# Patient Record
Sex: Male | Born: 1955
Health system: Southern US, Community
[De-identification: ages and names within clinical notes are randomized; demographics above are authoritative.]

## PROBLEM LIST (undated history)

## (undated) ENCOUNTER — Ambulatory Visit (HOSPITAL_COMMUNITY): Admission: EM

## (undated) DIAGNOSIS — I10 Essential (primary) hypertension: Secondary | ICD-10-CM

## (undated) DIAGNOSIS — L219 Seborrheic dermatitis, unspecified: Secondary | ICD-10-CM

## (undated) DIAGNOSIS — M545 Low back pain, unspecified: Secondary | ICD-10-CM

## (undated) DIAGNOSIS — M1990 Unspecified osteoarthritis, unspecified site: Secondary | ICD-10-CM

## (undated) DIAGNOSIS — B029 Zoster without complications: Secondary | ICD-10-CM

## (undated) DIAGNOSIS — Z87442 Personal history of urinary calculi: Secondary | ICD-10-CM

## (undated) DIAGNOSIS — M5416 Radiculopathy, lumbar region: Secondary | ICD-10-CM

## (undated) DIAGNOSIS — G894 Chronic pain syndrome: Secondary | ICD-10-CM

## (undated) DIAGNOSIS — M79604 Pain in right leg: Secondary | ICD-10-CM

## (undated) DIAGNOSIS — F1491 Cocaine use, unspecified, in remission: Secondary | ICD-10-CM

## (undated) DIAGNOSIS — E7800 Pure hypercholesterolemia, unspecified: Secondary | ICD-10-CM

## (undated) DIAGNOSIS — Z87898 Personal history of other specified conditions: Secondary | ICD-10-CM

## (undated) DIAGNOSIS — I2510 Atherosclerotic heart disease of native coronary artery without angina pectoris: Secondary | ICD-10-CM

## (undated) HISTORY — PX: KNEE ARTHROSCOPY: SUR90

## (undated) HISTORY — DX: Unspecified osteoarthritis, unspecified site: M19.90

---
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
## 2001-02-03 ENCOUNTER — Emergency Department (HOSPITAL_COMMUNITY): Admission: EM | Admit: 2001-02-03 | Discharge: 2001-02-03 | Payer: Self-pay | Admitting: Emergency Medicine

## 2001-02-21 ENCOUNTER — Emergency Department (HOSPITAL_COMMUNITY): Admission: EM | Admit: 2001-02-21 | Discharge: 2001-02-21 | Payer: Self-pay | Admitting: Emergency Medicine

## 2001-12-11 ENCOUNTER — Emergency Department (HOSPITAL_COMMUNITY): Admission: EM | Admit: 2001-12-11 | Discharge: 2001-12-11 | Payer: Self-pay | Admitting: Emergency Medicine

## 2002-09-28 ENCOUNTER — Emergency Department (HOSPITAL_COMMUNITY): Admission: EM | Admit: 2002-09-28 | Discharge: 2002-09-28 | Payer: Self-pay | Admitting: Emergency Medicine

## 2002-09-30 ENCOUNTER — Emergency Department (HOSPITAL_COMMUNITY): Admission: EM | Admit: 2002-09-30 | Discharge: 2002-09-30 | Payer: Self-pay | Admitting: *Deleted

## 2003-08-22 ENCOUNTER — Emergency Department (HOSPITAL_COMMUNITY): Admission: EM | Admit: 2003-08-22 | Discharge: 2003-08-22 | Payer: Self-pay | Admitting: Emergency Medicine

## 2004-12-31 ENCOUNTER — Emergency Department (HOSPITAL_COMMUNITY): Admission: EM | Admit: 2004-12-31 | Discharge: 2004-12-31 | Payer: Self-pay | Admitting: Emergency Medicine

## 2005-05-15 ENCOUNTER — Emergency Department (HOSPITAL_COMMUNITY): Admission: EM | Admit: 2005-05-15 | Discharge: 2005-05-15 | Payer: Self-pay | Admitting: Emergency Medicine

## 2006-05-23 ENCOUNTER — Emergency Department (HOSPITAL_COMMUNITY): Admission: EM | Admit: 2006-05-23 | Discharge: 2006-05-23 | Payer: Self-pay | Admitting: Emergency Medicine

## 2006-12-24 ENCOUNTER — Emergency Department (HOSPITAL_COMMUNITY): Admission: EM | Admit: 2006-12-24 | Discharge: 2006-12-24 | Payer: Self-pay | Admitting: Emergency Medicine

## 2007-01-11 ENCOUNTER — Emergency Department (HOSPITAL_COMMUNITY): Admission: EM | Admit: 2007-01-11 | Discharge: 2007-01-11 | Payer: Self-pay | Admitting: Emergency Medicine

## 2007-08-24 ENCOUNTER — Emergency Department (HOSPITAL_COMMUNITY): Admission: EM | Admit: 2007-08-24 | Discharge: 2007-08-24 | Payer: Self-pay | Admitting: Emergency Medicine

## 2008-08-06 ENCOUNTER — Ambulatory Visit: Payer: Self-pay | Admitting: Internal Medicine

## 2008-08-08 ENCOUNTER — Ambulatory Visit: Payer: Self-pay | Admitting: Internal Medicine

## 2008-08-25 ENCOUNTER — Ambulatory Visit: Payer: Self-pay | Admitting: Internal Medicine

## 2008-08-25 ENCOUNTER — Encounter: Payer: Self-pay | Admitting: Family Medicine

## 2008-08-25 LAB — CONVERTED CEMR LAB
ALT: 35 units/L (ref 0–53)
AST: 20 units/L (ref 0–37)
Alkaline Phosphatase: 72 units/L (ref 39–117)
Basophils Absolute: 0 10*3/uL (ref 0.0–0.1)
Eosinophils Absolute: 0.2 10*3/uL (ref 0.0–0.7)
Eosinophils Relative: 2 % (ref 0–5)
HCT: 53.4 % — ABNORMAL HIGH (ref 39.0–52.0)
MCV: 89 fL (ref 78.0–100.0)
Neutrophils Relative %: 69 % (ref 43–77)
Platelets: 228 10*3/uL (ref 150–400)
Potassium: 4.2 meq/L (ref 3.5–5.3)
RDW: 14.5 % (ref 11.5–15.5)
Sodium: 138 meq/L (ref 135–145)
Total Bilirubin: 0.3 mg/dL (ref 0.3–1.2)
Total Protein: 7.4 g/dL (ref 6.0–8.3)

## 2008-09-05 ENCOUNTER — Ambulatory Visit: Payer: Self-pay | Admitting: Internal Medicine

## 2008-10-02 ENCOUNTER — Ambulatory Visit: Payer: Self-pay | Admitting: Family Medicine

## 2008-10-07 ENCOUNTER — Ambulatory Visit: Payer: Self-pay | Admitting: Family Medicine

## 2008-10-07 ENCOUNTER — Encounter: Payer: Self-pay | Admitting: Family Medicine

## 2008-10-07 LAB — CONVERTED CEMR LAB
Albumin: 4.5 g/dL (ref 3.5–5.2)
Alkaline Phosphatase: 66 units/L (ref 39–117)
Amphetamine Screen, Ur: NEGATIVE
Barbiturate Quant, Ur: NEGATIVE
CO2: 26 meq/L (ref 19–32)
Calcium: 9.2 mg/dL (ref 8.4–10.5)
Chloride: 106 meq/L (ref 96–112)
Cholesterol: 170 mg/dL (ref 0–200)
Cocaine Metabolites: POSITIVE — AB
Glucose, Bld: 107 mg/dL — ABNORMAL HIGH (ref 70–99)
LDL Cholesterol: 107 mg/dL — ABNORMAL HIGH (ref 0–99)
Potassium: 4.4 meq/L (ref 3.5–5.3)
Sodium: 140 meq/L (ref 135–145)
Total Protein: 6.7 g/dL (ref 6.0–8.3)
Triglycerides: 140 mg/dL (ref <150)

## 2008-10-13 ENCOUNTER — Ambulatory Visit: Payer: Self-pay | Admitting: Family Medicine

## 2008-11-11 ENCOUNTER — Ambulatory Visit: Payer: Self-pay | Admitting: Family Medicine

## 2008-12-04 ENCOUNTER — Emergency Department (HOSPITAL_COMMUNITY): Admission: EM | Admit: 2008-12-04 | Discharge: 2008-12-05 | Payer: Self-pay | Admitting: Emergency Medicine

## 2008-12-09 ENCOUNTER — Ambulatory Visit: Payer: Self-pay | Admitting: Internal Medicine

## 2008-12-16 ENCOUNTER — Ambulatory Visit (HOSPITAL_COMMUNITY): Admission: RE | Admit: 2008-12-16 | Discharge: 2008-12-16 | Payer: Self-pay | Admitting: Internal Medicine

## 2008-12-24 ENCOUNTER — Ambulatory Visit: Payer: Self-pay | Admitting: Internal Medicine

## 2009-04-22 ENCOUNTER — Ambulatory Visit: Payer: Self-pay | Admitting: Family Medicine

## 2009-04-22 ENCOUNTER — Encounter: Payer: Self-pay | Admitting: Family Medicine

## 2009-04-22 LAB — CONVERTED CEMR LAB
Albumin: 4.3 g/dL (ref 3.5–5.2)
CO2: 23 meq/L (ref 19–32)
Glucose, Bld: 106 mg/dL — ABNORMAL HIGH (ref 70–99)
Sodium: 144 meq/L (ref 135–145)
Total Bilirubin: 0.3 mg/dL (ref 0.3–1.2)
Total Protein: 6.5 g/dL (ref 6.0–8.3)

## 2009-08-19 ENCOUNTER — Ambulatory Visit: Payer: Self-pay | Admitting: Internal Medicine

## 2009-09-28 ENCOUNTER — Ambulatory Visit: Payer: Self-pay | Admitting: Internal Medicine

## 2009-09-28 LAB — CONVERTED CEMR LAB
Cholesterol: 190 mg/dL (ref 0–200)
HDL: 42 mg/dL (ref >39)
Potassium: 4.2 meq/L (ref 3.5–5.3)
Sodium: 135 meq/L (ref 135–145)
Total CHOL/HDL Ratio: 4.5
Triglycerides: 153 mg/dL — ABNORMAL HIGH (ref <150)
VLDL: 31 mg/dL (ref 0–40)

## 2009-09-30 ENCOUNTER — Ambulatory Visit: Payer: Self-pay | Admitting: Internal Medicine

## 2010-02-17 ENCOUNTER — Ambulatory Visit: Payer: Self-pay | Admitting: Internal Medicine

## 2010-02-17 LAB — CONVERTED CEMR LAB
BUN: 19 mg/dL (ref 6–23)
Calcium: 9.8 mg/dL (ref 8.4–10.5)
Cholesterol: 207 mg/dL — ABNORMAL HIGH (ref 0–200)
Glucose, Bld: 111 mg/dL — ABNORMAL HIGH (ref 70–99)

## 2010-02-23 ENCOUNTER — Ambulatory Visit: Payer: Self-pay | Admitting: Internal Medicine

## 2010-03-07 ENCOUNTER — Emergency Department (HOSPITAL_COMMUNITY): Admission: EM | Admit: 2010-03-07 | Discharge: 2010-03-07 | Payer: Self-pay | Admitting: Emergency Medicine

## 2010-05-17 ENCOUNTER — Ambulatory Visit: Payer: Self-pay | Admitting: Internal Medicine

## 2010-07-29 ENCOUNTER — Emergency Department (HOSPITAL_COMMUNITY)
Admission: EM | Admit: 2010-07-29 | Discharge: 2010-07-30 | Payer: Self-pay | Source: Home / Self Care | Admitting: Emergency Medicine

## 2010-11-18 ENCOUNTER — Emergency Department (HOSPITAL_COMMUNITY)
Admission: EM | Admit: 2010-11-18 | Discharge: 2010-11-18 | Disposition: A | Discharge status: Home / Self Care | Payer: Medicare Other | Attending: Emergency Medicine | Admitting: Emergency Medicine

## 2010-11-18 DIAGNOSIS — E119 Type 2 diabetes mellitus without complications: Secondary | ICD-10-CM | POA: Insufficient documentation

## 2010-11-18 DIAGNOSIS — Z79899 Other long term (current) drug therapy: Secondary | ICD-10-CM | POA: Insufficient documentation

## 2010-11-18 DIAGNOSIS — J3489 Other specified disorders of nose and nasal sinuses: Secondary | ICD-10-CM | POA: Insufficient documentation

## 2010-11-18 DIAGNOSIS — F17200 Nicotine dependence, unspecified, uncomplicated: Secondary | ICD-10-CM | POA: Insufficient documentation

## 2010-11-18 DIAGNOSIS — M549 Dorsalgia, unspecified: Secondary | ICD-10-CM | POA: Insufficient documentation

## 2010-11-18 DIAGNOSIS — M129 Arthropathy, unspecified: Secondary | ICD-10-CM | POA: Insufficient documentation

## 2010-11-18 DIAGNOSIS — L989 Disorder of the skin and subcutaneous tissue, unspecified: Secondary | ICD-10-CM | POA: Insufficient documentation

## 2010-11-18 DIAGNOSIS — R059 Cough, unspecified: Secondary | ICD-10-CM | POA: Insufficient documentation

## 2010-11-18 DIAGNOSIS — R05 Cough: Secondary | ICD-10-CM | POA: Insufficient documentation

## 2010-11-18 LAB — GLUCOSE, CAPILLARY: Glucose-Capillary: 167 mg/dL — ABNORMAL HIGH (ref 70–99)

## 2010-11-29 ENCOUNTER — Other Ambulatory Visit: Payer: Self-pay | Admitting: Internal Medicine

## 2010-11-29 ENCOUNTER — Ambulatory Visit (HOSPITAL_COMMUNITY)
Admission: RE | Admit: 2010-11-29 | Discharge: 2010-11-29 | Disposition: A | Discharge status: Home / Self Care | Payer: Medicare Other | Source: Ambulatory Visit | Attending: Internal Medicine | Admitting: Internal Medicine

## 2010-11-29 DIAGNOSIS — R0602 Shortness of breath: Secondary | ICD-10-CM | POA: Insufficient documentation

## 2010-11-29 DIAGNOSIS — R059 Cough, unspecified: Secondary | ICD-10-CM | POA: Insufficient documentation

## 2010-11-29 DIAGNOSIS — E119 Type 2 diabetes mellitus without complications: Secondary | ICD-10-CM | POA: Insufficient documentation

## 2010-11-29 DIAGNOSIS — R071 Chest pain on breathing: Secondary | ICD-10-CM | POA: Insufficient documentation

## 2010-11-29 DIAGNOSIS — R05 Cough: Secondary | ICD-10-CM | POA: Insufficient documentation

## 2011-01-04 ENCOUNTER — Emergency Department (HOSPITAL_COMMUNITY): Payer: Medicare Other

## 2011-01-04 ENCOUNTER — Emergency Department (HOSPITAL_COMMUNITY)
Admission: EM | Admit: 2011-01-04 | Discharge: 2011-01-04 | Disposition: A | Discharge status: Home / Self Care | Payer: Medicare Other | Attending: Emergency Medicine | Admitting: Emergency Medicine

## 2011-01-04 DIAGNOSIS — IMO0002 Reserved for concepts with insufficient information to code with codable children: Secondary | ICD-10-CM | POA: Insufficient documentation

## 2011-01-04 DIAGNOSIS — W1809XA Striking against other object with subsequent fall, initial encounter: Secondary | ICD-10-CM | POA: Insufficient documentation

## 2011-01-04 DIAGNOSIS — E119 Type 2 diabetes mellitus without complications: Secondary | ICD-10-CM | POA: Insufficient documentation

## 2011-01-04 DIAGNOSIS — R071 Chest pain on breathing: Secondary | ICD-10-CM | POA: Insufficient documentation

## 2011-01-04 DIAGNOSIS — Z79899 Other long term (current) drug therapy: Secondary | ICD-10-CM | POA: Insufficient documentation

## 2011-01-07 ENCOUNTER — Emergency Department (HOSPITAL_COMMUNITY): Payer: Medicare Other

## 2011-01-07 ENCOUNTER — Emergency Department (HOSPITAL_COMMUNITY)
Admission: EM | Admit: 2011-01-07 | Discharge: 2011-01-07 | Disposition: A | Discharge status: Home / Self Care | Payer: Medicare Other | Attending: Emergency Medicine | Admitting: Emergency Medicine

## 2011-01-07 DIAGNOSIS — W19XXXA Unspecified fall, initial encounter: Secondary | ICD-10-CM | POA: Insufficient documentation

## 2011-01-07 DIAGNOSIS — S20219A Contusion of unspecified front wall of thorax, initial encounter: Secondary | ICD-10-CM | POA: Insufficient documentation

## 2011-01-07 DIAGNOSIS — R059 Cough, unspecified: Secondary | ICD-10-CM | POA: Insufficient documentation

## 2011-01-07 DIAGNOSIS — E119 Type 2 diabetes mellitus without complications: Secondary | ICD-10-CM | POA: Insufficient documentation

## 2011-01-07 DIAGNOSIS — Z79899 Other long term (current) drug therapy: Secondary | ICD-10-CM | POA: Insufficient documentation

## 2011-01-07 DIAGNOSIS — R05 Cough: Secondary | ICD-10-CM | POA: Insufficient documentation

## 2011-01-07 DIAGNOSIS — E669 Obesity, unspecified: Secondary | ICD-10-CM | POA: Insufficient documentation

## 2011-01-07 DIAGNOSIS — J3489 Other specified disorders of nose and nasal sinuses: Secondary | ICD-10-CM | POA: Insufficient documentation

## 2011-01-07 DIAGNOSIS — J40 Bronchitis, not specified as acute or chronic: Secondary | ICD-10-CM | POA: Insufficient documentation

## 2011-01-07 DIAGNOSIS — R071 Chest pain on breathing: Secondary | ICD-10-CM | POA: Insufficient documentation

## 2011-01-07 LAB — CBC
HCT: 47.4 % (ref 39.0–52.0)
Hemoglobin: 15.8 g/dL (ref 13.0–17.0)
RDW: 13.7 % (ref 11.5–15.5)
WBC: 12.7 10*3/uL — ABNORMAL HIGH (ref 4.0–10.5)

## 2011-01-07 LAB — DIFFERENTIAL
Basophils Absolute: 0 10*3/uL (ref 0.0–0.1)
Basophils Relative: 0 % (ref 0–1)
Eosinophils Relative: 1 % (ref 0–5)
Lymphocytes Relative: 9 % — ABNORMAL LOW (ref 12–46)
Neutro Abs: 10.3 10*3/uL — ABNORMAL HIGH (ref 1.7–7.7)

## 2011-01-07 LAB — POCT I-STAT, CHEM 8
Chloride: 107 mEq/L (ref 96–112)
Glucose, Bld: 93 mg/dL (ref 70–99)
HCT: 49 % (ref 39.0–52.0)
Potassium: 4.1 mEq/L (ref 3.5–5.1)
Sodium: 140 mEq/L (ref 135–145)

## 2011-01-08 ENCOUNTER — Emergency Department (HOSPITAL_COMMUNITY)
Admission: EM | Admit: 2011-01-08 | Discharge: 2011-01-09 | Disposition: A | Discharge status: Home / Self Care | Payer: Medicare Other | Attending: Emergency Medicine | Admitting: Emergency Medicine

## 2011-01-08 ENCOUNTER — Emergency Department (HOSPITAL_COMMUNITY): Payer: Medicare Other

## 2011-01-08 DIAGNOSIS — R062 Wheezing: Secondary | ICD-10-CM | POA: Insufficient documentation

## 2011-01-08 DIAGNOSIS — E119 Type 2 diabetes mellitus without complications: Secondary | ICD-10-CM | POA: Insufficient documentation

## 2011-01-08 DIAGNOSIS — R093 Abnormal sputum: Secondary | ICD-10-CM | POA: Insufficient documentation

## 2011-01-08 DIAGNOSIS — R05 Cough: Secondary | ICD-10-CM | POA: Insufficient documentation

## 2011-01-08 DIAGNOSIS — Z79899 Other long term (current) drug therapy: Secondary | ICD-10-CM | POA: Insufficient documentation

## 2011-01-08 DIAGNOSIS — R059 Cough, unspecified: Secondary | ICD-10-CM | POA: Insufficient documentation

## 2011-01-17 ENCOUNTER — Emergency Department (HOSPITAL_COMMUNITY)
Admission: EM | Admit: 2011-01-17 | Discharge: 2011-01-17 | Disposition: A | Discharge status: Home / Self Care | Payer: Medicare Other | Attending: Emergency Medicine | Admitting: Emergency Medicine

## 2011-01-17 DIAGNOSIS — Z79899 Other long term (current) drug therapy: Secondary | ICD-10-CM | POA: Insufficient documentation

## 2011-01-17 DIAGNOSIS — W19XXXA Unspecified fall, initial encounter: Secondary | ICD-10-CM | POA: Insufficient documentation

## 2011-01-17 DIAGNOSIS — S20219A Contusion of unspecified front wall of thorax, initial encounter: Secondary | ICD-10-CM | POA: Insufficient documentation

## 2011-03-21 ENCOUNTER — Emergency Department (HOSPITAL_COMMUNITY): Payer: Medicare Other

## 2011-03-21 ENCOUNTER — Emergency Department (HOSPITAL_COMMUNITY)
Admission: EM | Admit: 2011-03-21 | Discharge: 2011-03-21 | Disposition: A | Discharge status: Home / Self Care | Payer: Medicare Other | Attending: Emergency Medicine | Admitting: Emergency Medicine

## 2011-03-21 DIAGNOSIS — Z79899 Other long term (current) drug therapy: Secondary | ICD-10-CM | POA: Insufficient documentation

## 2011-03-21 DIAGNOSIS — R079 Chest pain, unspecified: Secondary | ICD-10-CM | POA: Insufficient documentation

## 2011-03-21 DIAGNOSIS — E119 Type 2 diabetes mellitus without complications: Secondary | ICD-10-CM | POA: Insufficient documentation

## 2011-04-01 LAB — URINE MICROSCOPIC-ADD ON

## 2011-04-01 LAB — I-STAT 8, (EC8 V) (CONVERTED LAB)
BUN: 15
Bicarbonate: 27.4 — ABNORMAL HIGH
Glucose, Bld: 262 — ABNORMAL HIGH
TCO2: 29
pCO2, Ven: 47.6
pH, Ven: 7.368 — ABNORMAL HIGH

## 2011-04-01 LAB — URINALYSIS, ROUTINE W REFLEX MICROSCOPIC
Leukocytes, UA: NEGATIVE
Nitrite: NEGATIVE
Protein, ur: 30 — AB
Specific Gravity, Urine: 1.034 — ABNORMAL HIGH
Urobilinogen, UA: 0.2

## 2011-04-01 LAB — POCT I-STAT CREATININE: Creatinine, Ser: 1

## 2011-04-08 ENCOUNTER — Emergency Department (HOSPITAL_COMMUNITY): Payer: Medicare Other

## 2011-04-08 ENCOUNTER — Emergency Department (HOSPITAL_COMMUNITY)
Admission: EM | Admit: 2011-04-08 | Discharge: 2011-04-08 | Disposition: A | Discharge status: Home / Self Care | Payer: Medicare Other | Attending: Emergency Medicine | Admitting: Emergency Medicine

## 2011-04-08 DIAGNOSIS — X500XXA Overexertion from strenuous movement or load, initial encounter: Secondary | ICD-10-CM | POA: Insufficient documentation

## 2011-04-08 DIAGNOSIS — Y92009 Unspecified place in unspecified non-institutional (private) residence as the place of occurrence of the external cause: Secondary | ICD-10-CM | POA: Insufficient documentation

## 2011-04-08 DIAGNOSIS — IMO0002 Reserved for concepts with insufficient information to code with codable children: Secondary | ICD-10-CM | POA: Insufficient documentation

## 2011-04-08 DIAGNOSIS — M25569 Pain in unspecified knee: Secondary | ICD-10-CM | POA: Insufficient documentation

## 2011-04-08 DIAGNOSIS — E669 Obesity, unspecified: Secondary | ICD-10-CM | POA: Insufficient documentation

## 2011-04-08 DIAGNOSIS — E119 Type 2 diabetes mellitus without complications: Secondary | ICD-10-CM | POA: Insufficient documentation

## 2011-04-11 LAB — GLUCOSE, CAPILLARY: Glucose-Capillary: 124 mg/dL — ABNORMAL HIGH (ref 70–99)

## 2011-07-21 DIAGNOSIS — M25569 Pain in unspecified knee: Secondary | ICD-10-CM | POA: Diagnosis not present

## 2011-07-25 DIAGNOSIS — E119 Type 2 diabetes mellitus without complications: Secondary | ICD-10-CM | POA: Diagnosis not present

## 2011-07-25 DIAGNOSIS — E669 Obesity, unspecified: Secondary | ICD-10-CM | POA: Diagnosis not present

## 2011-07-25 DIAGNOSIS — J449 Chronic obstructive pulmonary disease, unspecified: Secondary | ICD-10-CM | POA: Diagnosis not present

## 2011-07-25 DIAGNOSIS — R05 Cough: Secondary | ICD-10-CM | POA: Diagnosis not present

## 2011-07-28 DIAGNOSIS — IMO0002 Reserved for concepts with insufficient information to code with codable children: Secondary | ICD-10-CM | POA: Diagnosis not present

## 2011-08-04 DIAGNOSIS — J45909 Unspecified asthma, uncomplicated: Secondary | ICD-10-CM | POA: Diagnosis not present

## 2011-08-04 DIAGNOSIS — E119 Type 2 diabetes mellitus without complications: Secondary | ICD-10-CM | POA: Diagnosis not present

## 2011-08-22 DIAGNOSIS — X58XXXA Exposure to other specified factors, initial encounter: Secondary | ICD-10-CM | POA: Diagnosis not present

## 2011-08-22 DIAGNOSIS — M23305 Other meniscus derangements, unspecified medial meniscus, unspecified knee: Secondary | ICD-10-CM | POA: Diagnosis not present

## 2011-08-22 DIAGNOSIS — Y929 Unspecified place or not applicable: Secondary | ICD-10-CM | POA: Diagnosis not present

## 2011-08-22 DIAGNOSIS — IMO0002 Reserved for concepts with insufficient information to code with codable children: Secondary | ICD-10-CM | POA: Diagnosis not present

## 2011-08-22 DIAGNOSIS — M659 Synovitis and tenosynovitis, unspecified: Secondary | ICD-10-CM | POA: Diagnosis not present

## 2011-09-04 ENCOUNTER — Encounter (HOSPITAL_COMMUNITY): Payer: Self-pay | Admitting: *Deleted

## 2011-09-04 ENCOUNTER — Emergency Department (HOSPITAL_COMMUNITY)
Admission: EM | Admit: 2011-09-04 | Discharge: 2011-09-04 | Disposition: A | Discharge status: Home / Self Care | Payer: Medicare Other | Attending: Emergency Medicine | Admitting: Emergency Medicine

## 2011-09-04 DIAGNOSIS — H579 Unspecified disorder of eye and adnexa: Secondary | ICD-10-CM | POA: Insufficient documentation

## 2011-09-04 DIAGNOSIS — E119 Type 2 diabetes mellitus without complications: Secondary | ICD-10-CM | POA: Insufficient documentation

## 2011-09-04 DIAGNOSIS — H5789 Other specified disorders of eye and adnexa: Secondary | ICD-10-CM

## 2011-09-04 MED ORDER — FLUORESCEIN SODIUM 1 MG OP STRP
ORAL_STRIP | OPHTHALMIC | Status: AC
  Filled 2011-09-04: qty 1

## 2011-09-04 MED ORDER — TETRACAINE HCL 0.5 % OP SOLN
OPHTHALMIC | Status: AC
  Filled 2011-09-04: qty 2

## 2011-09-04 NOTE — Discharge Instructions (Signed)
There are no foreign bodies in your eye.  Please call your eye doctor as soon as possible for a follow up for your irritation.

## 2011-09-04 NOTE — ED Provider Notes (Signed)
I saw and evaluated the patient, reviewed the resident's note and I agree with the findings and plan.   .Face to face Exam:  General:  Awake HEENT:  Atraumatic Resp:  Normal effort Abd:  Nondistended Neuro:No focal weakness Lymph: No adenopathy   Nelia Shi, MD 09/04/11 2248

## 2011-09-04 NOTE — ED Provider Notes (Signed)
History     CSN: [id number]  Arrival date & time 09/04/11  8469   First MD Initiated Contact with Patient 09/04/11 0900      Chief Complaint  Patient presents with  . Eye Problem    right eye irritation    HPI Pt comes in with right eye irritation for 4 days.  He says he feels like there is something in the eye, and he has tried eye drops and tried to wash the eye out, but it has been getting worse, not better. He denies any injury to the eye, and denies itching.   He says that he had this several months ago, and went to [facility], and they said he had a scratch on his eye and prescribed eye drops.  He says the eye drops stung.  He saw an eye doctor about 6 months ago who said he needed reading glasses   Past Medical History  Diagnosis Date  . Diabetes mellitus     Past Surgical History  Procedure Date  . Knee surgrery     No family history on file.  History  Substance Use Topics  . Smoking status: Current Everyday Smoker  . Smokeless tobacco: Not on file  . Alcohol Use: Yes     occ      Review of Systems  Constitutional: Negative for fever.  HENT: Negative for congestion.   Eyes: Positive for pain, discharge and redness. Negative for itching and visual disturbance.  Respiratory: Negative for shortness of breath.   Cardiovascular: Negative for chest pain.  Gastrointestinal: Negative for abdominal pain.  Genitourinary: Negative for dysuria.  Musculoskeletal: Positive for arthralgias.  Skin: Negative for rash.  Neurological: Negative for dizziness.    Allergies  Review of patient's allergies indicates no known allergies.  Home Medications   Current Outpatient Rx  Name Route Sig Dispense Refill  . GLIPIZIDE 10 MG PO TABS Oral Take 10 mg by mouth 2 (two) times daily before a meal.    . METFORMIN HCL 1000 MG PO TABS Oral Take 1,000 mg by mouth 2 (two) times daily with a meal.    . METHOCARBAMOL 500 MG PO TABS Oral Take 500 mg by mouth every 6 (six) hours.     . OXYCODONE-ACETAMINOPHEN 5-325 MG PO TABS Oral Take 1 tablet by mouth every 4 (four) hours as needed. For pain.    Marland Kitchen SALSALATE 500 MG PO TABS Oral Take 500 mg by mouth daily.      BP 142/94  Pulse 75  Temp(Src) 97 F (36.1 C) (Oral)  Resp 18  SpO2 97%  Physical Exam  Constitutional: He appears well-developed and well-nourished. No distress.  HENT:  Head: Normocephalic and atraumatic.  Nose: Nose normal.  Mouth/Throat: Oropharynx is clear and moist.  Eyes: EOM are normal. Pupils are equal, round, and reactive to light. No foreign bodies found. Right conjunctiva is injected. No scleral icterus.  Slit lamp exam:      The right eye shows no fluorescein uptake.       The left eye shows no fluorescein uptake.       Pt has some granulation tissue in medial right eye mucosa.  No stye or other infection noted.     ED Course  Procedures (including critical care time)  Labs Reviewed - No data to display No results found.   1. Eye irritation       MDM  No corneal abrasion or foreign body seen.  Will have pt follow  up with his eye doctor.         Ardyth Gal, MD 09/04/11 1016

## 2011-09-04 NOTE — ED Notes (Signed)
Pt is here with right eye irritation and was seen at [facility] and diagnosed with scratch on eye.  Pt saw [facility] a couple of months ago.  Pt sts irritation restarted and been going on for last 4 days. No vision change.  Pt feels like something is moving around in his eye.  No redness noted and pupils 4 round reactive and brisk

## 2011-10-11 DIAGNOSIS — H04129 Dry eye syndrome of unspecified lacrimal gland: Secondary | ICD-10-CM | POA: Diagnosis not present

## 2011-12-23 DIAGNOSIS — E669 Obesity, unspecified: Secondary | ICD-10-CM | POA: Diagnosis not present

## 2011-12-23 DIAGNOSIS — J45909 Unspecified asthma, uncomplicated: Secondary | ICD-10-CM | POA: Diagnosis not present

## 2011-12-23 DIAGNOSIS — E119 Type 2 diabetes mellitus without complications: Secondary | ICD-10-CM | POA: Diagnosis not present

## 2011-12-23 DIAGNOSIS — F17200 Nicotine dependence, unspecified, uncomplicated: Secondary | ICD-10-CM | POA: Diagnosis not present

## 2012-07-05 ENCOUNTER — Emergency Department (INDEPENDENT_AMBULATORY_CARE_PROVIDER_SITE_OTHER)
Admission: EM | Admit: 2012-07-05 | Discharge: 2012-07-05 | Disposition: A | Discharge status: Home / Self Care | Payer: Medicare Other | Source: Home / Self Care

## 2012-07-05 ENCOUNTER — Encounter (HOSPITAL_COMMUNITY): Payer: Self-pay

## 2012-07-05 DIAGNOSIS — J45909 Unspecified asthma, uncomplicated: Secondary | ICD-10-CM

## 2012-07-05 DIAGNOSIS — M549 Dorsalgia, unspecified: Secondary | ICD-10-CM

## 2012-07-05 DIAGNOSIS — E119 Type 2 diabetes mellitus without complications: Secondary | ICD-10-CM

## 2012-07-05 LAB — HEMOGLOBIN A1C: Hgb A1c MFr Bld: 7.3 % — ABNORMAL HIGH (ref <5.7)

## 2012-07-05 MED ORDER — ALBUTEROL SULFATE HFA 108 (90 BASE) MCG/ACT IN AERS: 2.0000 | INHALATION_SPRAY | Freq: Four times a day (QID) | RESPIRATORY_TRACT | Status: DC | PRN

## 2012-07-05 MED ORDER — FREESTYLE LANCETS MISC: Status: DC

## 2012-07-05 MED ORDER — GLIPIZIDE 10 MG PO TABS: 10.0000 mg | ORAL_TABLET | Freq: Two times a day (BID) | ORAL | Status: DC

## 2012-07-05 MED ORDER — METFORMIN HCL 1000 MG PO TABS: 1000.0000 mg | ORAL_TABLET | Freq: Two times a day (BID) | ORAL | Status: DC

## 2012-07-05 MED ORDER — TEARS RENEWED OP SOLN: 1.0000 [drp] | Freq: Three times a day (TID) | OPHTHALMIC | Status: DC | PRN

## 2012-07-05 MED ORDER — TRAMADOL HCL 50 MG PO TABS: 50.0000 mg | ORAL_TABLET | Freq: Three times a day (TID) | ORAL | Status: DC | PRN

## 2012-07-05 MED ORDER — GLUCOSE BLOOD VI STRP: ORAL_STRIP | Status: DC

## 2012-07-05 MED ORDER — ACETAMINOPHEN 500 MG PO TABS: 500.0000 mg | ORAL_TABLET | Freq: Four times a day (QID) | ORAL | Status: DC | PRN

## 2012-07-05 NOTE — ED Provider Notes (Signed)
History     CSN: [id number]  Arrival date & time 07/05/12  1225   First MD Initiated Contact with Patient 07/05/12 1358      Chief Complaint  Patient presents with  . Medication Refill    (Consider location/radiation/quality/duration/timing/severity/associated sxs/prior treatment) HPI Patient came to the clinic today for medication refill. Patient has chronic back pain and has been taking tramadol and salsalate. He says he has been taking salsalate for 2 years. Also he has been taking ketorolac ophthalmic for last 2 years for scar in the eye. Patient was seen by ophthalmologist 6 months ago at that time he was told that he does not have any eye problem.  Past Medical History  Diagnosis Date  . Diabetes mellitus     Past Surgical History  Procedure Date  . Knee surgrery     No family history on file.  History  Substance Use Topics  . Smoking status: Current Every Day Smoker  . Smokeless tobacco: Not on file  . Alcohol Use: Yes     Comment: occ      Review of Systems   Review of Systems:  HEENT: Denies  blurred vision, runny nose, sore throat,  Neck: Denies thyroid problems,lymphadenopathy Chest : Denies shortness of breath, no history of COPD Heart : Denies Chest pain,  coronary arterey disease            Allergies  Review of patient's allergies indicates no known allergies.  Home Medications   Current Outpatient Rx  Name  Route  Sig  Dispense  Refill  . METHOCARBAMOL 500 MG PO TABS   Oral   Take 500 mg by mouth every 6 (six) hours.         . ACETAMINOPHEN 500 MG PO TABS   Oral   Take 1 tablet (500 mg total) by mouth every 6 (six) hours as needed for pain.   30 tablet   2   . ALBUTEROL SULFATE HFA 108 (90 BASE) MCG/ACT IN AERS   Inhalation   Inhale 2 puffs into the lungs every 6 (six) hours as needed for wheezing.   1 Inhaler   2   . GLIPIZIDE 10 MG PO TABS   Oral   Take 1 tablet (10 mg total) by mouth 2 (two) times daily before a  meal.   60 tablet   3   . METFORMIN HCL 1000 MG PO TABS   Oral   Take 1 tablet (1,000 mg total) by mouth 2 (two) times daily with a meal.   60 tablet   2   . TRAMADOL HCL 50 MG PO TABS   Oral   Take 1 tablet (50 mg total) by mouth 3 (three) times daily as needed.   30 tablet   1     BP 101/81  Pulse 72  Temp 97.4 F (36.3 C) (Oral)  Resp 18  SpO2 99%  Physical Exam    Constitutional:   Patient is  in no acute distress and cooperative with exam. Head: Normocephalic and atraumatic Mouth: Mucus membranes moist Eyes: PERRL, EOMI, conjunctivae normal Neck: Supple, No Thyromegaly Cardiovascular: RRR, S1 normal, S2 normal Pulmonary/Chest: CTAB, no wheezes, rales, or rhonchi   ED Course  Procedures (including critical care time)  Labs Reviewed - No data to display No results found.   1. Back pain   2. Diabetes mellitus   3. Asthma     back pain We'll give the prescription for tramadol. Will DC salsalate as  it can cause long-term GI side effects. Patient will be given prescription for Tylenol when necessary  Diabetes mellitus Will check hemoglobin A1c We'll give him prescriptions for metformin and glipizide  Eye scar Patient has been taking long-term ketorolac ophthalmic. His ophthalmologist did not recommend him take that, so I will discontinue that at this time Prescribing artificial tears   MDM

## 2012-07-05 NOTE — ED Notes (Signed)
Former [facility] client- i need of medication refills also complains of having head knee surgery 6 months ago now complains of having pain

## 2012-11-22 ENCOUNTER — Encounter: Payer: Self-pay | Admitting: Family Medicine

## 2012-11-22 ENCOUNTER — Ambulatory Visit: Payer: Medicare Other | Attending: Family Medicine | Admitting: Family Medicine

## 2012-11-22 VITALS — BP 127/79 | HR 73 | Temp 98.4°F | Ht 72.0 in | Wt 237.0 lb

## 2012-11-22 DIAGNOSIS — K029 Dental caries, unspecified: Secondary | ICD-10-CM

## 2012-11-22 DIAGNOSIS — G894 Chronic pain syndrome: Secondary | ICD-10-CM | POA: Diagnosis not present

## 2012-11-22 DIAGNOSIS — I10 Essential (primary) hypertension: Secondary | ICD-10-CM | POA: Insufficient documentation

## 2012-11-22 DIAGNOSIS — F17200 Nicotine dependence, unspecified, uncomplicated: Secondary | ICD-10-CM | POA: Diagnosis not present

## 2012-11-22 DIAGNOSIS — IMO0001 Reserved for inherently not codable concepts without codable children: Secondary | ICD-10-CM

## 2012-11-22 DIAGNOSIS — E119 Type 2 diabetes mellitus without complications: Secondary | ICD-10-CM | POA: Insufficient documentation

## 2012-11-22 LAB — COMPREHENSIVE METABOLIC PANEL
Alkaline Phosphatase: 85 U/L (ref 39–117)
BUN: 22 mg/dL (ref 6–23)
Glucose, Bld: 205 mg/dL — ABNORMAL HIGH (ref 70–99)
Sodium: 140 mEq/L (ref 135–145)
Total Bilirubin: 0.3 mg/dL (ref 0.3–1.2)

## 2012-11-22 LAB — LIPID PANEL
HDL: 35 mg/dL — ABNORMAL LOW (ref >39)
Triglycerides: 481 mg/dL — ABNORMAL HIGH (ref <150)

## 2012-11-22 MED ORDER — TRAMADOL HCL 50 MG PO TABS: 50.0000 mg | ORAL_TABLET | Freq: Three times a day (TID) | ORAL | Status: DC | PRN

## 2012-11-22 MED ORDER — GLIPIZIDE 10 MG PO TABS: 10.0000 mg | ORAL_TABLET | Freq: Two times a day (BID) | ORAL | Status: DC

## 2012-11-22 MED ORDER — METFORMIN HCL 1000 MG PO TABS: 1000.0000 mg | ORAL_TABLET | Freq: Two times a day (BID) | ORAL | Status: DC

## 2012-11-22 NOTE — Patient Instructions (Signed)
Chronic Pain Chronic pain can be defined as pain that is lasting, off and on, and lasts for 3 to 6 months or longer. Many things cause chronic pain, which can make it difficult to make a discrete diagnosis. There are many treatment options available for chronic pain. However, finding a treatment that works well for you may require trying various approaches until a suitable one is found. CAUSES  In some types of chronic medical conditions, the pain is caused by a normal pain response within the body. A normal pain response helps the body identify illness or injury and prevent further damage from being done. In these cases, the cause of the pain may be identified and treated, even if it may not be cured completely. Examples of chronic conditions which can cause chronic pain include:  Inflammation of the joints (arthritis).  Back pain or neck pain (including bulging or herniated disks).  Migraine headaches.  Cancer. In some other types of chronic pain syndromes, the pain is caused by an abnormal pain response within the body. An abnormal pain response is present when there is no ongoing cause (or stimulus) for the pain, or when the cause of the pain is arising from the nerves or nervous system itself. Examples of conditions which can cause chronic pain due to an abnormal pain response include:  Fibromyalgia.  Reflex sympathetic dystrophy (RSD).  Neuropathy (when the nerves themselves are damaged, and may cause pain). DIAGNOSIS  Your caregiver will help diagnose your condition over time. In many cases, the initial focus will be on excluding conditions that could be causing the pain. Depending on your symptoms, your caregiver may order some tests to diagnose your condition. Some of these tests include:  Blood tests.  Computerized X-ray scans (CT scan).  Computerized magnetic scans (MRI).  X-rays.  Ultrasounds.  Nerve conduction studies.  Consultation with other physicians or  specialists. TREATMENT  There are many treatment options for people suffering from chronic pain. Finding a treatment that works well may take time.   You may be referred to a pain management specialist.  You may be put on medication to help with the pain. Unfortunately, some medications (such as opiate medications) may not be very effective in cases where chronic pain is due to abnormal pain responses. Finding the right medications can take some time.  Adjunctive therapies may be used to provide additional relief and improve a patient's quality of life. These therapies include:  Mindfulness meditation.  Acupuncture.  Biofeedback.  Cognitive-behavioral therapy.  In certain cases, surgical interventions may be attempted. HOME CARE INSTRUCTIONS   Make sure you understand these instructions prior to discharge.  Ask any questions and share any further concerns you have with your caregiver prior to discharge.  Take all medications as directed by your caregiver.  Keep all follow-up appointments. SEEK MEDICAL CARE IF:   Your pain gets worse.  You develop a new pain that was not present before.  You cannot tolerate any medications prescribed by your caregiver.  You develop new symptoms since your last visit with your caregiver. SEEK IMMEDIATE MEDICAL CARE IF:   You develop muscular weakness.  You have decreased sensation or numbness.  You lose control of bowel or bladder function.  Your pain suddenly gets much worse.  You have an oral temperature above 102 F (38.9 C), not controlled by medication.  You develop shaking chills, confusion, chest pain, or shortness of breath. Document Released: 03/19/2002 Document Revised: 09/19/2011 Document Reviewed: 06/25/2008 [employer] Patient Information  [address], [place]. Blood Sugar Monitoring, Adult GLUCOSE METERS FOR SELF-MONITORING OF BLOOD GLUCOSE  It is important to be able to correctly measure your blood sugar (glucose). You  can use a blood glucose monitor (a small battery-operated device) to check your glucose level at any time. This allows you and your caregiver to monitor your diabetes and to determine how well your treatment plan is working. The process of monitoring your blood glucose with a glucose meter is called self-monitoring of blood glucose (SMBG). When people with diabetes control their blood sugar, they have better health. To test for glucose with a typical glucose meter, place the disposable strip in the meter. Then place a small sample of blood on the "test strip." The test strip is coated with chemicals that combine with glucose in blood. The meter measures how much glucose is present. The meter displays the glucose level as a number. Several new models can record and store a number of test results. Some models can connect to personal computers to store test results or print them out.  Newer meters are often easier to use than older models. Some meters allow you to get blood from places other than your fingertip. Some new models have automatic timing, error codes, signals, or barcode readers to help with proper adjustment (calibration). Some meters have a large display screen or spoken instructions for people with visual impairments.  INSTRUCTIONS FOR USING GLUCOSE METERS  Wash your hands with soap and warm water, or clean the area with alcohol. Dry your hands completely.  Prick the side of your fingertip with a lancet (a sharp-pointed tool used by hand).  Hold the hand down and gently milk the finger until a small drop of blood appears. Catch the blood with the test strip.  Follow the instructions for inserting the test strip and using the SMBG meter. Most meters require the meter to be turned on and the test strip to be inserted before applying the blood sample.  Record the test result.  Read the instructions carefully for both the meter and the test strips that go with it. Meter instructions are found  in the user manual. Keep this manual to help you solve any problems that may arise. Many meters use "error codes" when there is a problem with the meter, the test strip, or the blood sample on the strip. You will need the manual to understand these error codes and fix the problem.  New devices are available such as laser lancets and meters that can test blood taken from "alternative sites" of the body, other than fingertips. However, you should use standard fingertip testing if your glucose changes rapidly. Also, use standard testing if:  You have eaten, exercised, or taken insulin in the past 2 hours.  You think your glucose is low.  You tend to not feel symptoms of low blood glucose (hypoglycemia).  You are ill or under stress.  Clean the meter as directed by the manufacturer.  Test the meter for accuracy as directed by the manufacturer.  Take your meter with you to your caregiver's office. This way, you can test your glucose in front of your caregiver to make sure you are using the meter correctly. Your caregiver can also take a sample of blood to test using a routine lab method. If values on the glucose meter are close to the lab results, you and your caregiver will see that your meter is working well and you are using good technique. Your caregiver will  advise you about what to do if the results do not match. FREQUENCY OF TESTING  Your caregiver will tell you how often you should check your blood glucose. This will depend on your type of diabetes, your current level of diabetes control, and your types of medicines. The following are general guidelines, but your care plan may be different. Record all your readings and the time of day you took them for review with your caregiver.   Diabetes type 1.  When you are using insulin with good diabetic control (either multiple daily injections or via a pump), you should check your glucose 4 times a day.  If your diabetes is not well controlled,  you may need to monitor more frequently, including before meals and 2 hours after meals, at bedtime, and occasionally between 2 a.m. and 3 a.m.  You should always check your glucose before a dose of insulin or before changing the rate on your insulin pump.  Diabetes type 2.  Guidelines for SMBG in diabetes type 2 are not as well defined.  If you are on insulin, follow the guidelines above.  If you are on medicines, but not insulin, and your glucose is not well controlled, you should test at least twice daily.  If you are not on insulin, and your diabetes is controlled with medicines or diet alone, you should test at least once daily, usually before breakfast.  A weekly profile will help your caregiver advise you on your care plan. The week before your visit, check your glucose before a meal and 2 hours after a meal at least daily. You may want to test before and after a different meal each day so you and your caregiver can tell how well controlled your blood sugars are throughout the course of a 24 hour period.  Gestational diabetes (diabetes during pregnancy).  Frequent testing is often necessary. Accurate timing is important.  If you are not on insulin, check your glucose 4 times a day. Check it before breakfast and 1 hour after the start of each meal.  If you are on insulin, check your glucose 6 times a day. Check it before each meal and 1 hour after the first bite of each meal.  General guidelines.  More frequent testing is required at the start of insulin treatment. Your caregiver will instruct you.  Test your glucose any time you suspect you have low blood sugar (hypoglycemia).  You should test more often when you change medicines, when you have unusual stress or illness, or in other unusual circumstances. OTHER THINGS TO KNOW ABOUT GLUCOSE METERS  Measurement Range. Most glucose meters are able to read glucose levels over a broad range of values from as low as 0 to as high as  600 mg/dL. If you get an extremely high or low reading from your meter, you should first confirm it with another reading. Report very high or very low readings to your caregiver.  Whole Blood Glucose versus Plasma Glucose. Some older home glucose meters measure glucose in your whole blood. In a lab or when using some newer home glucose meters, the glucose is measured in your plasma (one component of blood). The difference can be important. It is important for you and your caregiver to know whether your meter gives its results as "whole blood equivalent" or "plasma equivalent."  Display of High and Low Glucose Values. Part of learning how to operate a meter is understanding what the meter results mean. Know how high and low  glucose concentrations are displayed on your meter.  Factors that Affect Glucose Meter Performance. The accuracy of your test results depends on many factors and varies depending on the brand and type of meter. These factors include:  Low red blood cell count (anemia).  Substances in your blood (such as uric acid, vitamin C, and others).  Environmental factors (temperature, humidity, altitude).  Name-brand versus generic test strips.  Calibration. Make sure your meter is set up properly. It is a good idea to do a calibration test with a control solution recommended by the manufacturer of your meter whenever you begin using a fresh bottle of test strips. This will help verify the accuracy of your meter.  Improperly stored, expired, or defective test strips. Keep your strips in a dry place with the lid on.  Soiled meter.  Inadequate blood sample. NEW TECHNOLOGIES FOR GLUCOSE TESTING Alternative site testing Some glucose meters allow testing blood from alternative sites. These include the:  Upper arm.  Forearm.  Base of the thumb.  Thigh. Sampling blood from alternative sites may be desirable. However, it may have some limitations. Blood in the fingertips show  changes in glucose levels more quickly than blood in other parts of the body. This means that alternative site test results may be different from fingertip test results, not because of the meter's ability to test accurately, but because the actual glucose concentration can be different.  Continuous Glucose Monitoring Devices to measure your blood glucose continuously are available, and others are in development. These methods can be more expensive than self-monitoring with a glucose meter. However, it is uncertain how effective and reliable these devices are. Your caregiver will advise you if this approach makes sense for you. IF BLOOD SUGARS ARE CONTROLLED, PEOPLE WITH DIABETES REMAIN HEALTHIER.  SMBG is an important part of the treatment plan of patients with diabetes mellitus. Below are reasons for using SMBG:   It confirms that your glucose is at a specific, healthy level.  It detects hypoglycemia and severe hyperglycemia.  It allows you and your caregiver to make adjustments in response to changes in lifestyle for individuals requiring medicine.  It determines the need for starting insulin therapy in temporary diabetes that happens during pregnancy (gestational diabetes). Document Released: 06/30/2003 Document Revised: 09/19/2011 Document Reviewed: 10/21/2010 [facility] Patient Information 2013 [place], [place].

## 2012-11-22 NOTE — Progress Notes (Signed)
Patient ID: Ryan Farmer, male   DOB: 1956-06-26, 57 y.o.   MRN: [id number] CC: follow up    HPI: Pt presenting for follow up and for diabetes check up. Pt says that BS 80-300.  No complaints except that pain is not controlled with tramadol once per day.  He says he normally takes it 3 times per day to control his pain and has done this for a long time.   No Known Allergies Past Medical History  Diagnosis Date  . Diabetes mellitus   . Arthritis    Current Outpatient Prescriptions on File Prior to Visit  Medication Sig Dispense Refill  . acetaminophen (TYLENOL) 500 MG tablet Take 1 tablet (500 mg total) by mouth every 6 (six) hours as needed for pain.  30 tablet  2  . albuterol (PROVENTIL HFA;VENTOLIN HFA) 108 (90 BASE) MCG/ACT inhaler Inhale 2 puffs into the lungs every 6 (six) hours as needed for wheezing.  1 Inhaler  2  . dextran 70-hypromellose (TEARS RENEWED) ophthalmic solution Place 1 drop into both eyes 3 (three) times daily as needed.  15 mL  3  . glipiZIDE (GLUCOTROL) 10 MG tablet Take 1 tablet (10 mg total) by mouth 2 (two) times daily before a meal.  60 tablet  3  . glucose blood (CHOICE DM FORA G20 TEST STRIPS) test strip Use as instructed  100 each  12  . Lancets (FREESTYLE) lancets Use as instructed  100 each  12  . metFORMIN (GLUCOPHAGE) 1000 MG tablet Take 1 tablet (1,000 mg total) by mouth 2 (two) times daily with a meal.  60 tablet  2  . methocarbamol (ROBAXIN) 500 MG tablet Take 500 mg by mouth every 6 (six) hours.      . traMADol (ULTRAM) 50 MG tablet Take 1 tablet (50 mg total) by mouth 3 (three) times daily as needed.  30 tablet  1   No current facility-administered medications on file prior to visit.   Family History  Problem Relation Age of Onset  . Cancer Mother   . Stroke Father   . Heart disease Father   . Diabetes Father    History   Social History  . Marital Status: Single    Spouse Name: N/A    Number of Children: N/A  . Years of Education: N/A    Occupational History  . Not on file.   Social History Main Topics  . Smoking status: Current Every Day Smoker -- 1.00 packs/day    Types: Cigarettes  . Smokeless tobacco: Not on file  . Alcohol Use: Yes     Comment: occ  . Drug Use: No  . Sexually Active: Not on file   Other Topics Concern  . Not on file   Social History Narrative  . No narrative on file    Review of Systems  Constitutional: Negative for fever, chills, diaphoresis, activity change, appetite change and fatigue.  HENT: Negative for ear pain, nosebleeds, congestion, facial swelling, rhinorrhea, neck pain, neck stiffness and ear discharge.   Eyes: Negative for pain, discharge, redness, itching and visual disturbance.  Respiratory: Negative for cough, choking, chest tightness, shortness of breath, wheezing and stridor.   Cardiovascular: Negative for chest pain, palpitations and leg swelling.  Gastrointestinal: Negative for abdominal distention.  Genitourinary: Negative for dysuria, urgency, frequency, hematuria, flank pain, decreased urine volume, difficulty urinating and dyspareunia.  Musculoskeletal: Negative for back pain, joint swelling, arthralgias and gait problem.  Neurological: Negative for dizziness, tremors, seizures, syncope, facial  asymmetry, speech difficulty, weakness, light-headedness, numbness and headaches.  Hematological: Negative for adenopathy. Does not bruise/bleed easily.  Psychiatric/Behavioral: Negative for hallucinations, behavioral problems, confusion, dysphoric mood, decreased concentration and agitation.    Objective:   Filed Vitals:   11/22/12 1422  BP: 127/79  Pulse: 73  Temp: 98.4 F (36.9 C)    Physical Exam  Constitutional: Appears well-developed and well-nourished. No distress.  HENT: Normocephalic. External right and left ear normal. Oropharynx is clear and moist.  Eyes: Conjunctivae and EOM are normal. PERRLA, no scleral icterus.  Neck: Normal ROM. Neck supple. No JVD.  No tracheal deviation. No thyromegaly.  CVS: RRR, S1/S2 +, no murmurs, no gallops, no carotid bruit.  Pulmonary: Effort and breath sounds normal, no stridor, rhonchi, wheezes, rales.  Abdominal: Soft. BS +,  no distension, tenderness, rebound or guarding.  Musculoskeletal: Normal range of motion. No edema and no tenderness.  Lymphadenopathy: No lymphadenopathy noted, cervical, inguinal. Neuro: Alert. Normal reflexes, muscle tone coordination. No cranial nerve deficit. Skin: Skin is warm and dry. No rash noted. Not diaphoretic. No erythema. No pallor.  Psychiatric: Normal mood and affect. Behavior, judgment, thought content normal.   Lab Results  Component Value Date   WBC 12.7* 01/07/2011   HGB 16.7 01/07/2011   HCT 49.0 01/07/2011   MCV 88.3 01/07/2011   PLT 217 01/07/2011   Lab Results  Component Value Date   CREATININE 1.10 01/07/2011   BUN 16 01/07/2011   NA 140 01/07/2011   K 4.1 01/07/2011   CL 107 01/07/2011   CO2 26 02/17/2010    Lab Results  Component Value Date   HGBA1C 7.3* 07/05/2012        Assessment:  Type II or unspecified type diabetes mellitus without mention of complication, uncontrolled - Plan: Comprehensive metabolic panel, Lipid panel, Hemoglobin A1C, Microalbumin/Creatinine Ratio, Urine  Unspecified essential hypertension - Plan: Comprehensive metabolic panel, Lipid panel, Hemoglobin A1C, Microalbumin/Creatinine Ratio, Urine  Chronic pain syndrome - Plan: Comprehensive metabolic panel, Lipid panel, Hemoglobin A1C, Microalbumin/Creatinine Ratio, Urine  Tobacco use disorder - Plan: Comprehensive metabolic panel, Lipid panel, Hemoglobin A1C, Microalbumin/Creatinine Ratio, Urine  Dental caries - Plan: Comprehensive metabolic panel, Lipid panel, Hemoglobin A1C, Microalbumin/Creatinine Ratio, Urine         Plan:       Refilled medications today  Tramadol 50 mg po every 8 hours prn  The patient was counseled on the dangers of tobacco use, and was advised to  quit.  Reviewed strategies to maximize success, including removing cigarettes and smoking materials from environment. Dental Referral  The patient was given clear instructions to go to ER or return to medical center if symptoms don't improve, worsen or new problems develop.  The patient verbalized understanding.  The patient was told to call to get lab results if they haven't heard anything in the next week.    Results for orders placed in visit on 11/22/12  COMPREHENSIVE METABOLIC PANEL      Result Value Range   Sodium 140  135 - 145 mEq/L   Potassium 4.9  3.5 - 5.3 mEq/L   Chloride 103  96 - 112 mEq/L   CO2 25  19 - 32 mEq/L   Glucose, Bld 205 (*) 70 - 99 mg/dL   BUN 22  6 - 23 mg/dL   Creat 8.29  5.62 - 1.30 mg/dL   Total Bilirubin 0.3  0.3 - 1.2 mg/dL   Alkaline Phosphatase 85  39 - 117 U/L   AST 18  0 - 37 U/L   ALT 38  0 - 53 U/L   Total Protein 6.9  6.0 - 8.3 g/dL   Albumin 4.5  3.5 - 5.2 g/dL   Calcium 9.8  8.4 - 96.0 mg/dL  LIPID PANEL      Result Value Range   Cholesterol 184  0 - 200 mg/dL   Triglycerides 454 (*) <150 mg/dL   HDL 35 (*) >09 mg/dL   Total CHOL/HDL Ratio 5.3     VLDL NOT CALC  0 - 40 mg/dL   LDL Cholesterol      HEMOGLOBIN A1C      Result Value Range   Hemoglobin A1C 7.9 (*) <5.7 %   Mean Plasma Glucose 180 (*) <117 mg/dL     Rodney Langton, MD, CDE, FAAFP [facility] [place], [place]

## 2012-11-22 NOTE — Progress Notes (Signed)
Quick Note:  Please inform patient that his labs reveal that her blood sugars are getting more out of control as evidenced by an A1c of 7.9%. She needs to work harder to get her diabetes in better control. Also, his cholesterol levels are elevated. Recommend he start pravastatin 80 mg po daily. Please call in prescription. Take 1 po daily, #30, RFX3, Recheck labs in 3 months.   Rodney Langton, MD, CDE, FAAFP [facility] [place], [place]   ______

## 2012-11-23 ENCOUNTER — Telehealth: Payer: Self-pay

## 2012-11-23 NOTE — Telephone Encounter (Signed)
Spoke with patient-lab results give Prescription called in to [facility] [phone] Pravastatin 80 mg po daily #30 with 3 refills

## 2012-11-26 ENCOUNTER — Telehealth: Payer: Self-pay | Admitting: *Deleted

## 2012-11-29 ENCOUNTER — Telehealth: Payer: Self-pay | Admitting: *Deleted

## 2012-11-30 ENCOUNTER — Ambulatory Visit: Payer: Medicare Other | Attending: Family Medicine | Admitting: Family Medicine

## 2012-11-30 VITALS — BP 135/84 | HR 91 | Temp 98.1°F | Resp 18 | Ht 73.0 in | Wt 237.0 lb

## 2012-11-30 DIAGNOSIS — L570 Actinic keratosis: Secondary | ICD-10-CM | POA: Diagnosis not present

## 2012-11-30 DIAGNOSIS — J209 Acute bronchitis, unspecified: Secondary | ICD-10-CM

## 2012-11-30 MED ORDER — IPRATROPIUM-ALBUTEROL 0.5-2.5 (3) MG/3ML IN SOLN
3.0000 mL | Freq: Once | RESPIRATORY_TRACT | Status: AC
  Administered 2012-11-30: 3 mL via RESPIRATORY_TRACT

## 2012-11-30 MED ORDER — AZITHROMYCIN 250 MG PO TABS: ORAL_TABLET | ORAL | Status: DC

## 2012-11-30 MED ORDER — ALBUTEROL SULFATE HFA 108 (90 BASE) MCG/ACT IN AERS: 2.0000 | INHALATION_SPRAY | Freq: Four times a day (QID) | RESPIRATORY_TRACT | Status: DC | PRN

## 2012-11-30 MED ORDER — FLUTICASONE PROPIONATE 50 MCG/ACT NA SUSP: 2.0000 | Freq: Every day | NASAL | Status: DC

## 2012-11-30 NOTE — Progress Notes (Signed)
Patient ID: Ryan Farmer, male   DOB: 10/12/1955, 57 y.o.   MRN: [id number]  CC: Cough and congestion  HPI: Pt has been having 2 weeks cough and chest congestion.  The patient reports that he is coughing up greenish sputum.  He's also having some wheezing.  The wheezing has been worse at night.  He denies fever and chills at this time.  No Known Allergies Past Medical History  Diagnosis Date  . Diabetes mellitus   . Arthritis    Current Outpatient Prescriptions on File Prior to Visit  Medication Sig Dispense Refill  . acetaminophen (TYLENOL) 500 MG tablet Take 1 tablet (500 mg total) by mouth every 6 (six) hours as needed for pain.  30 tablet  2  . albuterol (PROVENTIL HFA;VENTOLIN HFA) 108 (90 BASE) MCG/ACT inhaler Inhale 2 puffs into the lungs every 6 (six) hours as needed for wheezing.  1 Inhaler  2  . dextran 70-hypromellose (TEARS RENEWED) ophthalmic solution Place 1 drop into both eyes 3 (three) times daily as needed.  15 mL  3  . glipiZIDE (GLUCOTROL) 10 MG tablet Take 1 tablet (10 mg total) by mouth 2 (two) times daily before a meal.  60 tablet  3  . glucose blood (CHOICE DM FORA G20 TEST STRIPS) test strip Use as instructed  100 each  12  . Lancets (FREESTYLE) lancets Use as instructed  100 each  12  . metFORMIN (GLUCOPHAGE) 1000 MG tablet Take 1 tablet (1,000 mg total) by mouth 2 (two) times daily with a meal.  60 tablet  2  . traMADol (ULTRAM) 50 MG tablet Take 1 tablet (50 mg total) by mouth every 8 (eight) hours as needed.  90 tablet  3   No current facility-administered medications on file prior to visit.   Family History  Problem Relation Age of Onset  . Cancer Mother   . Stroke Father   . Heart disease Father   . Diabetes Father    History   Social History  . Marital Status: Single    Spouse Name: N/A    Number of Children: N/A  . Years of Education: N/A   Occupational History  . Not on file.   Social History Main Topics  . Smoking status: Current Every Day  Smoker -- 1.00 packs/day    Types: Cigarettes  . Smokeless tobacco: Not on file  . Alcohol Use: Yes     Comment: occ  . Drug Use: No  . Sexually Active: Not on file   Other Topics Concern  . Not on file   Social History Narrative  . No narrative on file    Review of Systems  Constitutional: Negative for fever, chills, diaphoresis, activity change, appetite change and fatigue.  HENT: Negative for ear pain, nosebleeds, congestion, facial swelling, rhinorrhea, neck pain, neck stiffness and ear discharge.   Eyes: Negative for pain, discharge, redness, itching and visual disturbance.  Respiratory: Negative for cough, choking, chest tightness, shortness of breath, wheezing and stridor.   Cardiovascular: Negative for chest pain, palpitations and leg swelling.  Gastrointestinal: Negative for abdominal distention.  Genitourinary: Negative for dysuria, urgency, frequency, hematuria, flank pain, decreased urine volume, difficulty urinating and dyspareunia.  Musculoskeletal: Negative for back pain, joint swelling, arthralgias and gait problem.  Neurological: Negative for dizziness, tremors, seizures, syncope, facial asymmetry, speech difficulty, weakness, light-headedness, numbness and headaches.  Hematological: Negative for adenopathy. Does not bruise/bleed easily.  Psychiatric/Behavioral: Negative for hallucinations, behavioral problems, confusion, dysphoric mood, decreased concentration  and agitation.    Objective:   Filed Vitals:   11/30/12 1522  BP: 135/84  Pulse: 91  Temp: 98.1 F (36.7 C)  Resp: 18    Physical Exam  Constitutional: Appears well-developed and well-nourished. No distress.  HENT: Normocephalic. External right and left ear normal.  Swollen nasal turbinates with yellow mucus seen  Eyes: Conjunctivae and EOM are normal. PERRLA, no scleral icterus.  Neck: Normal ROM. Neck supple. No JVD. No tracheal deviation. No thyromegaly.  CVS: RRR, S1/S2 +, no murmurs, no  gallops, no carotid bruit.  Pulmonary: Bilateral expiratory wheezes heard at both bases  Abdominal: Soft. BS +,  no distension, tenderness, rebound or guarding.  Musculoskeletal: Normal range of motion. No edema and no tenderness.  Lymphadenopathy: No lymphadenopathy noted, cervical, inguinal. Neuro: Alert. Normal reflexes, muscle tone coordination. No cranial nerve deficit. Skin: Skin is warm and dry. No rash noted. Not diaphoretic. No erythema. No pallor.  Psychiatric: Normal mood and affect. Behavior, judgment, thought content normal.   Lab Results  Component Value Date   WBC 12.7* 01/07/2011   HGB 16.7 01/07/2011   HCT 49.0 01/07/2011   MCV 88.3 01/07/2011   PLT 217 01/07/2011   Lab Results  Component Value Date   CREATININE 1.14 11/22/2012   BUN 22 11/22/2012   NA 140 11/22/2012   K 4.9 11/22/2012   CL 103 11/22/2012   CO2 25 11/22/2012    Lab Results  Component Value Date   HGBA1C 7.9* 11/22/2012   Lipid Panel     Component Value Date/Time   CHOL 184 11/22/2012 1535   TRIG 481* 11/22/2012 1535   HDL 35* 11/22/2012 1535   CHOLHDL 5.3 11/22/2012 1535   VLDL NOT CALC 11/22/2012 1535   LDLCALC Comment:   Not calculated due to Triglyceride >400. Suggest ordering Direct LDL (Unit Code: 41324).   Total Cholesterol/HDL Ratio:CHD Risk                        Coronary Heart Disease Risk Table                                        Men       Women          1/2 Average Risk              3.4        3.3              Average Risk              5.0        4.4           2X Average Risk              9.6        7.1           3X Average Risk             23.4       11.0 Use the calculated Patient Ratio above and the CHD Risk table  to determine the patient's CHD Risk. ATP III Classification (LDL):       < 100        mg/dL         Optimal      401 - 129     mg/dL  Near or Above Optimal      130 - 159     mg/dL         Borderline High      160 - 189     mg/dL         High       > 191        mg/dL          Very High   4/78/2956 1535       Assessment and plan:   Patient Active Problem List   Diagnosis Date Noted  . Type II or unspecified type diabetes mellitus without mention of complication, uncontrolled 11/22/2012  . Unspecified essential hypertension 11/22/2012  . Chronic pain syndrome 11/22/2012  . Tobacco use disorder 11/22/2012  . Dental caries 11/22/2012  Acute bronchitis/Sinusitis  z-pack take as directed flonase NS Albuterol HFA  The patient was given clear instructions to go to ER or return to medical center if symptoms don't improve, worsen or new problems develop.  The patient verbalized understanding.  The patient was told to call to get lab results if they haven't heard anything in the next week.    Rodney Langton, MD, CDE, FAAFP [facility] [place], [place]

## 2012-11-30 NOTE — Patient Instructions (Signed)
Sinusitis Sinusitis is redness, soreness, and puffiness (inflammation) of the air pockets in the bones of your face (sinuses). The redness, soreness, and puffiness can cause air and mucus to get trapped in your sinuses. This can allow germs to grow and cause an infection.  HOME CARE   Drink enough fluids to keep your pee (urine) clear or pale yellow.  Use a humidifier in your home.  Run a hot shower to create steam in the bathroom. Sit in the bathroom with the door closed. Breathe in the steam 3 4 times a day.  Put a warm, moist washcloth on your face 3 4 times a day, or as told by your doctor.  Use salt water sprays (saline sprays) to wet the thick fluid in your nose. This can help the sinuses drain.  Only take medicine as told by your doctor. GET HELP RIGHT AWAY IF:   Your pain gets worse.  You have very bad headaches.  You are sick to your stomach (nauseous).  You throw up (vomit).  You are very sleepy (drowsy) all the time.  Your face is puffy (swollen).  Your vision changes.  You have a stiff neck.  You have trouble breathing. MAKE SURE YOU:   Understand these instructions.  Will watch your condition.  Will get help right away if you are not doing well or get worse. Document Released: 12/14/2007 Document Revised: 03/21/2012 Document Reviewed: 01/31/2012 [facility] Patient Information 2014 [place], [place]. Acute Bronchitis Bronchitis is when the organs and tissues involved in breathing get puffy (swollen) and can leak fluid. This makes it harder for air to get in and out of the lungs. You may cough a lot and produce thick spit (mucus). Acute means the illness started suddenly. HOME CARE  Rest.  Drink enough fluids to keep the pee (urine) clear or pale yellow.  Medicines may be given that will open up your airways to help you breathe better. Only take medicine as told by your doctor.  Use a cool mist vaporizer. This will help to thin any thick spit.  Do not  smoke. Avoid secondhand smoke. GET HELP RIGHT AWAY IF:   You have a temperature by mouth above 102 F (38.9 C), not controlled by medicine.  You have chills.  You develop severe shortness of breath or chest pain.  You have bloody spit mixed with mucus (sputum).  You throw up (vomit) often.  You lose too much body fluid (dehydrated).  You have a severe headache.  You feel faint.  You do not improve after 1 week of treatment. MAKE SURE YOU:   Understand these instructions.  Will watch your condition.  Will get help right away if you are not doing well or get worse. Document Released: 12/14/2007 Document Revised: 09/19/2011 Document Reviewed: 07/15/2009 [facility] Patient Information 2014 [place], [place].

## 2012-12-21 ENCOUNTER — Ambulatory Visit: Payer: Medicare Other | Admitting: *Deleted

## 2012-12-23 ENCOUNTER — Emergency Department (HOSPITAL_COMMUNITY)
Admission: EM | Admit: 2012-12-23 | Discharge: 2012-12-23 | Disposition: A | Discharge status: Home / Self Care | Payer: Medicare Other | Attending: Emergency Medicine | Admitting: Emergency Medicine

## 2012-12-23 ENCOUNTER — Encounter (HOSPITAL_COMMUNITY): Payer: Self-pay | Admitting: Emergency Medicine

## 2012-12-23 ENCOUNTER — Emergency Department (HOSPITAL_COMMUNITY): Payer: Medicare Other

## 2012-12-23 DIAGNOSIS — J3489 Other specified disorders of nose and nasal sinuses: Secondary | ICD-10-CM | POA: Insufficient documentation

## 2012-12-23 DIAGNOSIS — Z79899 Other long term (current) drug therapy: Secondary | ICD-10-CM | POA: Insufficient documentation

## 2012-12-23 DIAGNOSIS — J40 Bronchitis, not specified as acute or chronic: Secondary | ICD-10-CM | POA: Diagnosis not present

## 2012-12-23 DIAGNOSIS — E119 Type 2 diabetes mellitus without complications: Secondary | ICD-10-CM | POA: Insufficient documentation

## 2012-12-23 DIAGNOSIS — J209 Acute bronchitis, unspecified: Secondary | ICD-10-CM | POA: Diagnosis not present

## 2012-12-23 DIAGNOSIS — R21 Rash and other nonspecific skin eruption: Secondary | ICD-10-CM | POA: Diagnosis not present

## 2012-12-23 DIAGNOSIS — IMO0002 Reserved for concepts with insufficient information to code with codable children: Secondary | ICD-10-CM | POA: Insufficient documentation

## 2012-12-23 DIAGNOSIS — Z8739 Personal history of other diseases of the musculoskeletal system and connective tissue: Secondary | ICD-10-CM | POA: Insufficient documentation

## 2012-12-23 DIAGNOSIS — F17200 Nicotine dependence, unspecified, uncomplicated: Secondary | ICD-10-CM | POA: Diagnosis not present

## 2012-12-23 LAB — GLUCOSE, CAPILLARY: Glucose-Capillary: 201 mg/dL — ABNORMAL HIGH (ref 70–99)

## 2012-12-23 MED ORDER — HYDROCORTISONE 1 % EX CREA: TOPICAL_CREAM | CUTANEOUS | Status: DC

## 2012-12-23 MED ORDER — CETIRIZINE HCL 10 MG PO TABS: 10.0000 mg | ORAL_TABLET | Freq: Every day | ORAL | Status: DC

## 2012-12-23 MED ORDER — PREDNISONE 20 MG PO TABS: 40.0000 mg | ORAL_TABLET | Freq: Every day | ORAL | Status: DC

## 2012-12-23 NOTE — ED Provider Notes (Signed)
History     CSN: [id number]  Arrival date & time 12/23/12  0746   First MD Initiated Contact with Patient 12/23/12 [phone]      No chief complaint on file.   (Consider location/radiation/quality/duration/timing/severity/associated sxs/prior treatment) HPI Ryan Farmer is a 57 y.o. male who presents with several complaints. States has had cough, congestion for about a month. Was seen by his PCP few weeks ago, finished course if zpack, states symptoms improved but have not resolved. States continues to cough up green mucus. States also having nasal congestion, watery itchy eyes, and sneezing. Not taking any allergy medications. States also developed a rash all over his body. States rash started after he mowed a yard 3 wks ago. States rash is itchy, mainly at night time. Has been using calimine lotion with symptom relief but rash is not improving. Denies fever, chills, malaise. No chest pain or shortness of breath.   Past Medical History  Diagnosis Date  . Diabetes mellitus   . Arthritis     Past Surgical History  Procedure Laterality Date  . Knee surgrery      Family History  Problem Relation Age of Onset  . Cancer Mother   . Stroke Father   . Heart disease Father   . Diabetes Father     History  Substance Use Topics  . Smoking status: Current Every Day Smoker -- 1.00 packs/day    Types: Cigarettes  . Smokeless tobacco: Not on file  . Alcohol Use: Yes     Comment: occ      Review of Systems  Constitutional: Negative for fever and chills.  HENT: Negative for neck pain and neck stiffness.   Respiratory: Positive for cough. Negative for chest tightness and shortness of breath.   Cardiovascular: Negative.   Gastrointestinal: Negative for nausea, vomiting and abdominal pain.  Genitourinary: Negative for dysuria and flank pain.  Musculoskeletal: Negative.   Skin: Positive for rash.    Allergies  Review of patient's allergies indicates no known allergies.  Home  Medications   Current Outpatient Rx  Name  Route  Sig  Dispense  Refill  . albuterol (PROVENTIL HFA;VENTOLIN HFA) 108 (90 BASE) MCG/ACT inhaler   Inhalation   Inhale 2 puffs into the lungs every 6 (six) hours as needed for wheezing.   1 Inhaler   2   . calamine lotion   Topical   Apply 1 application topically as needed (itching).         . diphenhydrAMINE (BENADRYL) 25 mg capsule   Oral   Take 50 mg by mouth every 6 (six) hours as needed for itching.         . fluticasone (FLONASE) 50 MCG/ACT nasal spray   Nasal   Place 2 sprays into the nose daily.   16 g   6   . glipiZIDE (GLUCOTROL) 10 MG tablet   Oral   Take 1 tablet (10 mg total) by mouth 2 (two) times daily before a meal.   60 tablet   3   . glucose blood (CHOICE DM FORA G20 TEST STRIPS) test strip      Use as instructed   100 each   12   . Lancets (FREESTYLE) lancets      Use as instructed   100 each   12   . metFORMIN (GLUCOPHAGE) 1000 MG tablet   Oral   Take 1 tablet (1,000 mg total) by mouth 2 (two) times daily with a meal.   60  tablet   2   . pravastatin (PRAVACHOL) 80 MG tablet   Oral   Take 80 mg by mouth daily.         . traMADol (ULTRAM) 50 MG tablet   Oral   Take 1 tablet (50 mg total) by mouth every 8 (eight) hours as needed.   90 tablet   3     BP 129/72  Pulse 62  Temp(Src) 97.6 F (36.4 C) (Oral)  Resp 16  SpO2 95%  Physical Exam  Nursing note and vitals reviewed. Constitutional: He appears well-developed and well-nourished. No distress.  HENT:  Head: Normocephalic.  Nose: Nose normal.  Mouth/Throat: Oropharynx is clear and moist.  Eyes: Conjunctivae are normal.  Neck: Neck supple.  Cardiovascular: Normal rate, regular rhythm and normal heart sounds.   Pulmonary/Chest: Effort normal and breath sounds normal. No respiratory distress. He has no wheezes. He has no rales.  Skin:  Mild erythematous fine rash over lower back, lower abdomen, bilateral forearms and  anterior thighs    ED Course  Procedures (including critical care time)  Labs Reviewed - No data to display No results found.  Results for orders placed during the hospital encounter of 12/23/12  GLUCOSE, CAPILLARY      Result Value Range   Glucose-Capillary 201 (*) 70 - 99 mg/dL   Comment 1 Notify RN     Dg Chest 2 View  12/23/2012   *RADIOLOGY REPORT*  Clinical Data: Cough  CHEST - 2 VIEW  Comparison: 03/21/2011  Findings: Cardiomediastinal silhouette is stable.  No acute infiltrate or pleural effusion.  No pulmonary edema.  Degenerative changes thoracic spine.  Central mild bronchitic changes.  IMPRESSION: No acute infiltrate or pulmonary edema.  Central mild bronchitic changes.   Original Report Authenticated By: Natasha Mead, M.D.     1. Bronchitis   2. Rash       MDM  Pt with cough and seasonal allergy type symptoms for over a month. CXR obtained to r/o vascular congestion vs consolidation. CXR showed mild bronchitic changes, otherwise normal. Suspect bronchitis. Pt is a heavy smoker. Will start on albuterol 2 puffs eery 4 hrs. Prednisone for inflammation. Rash most likely contact dermatitis. Does not look like scabies, no meningismus, no oral mucosal involvement. Will treat with zyrtec and hydrocortisone cream. Follow up with PCP.   Filed Vitals:   12/23/12 0818  BP: 129/72  Pulse: 62  Temp: 97.6 F (36.4 C)  TempSrc: Oral  Resp: 16  SpO2: 95%          Jannelle Notaro A Cayce Paschal, PA-C 12/23/12 1006

## 2012-12-23 NOTE — ED Notes (Signed)
Patient states "I took antibiotics and had a bad cough last month, the cough is better but I am still coughing up green stuff, I am also itching all over from this rash that ive had for 3 wks

## 2012-12-23 NOTE — ED Provider Notes (Signed)
Medical screening examination/treatment/procedure(s) were performed by non-physician practitioner and as supervising physician I was immediately available for consultation/collaboration.   Gwyneth Sprout, MD 12/23/12 1010

## 2012-12-23 NOTE — ED Notes (Signed)
Pt's CBG was 201 when I checked it.9:47 am JG.

## 2012-12-24 ENCOUNTER — Encounter: Payer: Self-pay | Admitting: Dietician

## 2012-12-24 ENCOUNTER — Encounter: Payer: Medicare Other | Attending: Family Medicine | Admitting: Dietician

## 2012-12-24 VITALS — Ht 73.0 in | Wt 237.0 lb

## 2012-12-24 DIAGNOSIS — E7800 Pure hypercholesterolemia, unspecified: Secondary | ICD-10-CM | POA: Diagnosis not present

## 2012-12-24 DIAGNOSIS — E781 Pure hyperglyceridemia: Secondary | ICD-10-CM

## 2012-12-24 DIAGNOSIS — Z713 Dietary counseling and surveillance: Secondary | ICD-10-CM | POA: Insufficient documentation

## 2012-12-24 DIAGNOSIS — E119 Type 2 diabetes mellitus without complications: Secondary | ICD-10-CM | POA: Diagnosis not present

## 2012-12-24 DIAGNOSIS — IMO0001 Reserved for inherently not codable concepts without codable children: Secondary | ICD-10-CM

## 2012-12-24 NOTE — Progress Notes (Signed)
Medical Nutrition Therapy:  Appt start time: 1500 end time:  1600.  Assessment:  Primary concerns today: high cholesterol/ type 2 diabetes.    Labs:  11/22/2012: Hba1c: 7.9, TG: >400      MEDICATIONS: see list     DIETARY INTAKE:  Usual eating pattern includes 3 meals and 1 snacks per day.  24-hr recall:  B ( AM): can of chicken noodle soup, water   Snk ( AM): tomato and bologna sandwich   L ( PM): french fries (3 small potatoes or 2 big ones), salt, water, pinto beans (sometimes roast beef, bologna or had sandwich, [place] buffet once a month) Snk ( PM): none usually D ( PM): usually cooks, boneless thighs baked and barbeque, no skin (eats out maybe once a month - [employer], [employer]), may cook a hamburger, barbeque sandwich  Snk ( PM): 2% milk, likes fruit - strawberries and cantaloupe   Beverages: water and milk   Usual physical activity: physically active for 4-5 hours a day - work on the car, walking to run errands   Pt was very amenable, but knowledge level was clearly low. Teaching methods needed to be adjusted for lower education level.  Progress Towards Goal(s):  In progress.   Nutritional Diagnosis:  Inconsistent carbohydrate intake related to type 2 diabetes as evidenced by Hba1c > 6.4 and dietary recall.     Intervention:  Nutrition education on carbohydrate counting, limiting saturated fat intake, low sodium foods, physiology of diabetes.  RD assigned carbohydrate control diet: 3 carb choices at breakfast and lunch, 3-4 carb choices at dinner and one 2 carb choice snack. RD encouraged client to meet goal of incorporating fish into diet 2 times a week. RD reviewed food groups and encouraged client to focus on more vegetables and lean proteins.   Handouts given during visit include:  Diabetes Basics Handbook  Carbohydrate Control Diet card  Meal Planning book   High triglyceride nutrition therapy handout   Monitoring/Evaluation:  Dietary intake, exercise,  blood glucose and body weight in 6 week(s).

## 2013-01-01 ENCOUNTER — Ambulatory Visit: Payer: Medicare Other | Attending: Family Medicine | Admitting: Internal Medicine

## 2013-01-01 VITALS — BP 125/86 | HR 77 | Temp 97.8°F | Ht 73.0 in | Wt 232.2 lb

## 2013-01-01 DIAGNOSIS — L237 Allergic contact dermatitis due to plants, except food: Secondary | ICD-10-CM

## 2013-01-01 DIAGNOSIS — L255 Unspecified contact dermatitis due to plants, except food: Secondary | ICD-10-CM

## 2013-01-01 MED ORDER — KETOCONAZOLE 2 % EX SHAM: MEDICATED_SHAMPOO | CUTANEOUS | Status: DC

## 2013-01-01 MED ORDER — HYDROCORTISONE 1 % EX CREA: TOPICAL_CREAM | CUTANEOUS | Status: DC

## 2013-01-01 MED ORDER — HYDROCODONE-ACETAMINOPHEN 5-325 MG PO TABS: 1.0000 | ORAL_TABLET | Freq: Four times a day (QID) | ORAL | Status: DC | PRN

## 2013-01-01 NOTE — Patient Instructions (Signed)
Poison Ivy Poison ivy is a inflammation of the skin (contact dermatitis) caused by touching the allergens on the leaves of the ivy plant following previous exposure to the plant. The rash usually appears 48 hours after exposure. The rash is usually bumps (papules) or blisters (vesicles) in a linear pattern. Depending on your own sensitivity, the rash may simply cause redness and itching, or it may also progress to blisters which may break open. These must be well cared for to prevent secondary bacterial (germ) infection, followed by scarring. Keep any open areas dry, clean, dressed, and covered with an antibacterial ointment if needed. The eyes may also get puffy. The puffiness is worst in the morning and gets better as the day progresses. This dermatitis usually heals without scarring, within 2 to 3 weeks without treatment. HOME CARE INSTRUCTIONS  Thoroughly wash with soap and water as soon as you have been exposed to poison ivy. You have about one half hour to remove the plant resin before it will cause the rash. This washing will destroy the oil or antigen on the skin that is causing, or will cause, the rash. Be sure to wash under your fingernails as any plant resin there will continue to spread the rash. Do not rub skin vigorously when washing affected area. Poison ivy cannot spread if no oil from the plant remains on your body. A rash that has progressed to weeping sores will not spread the rash unless you have not washed thoroughly. It is also important to wash any clothes you have been wearing as these may carry active allergens. The rash will return if you wear the unwashed clothing, even several days later. Avoidance of the plant in the future is the best measure. Poison ivy plant can be recognized by the number of leaves. Generally, poison ivy has three leaves with flowering branches on a single stem. Diphenhydramine may be purchased over the counter and used as needed for itching. Do not drive with  this medication if it makes you drowsy.Ask your caregiver about medication for children. SEEK MEDICAL CARE IF:  Open sores develop.  Redness spreads beyond area of rash.  You notice purulent (pus-like) discharge.  You have increased pain.  Other signs of infection develop (such as fever). Document Released: 06/24/2000 Document Revised: 09/19/2011 Document Reviewed: 05/13/2009 [employer] Patient Information 2014 [employer].  

## 2013-01-01 NOTE — Progress Notes (Signed)
Patient ID: Ryan Farmer, male   DOB: [birth year], 56 y.o.   MRN: [id number]  CC: Itching  HPI: Patient is 57 year old male who presents to clinic with main concern of 2-3 day duration of generalized itching mostly in upper extremities and lower extremities. He describes he was exposed to poison ivy and feels like this is spreading. He denies similar episodes in the past. He denies chest pain or shortness of breath no other systemic concerns, no changes in appetite, no neurological symptoms. He denies using anything over-the-counter  No Known Allergies Past Medical History  Diagnosis Date  . Diabetes mellitus   . Arthritis    Current Outpatient Prescriptions on File Prior to Visit  Medication Sig Dispense Refill  . albuterol (PROVENTIL HFA;VENTOLIN HFA) 108 (90 BASE) MCG/ACT inhaler Inhale 2 puffs into the lungs every 6 (six) hours as needed for wheezing.  1 Inhaler  2  . calamine lotion Apply 1 application topically as needed (itching).      . cetirizine (ZYRTEC ALLERGY) 10 MG tablet Take 1 tablet (10 mg total) by mouth daily.  30 tablet  0  . diphenhydrAMINE (BENADRYL) 25 mg capsule Take 50 mg by mouth every 6 (six) hours as needed for itching.      . fluticasone (FLONASE) 50 MCG/ACT nasal spray Place 2 sprays into the nose daily.  16 g  6  . glipiZIDE (GLUCOTROL) 10 MG tablet Take 1 tablet (10 mg total) by mouth 2 (two) times daily before a meal.  60 tablet  3  . glucose blood (CHOICE DM FORA G20 TEST STRIPS) test strip Use as instructed  100 each  12  . Lancets (FREESTYLE) lancets Use as instructed  100 each  12  . metFORMIN (GLUCOPHAGE) 1000 MG tablet Take 1 tablet (1,000 mg total) by mouth 2 (two) times daily with a meal.  60 tablet  2  . pravastatin (PRAVACHOL) 80 MG tablet Take 80 mg by mouth daily.      . predniSONE (DELTASONE) 20 MG tablet Take 2 tablets (40 mg total) by mouth daily.  10 tablet  0  . traMADol (ULTRAM) 50 MG tablet Take 1 tablet (50 mg total) by mouth every 8 (eight)  hours as needed.  90 tablet  3   No current facility-administered medications on file prior to visit.   Family History  Problem Relation Age of Onset  . Cancer Mother   . Stroke Father   . Heart disease Father   . Diabetes Father    History   Social History  . Marital Status: Single    Spouse Name: N/A    Number of Children: N/A  . Years of Education: N/A   Occupational History  . Not on file.   Social History Main Topics  . Smoking status: Current Every Day Smoker -- 1.00 packs/day    Types: Cigarettes  . Smokeless tobacco: Not on file  . Alcohol Use: Yes     Comment: occ  . Drug Use: No  . Sexually Active: Not on file   Other Topics Concern  . Not on file   Social History Narrative  . No narrative on file    Review of Systems  Constitutional: Negative for fever, chills, diaphoresis, activity change, appetite change and fatigue.  HENT: Negative for ear pain, nosebleeds, congestion, facial swelling, rhinorrhea, neck pain, neck stiffness and ear discharge.   Eyes: Negative for pain, discharge, redness, itching and visual disturbance.  Respiratory: Negative for cough, choking, chest tightness,  shortness of breath, wheezing and stridor.   Cardiovascular: Negative for chest pain, palpitations and leg swelling.  Gastrointestinal: Negative for abdominal distention.  Genitourinary: Negative for dysuria, urgency, frequency, hematuria, flank pain, decreased urine volume, difficulty urinating and dyspareunia.  Musculoskeletal: Negative for back pain, joint swelling, arthralgias and gait problem.  Neurological: Negative for dizziness, tremors, seizures, syncope, facial asymmetry, speech difficulty, weakness, light-headedness, numbness and headaches.  Hematological: Negative for adenopathy. Does not bruise/bleed easily.  Psychiatric/Behavioral: Negative for hallucinations, behavioral problems, confusion, dysphoric mood, decreased concentration and agitation.    Objective:    Filed Vitals:   01/01/13 1735  BP: 125/86  Pulse: 77  Temp: 97.8 F (36.6 C)    Physical Exam  Constitutional: Appears well-developed and well-nourished. No distress.  HENT: Normocephalic. External right and left ear normal. Oropharynx is clear and moist.  Eyes: Conjunctivae and EOM are normal. PERRLA, no scleral icterus.  Neck: Normal ROM. Neck supple. No JVD. No tracheal deviation. No thyromegaly.  CVS: RRR, S1/S2 +, no murmurs, no gallops, no carotid bruit.  Pulmonary: Effort and breath sounds normal, no stridor, rhonchi, wheezes, rales.  Abdominal: Soft. BS +,  no distension, tenderness, rebound or guarding.  Skin: Skin is warm and dry. Poison ivy type of rash, macular rash noted on lower extremities and extending from ankles to knees  Psychiatric: Normal mood and affect. Behavior, judgment, thought content normal.   Lab Results  Component Value Date   WBC 12.7* 01/07/2011   HGB 16.7 01/07/2011   HCT 49.0 01/07/2011   MCV 88.3 01/07/2011   PLT 217 01/07/2011   Lab Results  Component Value Date   CREATININE 1.14 11/22/2012   BUN 22 11/22/2012   NA 140 11/22/2012   K 4.9 11/22/2012   CL 103 11/22/2012   CO2 25 11/22/2012    Lab Results  Component Value Date   HGBA1C 7.9* 11/22/2012   Lipid Panel     Component Value Date/Time   CHOL 184 11/22/2012 1535   TRIG 481* 11/22/2012 1535   HDL 35* 11/22/2012 1535   CHOLHDL 5.3 11/22/2012 1535   VLDL NOT CALC 11/22/2012 1535   LDLCALC Comment:   Not calculated due to Triglyceride >400. Suggest ordering Direct LDL (Unit Code: 16109).   Total Cholesterol/HDL Ratio:CHD Risk                        Coronary Heart Disease Risk Table                                        Men       Women          1/2 Average Risk              3.4        3.3              Average Risk              5.0        4.4           2X Average Risk              9.6        7.1           3X Average Risk             23.4  11.0 Use the calculated Patient Ratio above and the  CHD Risk table  to determine the patient's CHD Risk. ATP III Classification (LDL):       < 100        mg/dL         Optimal      161 - 129     mg/dL         Near or Above Optimal      130 - 159     mg/dL         Borderline High      160 - 189     mg/dL         High       > 096        mg/dL         Very High   0/45/4098 1535       Assessment and plan:   Patient Active Problem List   Diagnosis Date Noted  . Unspecified essential hypertension - reasonable control, we have discussed importance of checking blood pressure regularly and to call his back in the numbers are higher than 140/90  11/22/2012  . Tobacco use disorder - cessation discussed in detail  11/22/2012  .  poison ivy  - we have discussed avoiding itching, will prescribe anti-itch lotion, avoid touching and wash hands regularly  11/22/2012

## 2013-02-04 ENCOUNTER — Ambulatory Visit: Payer: Medicare Other | Admitting: Dietician

## 2013-02-06 ENCOUNTER — Other Ambulatory Visit: Payer: Self-pay | Admitting: Family Medicine

## 2013-02-06 MED ORDER — METFORMIN HCL 1000 MG PO TABS: 1000.0000 mg | ORAL_TABLET | Freq: Two times a day (BID) | ORAL | Status: DC

## 2013-02-13 ENCOUNTER — Encounter: Payer: Self-pay | Admitting: Internal Medicine

## 2013-02-13 ENCOUNTER — Ambulatory Visit: Payer: Medicare Other | Attending: Family Medicine | Admitting: Internal Medicine

## 2013-02-13 VITALS — BP 122/75 | HR 78 | Temp 98.0°F | Resp 16 | Ht 73.0 in | Wt 232.8 lb

## 2013-02-13 DIAGNOSIS — IMO0001 Reserved for inherently not codable concepts without codable children: Secondary | ICD-10-CM | POA: Insufficient documentation

## 2013-02-13 DIAGNOSIS — Z760 Encounter for issue of repeat prescription: Secondary | ICD-10-CM | POA: Diagnosis not present

## 2013-02-13 DIAGNOSIS — K029 Dental caries, unspecified: Secondary | ICD-10-CM | POA: Insufficient documentation

## 2013-02-13 MED ORDER — METFORMIN HCL 1000 MG PO TABS: 1000.0000 mg | ORAL_TABLET | Freq: Two times a day (BID) | ORAL | Status: DC

## 2013-02-13 MED ORDER — IBUPROFEN 800 MG PO TABS: 800.0000 mg | ORAL_TABLET | Freq: Four times a day (QID) | ORAL | Status: DC | PRN

## 2013-02-13 NOTE — Progress Notes (Signed)
Patient ID: Ryan Farmer, male   DOB: 14-Jan-1956, 57 y.o.   MRN: [id number]  CC: Medication refill  HPI: Patient was seen in the clinic today for medication refill (metformin). He complained of tooth ache after a filling on the dental office a week ago. No fever, no chest pain. Patient continue to smoke cigarette, one pack per day. He claims he has cut down from 3 packs to 1, but not ready to quit.  No Known Allergies Past Medical History  Diagnosis Date  . Diabetes mellitus   . Arthritis    Current Outpatient Prescriptions on File Prior to Visit  Medication Sig Dispense Refill  . fluticasone (FLONASE) 50 MCG/ACT nasal spray Place 2 sprays into the nose daily.  16 g  6  . glipiZIDE (GLUCOTROL) 10 MG tablet Take 1 tablet (10 mg total) by mouth 2 (two) times daily before a meal.  60 tablet  3  . glucose blood (CHOICE DM FORA G20 TEST STRIPS) test strip Use as instructed  100 each  12  . Lancets (FREESTYLE) lancets Use as instructed  100 each  12  . pravastatin (PRAVACHOL) 80 MG tablet Take 80 mg by mouth daily.      . traMADol (ULTRAM) 50 MG tablet Take 1 tablet (50 mg total) by mouth every 8 (eight) hours as needed.  90 tablet  3  . albuterol (PROVENTIL HFA;VENTOLIN HFA) 108 (90 BASE) MCG/ACT inhaler Inhale 2 puffs into the lungs every 6 (six) hours as needed for wheezing.  1 Inhaler  2  . calamine lotion Apply 1 application topically as needed (itching).      . cetirizine (ZYRTEC ALLERGY) 10 MG tablet Take 1 tablet (10 mg total) by mouth daily.  30 tablet  0  . diphenhydrAMINE (BENADRYL) 25 mg capsule Take 50 mg by mouth every 6 (six) hours as needed for itching.      Marland Kitchen HYDROcodone-acetaminophen (NORCO/VICODIN) 5-325 MG per tablet Take 1 tablet by mouth every 6 (six) hours as needed for pain.  65 tablet  0  . hydrocortisone cream 1 % Apply to affected area 2 times daily  15 g  3  . ketoconazole (NIZORAL) 2 % shampoo Apply topically 2 (two) times a week.  120 mL  3   No current  facility-administered medications on file prior to visit.   Family History  Problem Relation Age of Onset  . Cancer Mother   . Stroke Father   . Heart disease Father   . Diabetes Father    History   Social History  . Marital Status: Single    Spouse Name: N/A    Number of Children: N/A  . Years of Education: N/A   Occupational History  . Not on file.   Social History Main Topics  . Smoking status: Current Every Day Smoker -- 1.00 packs/day    Types: Cigarettes  . Smokeless tobacco: Not on file  . Alcohol Use: Yes     Comment: occ  . Drug Use: No  . Sexually Active: Not on file   Other Topics Concern  . Not on file   Social History Narrative  . No narrative on file    Review of Systems: Constitutional: Negative for fever, chills, diaphoresis, activity change, appetite change and fatigue. HENT: Negative for ear pain, nosebleeds, congestion, facial swelling, rhinorrhea, neck pain, neck stiffness and ear discharge.  Eyes: Negative for pain, discharge, redness, itching and visual disturbance. Respiratory: Negative for cough, choking, chest tightness, shortness of  breath, wheezing and stridor.  Cardiovascular: Negative for chest pain, palpitations and leg swelling. Gastrointestinal: Negative for abdominal distention. Genitourinary: Negative for dysuria, urgency, frequency, hematuria, flank pain, decreased urine volume, difficulty urinating and dyspareunia.  Musculoskeletal: Negative for back pain, joint swelling, arthralgias and gait problem. Neurological: Negative for dizziness, tremors, seizures, syncope, facial asymmetry, speech difficulty, weakness, light-headedness, numbness and headaches.  Hematological: Negative for adenopathy. Does not bruise/bleed easily. Psychiatric/Behavioral: Negative for hallucinations, behavioral problems, confusion, dysphoric mood, decreased concentration and agitation.    Objective:   Filed Vitals:   02/13/13 1642  BP: 122/75  Pulse:  78  Temp: 98 F (36.7 C)  Resp: 16    Physical Exam: Constitutional: Patient appears well-developed and well-nourished. No distress. HENT: Normocephalic, atraumatic, External right and left ear normal. Oropharynx is clear and moist.  Eyes: Conjunctivae and EOM are normal. PERRLA, no scleral icterus. Neck: Normal ROM. Neck supple. No JVD. No tracheal deviation. No thyromegaly. CVS: RRR, S1/S2 +, no murmurs, no gallops, no carotid bruit.  Pulmonary: Effort and breath sounds normal, no stridor, rhonchi, wheezes, rales.  Abdominal: Soft. BS +,  no distension, tenderness, rebound or guarding.  Musculoskeletal: Normal range of motion. No edema and no tenderness.  Lymphadenopathy: No lymphadenopathy noted, cervical, inguinal or axillary Neuro: Alert. Normal reflexes, muscle tone coordination. No cranial nerve deficit. Skin: Skin is warm and dry. No rash noted. Not diaphoretic. No erythema. No pallor. Psychiatric: Normal mood and affect. Behavior, judgment, thought content normal.  Lab Results  Component Value Date   WBC 12.7* 01/07/2011   HGB 16.7 01/07/2011   HCT 49.0 01/07/2011   MCV 88.3 01/07/2011   PLT 217 01/07/2011   Lab Results  Component Value Date   CREATININE 1.14 11/22/2012   BUN 22 11/22/2012   NA 140 11/22/2012   K 4.9 11/22/2012   CL 103 11/22/2012   CO2 25 11/22/2012    Lab Results  Component Value Date   HGBA1C 7.9* 11/22/2012   Lipid Panel     Component Value Date/Time   CHOL 184 11/22/2012 1535   TRIG 481* 11/22/2012 1535   HDL 35* 11/22/2012 1535   CHOLHDL 5.3 11/22/2012 1535   VLDL NOT CALC 11/22/2012 1535   LDLCALC Comment:   Not calculated due to Triglyceride >400. Suggest ordering Direct LDL (Unit Code: 40981).   Total Cholesterol/HDL Ratio:CHD Risk                        Coronary Heart Disease Risk Table                                        Men       Women          1/2 Average Risk              3.4        3.3              Average Risk              5.0        4.4            2X Average Risk              9.6        7.1           3X Average Risk  23.4       11.0 Use the calculated Patient Ratio above and the CHD Risk table  to determine the patient's CHD Risk. ATP III Classification (LDL):       < 100        mg/dL         Optimal      161 - 129     mg/dL         Near or Above Optimal      130 - 159     mg/dL         Borderline High      160 - 189     mg/dL         High       > 096        mg/dL         Very High   0/45/4098 1535       Assessment and plan:   Patient Active Problem List   Diagnosis Date Noted  . Medication refill 02/13/2013  . Type II or unspecified type diabetes mellitus without mention of complication, uncontrolled 11/22/2012  . Unspecified essential hypertension 11/22/2012  . Chronic pain syndrome 11/22/2012  . Tobacco use disorder 11/22/2012  . Dental caries 11/22/2012   Refilled metformin 1000 mg by mouth twice a day Ibuprofen 800 mg by mouth q 6 h when necessary for pain Patient was counseled about pain  Extensive counseling for smoking cessation done  Ryan Farmer was given clear instructions to go to ER or return to the clinic if symptoms don't improve, worsen or new problems develop.  Ryan Farmer verbalized understanding.  Ryan Farmer was told to call to get lab results if hasn't heard anything in the next week.        Jeanann Lewandowsky, MD [facility] [place], [place] [phone]   02/13/2013, 5:17 PM

## 2013-02-13 NOTE — Progress Notes (Signed)
Pt here for medication refill Metformin supply and Hydrocodone for chronic pain syndrome. vss

## 2013-03-20 ENCOUNTER — Telehealth: Payer: Self-pay | Admitting: Internal Medicine

## 2013-03-20 NOTE — Telephone Encounter (Signed)
Pt called regarding medication refill for albuterol (PROVENTIL HFA;VENTOLIN HFA), pravastatin (PRAVACHOL) 80 MG, traMADol (ULTRAM) 50 MG, please call back

## 2013-03-22 ENCOUNTER — Other Ambulatory Visit: Payer: Self-pay | Admitting: Emergency Medicine

## 2013-03-22 MED ORDER — ALBUTEROL SULFATE HFA 108 (90 BASE) MCG/ACT IN AERS: 2.0000 | INHALATION_SPRAY | Freq: Four times a day (QID) | RESPIRATORY_TRACT | Status: DC | PRN

## 2013-03-22 NOTE — Telephone Encounter (Signed)
PROVENTIL HFA REORDERED. PRAVASTATIN NEED DISPENSE AND REFILL ORDER.

## 2013-04-01 ENCOUNTER — Ambulatory Visit: Payer: Medicare Other | Attending: Internal Medicine | Admitting: Internal Medicine

## 2013-04-01 ENCOUNTER — Encounter: Payer: Self-pay | Admitting: Internal Medicine

## 2013-04-01 VITALS — BP 133/87 | HR 78 | Temp 97.7°F | Resp 16 | Ht 73.0 in | Wt 231.0 lb

## 2013-04-01 DIAGNOSIS — Z09 Encounter for follow-up examination after completed treatment for conditions other than malignant neoplasm: Secondary | ICD-10-CM | POA: Insufficient documentation

## 2013-04-01 DIAGNOSIS — M129 Arthropathy, unspecified: Secondary | ICD-10-CM | POA: Insufficient documentation

## 2013-04-01 DIAGNOSIS — E119 Type 2 diabetes mellitus without complications: Secondary | ICD-10-CM | POA: Insufficient documentation

## 2013-04-01 DIAGNOSIS — IMO0001 Reserved for inherently not codable concepts without codable children: Secondary | ICD-10-CM

## 2013-04-01 DIAGNOSIS — Z760 Encounter for issue of repeat prescription: Secondary | ICD-10-CM | POA: Diagnosis not present

## 2013-04-01 DIAGNOSIS — K029 Dental caries, unspecified: Secondary | ICD-10-CM

## 2013-04-01 DIAGNOSIS — I10 Essential (primary) hypertension: Secondary | ICD-10-CM | POA: Diagnosis not present

## 2013-04-01 DIAGNOSIS — F17200 Nicotine dependence, unspecified, uncomplicated: Secondary | ICD-10-CM

## 2013-04-01 LAB — POCT GLYCOSYLATED HEMOGLOBIN (HGB A1C): Hemoglobin A1C: 7.5

## 2013-04-01 MED ORDER — METFORMIN HCL 1000 MG PO TABS: 1000.0000 mg | ORAL_TABLET | Freq: Two times a day (BID) | ORAL | Status: DC

## 2013-04-01 MED ORDER — FLUTICASONE PROPIONATE 50 MCG/ACT NA SUSP: 2.0000 | Freq: Every day | NASAL | Status: DC

## 2013-04-01 MED ORDER — PRAVASTATIN SODIUM 80 MG PO TABS: 80.0000 mg | ORAL_TABLET | Freq: Every day | ORAL | Status: DC

## 2013-04-01 MED ORDER — KETOCONAZOLE 2 % EX SHAM: MEDICATED_SHAMPOO | CUTANEOUS | Status: DC

## 2013-04-01 MED ORDER — IBUPROFEN 800 MG PO TABS: 800.0000 mg | ORAL_TABLET | Freq: Three times a day (TID) | ORAL | Status: DC | PRN

## 2013-04-01 MED ORDER — GLIPIZIDE 10 MG PO TABS: 10.0000 mg | ORAL_TABLET | Freq: Two times a day (BID) | ORAL | Status: DC

## 2013-04-01 MED ORDER — TRAMADOL HCL 50 MG PO TABS: 50.0000 mg | ORAL_TABLET | Freq: Three times a day (TID) | ORAL | Status: DC | PRN

## 2013-04-01 MED ORDER — HYDROCORTISONE 1 % EX CREA: TOPICAL_CREAM | CUTANEOUS | Status: DC

## 2013-04-01 MED ORDER — ALBUTEROL SULFATE HFA 108 (90 BASE) MCG/ACT IN AERS: 2.0000 | INHALATION_SPRAY | Freq: Four times a day (QID) | RESPIRATORY_TRACT | Status: DC | PRN

## 2013-04-01 NOTE — Progress Notes (Signed)
Patient ID: Ryan Farmer, male   DOB: [birth year], 56 y.o.   MRN: [id number] Patient Demographics  Ryan Farmer, is a 57 y.o. male  [id number]  [id number]  DOB - [birth year]  Chief Complaint  Patient presents with  . Follow-up  . Medication Refill        Subjective:   Ryan Farmer is a 57 y.o. male here today for a follow up visit. Needs refill on all his medications. No complaints today. Continues to smoke one pack per day of cigarette. Patient has No headache, No chest pain, No abdominal pain - No Nausea, No new weakness tingling or numbness, No Cough - SOB.  ALLERGIES:  No Known Allergies  PAST MEDICAL HISTORY: Past Medical History  Diagnosis Date  . Diabetes mellitus   . Arthritis     MEDICATIONS AT HOME: Prior to Admission medications   Medication Sig Start Date End Date Taking? Authorizing Provider  albuterol (PROVENTIL HFA;VENTOLIN HFA) 108 (90 BASE) MCG/ACT inhaler Inhale 2 puffs into the lungs every 6 (six) hours as needed for wheezing. 04/01/13  Yes Jeanann Lewandowsky, MD  glipiZIDE (GLUCOTROL) 10 MG tablet Take 1 tablet (10 mg total) by mouth 2 (two) times daily before a meal. 04/01/13  Yes Jeanann Lewandowsky, MD  hydrocortisone cream 1 % Apply to affected area 2 times daily 04/01/13  Yes Jeanann Lewandowsky, MD  metFORMIN (GLUCOPHAGE) 1000 MG tablet Take 1 tablet (1,000 mg total) by mouth 2 (two) times daily with a meal. 04/01/13  Yes Jeanann Lewandowsky, MD  pravastatin (PRAVACHOL) 80 MG tablet Take 1 tablet (80 mg total) by mouth daily. 04/01/13  Yes Jeanann Lewandowsky, MD  traMADol (ULTRAM) 50 MG tablet Take 1 tablet (50 mg total) by mouth every 8 (eight) hours as needed. 04/01/13  Yes Jeanann Lewandowsky, MD  calamine lotion Apply 1 application topically as needed (itching).    Historical Provider, MD  cetirizine (ZYRTEC ALLERGY) 10 MG tablet Take 1 tablet (10 mg total) by mouth daily. 12/23/12   Tatyana A Kirichenko, PA-C  diphenhydrAMINE (BENADRYL) 25 mg capsule Take 50  mg by mouth every 6 (six) hours as needed for itching.    Historical Provider, MD  fluticasone (FLONASE) 50 MCG/ACT nasal spray Place 2 sprays into the nose daily. 04/01/13   Jeanann Lewandowsky, MD  glucose blood (CHOICE DM FORA G20 TEST STRIPS) test strip Use as instructed 07/05/12   Meredeth Ide, MD  HYDROcodone-acetaminophen (NORCO/VICODIN) 5-325 MG per tablet Take 1 tablet by mouth every 6 (six) hours as needed for pain. 01/01/13   Dorothea Ogle, MD  ibuprofen (ADVIL,MOTRIN) 800 MG tablet Take 1 tablet (800 mg total) by mouth every 8 (eight) hours as needed for pain. 04/01/13   Jeanann Lewandowsky, MD  ketoconazole (NIZORAL) 2 % shampoo Apply topically 2 (two) times a week. 04/01/13   Jeanann Lewandowsky, MD  Lancets (FREESTYLE) lancets Use as instructed 07/05/12   Meredeth Ide, MD     Objective:   Filed Vitals:   04/01/13 1307  BP: 133/87  Pulse: 78  Temp: 97.7 F (36.5 C)  TempSrc: Oral  Resp: 16  Height: 6\' 1"  (1.854 m)  Weight: 231 lb (104.781 kg)  SpO2: 100%    Exam General appearance :Awake, alert, not in any distress. Speech Clear. Not toxic Looking HEENT: Atraumatic and Normocephalic, pupils equally reactive to light and accomodation Neck: supple, no JVD. No cervical lymphadenopathy.  Chest:Good air entry bilaterally, no added sounds  CVS: S1 S2 regular, no murmurs.  Abdomen:  Bowel sounds present, Non tender and not distended with no gaurding, rigidity or rebound. Extremities: B/L Lower Ext shows no edema, both legs are warm to touch Neurology: Awake alert, and oriented X 3, CN II-XII intact, Non focal Skin:No Rash Wounds:N/A   Data Review   CBC No results found for this basename: WBC, HGB, HCT, PLT, MCV, MCH, MCHC, RDW, NEUTRABS, LYMPHSABS, MONOABS, EOSABS, BASOSABS, BANDABS, BANDSABD,  in the last 168 hours  Chemistries   No results found for this basename: NA, K, CL, CO2, GLUCOSE, BUN, CREATININE, GFRCGP, CALCIUM, MG, AST, ALT, ALKPHOS, BILITOT,  in the last 168  hours ------------------------------------------------------------------------------------------------------------------  Recent Labs  04/01/13 1321  HGBA1C 7.5   ------------------------------------------------------------------------------------------------------------------ No results found for this basename: CHOL, HDL, LDLCALC, TRIG, CHOLHDL, LDLDIRECT,  in the last 72 hours ------------------------------------------------------------------------------------------------------------------ No results found for this basename: TSH, T4TOTAL, FREET3, T3FREE, THYROIDAB,  in the last 72 hours ------------------------------------------------------------------------------------------------------------------ No results found for this basename: VITAMINB12, FOLATE, FERRITIN, TIBC, IRON, RETICCTPCT,  in the last 72 hours  Coagulation profile  No results found for this basename: INR, PROTIME,  in the last 168 hours    Assessment & Plan   Patient Active Problem List   Diagnosis Date Noted  . Diabetes 04/01/2013  . Medication refill 02/13/2013  . Type II or unspecified type diabetes mellitus without mention of complication, uncontrolled 11/22/2012  . Unspecified essential hypertension 11/22/2012  . Chronic pain syndrome 11/22/2012  . Tobacco use disorder 11/22/2012  . Dental caries 11/22/2012     Plan: Medications refilled Patient encouraged to keep her subsequent appointment Patient counseled extensively about nutrition and exercise Patient counseled again about smoking cessation   Follow up in 3 months   The patient was given clear instructions to go to ER or return to medical center if symptoms don't improve, worsen or new problems develop. The patient verbalized understanding. The patient was told to call to get lab results if they haven't heard anything in the next week.    Jeanann Lewandowsky, MD, MHA, FACP [facility] and [facility] [place],  [place] [phone]   04/01/2013, 1:28 PMa

## 2013-04-01 NOTE — Progress Notes (Signed)
Pt. Here for medication refill  C/o tooth pain unrelieved by Ibuprofen 800 mg

## 2013-04-15 ENCOUNTER — Ambulatory Visit: Payer: Medicare Other | Admitting: Internal Medicine

## 2013-08-02 ENCOUNTER — Other Ambulatory Visit: Payer: Self-pay | Admitting: Internal Medicine

## 2013-08-19 ENCOUNTER — Ambulatory Visit: Payer: Medicare Other | Attending: Internal Medicine | Admitting: Internal Medicine

## 2013-08-19 VITALS — BP 118/83 | HR 100 | Temp 98.6°F | Resp 14 | Ht 73.0 in | Wt 235.6 lb

## 2013-08-19 DIAGNOSIS — J209 Acute bronchitis, unspecified: Secondary | ICD-10-CM | POA: Diagnosis not present

## 2013-08-19 DIAGNOSIS — E119 Type 2 diabetes mellitus without complications: Secondary | ICD-10-CM

## 2013-08-19 DIAGNOSIS — Z79899 Other long term (current) drug therapy: Secondary | ICD-10-CM | POA: Insufficient documentation

## 2013-08-19 DIAGNOSIS — E1165 Type 2 diabetes mellitus with hyperglycemia: Secondary | ICD-10-CM

## 2013-08-19 DIAGNOSIS — IMO0001 Reserved for inherently not codable concepts without codable children: Secondary | ICD-10-CM | POA: Diagnosis not present

## 2013-08-19 DIAGNOSIS — F17200 Nicotine dependence, unspecified, uncomplicated: Secondary | ICD-10-CM

## 2013-08-19 DIAGNOSIS — R059 Cough, unspecified: Secondary | ICD-10-CM | POA: Insufficient documentation

## 2013-08-19 DIAGNOSIS — K029 Dental caries, unspecified: Secondary | ICD-10-CM | POA: Diagnosis not present

## 2013-08-19 DIAGNOSIS — Z760 Encounter for issue of repeat prescription: Secondary | ICD-10-CM

## 2013-08-19 DIAGNOSIS — G894 Chronic pain syndrome: Secondary | ICD-10-CM | POA: Diagnosis not present

## 2013-08-19 DIAGNOSIS — I10 Essential (primary) hypertension: Secondary | ICD-10-CM

## 2013-08-19 DIAGNOSIS — R05 Cough: Secondary | ICD-10-CM | POA: Insufficient documentation

## 2013-08-19 LAB — CBC WITH DIFFERENTIAL/PLATELET
BASOS ABS: 0 10*3/uL (ref 0.0–0.1)
BASOS PCT: 0 % (ref 0–1)
Eosinophils Absolute: 0.2 10*3/uL (ref 0.0–0.7)
Eosinophils Relative: 2 % (ref 0–5)
HEMATOCRIT: 49.5 % (ref 39.0–52.0)
Hemoglobin: 16.5 g/dL (ref 13.0–17.0)
LYMPHS PCT: 26 % (ref 12–46)
Lymphs Abs: 2.2 10*3/uL (ref 0.7–4.0)
MCH: 28.8 pg (ref 26.0–34.0)
MCHC: 33.3 g/dL (ref 30.0–36.0)
MCV: 86.5 fL (ref 78.0–100.0)
MONO ABS: 0.7 10*3/uL (ref 0.1–1.0)
Monocytes Relative: 8 % (ref 3–12)
NEUTROS ABS: 5.4 10*3/uL (ref 1.7–7.7)
NEUTROS PCT: 64 % (ref 43–77)
PLATELETS: 192 10*3/uL (ref 150–400)
RBC: 5.72 MIL/uL (ref 4.22–5.81)
RDW: 13.7 % (ref 11.5–15.5)
WBC: 8.5 10*3/uL (ref 4.0–10.5)

## 2013-08-19 LAB — POCT GLYCOSYLATED HEMOGLOBIN (HGB A1C): Hemoglobin A1C: 8.8

## 2013-08-19 MED ORDER — METFORMIN HCL 1000 MG PO TABS: 1000.0000 mg | ORAL_TABLET | Freq: Two times a day (BID) | ORAL | Status: DC

## 2013-08-19 MED ORDER — AZITHROMYCIN 500 MG PO TABS: 500.0000 mg | ORAL_TABLET | Freq: Every day | ORAL | Status: DC

## 2013-08-19 MED ORDER — CETIRIZINE HCL 10 MG PO TABS: 10.0000 mg | ORAL_TABLET | Freq: Every day | ORAL | Status: DC

## 2013-08-19 MED ORDER — FLUTICASONE PROPIONATE 50 MCG/ACT NA SUSP: 2.0000 | Freq: Every day | NASAL | Status: DC

## 2013-08-19 MED ORDER — GLIPIZIDE 10 MG PO TABS: 10.0000 mg | ORAL_TABLET | Freq: Every day | ORAL | Status: DC

## 2013-08-19 MED ORDER — KETOCONAZOLE 2 % EX SHAM: MEDICATED_SHAMPOO | CUTANEOUS | Status: DC

## 2013-08-19 MED ORDER — TRAMADOL HCL 50 MG PO TABS: 50.0000 mg | ORAL_TABLET | Freq: Two times a day (BID) | ORAL | Status: DC | PRN

## 2013-08-19 MED ORDER — PRAVASTATIN SODIUM 80 MG PO TABS: 80.0000 mg | ORAL_TABLET | Freq: Every day | ORAL | Status: DC

## 2013-08-19 MED ORDER — PREDNISONE 20 MG PO TABS: 40.0000 mg | ORAL_TABLET | Freq: Two times a day (BID) | ORAL | Status: DC

## 2013-08-19 MED ORDER — ALBUTEROL SULFATE HFA 108 (90 BASE) MCG/ACT IN AERS: 2.0000 | INHALATION_SPRAY | Freq: Four times a day (QID) | RESPIRATORY_TRACT | Status: DC | PRN

## 2013-08-19 NOTE — Progress Notes (Signed)
Pt is here for a f/u. Requests medication refills. Pt also complains of being hot all the time.

## 2013-08-19 NOTE — Progress Notes (Signed)
Patient ID: Ryan Farmer, male   DOB: Sep 26, 1955, 58 y.o.   MRN: [id number]   CC:  HPI:  58 year-old male with a history of arthritis, diabetes presents today with a chief complaint of cough. He continues to smoke one pack a day. He has had a nonproductive cough for about 3 weeks, no reported fever, occasionally has a postnasal drip. Denies any chest pain, uses his albuterol inhaler twice a day although this is because the patient was not sure how to use an albuterol. Also takes Flonase.     No Known Allergies Past Medical History  Diagnosis Date  . Diabetes mellitus   . Arthritis    Current Outpatient Prescriptions on File Prior to Visit  Medication Sig Dispense Refill  . glucose blood (CHOICE DM FORA G20 TEST STRIPS) test strip Use as instructed  100 each  12  . Lancets (FREESTYLE) lancets Use as instructed  100 each  12  . calamine lotion Apply 1 application topically as needed (itching).      . diphenhydrAMINE (BENADRYL) 25 mg capsule Take 50 mg by mouth every 6 (six) hours as needed for itching.      Marland Kitchen HYDROcodone-acetaminophen (NORCO/VICODIN) 5-325 MG per tablet Take 1 tablet by mouth every 6 (six) hours as needed for pain.  65 tablet  0  . hydrocortisone cream 1 % Apply to affected area 2 times daily  15 g  3  . ibuprofen (ADVIL,MOTRIN) 800 MG tablet Take 1 tablet (800 mg total) by mouth every 8 (eight) hours as needed for pain.  30 tablet  0   No current facility-administered medications on file prior to visit.   Family History  Problem Relation Age of Onset  . Cancer Mother   . Stroke Father   . Heart disease Father   . Diabetes Father    History   Social History  . Marital Status: Single    Spouse Name: N/A    Number of Children: N/A  . Years of Education: N/A   Occupational History  . Not on file.   Social History Main Topics  . Smoking status: Current Every Day Smoker -- 1.00 packs/day    Types: Cigarettes  . Smokeless tobacco: Not on file  . Alcohol  Use: Yes     Comment: occ  . Drug Use: No  . Sexual Activity: Not on file   Other Topics Concern  . Not on file   Social History Narrative  . No narrative on file    Review of Systems  Constitutional: As in history of present illness  HENT: Negative for ear pain, nosebleeds, congestion, facial swelling, rhinorrhea, neck pain, neck stiffness and ear discharge.   Eyes: Negative for pain, discharge, redness, itching and visual disturbance.  Respiratory: As in history of present illness  Cardiovascular: Negative for chest pain, palpitations and leg swelling.  Gastrointestinal: Negative for abdominal distention.  Genitourinary: Negative for dysuria, urgency, frequency, hematuria, flank pain, decreased urine volume, difficulty urinating and dyspareunia.  Musculoskeletal: Negative for back pain, joint swelling, arthralgias and gait problem.  Neurological: Negative for dizziness, tremors, seizures, syncope, facial asymmetry, speech difficulty, weakness, light-headedness, numbness and headaches.  Hematological: Negative for adenopathy. Does not bruise/bleed easily.  Psychiatric/Behavioral: Negative for hallucinations, behavioral problems, confusion, dysphoric mood, decreased concentration and agitation.    Objective:   Filed Vitals:   08/19/13 1651  BP: 118/83  Pulse: 100  Temp: 98.6 F (37 C)  Resp: 14    Physical Exam  Constitutional: Appears well-developed and well-nourished. No distress.  HENT: Normocephalic. External right and left ear normal. Oropharynx is clear and moist.  Eyes: Conjunctivae and EOM are normal. PERRLA, no scleral icterus.  Neck: Normal ROM. Neck supple. No JVD. No tracheal deviation. No thyromegaly.  CVS: RRR, S1/S2 +, no murmurs, no gallops, no carotid bruit.  Pulmonary: Effort and breath sounds normal, no stridor, rhonchi, wheezes, rales.  Abdominal: Soft. BS +,  no distension, tenderness, rebound or guarding.  Musculoskeletal: Normal range of motion. No  edema and no tenderness.  Lymphadenopathy: No lymphadenopathy noted, cervical, inguinal. Neuro: Alert. Normal reflexes, muscle tone coordination. No cranial nerve deficit. Skin: Skin is warm and dry. No rash noted. Not diaphoretic. No erythema. No pallor.  Psychiatric: Normal mood and affect. Behavior, judgment, thought content normal.   Lab Results  Component Value Date   WBC 12.7* 01/07/2011   HGB 16.7 01/07/2011   HCT 49.0 01/07/2011   MCV 88.3 01/07/2011   PLT 217 01/07/2011   Lab Results  Component Value Date   CREATININE 1.14 11/22/2012   BUN 22 11/22/2012   NA 140 11/22/2012   K 4.9 11/22/2012   CL 103 11/22/2012   CO2 25 11/22/2012    Lab Results  Component Value Date   HGBA1C 7.5 04/01/2013   Lipid Panel     Component Value Date/Time   CHOL 184 11/22/2012 1535   TRIG 481* 11/22/2012 1535   HDL 35* 11/22/2012 1535   CHOLHDL 5.3 11/22/2012 1535   VLDL NOT CALC 11/22/2012 1535   LDLCALC Comment:   Not calculated due to Triglyceride >400. Suggest ordering Direct LDL (Unit Code: 93570).   Total Cholesterol/HDL Ratio:CHD Risk                        Coronary Heart Disease Risk Table                                        Men       Women          1/2 Average Risk              3.4        3.3              Average Risk              5.0        4.4           2X Average Risk              9.6        7.1           3X Average Risk             23.4       11.0 Use the calculated Patient Ratio above and the CHD Risk table  to determine the patient's CHD Risk. ATP III Classification (LDL):       < 100        mg/dL         Optimal      177 - 129     mg/dL         Near or Above Optimal      130 - 159     mg/dL         Borderline High  160 - 189     mg/dL         High       > 190        mg/dL         Very High   11/22/2012 1535       Assessment and plan:   Patient Active Problem List   Diagnosis Date Noted  . Diabetes 04/01/2013  . Medication refill 02/13/2013  . Type II or unspecified type diabetes  mellitus without mention of complication, uncontrolled 11/22/2012  . Unspecified essential hypertension 11/22/2012  . Chronic pain syndrome 11/22/2012  . Tobacco use disorder 11/22/2012  . Dental caries 11/22/2012   Acute bronchitis Given chronic cough the patient will be prescribed course of prednisone and azithromycin If the cough persists the patient will be referred for a chest x-ray Last chest x-ray was 6/14 that was negative for masses Patient counseled about quitting smoking, not interested at this time Refill Flonase, albuterol   Diabetes Refill all medications A1c today  Followup in 3 months        The patient was given clear instructions to go to ER or return to medical center if symptoms don't improve, worsen or new problems develop. The patient verbalized understanding. The patient was told to call to get any lab results if not heard anything in the next week.

## 2013-08-20 LAB — LIPID PANEL
CHOLESTEROL: 159 mg/dL (ref 0–200)
HDL: 35 mg/dL — AB (ref >39)
TRIGLYCERIDES: 545 mg/dL — AB (ref <150)
Total CHOL/HDL Ratio: 4.5 Ratio

## 2013-08-20 LAB — COMPLETE METABOLIC PANEL WITH GFR
ALBUMIN: 4.4 g/dL (ref 3.5–5.2)
ALK PHOS: 99 U/L (ref 39–117)
ALT: 56 U/L — ABNORMAL HIGH (ref 0–53)
AST: 30 U/L (ref 0–37)
BUN: 16 mg/dL (ref 6–23)
CO2: 23 meq/L (ref 19–32)
Calcium: 10.2 mg/dL (ref 8.4–10.5)
Chloride: 101 mEq/L (ref 96–112)
Creat: 0.97 mg/dL (ref 0.50–1.35)
GFR, EST NON AFRICAN AMERICAN: 87 mL/min
GLUCOSE: 369 mg/dL — AB (ref 70–99)
POTASSIUM: 4.4 meq/L (ref 3.5–5.3)
Sodium: 137 mEq/L (ref 135–145)
TOTAL PROTEIN: 6.5 g/dL (ref 6.0–8.3)
Total Bilirubin: 0.3 mg/dL (ref 0.2–1.2)

## 2013-08-20 MED ORDER — FREESTYLE LANCETS MISC: Status: DC

## 2013-08-20 MED ORDER — GEMFIBROZIL 600 MG PO TABS: 600.0000 mg | ORAL_TABLET | Freq: Two times a day (BID) | ORAL | Status: DC

## 2013-08-20 MED ORDER — INSULIN ASPART PROT & ASPART (70-30 MIX) 100 UNIT/ML ~~LOC~~ SUSP: 20.0000 [IU] | Freq: Two times a day (BID) | SUBCUTANEOUS | Status: DC

## 2013-08-20 NOTE — Addendum Note (Signed)
Addended by: Allyson Sabal MD, Ascencion Dike on: 08/20/2013 03:35 PM   Modules accepted: Orders

## 2013-08-20 NOTE — Addendum Note (Signed)
Addended by: Allyson Sabal MD, Ascencion Dike on: 08/20/2013 03:44 PM   Modules accepted: Orders

## 2013-08-21 ENCOUNTER — Telehealth: Payer: Self-pay | Admitting: *Deleted

## 2013-08-21 NOTE — Telephone Encounter (Signed)
Left a voicemail for patient to give us a call back.

## 2013-08-21 NOTE — Telephone Encounter (Signed)
Message copied by Yeraldy Spike, [place] R on Wed Aug 21, 2013 12:44 PM ------      Message from: Allyson Sabal MD, [facility]      Created: Tue Aug 20, 2013  3:43 PM       Patient patient is being started on insulin we have discontinued his metformin, prescription centers preferred pharmacy. Patient should continue checking his CBGs 3 times a day and return to [place] in one month. Triglycerides also 545. A prescription for Lopid 600 mg twice a day has been called into his preferred pharmacy ------

## 2013-08-26 ENCOUNTER — Telehealth: Payer: Self-pay | Admitting: *Deleted

## 2013-08-26 NOTE — Telephone Encounter (Signed)
Notified patient that he is being started on insulin we have discontinued his metformin, prescription centers preferred pharmacy. Patient should continue checking his CBGs 3 times a day and return to [place] in one month. Triglycerides also 545. A prescription for Lopid 600 mg twice a day has been called into his preferred pharmacy.

## 2013-09-01 ENCOUNTER — Encounter (HOSPITAL_COMMUNITY): Payer: Self-pay | Admitting: Emergency Medicine

## 2013-09-01 ENCOUNTER — Emergency Department (HOSPITAL_COMMUNITY)
Admission: EM | Admit: 2013-09-01 | Discharge: 2013-09-01 | Disposition: A | Discharge status: Home / Self Care | Payer: Medicare Other | Attending: Emergency Medicine | Admitting: Emergency Medicine

## 2013-09-01 DIAGNOSIS — R631 Polydipsia: Secondary | ICD-10-CM | POA: Insufficient documentation

## 2013-09-01 DIAGNOSIS — E119 Type 2 diabetes mellitus without complications: Secondary | ICD-10-CM | POA: Insufficient documentation

## 2013-09-01 DIAGNOSIS — R358 Other polyuria: Secondary | ICD-10-CM | POA: Insufficient documentation

## 2013-09-01 DIAGNOSIS — F17200 Nicotine dependence, unspecified, uncomplicated: Secondary | ICD-10-CM | POA: Diagnosis not present

## 2013-09-01 DIAGNOSIS — M129 Arthropathy, unspecified: Secondary | ICD-10-CM | POA: Diagnosis not present

## 2013-09-01 DIAGNOSIS — R3589 Other polyuria: Secondary | ICD-10-CM | POA: Insufficient documentation

## 2013-09-01 DIAGNOSIS — Z79899 Other long term (current) drug therapy: Secondary | ICD-10-CM | POA: Insufficient documentation

## 2013-09-01 DIAGNOSIS — E1165 Type 2 diabetes mellitus with hyperglycemia: Secondary | ICD-10-CM

## 2013-09-01 LAB — URINALYSIS, ROUTINE W REFLEX MICROSCOPIC
Bilirubin Urine: NEGATIVE
Glucose, UA: >1000 mg/dL — AB
Ketones, ur: NEGATIVE mg/dL
Leukocytes, UA: NEGATIVE
Nitrite: NEGATIVE
Protein, ur: NEGATIVE mg/dL
Specific Gravity, Urine: 1.035 — ABNORMAL HIGH (ref 1.005–1.030)
Urobilinogen, UA: 0.2 mg/dL (ref 0.0–1.0)
pH: 6 (ref 5.0–8.0)

## 2013-09-01 LAB — COMPREHENSIVE METABOLIC PANEL
ALT: 42 U/L (ref 0–53)
AST: 22 U/L (ref 0–37)
Albumin: 3.9 g/dL (ref 3.5–5.2)
Alkaline Phosphatase: 156 U/L — ABNORMAL HIGH (ref 39–117)
BUN: 26 mg/dL — ABNORMAL HIGH (ref 6–23)
CO2: 24 mEq/L (ref 19–32)
Calcium: 10.4 mg/dL (ref 8.4–10.5)
Chloride: 90 mEq/L — ABNORMAL LOW (ref 96–112)
Creatinine, Ser: 0.74 mg/dL (ref 0.50–1.35)
GFR calc Af Amer: >90 mL/min (ref >90)
GFR calc non Af Amer: >90 mL/min (ref >90)
Glucose, Bld: 505 mg/dL — ABNORMAL HIGH (ref 70–99)
Potassium: 4.7 mEq/L (ref 3.7–5.3)
Sodium: 129 mEq/L — ABNORMAL LOW (ref 137–147)
Total Bilirubin: <0.2 mg/dL — ABNORMAL LOW (ref 0.3–1.2)
Total Protein: 7.3 g/dL (ref 6.0–8.3)

## 2013-09-01 LAB — CBC
HCT: 50.4 % (ref 39.0–52.0)
Hemoglobin: 17.2 g/dL — ABNORMAL HIGH (ref 13.0–17.0)
MCH: 29.4 pg (ref 26.0–34.0)
MCHC: 34.1 g/dL (ref 30.0–36.0)
MCV: 86 fL (ref 78.0–100.0)
Platelets: 200 10*3/uL (ref 150–400)
RBC: 5.86 MIL/uL — ABNORMAL HIGH (ref 4.22–5.81)
RDW: 13.4 % (ref 11.5–15.5)
WBC: 8.2 10*3/uL (ref 4.0–10.5)

## 2013-09-01 LAB — URINE MICROSCOPIC-ADD ON

## 2013-09-01 LAB — CBG MONITORING, ED
Glucose-Capillary: 548 mg/dL — ABNORMAL HIGH (ref 70–99)
Glucose-Capillary: 355 mg/dL — ABNORMAL HIGH (ref 70–99)

## 2013-09-01 MED ORDER — INSULIN ASPART 100 UNIT/ML ~~LOC~~ SOLN
10.0000 [IU] | Freq: Once | SUBCUTANEOUS | Status: AC
  Administered 2013-09-01: 10 [IU] via INTRAVENOUS
  Filled 2013-09-01: qty 1

## 2013-09-01 MED ORDER — SODIUM CHLORIDE 0.9 % IV BOLUS (SEPSIS)
2000.0000 mL | Freq: Once | INTRAVENOUS | Status: AC
  Administered 2013-09-01: 2000 mL via INTRAVENOUS

## 2013-09-01 NOTE — ED Notes (Signed)
Notified RN of CBG 548

## 2013-09-01 NOTE — ED Notes (Signed)
CBG 355 

## 2013-09-01 NOTE — Discharge Instructions (Signed)
Take your blood sugar when you wake up in the morning, 1-2 hours after meals and if you happen to be up in the middle of the night. Record values/times and take with you to your next appointment.  High Blood Sugar High blood sugar (hyperglycemia) means that the level of sugar in your blood is higher than it should be. Signs of high blood sugar include:  Feeling thirsty.  Frequent peeing (urinating).  Feeling tired or sleepy.  Dry mouth.  Vision changes.  Feeling weak.  Feeling hungry but losing weight.  Numbness and tingling in your hands or feet.  Headache. When you ignore these signs, your blood sugar may keep going up. These problems may get worse, and other problems may begin. HOME CARE  Check your blood sugars as told by your doctor. Write down the numbers with the date and time.  Take the right amount of insulin or diabetes pills at the right time. Write down the dose with date and time.  Refill your insulin or diabetes pills before running out.  Watch what you eat. Follow your meal plan.  Drink liquids without sugar, such as water. Check with your doctor if you have kidney or heart disease.  Follow your doctor's orders for exercise. Exercise at the same time of day.  Keep your doctor's appointments. GET HELP RIGHT AWAY IF:   You have trouble thinking or are confused.  You have fast breathing with fruity smelling breath.  You pass out (faint).  You have 2 to 3 days of high blood sugars and you do not know why.  You have chest pain.  You are feeling sick to your stomach (nauseous) or throwing up (vomiting).  You have sudden vision changes. MAKE SURE YOU:   Understand these instructions.  Will watch your condition.  Will get help right away if you are not doing well or get worse. Document Released: 04/24/2009 Document Revised: 09/19/2011 Document Reviewed: 04/24/2009 [facility] Patient Information 2014 [place], [place].   Blood Glucose Monitoring,  Adult Monitoring your blood glucose (also know as blood sugar) helps you to manage your diabetes. It also helps you and your health care provider monitor your diabetes and determine how well your treatment plan is working. WHY SHOULD YOU MONITOR YOUR BLOOD GLUCOSE?  It can help you understand how food, exercise, and medicine affect your blood glucose.  It allows you to know what your blood glucose is at any given moment. You can quickly tell if you are having low blood glucose (hypoglycemia) or high blood glucose (hyperglycemia).  It can help you and your health care provider know how to adjust your medicines.  It can help you understand how to manage an illness or adjust medicine for exercise. WHEN SHOULD YOU TEST? Your health care provider will help you decide how often you should check your blood glucose. This may depend on the type of diabetes you have, your diabetes control, or the types of medicines you are taking. Be sure to write down all of your blood glucose readings so that this information can be reviewed with your health care provider. See below for examples of testing times that your health care provider may suggest. Type 1 Diabetes  Test 4 times a day if you are in good control, using an insulin pump, or perform multiple daily injections.  If your diabetes is not well-controlled or if you are sick, you may need to monitor more often.  It is a good idea to also monitor:  Before  and after exercise.  Between meals and 2 hours after a meal.  Occasionally between 2:00 to 3:00 am. Type 2 Diabetes  It can vary with each person, but generally, if you are on insulin, test 4 times a day.  If you take medicines by mouth (orally), test 2 times a day.  If you are on a controlled diet, test once a day.  If your diabetes is not well controlled or if you are sick, you may need to monitor more often. HOW TO MONITOR YOUR BLOOD GLUCOSE Supplies Needed  Blood glucose meter.  Test  strips for your meter. Each meter has its own strips. You must use the strips that go with your own meter.  A pricking needle (lancet).  A device that holds the lancet (lancing device).  A journal or log book to write down your results. Procedure  Wash your hands with soap and water. Alcohol is not preferred.  Prick the side of your finger (not the tip) with the lancet.  Gently milk the finger until a small drop of blood appears.  Follow the instructions that come with your meter for inserting the test strip, applying blood to the strip, and using your blood glucose meter. Other Areas to Get Blood for Testing Some meters allow you to use other areas of your body (other than your finger) to test your blood. These areas are called alternative sites. The most common alternative sites are:  The forearm.  The thigh.  The back area of the lower leg.  The palm of the hand. The blood flow in these areas is slower. Therefore, the blood glucose values you get may be delayed, and the numbers are different from what you would get from your fingers. Do not use alternative sites if you think you are having hypoglycemia. Your reading will not be accurate. Always use a finger if you are having hypoglycemia. Also, if you cannot feel your lows (hypoglycemia unawareness), always use your fingers for your blood glucose checks. ADDITIONAL TIPS FOR GLUCOSE MONITORING  Do not reuse lancets.  Always carry your supplies with you.  All blood glucose meters have a 24-hour "hotline" number to call if you have questions or need help.  Adjust (calibrate) your blood glucose meter with a control solution after finishing a few boxes of strips. BLOOD GLUCOSE RECORD KEEPING It is a good idea to keep a daily record or log of your blood glucose readings. Most glucose meters, if not all, keep your glucose records stored in the meter. Some meters come with the ability to download your records to your home computer.  Keeping a record of your blood glucose readings is especially helpful if you are wanting to look for patterns. Make notes to go along with the blood glucose readings because you might forget what happened at that exact time. Keeping good records helps you and your health care provider to work together to achieve good diabetes management.  Document Released: 06/30/2003 Document Revised: 02/27/2013 Document Reviewed: 11/19/2012 [facility] Patient Information 2014 [place].

## 2013-09-01 NOTE — ED Notes (Signed)
Pt reports that he has been vomiting for 20 years. Pt has been checking his blood sugar this week and has been running between 300-400. Pt reports increase thirst and frequent urination. Pt denies dizziness. CBG 548 during triage.

## 2013-09-01 NOTE — ED Provider Notes (Signed)
CSN: [id number]     Arrival date & time 09/01/13  2956 History   First MD Initiated Contact with Patient 09/01/13 [phone]     Chief Complaint  Patient presents with  . Emesis     (Consider location/radiation/quality/duration/timing/severity/associated sxs/prior Treatment) HPI  58 year old male with hyperglycemia. Patient is blood sugars have been running between 304 100 for the past 5 days to about a week. Associated polyuria and polydipsia. No other complaints. Patient reports compliance with his medicines. No fevers or chills. Recent cough which has since resolved after being treated for bronchitis. Treatment to include prednisone. Denies any significant change in his diet recently. Reports her blood sugars normally run in the 150-200 range. Past Medical History  Diagnosis Date  . Diabetes mellitus   . Arthritis    Past Surgical History  Procedure Laterality Date  . Knee surgrery     Family History  Problem Relation Age of Onset  . Cancer Mother   . Stroke Father   . Heart disease Father   . Diabetes Father    History  Substance Use Topics  . Smoking status: Current Every Day Smoker -- 1.00 packs/day    Types: Cigarettes  . Smokeless tobacco: Not on file  . Alcohol Use: Yes     Comment: occ    Review of Systems  All systems reviewed and negative, other than as noted in HPI.   Allergies  Review of patient's allergies indicates no known allergies.  Home Medications   Current Outpatient Rx  Name  Route  Sig  Dispense  Refill  . albuterol (PROVENTIL HFA;VENTOLIN HFA) 108 (90 BASE) MCG/ACT inhaler   Inhalation   Inhale 2 puffs into the lungs every 6 (six) hours as needed for wheezing.   1 Inhaler   3   . cetirizine (ZYRTEC ALLERGY) 10 MG tablet   Oral   Take 1 tablet (10 mg total) by mouth daily.   30 tablet   3   . gemfibrozil (LOPID) 600 MG tablet   Oral   Take 1 tablet (600 mg total) by mouth 2 (two) times daily before a meal.   60 tablet   3   .  glipiZIDE (GLUCOTROL) 10 MG tablet   Oral   Take 10 mg by mouth 2 (two) times daily before a meal.         . metFORMIN (GLUCOPHAGE) 1000 MG tablet   Oral   Take 1 tablet (1,000 mg total) by mouth 2 (two) times daily with a meal.   60 tablet   6   . traMADol (ULTRAM) 50 MG tablet   Oral   Take 1 tablet (50 mg total) by mouth every 12 (twelve) hours as needed.   45 tablet   0    BP 147/90  Pulse 93  Temp(Src) 97.9 F (36.6 C) (Oral)  Resp 18  Ht 6\' 1"  (1.854 m)  Wt 237 lb (107.502 kg)  BMI 31.27 kg/m2  SpO2 94% Physical Exam  Nursing note and vitals reviewed. Constitutional: He appears well-developed and well-nourished. No distress.  HENT:  Head: Normocephalic and atraumatic.  Eyes: Conjunctivae are normal. Right eye exhibits no discharge. Left eye exhibits no discharge.  Neck: Neck supple.  Cardiovascular: Normal rate, regular rhythm and normal heart sounds.  Exam reveals no gallop and no friction rub.   No murmur heard. Pulmonary/Chest: Effort normal and breath sounds normal. No respiratory distress.  Abdominal: Soft. He exhibits no distension. There is no tenderness.  Musculoskeletal: He  exhibits no edema and no tenderness.  Neurological: He is alert.  Skin: Skin is warm and dry.  Psychiatric: He has a normal mood and affect. His behavior is normal. Thought content normal.    ED Course  Procedures (including critical care time) Labs Review Labs Reviewed  CBC - Abnormal; Notable for the following:    RBC 5.86 (*)    Hemoglobin 17.2 (*)    All other components within normal limits  COMPREHENSIVE METABOLIC PANEL - Abnormal; Notable for the following:    Sodium 129 (*)    Chloride 90 (*)    Glucose, Bld 505 (*)    BUN 26 (*)    Alkaline Phosphatase 156 (*)    Total Bilirubin <0.2 (*)    All other components within normal limits  CBG MONITORING, ED - Abnormal; Notable for the following:    Glucose-Capillary 548 (*)    All other components within normal  limits  URINALYSIS, ROUTINE W REFLEX MICROSCOPIC   Imaging Review No results found.  EKG Interpretation   None       MDM   Final diagnoses:  Poorly controlled diabetes mellitus    58 year old male with hyperglycemia. Likely related to recent prednisone usage. Patient was started on prednisone approximately one week ago in addition to azithromycin and zyrtec for bronchitis. Timing of this corresponds to his increased hyperglycemia. He reports that his cough has resolved. He only has one more day of the prednisone and instructed to just go ahead and stop. He was treated with IV fluids and given a dose of insulin emergency room with improvement of his blood sugar. No metabolic acidosis. No ketonuria. Clinically does not appear toxic.   On review of records, recent note that pt is supposed to be starting on insulin and stop taking metformin. He reports that he has not done this. He does not feel comfortable with giving himself injections and doesn't understand why medications changed. He reports an upcoming appointment in approximately 2 weeks. Will have him continue to take his metformin and glipizide at this time. Blood sugars should improve with being off the steroids. Discussed that this is really conversation he needs to have with his prescribing provider and that he needs to feel comfortable with indications and how to administer his medicines. Discussed importance of regularly checking glucose and keeping good records as this is very important for further medication recommendations. Emergent return precautions discussed.     Virgel Manifold, MD 09/05/13 1415

## 2013-09-13 ENCOUNTER — Ambulatory Visit: Payer: Medicare Other | Attending: Internal Medicine | Admitting: Internal Medicine

## 2013-09-13 ENCOUNTER — Encounter: Payer: Self-pay | Admitting: Internal Medicine

## 2013-09-13 VITALS — BP 131/87 | HR 87 | Temp 98.0°F | Resp 16

## 2013-09-13 DIAGNOSIS — F17200 Nicotine dependence, unspecified, uncomplicated: Secondary | ICD-10-CM | POA: Diagnosis not present

## 2013-09-13 DIAGNOSIS — J40 Bronchitis, not specified as acute or chronic: Secondary | ICD-10-CM

## 2013-09-13 DIAGNOSIS — R059 Cough, unspecified: Secondary | ICD-10-CM

## 2013-09-13 DIAGNOSIS — Z760 Encounter for issue of repeat prescription: Secondary | ICD-10-CM

## 2013-09-13 DIAGNOSIS — E119 Type 2 diabetes mellitus without complications: Secondary | ICD-10-CM

## 2013-09-13 DIAGNOSIS — IMO0001 Reserved for inherently not codable concepts without codable children: Secondary | ICD-10-CM

## 2013-09-13 DIAGNOSIS — I10 Essential (primary) hypertension: Secondary | ICD-10-CM | POA: Diagnosis not present

## 2013-09-13 DIAGNOSIS — R05 Cough: Secondary | ICD-10-CM | POA: Insufficient documentation

## 2013-09-13 LAB — GLUCOSE, POCT (MANUAL RESULT ENTRY): POC GLUCOSE: 134 mg/dL — AB (ref 70–99)

## 2013-09-13 MED ORDER — GLIPIZIDE 10 MG PO TABS: 10.0000 mg | ORAL_TABLET | Freq: Two times a day (BID) | ORAL | Status: DC

## 2013-09-13 MED ORDER — HYDROCORTISONE 1 % EX CREA: 1.0000 "application " | TOPICAL_CREAM | Freq: Two times a day (BID) | CUTANEOUS | Status: DC

## 2013-09-13 MED ORDER — NICOTINE 21 MG/24HR TD PT24: 21.0000 mg | MEDICATED_PATCH | Freq: Every day | TRANSDERMAL | Status: DC

## 2013-09-13 MED ORDER — AMOXICILLIN-POT CLAVULANATE 875-125 MG PO TABS: 1.0000 | ORAL_TABLET | Freq: Two times a day (BID) | ORAL | Status: DC

## 2013-09-13 MED ORDER — TRAMADOL HCL 50 MG PO TABS: 50.0000 mg | ORAL_TABLET | Freq: Two times a day (BID) | ORAL | Status: DC | PRN

## 2013-09-13 MED ORDER — METFORMIN HCL 1000 MG PO TABS
1000.0000 mg | ORAL_TABLET | Freq: Two times a day (BID) | ORAL | Status: DC
Start: 2013-09-13 — End: 2014-05-05

## 2013-09-13 MED ORDER — BENZONATATE 100 MG PO CAPS: 100.0000 mg | ORAL_CAPSULE | Freq: Three times a day (TID) | ORAL | Status: DC | PRN

## 2013-09-13 NOTE — Patient Instructions (Signed)

## 2013-09-13 NOTE — Progress Notes (Signed)
MRN: [id number] Name: Ryan Farmer  Sex: male Age: 58 y.o. DOB: 10-09-1955  Allergies: Review of patient's allergies indicates no known allergies.  Chief Complaint  Patient presents with  . URI    HPI: Patient is 58 y.o. male who complains of productive cough nasal congestion chills for the last few days, patient has history of bronchitis last month he was treated with Z-Pak and prednisone, patient is having similar symptoms he has used his albuterol, patient does smoke cigarettes everyday, advised to quit smoking, he is going to try nicotine patch, he also has history of diabetes and is requesting refill on the medications currently his fasting sugar is around 130 mg/dL , also patient has chronic pain and is requesting refill on tramadol.  Past Medical History  Diagnosis Date  . Diabetes mellitus   . Arthritis     Past Surgical History  Procedure Laterality Date  . Knee surgrery        Medication List       This list is accurate as of: 09/13/13  3:02 PM.  Always use your most recent med list.               albuterol 108 (90 BASE) MCG/ACT inhaler  Commonly known as:  PROVENTIL HFA;VENTOLIN HFA  Inhale 2 puffs into the lungs every 6 (six) hours as needed for wheezing.     amoxicillin-clavulanate 875-125 MG per tablet  Commonly known as:  AUGMENTIN  Take 1 tablet by mouth 2 (two) times daily.     benzonatate 100 MG capsule  Commonly known as:  TESSALON  Take 1 capsule (100 mg total) by mouth 3 (three) times daily as needed for cough.     cetirizine 10 MG tablet  Commonly known as:  ZYRTEC ALLERGY  Take 1 tablet (10 mg total) by mouth daily.     gemfibrozil 600 MG tablet  Commonly known as:  LOPID  Take 1 tablet (600 mg total) by mouth 2 (two) times daily before a meal.     glipiZIDE 10 MG tablet  Commonly known as:  GLUCOTROL  Take 1 tablet (10 mg total) by mouth 2 (two) times daily before a meal.     metFORMIN 1000 MG tablet  Commonly known as:   GLUCOPHAGE  Take 1 tablet (1,000 mg total) by mouth 2 (two) times daily with a meal.     nicotine 21 mg/24hr patch  Commonly known as:  NICODERM CQ  Place 1 patch (21 mg total) onto the skin daily.     traMADol 50 MG tablet  Commonly known as:  ULTRAM  Take 1 tablet (50 mg total) by mouth every 12 (twelve) hours as needed.        Meds ordered this encounter  Medications  . metFORMIN (GLUCOPHAGE) 1000 MG tablet    Sig: Take 1 tablet (1,000 mg total) by mouth 2 (two) times daily with a meal.    Dispense:  60 tablet    Refill:  6  . traMADol (ULTRAM) 50 MG tablet    Sig: Take 1 tablet (50 mg total) by mouth every 12 (twelve) hours as needed.    Dispense:  45 tablet    Refill:  0  . glipiZIDE (GLUCOTROL) 10 MG tablet    Sig: Take 1 tablet (10 mg total) by mouth 2 (two) times daily before a meal.    Dispense:  60 tablet    Refill:  2  . benzonatate (TESSALON) 100 MG capsule  Sig: Take 1 capsule (100 mg total) by mouth 3 (three) times daily as needed for cough.    Dispense:  30 capsule    Refill:  1  . nicotine (NICODERM CQ) 21 mg/24hr patch    Sig: Place 1 patch (21 mg total) onto the skin daily.    Dispense:  28 patch    Refill:  0  . amoxicillin-clavulanate (AUGMENTIN) 875-125 MG per tablet    Sig: Take 1 tablet by mouth 2 (two) times daily.    Dispense:  20 tablet    Refill:  0     There is no immunization history on file for this patient.  Family History  Problem Relation Age of Onset  . Cancer Mother   . Stroke Father   . Heart disease Father   . Diabetes Father     History  Substance Use Topics  . Smoking status: Current Every Day Smoker -- 1.00 packs/day    Types: Cigarettes  . Smokeless tobacco: Not on file  . Alcohol Use: Yes     Comment: occ    Review of Systems   As noted in HPI  Filed Vitals:   09/13/13 1447  BP: 131/87  Pulse: 87  Temp: 98 F (36.7 C)  Resp: 16    Physical Exam  Physical Exam  HENT:  Some nasal congestion    Eyes: EOM are normal. Pupils are equal, round, and reactive to light.  Cardiovascular: Normal rate and regular rhythm.   Pulmonary/Chest: Breath sounds normal. No respiratory distress. He has no rales.  Minimal wheezing    CBC    Component Value Date/Time   WBC 8.2 09/01/2013 0754   RBC 5.86* 09/01/2013 0754   HGB 17.2* 09/01/2013 0754   HCT 50.4 09/01/2013 0754   PLT 200 09/01/2013 0754   MCV 86.0 09/01/2013 0754   LYMPHSABS 2.2 08/19/2013 1726   MONOABS 0.7 08/19/2013 1726   EOSABS 0.2 08/19/2013 1726   BASOSABS 0.0 08/19/2013 1726    CMP     Component Value Date/Time   NA 129* 09/01/2013 0754   K 4.7 09/01/2013 0754   CL 90* 09/01/2013 0754   CO2 24 09/01/2013 0754   GLUCOSE 505* 09/01/2013 0754   BUN 26* 09/01/2013 0754   CREATININE 0.74 09/01/2013 0754   CREATININE 0.97 08/19/2013 1726   CALCIUM 10.4 09/01/2013 0754   PROT 7.3 09/01/2013 0754   ALBUMIN 3.9 09/01/2013 0754   AST 22 09/01/2013 0754   ALT 42 09/01/2013 0754   ALKPHOS 156* 09/01/2013 0754   BILITOT <0.2* 09/01/2013 0754   GFRNONAA >90 09/01/2013 0754   GFRAA >90 09/01/2013 0754    Lab Results  Component Value Date/Time   CHOL 159 08/19/2013  5:26 PM    No components found with this basename: hga1c    Lab Results  Component Value Date/Time   AST 22 09/01/2013  7:54 AM    Assessment and Plan  Medication refill - Plan: metFORMIN (GLUCOPHAGE) 1000 MG tablet  Diabetes - Plan: metFORMIN (GLUCOPHAGE) 1000 MG tablet, Glucose (CBG)  Results for orders placed in visit on 09/13/13  GLUCOSE, POCT (MANUAL RESULT ENTRY)      Result Value Ref Range   POC Glucose 134 (*) 70 - 99 mg/dl    Bronchitis - Plan: amoxicillin-clavulanate (AUGMENTIN) 875-125 MG per tablet  Cough - Plan: benzonatate (TESSALON) 100 MG capsule  Smoking - Plan: nicotine (NICODERM CQ) 21 mg/24hr patch   Return in about 3 months (around 12/14/2013).  Lorayne Marek, MD

## 2013-09-13 NOTE — Progress Notes (Signed)
Patient here for cold symptoms Congestion cough hot and cold Needs medications refilled

## 2013-10-30 ENCOUNTER — Telehealth: Payer: Self-pay | Admitting: Internal Medicine

## 2013-10-30 NOTE — Telephone Encounter (Signed)
Pt has come in requesting medication refill for traMADol (ULTRAM) 50 MG tablet; please f/u with pt as his next appt is 5/11 @4 :00pm

## 2013-11-05 ENCOUNTER — Telehealth: Payer: Self-pay

## 2013-11-05 ENCOUNTER — Other Ambulatory Visit: Payer: Self-pay | Admitting: Internal Medicine

## 2013-11-05 MED ORDER — TRAMADOL HCL 50 MG PO TABS: 50.0000 mg | ORAL_TABLET | Freq: Two times a day (BID) | ORAL | Status: DC | PRN

## 2013-11-05 NOTE — Telephone Encounter (Signed)
Patient is requesting a refill on his tramadol

## 2013-11-05 NOTE — Telephone Encounter (Signed)
Pt may come pick up Rx

## 2013-11-05 NOTE — Telephone Encounter (Signed)
Patient is aware his tramadol prescription is ready for pick up

## 2013-11-18 ENCOUNTER — Encounter: Payer: Self-pay | Admitting: Internal Medicine

## 2013-11-18 ENCOUNTER — Ambulatory Visit: Payer: Medicare Other | Attending: Internal Medicine | Admitting: Internal Medicine

## 2013-11-18 VITALS — BP 142/85 | HR 86 | Temp 97.7°F | Resp 16 | Wt 231.0 lb

## 2013-11-18 DIAGNOSIS — E785 Hyperlipidemia, unspecified: Secondary | ICD-10-CM | POA: Insufficient documentation

## 2013-11-18 DIAGNOSIS — M5380 Other specified dorsopathies, site unspecified: Secondary | ICD-10-CM | POA: Diagnosis not present

## 2013-11-18 DIAGNOSIS — IMO0001 Reserved for inherently not codable concepts without codable children: Secondary | ICD-10-CM | POA: Diagnosis not present

## 2013-11-18 DIAGNOSIS — H538 Other visual disturbances: Secondary | ICD-10-CM | POA: Insufficient documentation

## 2013-11-18 DIAGNOSIS — E781 Pure hyperglyceridemia: Secondary | ICD-10-CM | POA: Insufficient documentation

## 2013-11-18 DIAGNOSIS — M25519 Pain in unspecified shoulder: Secondary | ICD-10-CM | POA: Diagnosis not present

## 2013-11-18 DIAGNOSIS — M62830 Muscle spasm of back: Secondary | ICD-10-CM | POA: Insufficient documentation

## 2013-11-18 DIAGNOSIS — M129 Arthropathy, unspecified: Secondary | ICD-10-CM | POA: Diagnosis not present

## 2013-11-18 DIAGNOSIS — M25512 Pain in left shoulder: Secondary | ICD-10-CM

## 2013-11-18 DIAGNOSIS — E119 Type 2 diabetes mellitus without complications: Secondary | ICD-10-CM

## 2013-11-18 DIAGNOSIS — F17200 Nicotine dependence, unspecified, uncomplicated: Secondary | ICD-10-CM | POA: Diagnosis not present

## 2013-11-18 DIAGNOSIS — E1165 Type 2 diabetes mellitus with hyperglycemia: Principal | ICD-10-CM

## 2013-11-18 LAB — POCT GLYCOSYLATED HEMOGLOBIN (HGB A1C)

## 2013-11-18 LAB — GLUCOSE, POCT (MANUAL RESULT ENTRY): POC Glucose: 254 mg/dl — AB (ref 70–99)

## 2013-11-18 MED ORDER — GLIPIZIDE 10 MG PO TABS
20.0000 mg | ORAL_TABLET | Freq: Two times a day (BID) | ORAL | Status: DC
Start: 2013-11-18 — End: 2014-05-05

## 2013-11-18 MED ORDER — TRAMADOL HCL 50 MG PO TABS: 50.0000 mg | ORAL_TABLET | Freq: Two times a day (BID) | ORAL | Status: DC | PRN

## 2013-11-18 MED ORDER — BACLOFEN 10 MG PO TABS: 10.0000 mg | ORAL_TABLET | Freq: Every day | ORAL | Status: DC

## 2013-11-18 MED ORDER — GEMFIBROZIL 600 MG PO TABS: 600.0000 mg | ORAL_TABLET | Freq: Two times a day (BID) | ORAL | Status: DC

## 2013-11-18 NOTE — Progress Notes (Signed)
MRN: [id number] Name: Ryan Farmer  Sex: male Age: 59 y.o. DOB: 03/22/1956  Allergies: Review of patient's allergies indicates no known allergies.  Chief Complaint  Patient presents with  . Follow-up    HPI: Patient is 58 y.o. male who has to of diabetes hypertriglyceridemia hypertension comes today for followup, as per patient is compliant in taking his medication currently is taking metformin and Glucotrol 10 mg twice a day, his fasting sugar is usually high, denies any hypoglycemic symptoms, reported to have left shoulder pain for the last one week denies any fall or trauma , also has chronic low back pain requesting refill on his tramadol. Her patient is to smoke cigarettes, I have advised patient to quit smoking. Patient also reported to have blurry vision, he saw ophthalmologist 3 or 4 years ago.  Past Medical History  Diagnosis Date  . Diabetes mellitus   . Arthritis     Past Surgical History  Procedure Laterality Date  . Knee surgrery        Medication List       This list is accurate as of: 11/18/13  4:32 PM.  Always use your most recent med list.               albuterol 108 (90 BASE) MCG/ACT inhaler  Commonly known as:  PROVENTIL HFA;VENTOLIN HFA  Inhale 2 puffs into the lungs every 6 (six) hours as needed for wheezing.     amoxicillin-clavulanate 875-125 MG per tablet  Commonly known as:  AUGMENTIN  Take 1 tablet by mouth 2 (two) times daily.     baclofen 10 MG tablet  Commonly known as:  LIORESAL  Take 1 tablet (10 mg total) by mouth at bedtime.     benzonatate 100 MG capsule  Commonly known as:  TESSALON  Take 1 capsule (100 mg total) by mouth 3 (three) times daily as needed for cough.     cetirizine 10 MG tablet  Commonly known as:  ZYRTEC ALLERGY  Take 1 tablet (10 mg total) by mouth daily.     gemfibrozil 600 MG tablet  Commonly known as:  LOPID  Take 1 tablet (600 mg total) by mouth 2 (two) times daily before a meal.     glipiZIDE 10 MG  tablet  Commonly known as:  GLUCOTROL  Take 2 tablets (20 mg total) by mouth 2 (two) times daily before a meal.     hydrocortisone cream 1 %  Apply 1 application topically 2 (two) times daily.     metFORMIN 1000 MG tablet  Commonly known as:  GLUCOPHAGE  Take 1 tablet (1,000 mg total) by mouth 2 (two) times daily with a meal.     nicotine 21 mg/24hr patch  Commonly known as:  NICODERM CQ  Place 1 patch (21 mg total) onto the skin daily.     traMADol 50 MG tablet  Commonly known as:  ULTRAM  Take 1 tablet (50 mg total) by mouth every 12 (twelve) hours as needed.        Meds ordered this encounter  Medications  . glipiZIDE (GLUCOTROL) 10 MG tablet    Sig: Take 2 tablets (20 mg total) by mouth 2 (two) times daily before a meal.    Dispense:  120 tablet    Refill:  2  . traMADol (ULTRAM) 50 MG tablet    Sig: Take 1 tablet (50 mg total) by mouth every 12 (twelve) hours as needed.    Dispense:  45  tablet    Refill:  0  . baclofen (LIORESAL) 10 MG tablet    Sig: Take 1 tablet (10 mg total) by mouth at bedtime.    Dispense:  30 each    Refill:  2  . gemfibrozil (LOPID) 600 MG tablet    Sig: Take 1 tablet (600 mg total) by mouth 2 (two) times daily before a meal.    Dispense:  60 tablet    Refill:  3     There is no immunization history on file for this patient.  Family History  Problem Relation Age of Onset  . Cancer Mother   . Stroke Father   . Heart disease Father   . Diabetes Father     History  Substance Use Topics  . Smoking status: Current Every Day Smoker -- 1.00 packs/day    Types: Cigarettes  . Smokeless tobacco: Not on file  . Alcohol Use: Yes     Comment: occ    Review of Systems   As noted in HPI  Filed Vitals:   11/18/13 1558  BP: 142/85  Pulse: 86  Temp: 97.7 F (36.5 C)  Resp: 16    Physical Exam  Physical Exam  Constitutional: No distress.  Eyes: EOM are normal. Pupils are equal, round, and reactive to light.  Cardiovascular:  Normal rate and regular rhythm.   Pulmonary/Chest: Breath sounds normal. No respiratory distress. He has no wheezes. He has no rales.  Musculoskeletal:  Minimal tenderness anteriorly on left shoulder  Bilateral lower back paraspinal tenderness, SLR test negative, equal strength both lower extremities.    CBC    Component Value Date/Time   WBC 8.2 09/01/2013 0754   RBC 5.86* 09/01/2013 0754   HGB 17.2* 09/01/2013 0754   HCT 50.4 09/01/2013 0754   PLT 200 09/01/2013 0754   MCV 86.0 09/01/2013 0754   LYMPHSABS 2.2 08/19/2013 1726   MONOABS 0.7 08/19/2013 1726   EOSABS 0.2 08/19/2013 1726   BASOSABS 0.0 08/19/2013 1726    CMP     Component Value Date/Time   NA 129* 09/01/2013 0754   K 4.7 09/01/2013 0754   CL 90* 09/01/2013 0754   CO2 24 09/01/2013 0754   GLUCOSE 505* 09/01/2013 0754   BUN 26* 09/01/2013 0754   CREATININE 0.74 09/01/2013 0754   CREATININE 0.97 08/19/2013 1726   CALCIUM 10.4 09/01/2013 0754   PROT 7.3 09/01/2013 0754   ALBUMIN 3.9 09/01/2013 0754   AST 22 09/01/2013 0754   ALT 42 09/01/2013 0754   ALKPHOS 156* 09/01/2013 0754   BILITOT <0.2* 09/01/2013 0754   GFRNONAA >90 09/01/2013 0754   GFRNONAA 87 08/19/2013 1726   GFRAA >90 09/01/2013 0754   GFRAA >89 08/19/2013 1726    Lab Results  Component Value Date/Time   CHOL 159 08/19/2013  5:26 PM    No components found with this basename: hga1c    Lab Results  Component Value Date/Time   AST 22 09/01/2013  7:54 AM    Assessment and Plan  DM (diabetes mellitus) - Plan:  Results for orders placed in visit on 11/18/13  GLUCOSE, POCT (MANUAL RESULT ENTRY)      Result Value Ref Range   POC Glucose 254 (*) 70 - 99 mg/dl  POCT GLYCOSYLATED HEMOGLOBIN (HGB A1C)      Result Value Ref Range   Hemoglobin A1C 10.4%     Diabetes is uncontrolled, his A1c trending up, I have increased the dose of Glucotrol to 20 mg 3  times a day, continue with metformin 1 g twice a day, advise patient to keep the fingerstick log.   Glucose (CBG), HgB  A1c, glipiZIDE (GLUCOTROL) 10 MG tablet  Left shoulder pain - Plan: DG Shoulder Left, traMADol (ULTRAM) 50 MG tablet when necessary for pain  Tobacco use disorder Advised patient to quit smoking. Patient has already been prescribed nicotine patch.  Back muscle spasm - Plan: baclofen (LIORESAL) 10 MG tablet, advised patient to apply heating pad.  Blurry vision - Plan: Ambulatory referral to Ophthalmology  Hypertriglyceridemia - Plan: gemfibrozil (LOPID) 600 MG tablet, will repeat fasting lipid panel on the next visit.   Return in about 3 months (around 02/18/2014) for diabetes, hypertriglycridemia.  Lorayne Marek, MD

## 2013-11-18 NOTE — Patient Instructions (Signed)

## 2013-11-18 NOTE — Progress Notes (Signed)
Patient here for follow up on his diabetes Needs refill on his tramadol

## 2014-01-26 ENCOUNTER — Inpatient Hospital Stay (HOSPITAL_COMMUNITY): Admission: EM | Admit: 2014-01-26 | Payer: Self-pay

## 2014-01-26 ENCOUNTER — Ambulatory Visit (HOSPITAL_COMMUNITY)
Admission: RE | Admit: 2014-01-26 | Discharge: 2014-01-26 | Disposition: A | Discharge status: Home / Self Care | Payer: Medicare Other | Source: Ambulatory Visit | Attending: Internal Medicine | Admitting: Internal Medicine

## 2014-01-26 DIAGNOSIS — M259 Joint disorder, unspecified: Secondary | ICD-10-CM | POA: Diagnosis not present

## 2014-01-26 DIAGNOSIS — M25519 Pain in unspecified shoulder: Secondary | ICD-10-CM | POA: Insufficient documentation

## 2014-01-26 DIAGNOSIS — M25512 Pain in left shoulder: Secondary | ICD-10-CM

## 2014-01-27 ENCOUNTER — Telehealth: Payer: Self-pay

## 2014-01-27 NOTE — Telephone Encounter (Signed)
Patient not available Left message on voice mail that his x ray was normal

## 2014-01-27 NOTE — Telephone Encounter (Signed)
Message copied by Dorothe Pea on Mon Jan 27, 2014 12:06 PM ------      Message from: Lorayne Marek      Created: Mon Jan 27, 2014 10:00 AM       Call and let  the patient know that his left shoulder x-ray is reported to be normal. ------

## 2014-02-27 ENCOUNTER — Encounter (HOSPITAL_COMMUNITY): Payer: Self-pay | Admitting: Emergency Medicine

## 2014-02-27 ENCOUNTER — Emergency Department (INDEPENDENT_AMBULATORY_CARE_PROVIDER_SITE_OTHER)
Admission: EM | Admit: 2014-02-27 | Discharge: 2014-02-27 | Disposition: A | Discharge status: Home / Self Care | Payer: Medicare Other | Source: Home / Self Care | Attending: Family Medicine | Admitting: Family Medicine

## 2014-02-27 DIAGNOSIS — J0100 Acute maxillary sinusitis, unspecified: Secondary | ICD-10-CM

## 2014-02-27 DIAGNOSIS — J441 Chronic obstructive pulmonary disease with (acute) exacerbation: Secondary | ICD-10-CM | POA: Diagnosis not present

## 2014-02-27 MED ORDER — ALBUTEROL SULFATE (2.5 MG/3ML) 0.083% IN NEBU
INHALATION_SOLUTION | RESPIRATORY_TRACT | Status: AC
Start: 2014-02-27 — End: 2014-02-27
  Filled 2014-02-27: qty 3

## 2014-02-27 MED ORDER — IPRATROPIUM-ALBUTEROL 0.5-2.5 (3) MG/3ML IN SOLN
3.0000 mL | Freq: Once | RESPIRATORY_TRACT | Status: AC
  Administered 2014-02-27: 3 mL via RESPIRATORY_TRACT

## 2014-02-27 MED ORDER — DOXYCYCLINE HYCLATE 100 MG PO TABS: 100.0000 mg | ORAL_TABLET | Freq: Two times a day (BID) | ORAL | Status: DC

## 2014-02-27 MED ORDER — PREDNISONE 50 MG PO TABS
ORAL_TABLET | ORAL | Status: DC
Start: 2014-02-27 — End: 2014-09-18

## 2014-02-27 MED ORDER — IPRATROPIUM BROMIDE 0.02 % IN SOLN
RESPIRATORY_TRACT | Status: AC
  Filled 2014-02-27: qty 2.5

## 2014-02-27 MED ORDER — METHYLPREDNISOLONE SODIUM SUCC 125 MG IJ SOLR
INTRAMUSCULAR | Status: AC
  Filled 2014-02-27: qty 2

## 2014-02-27 MED ORDER — METHYLPREDNISOLONE SODIUM SUCC 125 MG IJ SOLR
125.0000 mg | Freq: Once | INTRAMUSCULAR | Status: AC
  Administered 2014-02-27: 125 mg via INTRAMUSCULAR

## 2014-02-27 NOTE — ED Notes (Signed)
C/o URI symptoms after exposed to brother; NAD at present

## 2014-02-27 NOTE — Discharge Instructions (Signed)
You are likely suffering from a sinus infection as well as an asthma or COPD flare Doxycycline, an antibiotic, will treat both these conditions Please also take the prednisone to help your breathing Pelase use your albuterol every 4 hours for the next 1-2 days, then as needed Please follow up here or with your PCP as needed.   Chronic Obstructive Pulmonary Disease Chronic obstructive pulmonary disease (COPD) is a common lung condition in which airflow from the lungs is limited. COPD is a general term that can be used to describe many different lung problems that limit airflow, including both chronic bronchitis and emphysema. If you have COPD, your lung function will probably never return to normal, but there are measures you can take to improve lung function and make yourself feel better.  CAUSES   Smoking (common).   Exposure to secondhand smoke.   Genetic problems.  Chronic inflammatory lung diseases or recurrent infections. SYMPTOMS   Shortness of breath, especially with physical activity.   Deep, persistent (chronic) cough with a large amount of thick mucus.   Wheezing.   Rapid breaths (tachypnea).   Gray or bluish discoloration (cyanosis) of the skin, especially in fingers, toes, or lips.   Fatigue.   Weight loss.   Frequent infections or episodes when breathing symptoms become much worse (exacerbations).   Chest tightness. DIAGNOSIS  Your health care provider will take a medical history and perform a physical examination to make the initial diagnosis. Additional tests for COPD may include:   Lung (pulmonary) function tests.  Chest X-ray.  CT scan.  Blood tests. TREATMENT  Treatment available to help you feel better when you have COPD includes:   Inhaler and nebulizer medicines. These help manage the symptoms of COPD and make your breathing more comfortable.  Supplemental oxygen. Supplemental oxygen is only helpful if you have a low oxygen level in  your blood.   Exercise and physical activity. These are beneficial for nearly all people with COPD. Some people may also benefit from a pulmonary rehabilitation program. HOME CARE INSTRUCTIONS   Take all medicines (inhaled or pills) as directed by your health care provider.  Avoid over-the-counter medicines or cough syrups that dry up your airway (such as antihistamines) and slow down the elimination of secretions unless instructed otherwise by your health care provider.   If you are a smoker, the most important thing that you can do is stop smoking. Continuing to smoke will cause further lung damage and breathing trouble. Ask your health care provider for help with quitting smoking. He or she can direct you to community resources or hospitals that provide support.  Avoid exposure to irritants such as smoke, chemicals, and fumes that aggravate your breathing.  Use oxygen therapy and pulmonary rehabilitation if directed by your health care provider. If you require home oxygen therapy, ask your health care provider whether you should purchase a pulse oximeter to measure your oxygen level at home.   Avoid contact with individuals who have a contagious illness.  Avoid extreme temperature and humidity changes.  Eat healthy foods. Eating smaller, more frequent meals and resting before meals may help you maintain your strength.  Stay active, but balance activity with periods of rest. Exercise and physical activity will help you maintain your ability to do things you want to do.  Preventing infection and hospitalization is very important when you have COPD. Make sure to receive all the vaccines your health care provider recommends, especially the pneumococcal and influenza vaccines. Ask your  health care provider whether you need a pneumonia vaccine.  Learn and use relaxation techniques to manage stress.  Learn and use controlled breathing techniques as directed by your health care provider.  Controlled breathing techniques include:   Pursed lip breathing. Start by breathing in (inhaling) through your nose for 1 second. Then, purse your lips as if you were going to whistle and breathe out (exhale) through the pursed lips for 2 seconds.   Diaphragmatic breathing. Start by putting one hand on your abdomen just above your waist. Inhale slowly through your nose. The hand on your abdomen should move out. Then purse your lips and exhale slowly. You should be able to feel the hand on your abdomen moving in as you exhale.   Learn and use controlled coughing to clear mucus from your lungs. Controlled coughing is a series of short, progressive coughs. The steps of controlled coughing are:  1. Lean your head slightly forward.  2. Breathe in deeply using diaphragmatic breathing.  3. Try to hold your breath for 3 seconds.  4. Keep your mouth slightly open while coughing twice.  5. Spit any mucus out into a tissue.  6. Rest and repeat the steps once or twice as needed. SEEK MEDICAL CARE IF:   You are coughing up more mucus than usual.   There is a change in the color or thickness of your mucus.   Your breathing is more labored than usual.   Your breathing is faster than usual.  SEEK IMMEDIATE MEDICAL CARE IF:   You have shortness of breath while you are resting.   You have shortness of breath that prevents you from:  Being able to talk.   Performing your usual physical activities.   You have chest pain lasting longer than 5 minutes.   Your skin color is more cyanotic than usual.  You measure low oxygen saturations for longer than 5 minutes with a pulse oximeter. MAKE SURE YOU:   Understand these instructions.  Will watch your condition.  Will get help right away if you are not doing well or get worse. Document Released: 04/06/2005 Document Revised: 11/11/2013 Document Reviewed: 02/21/2013 [facility] Patient Information 2015 [place], [place]. This information  is not intended to replace advice given to you by your health care provider. Make sure you discuss any questions you have with your health care provider. Sinusitis Sinusitis is redness, soreness, and inflammation of the paranasal sinuses. Paranasal sinuses are air pockets within the bones of your face (beneath the eyes, the middle of the forehead, or above the eyes). In healthy paranasal sinuses, mucus is able to drain out, and air is able to circulate through them by way of your nose. However, when your paranasal sinuses are inflamed, mucus and air can become trapped. This can allow bacteria and other germs to grow and cause infection. Sinusitis can develop quickly and last only a short time (acute) or continue over a long period (chronic). Sinusitis that lasts for more than 12 weeks is considered chronic.  CAUSES  Causes of sinusitis include:  Allergies.  Structural abnormalities, such as displacement of the cartilage that separates your nostrils (deviated septum), which can decrease the air flow through your nose and sinuses and affect sinus drainage.  Functional abnormalities, such as when the small hairs (cilia) that line your sinuses and help remove mucus do not work properly or are not present. SIGNS AND SYMPTOMS  Symptoms of acute and chronic sinusitis are the same. The primary symptoms are pain and  pressure around the affected sinuses. Other symptoms include:  Upper toothache.  Earache.  Headache.  Bad breath.  Decreased sense of smell and taste.  A cough, which worsens when you are lying flat.  Fatigue.  Fever.  Thick drainage from your nose, which often is green and may contain pus (purulent).  Swelling and warmth over the affected sinuses. DIAGNOSIS  Your health care provider will perform a physical exam. During the exam, your health care provider may:  Look in your nose for signs of abnormal growths in your nostrils (nasal polyps).  Tap over the affected sinus to  check for signs of infection.  View the inside of your sinuses (endoscopy) using an imaging device that has a light attached (endoscope). If your health care provider suspects that you have chronic sinusitis, one or more of the following tests may be recommended:  Allergy tests.  Nasal culture. A sample of mucus is taken from your nose, sent to a lab, and screened for bacteria.  Nasal cytology. A sample of mucus is taken from your nose and examined by your health care provider to determine if your sinusitis is related to an allergy. TREATMENT  Most cases of acute sinusitis are related to a viral infection and will resolve on their own within 10 days. Sometimes medicines are prescribed to help relieve symptoms (pain medicine, decongestants, nasal steroid sprays, or saline sprays).  However, for sinusitis related to a bacterial infection, your health care provider will prescribe antibiotic medicines. These are medicines that will help kill the bacteria causing the infection.  Rarely, sinusitis is caused by a fungal infection. In theses cases, your health care provider will prescribe antifungal medicine. For some cases of chronic sinusitis, surgery is needed. Generally, these are cases in which sinusitis recurs more than 3 times per year, despite other treatments. HOME CARE INSTRUCTIONS   Drink plenty of water. Water helps thin the mucus so your sinuses can drain more easily.  Use a humidifier.  Inhale steam 3 to 4 times a day (for example, sit in the bathroom with the shower running).  Apply a warm, moist washcloth to your face 3 to 4 times a day, or as directed by your health care provider.  Use saline nasal sprays to help moisten and clean your sinuses.  Take medicines only as directed by your health care provider.  If you were prescribed either an antibiotic or antifungal medicine, finish it all even if you start to feel better. SEEK IMMEDIATE MEDICAL CARE IF:  You have increasing  pain or severe headaches.  You have nausea, vomiting, or drowsiness.  You have swelling around your face.  You have vision problems.  You have a stiff neck.  You have difficulty breathing. MAKE SURE YOU:   Understand these instructions.  Will watch your condition.  Will get help right away if you are not doing well or get worse. Document Released: 06/27/2005 Document Revised: 11/11/2013 Document Reviewed: 07/12/2011 [facility] Patient Information 2015 [place], [place]. This information is not intended to replace advice given to you by your health care provider. Make sure you discuss any questions you have with your health care provider.

## 2014-02-27 NOTE — ED Provider Notes (Addendum)
CSN: [id number]     Arrival date & time 02/27/14  1020 History   First MD Initiated Contact with Patient 02/27/14 1049     Chief Complaint  Patient presents with  . URI   (Consider location/radiation/quality/duration/timing/severity/associated sxs/prior Treatment) HPI  Started 5 days ago w/ coughing and sneezing. Productive cough. Sputum is turning green. Getting worse. Denies fevers, CP, SOB. Now w/ ears popping and sinus pain and pressure. Brother came home from hospital sick w/ URI symptoms. Has not taken anything at home. Nothing makes symptoms worse. Wakes up at night. Nausea.    Past Medical History  Diagnosis Date  . Diabetes mellitus   . Arthritis    Past Surgical History  Procedure Laterality Date  . Knee surgrery     Family History  Problem Relation Age of Onset  . Cancer Mother   . Stroke Father   . Heart disease Father   . Diabetes Father    History  Substance Use Topics  . Smoking status: Current Every Day Smoker -- 1.00 packs/day    Types: Cigarettes  . Smokeless tobacco: Not on file  . Alcohol Use: Yes     Comment: occ    Review of Systems Per HPI with all other pertinent systems negative.   Allergies  Review of patient's allergies indicates no known allergies.  Home Medications   Prior to Admission medications   Medication Sig Start Date End Date Taking? Authorizing Provider  albuterol (PROVENTIL HFA;VENTOLIN HFA) 108 (90 BASE) MCG/ACT inhaler Inhale 2 puffs into the lungs every 6 (six) hours as needed for wheezing. 08/19/13   Reyne Dumas, MD  amoxicillin-clavulanate (AUGMENTIN) 875-125 MG per tablet Take 1 tablet by mouth 2 (two) times daily. 09/13/13   Lorayne Marek, MD  baclofen (LIORESAL) 10 MG tablet Take 1 tablet (10 mg total) by mouth at bedtime. 11/18/13   Lorayne Marek, MD  benzonatate (TESSALON) 100 MG capsule Take 1 capsule (100 mg total) by mouth 3 (three) times daily as needed for cough. 09/13/13   Lorayne Marek, MD  cetirizine (ZYRTEC  ALLERGY) 10 MG tablet Take 1 tablet (10 mg total) by mouth daily. 08/19/13   Reyne Dumas, MD  doxycycline (VIBRA-TABS) 100 MG tablet Take 1 tablet (100 mg total) by mouth 2 (two) times daily. 02/27/14   Waldemar Dickens, MD  gemfibrozil (LOPID) 600 MG tablet Take 1 tablet (600 mg total) by mouth 2 (two) times daily before a meal. 11/18/13   Lorayne Marek, MD  glipiZIDE (GLUCOTROL) 10 MG tablet Take 2 tablets (20 mg total) by mouth 2 (two) times daily before a meal. 11/18/13   Lorayne Marek, MD  hydrocortisone cream 1 % Apply 1 application topically 2 (two) times daily. 09/13/13   Lorayne Marek, MD  metFORMIN (GLUCOPHAGE) 1000 MG tablet Take 1 tablet (1,000 mg total) by mouth 2 (two) times daily with a meal. 09/13/13   Lorayne Marek, MD  nicotine (NICODERM CQ) 21 mg/24hr patch Place 1 patch (21 mg total) onto the skin daily. 09/13/13   Lorayne Marek, MD  predniSONE (DELTASONE) 50 MG tablet Take daily with breakfast 02/27/14   Waldemar Dickens, MD  traMADol (ULTRAM) 50 MG tablet Take 1 tablet (50 mg total) by mouth every 12 (twelve) hours as needed. 11/18/13   Lorayne Marek, MD   BP 145/86  Pulse 88  Temp(Src) 99 F (37.2 C) (Oral)  Resp 20  SpO2 95% Physical Exam  Constitutional: He is oriented to person, place, and time. He appears well-developed  and well-nourished. No distress.  HENT:  Head: Normocephalic and atraumatic.  Frontal and maxillary sinuses ttp. Nasally speech  Eyes: EOM are normal. Pupils are equal, round, and reactive to light.  Neck: Normal range of motion.  Cardiovascular: Normal rate and normal heart sounds.   No murmur heard. Pulmonary/Chest: No respiratory distress. He has wheezes. He has no rales. He exhibits no tenderness.  Abdominal: Soft. Bowel sounds are normal. He exhibits no distension.  Musculoskeletal: Normal range of motion. He exhibits no edema and no tenderness.  Neurological: He is alert and oriented to person, place, and time. No cranial nerve deficit.  Skin: Skin is  warm. No rash noted. He is not diaphoretic.  Psychiatric: His behavior is normal. Thought content normal.    ED Course  Procedures (including critical care time) Labs Review Labs Reviewed - No data to display  Imaging Review No results found.    02/27/2014 1125 ipratropium-albuterol (DUONEB) 0.5-2.5 (3) MG/3ML nebulizer solution 3 mL 3 mL Nebulization Given Roselie Awkward, RN   MDM   1. COPD exacerbation   2. Acute maxillary sinusitis, recurrence not specified      No formal Dx of COPD but long time smoker w/ productive cough adn wheezing concerning for COPD exacerbation. Doxy, solumedrol 125 in office w/ prednisone 50mg  PO daily, and albuterol Q4 at home for 24-48 hrs  SInusitis: Doxy as above. Steroids as above will likely provide some antiinflammatory benefit   Precautions given and all questions answered  F/u PCP prn  Linna Darner, MD Family Medicine 02/27/2014, 11:23 AM      Waldemar Dickens, MD 02/27/14 [place], MD 06/09/14 [phone]

## 2014-05-05 ENCOUNTER — Telehealth: Payer: Self-pay | Admitting: Emergency Medicine

## 2014-05-05 ENCOUNTER — Telehealth: Payer: Self-pay | Admitting: Internal Medicine

## 2014-05-05 DIAGNOSIS — E781 Pure hyperglyceridemia: Secondary | ICD-10-CM

## 2014-05-05 DIAGNOSIS — Z760 Encounter for issue of repeat prescription: Secondary | ICD-10-CM

## 2014-05-05 DIAGNOSIS — E119 Type 2 diabetes mellitus without complications: Secondary | ICD-10-CM

## 2014-05-05 MED ORDER — GLIPIZIDE 10 MG PO TABS: 20.0000 mg | ORAL_TABLET | Freq: Two times a day (BID) | ORAL | Status: DC

## 2014-05-05 MED ORDER — GEMFIBROZIL 600 MG PO TABS: 600.0000 mg | ORAL_TABLET | Freq: Two times a day (BID) | ORAL | Status: DC

## 2014-05-05 MED ORDER — METFORMIN HCL 1000 MG PO TABS: 1000.0000 mg | ORAL_TABLET | Freq: Two times a day (BID) | ORAL | Status: DC

## 2014-05-05 NOTE — Telephone Encounter (Signed)
Medication reordered 1 mnth supply on Glipizide,Metformin and Gemfibrozil and e-scribed to [facility] pharmacy until seen by provider 05/13/14

## 2014-05-05 NOTE — Telephone Encounter (Signed)
Pt's brother called for pt. To make f/u appt for pt. Pt has an upcoming appt on 05/13/2014 but pt does not have enough medication to last until then. Please f/u with pt.

## 2014-05-13 ENCOUNTER — Encounter: Payer: Self-pay | Admitting: Internal Medicine

## 2014-05-13 ENCOUNTER — Ambulatory Visit: Payer: Medicare Other | Attending: Internal Medicine | Admitting: Internal Medicine

## 2014-05-13 VITALS — BP 137/79 | HR 69 | Temp 97.8°F | Resp 16 | Wt 226.0 lb

## 2014-05-13 DIAGNOSIS — F17210 Nicotine dependence, cigarettes, uncomplicated: Secondary | ICD-10-CM | POA: Insufficient documentation

## 2014-05-13 DIAGNOSIS — E781 Pure hyperglyceridemia: Secondary | ICD-10-CM | POA: Diagnosis not present

## 2014-05-13 DIAGNOSIS — E1165 Type 2 diabetes mellitus with hyperglycemia: Secondary | ICD-10-CM | POA: Diagnosis not present

## 2014-05-13 DIAGNOSIS — G894 Chronic pain syndrome: Secondary | ICD-10-CM

## 2014-05-13 DIAGNOSIS — M25512 Pain in left shoulder: Secondary | ICD-10-CM | POA: Diagnosis not present

## 2014-05-13 DIAGNOSIS — H538 Other visual disturbances: Secondary | ICD-10-CM | POA: Diagnosis not present

## 2014-05-13 DIAGNOSIS — Z7952 Long term (current) use of systemic steroids: Secondary | ICD-10-CM | POA: Insufficient documentation

## 2014-05-13 DIAGNOSIS — Z760 Encounter for issue of repeat prescription: Secondary | ICD-10-CM | POA: Diagnosis not present

## 2014-05-13 DIAGNOSIS — E139 Other specified diabetes mellitus without complications: Secondary | ICD-10-CM

## 2014-05-13 LAB — GLUCOSE, POCT (MANUAL RESULT ENTRY): POC GLUCOSE: 165 mg/dL — AB (ref 70–99)

## 2014-05-13 LAB — POCT GLYCOSYLATED HEMOGLOBIN (HGB A1C): HEMOGLOBIN A1C: 9.2

## 2014-05-13 MED ORDER — TRAMADOL HCL 50 MG PO TABS: 50.0000 mg | ORAL_TABLET | Freq: Two times a day (BID) | ORAL | Status: DC | PRN

## 2014-05-13 MED ORDER — SITAGLIPTIN PHOS-METFORMIN HCL 50-1000 MG PO TABS: 1.0000 | ORAL_TABLET | Freq: Two times a day (BID) | ORAL | Status: DC

## 2014-05-13 MED ORDER — GEMFIBROZIL 600 MG PO TABS: 600.0000 mg | ORAL_TABLET | Freq: Two times a day (BID) | ORAL | Status: DC

## 2014-05-13 MED ORDER — GLIPIZIDE 10 MG PO TABS: 20.0000 mg | ORAL_TABLET | Freq: Two times a day (BID) | ORAL | Status: DC

## 2014-05-13 NOTE — Progress Notes (Signed)
MRN: [id number] Name: Ryan Farmer  Sex: male Age: 58 y.o. DOB: 05/11/1956  Allergies: Review of patient's allergies indicates no known allergies.  Chief Complaint  Patient presents with  . Follow-up    HPI: Patient is 58 y.o. male who has to of diabetes hypertension hypertriglyceridemia comes today for followup, on the last visit her goal dose was increased, noticed improvement in the A1c but is still diabetes is uncontrolled, patient denies any acute symptoms has history of chronic pain syndrome and is requesting refill on tramadol.patient does complain of blurry vision on and off and is requesting referral to see a ophthalmologist  Past Medical History  Diagnosis Date  . Diabetes mellitus   . Arthritis     Past Surgical History  Procedure Laterality Date  . Knee surgrery        Medication List       This list is accurate as of: 05/13/14  6:29 PM.  Always use your most recent med list.               albuterol 108 (90 BASE) MCG/ACT inhaler  Commonly known as:  PROVENTIL HFA;VENTOLIN HFA  Inhale 2 puffs into the lungs every 6 (six) hours as needed for wheezing.     amoxicillin-clavulanate 875-125 MG per tablet  Commonly known as:  AUGMENTIN  Take 1 tablet by mouth 2 (two) times daily.     baclofen 10 MG tablet  Commonly known as:  LIORESAL  Take 1 tablet (10 mg total) by mouth at bedtime.     benzonatate 100 MG capsule  Commonly known as:  TESSALON  Take 1 capsule (100 mg total) by mouth 3 (three) times daily as needed for cough.     cetirizine 10 MG tablet  Commonly known as:  ZYRTEC ALLERGY  Take 1 tablet (10 mg total) by mouth daily.     doxycycline 100 MG tablet  Commonly known as:  VIBRA-TABS  Take 1 tablet (100 mg total) by mouth 2 (two) times daily.     gemfibrozil 600 MG tablet  Commonly known as:  LOPID  Take 1 tablet (600 mg total) by mouth 2 (two) times daily before a meal.     glipiZIDE 10 MG tablet  Commonly known as:  GLUCOTROL  Take  2 tablets (20 mg total) by mouth 2 (two) times daily before a meal.     hydrocortisone cream 1 %  Apply 1 application topically 2 (two) times daily.     metFORMIN 1000 MG tablet  Commonly known as:  GLUCOPHAGE  Take 1 tablet (1,000 mg total) by mouth 2 (two) times daily with a meal.     nicotine 21 mg/24hr patch  Commonly known as:  NICODERM CQ  Place 1 patch (21 mg total) onto the skin daily.     predniSONE 50 MG tablet  Commonly known as:  DELTASONE  Take daily with breakfast     sitaGLIPtin-metformin 50-1000 MG per tablet  Commonly known as:  JANUMET  Take 1 tablet by mouth 2 (two) times daily with a meal.     traMADol 50 MG tablet  Commonly known as:  ULTRAM  Take 1 tablet (50 mg total) by mouth every 12 (twelve) hours as needed.        Meds ordered this encounter  Medications  . traMADol (ULTRAM) 50 MG tablet    Sig: Take 1 tablet (50 mg total) by mouth every 12 (twelve) hours as needed.    Dispense:  60 tablet    Refill:  0  . gemfibrozil (LOPID) 600 MG tablet    Sig: Take 1 tablet (600 mg total) by mouth 2 (two) times daily before a meal.    Dispense:  60 tablet    Refill:  3  . glipiZIDE (GLUCOTROL) 10 MG tablet    Sig: Take 2 tablets (20 mg total) by mouth 2 (two) times daily before a meal.    Dispense:  120 tablet    Refill:  2  . sitaGLIPtin-metformin (JANUMET) 50-1000 MG per tablet    Sig: Take 1 tablet by mouth 2 (two) times daily with a meal.    Dispense:  30 tablet    Refill:  3     There is no immunization history on file for this patient.  Family History  Problem Relation Age of Onset  . Cancer Mother   . Stroke Father   . Heart disease Father   . Diabetes Father     History  Substance Use Topics  . Smoking status: Current Every Day Smoker -- 1.00 packs/day    Types: Cigarettes  . Smokeless tobacco: Not on file  . Alcohol Use: Yes     Comment: occ    Review of Systems   As noted in HPI  Filed Vitals:   05/13/14 1717  BP:  137/79  Pulse: 69  Temp: 97.8 F (36.6 C)  Resp: 16    Physical Exam  Physical Exam  Constitutional: No distress.  Eyes: EOM are normal. Pupils are equal, round, and reactive to light.  Cardiovascular: Normal rate and regular rhythm.   Pulmonary/Chest: Breath sounds normal. No respiratory distress. He has no wheezes. He has no rales.  Musculoskeletal: He exhibits no edema.    CBC    Component Value Date/Time   WBC 8.2 09/01/2013 0754   RBC 5.86* 09/01/2013 0754   HGB 17.2* 09/01/2013 0754   HCT 50.4 09/01/2013 0754   PLT 200 09/01/2013 0754   MCV 86.0 09/01/2013 0754   LYMPHSABS 2.2 08/19/2013 1726   MONOABS 0.7 08/19/2013 1726   EOSABS 0.2 08/19/2013 1726   BASOSABS 0.0 08/19/2013 1726    CMP     Component Value Date/Time   NA 129* 09/01/2013 0754   K 4.7 09/01/2013 0754   CL 90* 09/01/2013 0754   CO2 24 09/01/2013 0754   GLUCOSE 505* 09/01/2013 0754   BUN 26* 09/01/2013 0754   CREATININE 0.74 09/01/2013 0754   CREATININE 0.97 08/19/2013 1726   CALCIUM 10.4 09/01/2013 0754   PROT 7.3 09/01/2013 0754   ALBUMIN 3.9 09/01/2013 0754   AST 22 09/01/2013 0754   ALT 42 09/01/2013 0754   ALKPHOS 156* 09/01/2013 0754   BILITOT <0.2* 09/01/2013 0754   GFRNONAA >90 09/01/2013 0754   GFRNONAA 87 08/19/2013 1726   GFRAA >90 09/01/2013 0754   GFRAA >89 08/19/2013 1726    Lab Results  Component Value Date/Time   CHOL 159 08/19/2013 05:26 PM    No components found for: HGA1C  Lab Results  Component Value Date/Time   AST 22 09/01/2013 07:54 AM    Assessment and Plan  Other specified diabetes mellitus without complications - Plan: Results for orders placed or performed in visit on 05/13/14  Glucose (CBG)  Result Value Ref Range   POC Glucose 165 (A) 70 - 99 mg/dl  HgB A1c  Result Value Ref Range   Hemoglobin A1C 9.2    Diabetes is still uncontrolled, patient is advised for diabetes  meal planning, he will continue with the current dose of Glucotrol, I have  discontinued metformin and started him on Janumet, glipiZIDE (GLUCOTROL) 10 MG tablet, sitaGLIPtin-metformin (JANUMET) 50-1000 MG per tablet, Ambulatory referral to Ophthalmology  Hypertriglyceridemia - Plan: continue with gemfibrozil (LOPID) 600 MG tablet will repeat fasting lipid panel on the next visit   Chronic pain syndrome - Plan: traMADol (ULTRAM) 50 MG tablet  Blurry vision - Plan: Ambulatory referral to Ophthalmology   Health Maintenance Patient declines flu shot   Return in about 3 months (around 08/13/2014) for hypertension, diabetes.  Lorayne Marek, MD

## 2014-05-13 NOTE — Patient Instructions (Signed)
Diabetes Mellitus and Food It is important for you to manage your blood sugar (glucose) level. Your blood glucose level can be greatly affected by what you eat. Eating healthier foods in the appropriate amounts throughout the day at about the same time each day will help you control your blood glucose level. It can also help slow or prevent worsening of your diabetes mellitus. Healthy eating may even help you improve the level of your blood pressure and reach or maintain a healthy weight.  HOW CAN FOOD AFFECT ME? Carbohydrates Carbohydrates affect your blood glucose level more than any other type of food. Your dietitian will help you determine how many carbohydrates to eat at each meal and teach you how to count carbohydrates. Counting carbohydrates is important to keep your blood glucose at a healthy level, especially if you are using insulin or taking certain medicines for diabetes mellitus. Alcohol Alcohol can cause sudden decreases in blood glucose (hypoglycemia), especially if you use insulin or take certain medicines for diabetes mellitus. Hypoglycemia can be a life-threatening condition. Symptoms of hypoglycemia (sleepiness, dizziness, and disorientation) are similar to symptoms of having too much alcohol.  If your health care provider has given you approval to drink alcohol, do so in moderation and use the following guidelines:  Women should not have more than one drink per day, and men should not have more than two drinks per day. One drink is equal to:  12 oz of beer.  5 oz of wine.  1 oz of hard liquor.  Do not drink on an empty stomach.  Keep yourself hydrated. Have water, diet soda, or unsweetened iced tea.  Regular soda, juice, and other mixers might contain a lot of carbohydrates and should be counted. WHAT FOODS ARE NOT RECOMMENDED? As you make food choices, it is important to remember that all foods are not the same. Some foods have fewer nutrients per serving than other  foods, even though they might have the same number of calories or carbohydrates. It is difficult to get your body what it needs when you eat foods with fewer nutrients. Examples of foods that you should avoid that are high in calories and carbohydrates but low in nutrients include:  Trans fats (most processed foods list trans fats on the Nutrition Facts label).  Regular soda.  Juice.  Candy.  Sweets, such as cake, pie, doughnuts, and cookies.  Fried foods. WHAT FOODS CAN I EAT? Have nutrient-rich foods, which will nourish your body and keep you healthy. The food you should eat also will depend on several factors, including:  The calories you need.  The medicines you take.  Your weight.  Your blood glucose level.  Your blood pressure level.  Your cholesterol level. You also should eat a variety of foods, including:  Protein, such as meat, poultry, fish, tofu, nuts, and seeds (lean animal proteins are best).  Fruits.  Vegetables.  Dairy products, such as milk, cheese, and yogurt (low fat is best).  Breads, grains, pasta, cereal, rice, and beans.  Fats such as olive oil, trans fat-free margarine, canola oil, avocado, and olives. DOES EVERYONE WITH DIABETES MELLITUS HAVE THE SAME MEAL PLAN? Because every person with diabetes mellitus is different, there is not one meal plan that works for everyone. It is very important that you meet with a dietitian who will help you create a meal plan that is just right for you. Document Released: 03/24/2005 Document Revised: 07/02/2013 Document Reviewed: 05/24/2013 Exit[employer] Patient Information 2015 [employer]. This   information is not intended to replace advice given to you by your health care provider. Make sure you discuss any questions you have with your health care provider.  

## 2014-05-13 NOTE — Progress Notes (Signed)
Patient here for follow up on his diabetes Complains of back pain and states has not had any tramadol Requesting refills on his medications

## 2014-05-20 DIAGNOSIS — E119 Type 2 diabetes mellitus without complications: Secondary | ICD-10-CM | POA: Diagnosis not present

## 2014-06-19 ENCOUNTER — Telehealth: Payer: Self-pay | Admitting: Internal Medicine

## 2014-06-19 NOTE — Telephone Encounter (Signed)
Pt's brother called to request a medication refill for Metformin, Glipizide, Gemfibrozil, and Janumet. Pt's brother states that patient does not have any more medication and he is being non compliant about eating right. Pt. Would like medications sent to [facility] on [address]. Please f/u with pt's brother if necessary.

## 2014-06-20 ENCOUNTER — Telehealth: Payer: Self-pay | Admitting: Emergency Medicine

## 2014-06-20 NOTE — Telephone Encounter (Signed)
Left message for pt to call regarding medication refills

## 2014-06-20 NOTE — Telephone Encounter (Signed)
Expand All Collapse All   Pt's brother called to request a medication refill for Metformin, Glipizide, Gemfibrozil, and Janumet. Pt's brother states that patient does not have any more medication and he is being non compliant about eating right. Pt. Would like medications sent to [facility] on [address]. Please f/u with pt's brother if necessary.       FYI Left message that pt has refills on file

## 2014-07-25 ENCOUNTER — Telehealth: Payer: Self-pay | Admitting: Internal Medicine

## 2014-07-25 NOTE — Telephone Encounter (Signed)
[facility] [employer] has faxed over request for order for Decompression back belt.  Please disregard request that has pt's name spelled as 'Ryan Farmer' they sent another with proper spelling of name.

## 2014-08-31 ENCOUNTER — Other Ambulatory Visit: Payer: Self-pay | Admitting: Internal Medicine

## 2014-09-18 ENCOUNTER — Ambulatory Visit: Payer: Medicare Other | Attending: Internal Medicine | Admitting: Internal Medicine

## 2014-09-18 ENCOUNTER — Encounter: Payer: Self-pay | Admitting: Internal Medicine

## 2014-09-18 VITALS — BP 139/85 | HR 100 | Temp 97.7°F | Resp 18 | Ht 72.0 in | Wt 215.0 lb

## 2014-09-18 DIAGNOSIS — E119 Type 2 diabetes mellitus without complications: Secondary | ICD-10-CM | POA: Insufficient documentation

## 2014-09-18 DIAGNOSIS — E1165 Type 2 diabetes mellitus with hyperglycemia: Secondary | ICD-10-CM

## 2014-09-18 DIAGNOSIS — G894 Chronic pain syndrome: Secondary | ICD-10-CM | POA: Diagnosis not present

## 2014-09-18 DIAGNOSIS — E781 Pure hyperglyceridemia: Secondary | ICD-10-CM | POA: Diagnosis not present

## 2014-09-18 DIAGNOSIS — IMO0002 Reserved for concepts with insufficient information to code with codable children: Secondary | ICD-10-CM

## 2014-09-18 LAB — POCT GLYCOSYLATED HEMOGLOBIN (HGB A1C): HEMOGLOBIN A1C: 7.4

## 2014-09-18 LAB — GLUCOSE, POCT (MANUAL RESULT ENTRY): POC Glucose: 255 mg/dl — AB (ref 70–99)

## 2014-09-18 MED ORDER — METFORMIN HCL 1000 MG PO TABS: 1000.0000 mg | ORAL_TABLET | Freq: Two times a day (BID) | ORAL | Status: DC

## 2014-09-18 MED ORDER — GEMFIBROZIL 600 MG PO TABS: 600.0000 mg | ORAL_TABLET | Freq: Two times a day (BID) | ORAL | Status: DC

## 2014-09-18 MED ORDER — TRAMADOL HCL 50 MG PO TABS: 50.0000 mg | ORAL_TABLET | Freq: Two times a day (BID) | ORAL | Status: DC | PRN

## 2014-09-18 MED ORDER — AZITHROMYCIN 250 MG PO TABS: ORAL_TABLET | ORAL | Status: DC

## 2014-09-18 NOTE — Patient Instructions (Signed)
DASH Eating Plan °DASH stands for "Dietary Approaches to Stop Hypertension." The DASH eating plan is a healthy eating plan that has been shown to reduce high blood pressure (hypertension). Additional health benefits may include reducing the risk of type 2 diabetes mellitus, heart disease, and stroke. The DASH eating plan may also help with weight loss. °WHAT DO I NEED TO KNOW ABOUT THE DASH EATING PLAN? °For the DASH eating plan, you will follow these general guidelines: °· Choose foods with a percent daily value for sodium of less than 5% (as listed on the food label). °· Use salt-free seasonings or herbs instead of table salt or sea salt. °· Check with your health care provider or pharmacist before using salt substitutes. °· Eat lower-sodium products, often labeled as "lower sodium" or "no salt added." °· Eat fresh foods. °· Eat more vegetables, fruits, and low-fat dairy products. °· Choose whole grains. Look for the word "whole" as the first word in the ingredient list. °· Choose fish and skinless chicken or turkey more often than red meat. Limit fish, poultry, and meat to 6 oz (170 g) each day. °· Limit sweets, desserts, sugars, and sugary drinks. °· Choose heart-healthy fats. °· Limit cheese to 1 oz (28 g) per day. °· Eat more home-cooked food and less restaurant, buffet, and fast food. °· Limit fried foods. °· Cook foods using methods other than frying. °· Limit canned vegetables. If you do use them, rinse them well to decrease the sodium. °· When eating at a restaurant, ask that your food be prepared with less salt, or no salt if possible. °WHAT FOODS CAN I EAT? °Seek help from a dietitian for individual calorie needs. °Grains °Whole grain or whole wheat bread. Brown rice. Whole grain or whole wheat pasta. Quinoa, bulgur, and whole grain cereals. Low-sodium cereals. Corn or whole wheat flour tortillas. Whole grain cornbread. Whole grain crackers. Low-sodium crackers. °Vegetables °Fresh or frozen vegetables  (raw, steamed, roasted, or grilled). Low-sodium or reduced-sodium tomato and vegetable juices. Low-sodium or reduced-sodium tomato sauce and paste. Low-sodium or reduced-sodium canned vegetables.  °Fruits °All fresh, canned (in natural juice), or frozen fruits. °Meat and Other Protein Products °Ground beef (85% or leaner), grass-fed beef, or beef trimmed of fat. Skinless chicken or turkey. Ground chicken or turkey. Pork trimmed of fat. All fish and seafood. Eggs. Dried beans, peas, or lentils. Unsalted nuts and seeds. Unsalted canned beans. °Dairy °Low-fat dairy products, such as skim or 1% milk, 2% or reduced-fat cheeses, low-fat ricotta or cottage cheese, or plain low-fat yogurt. Low-sodium or reduced-sodium cheeses. °Fats and Oils °Tub margarines without trans fats. Light or reduced-fat mayonnaise and salad dressings (reduced sodium). Avocado. Safflower, olive, or canola oils. Natural peanut or almond butter. °Other °Unsalted popcorn and pretzels. °The items listed above may not be a complete list of recommended foods or beverages. Contact your dietitian for more options. °WHAT FOODS ARE NOT RECOMMENDED? °Grains °White bread. White pasta. White rice. Refined cornbread. Bagels and croissants. Crackers that contain trans fat. °Vegetables °Creamed or fried vegetables. Vegetables in a cheese sauce. Regular canned vegetables. Regular canned tomato sauce and paste. Regular tomato and vegetable juices. °Fruits °Dried fruits. Canned fruit in light or heavy syrup. Fruit juice. °Meat and Other Protein Products °Fatty cuts of meat. Ribs, chicken wings, bacon, sausage, bologna, salami, chitterlings, fatback, hot dogs, bratwurst, and packaged luncheon meats. Salted nuts and seeds. Canned beans with salt. °Dairy °Whole or 2% milk, cream, half-and-half, and cream cheese. Whole-fat or sweetened yogurt. Full-fat   cheeses or blue cheese. Nondairy creamers and whipped toppings. Processed cheese, cheese spreads, or cheese  curds. °Condiments °Onion and garlic salt, seasoned salt, table salt, and sea salt. Canned and packaged gravies. Worcestershire sauce. Tartar sauce. Barbecue sauce. Teriyaki sauce. Soy sauce, including reduced sodium. Steak sauce. Fish sauce. Oyster sauce. Cocktail sauce. Horseradish. Ketchup and mustard. Meat flavorings and tenderizers. Bouillon cubes. Hot sauce. Tabasco sauce. Marinades. Taco seasonings. Relishes. °Fats and Oils °Butter, stick margarine, lard, shortening, ghee, and bacon fat. Coconut, palm kernel, or palm oils. Regular salad dressings. °Other °Pickles and olives. Salted popcorn and pretzels. °The items listed above may not be a complete list of foods and beverages to avoid. Contact your dietitian for more information. °WHERE CAN I FIND MORE INFORMATION? °National [employer], [employer], [employer]: www.nhlbi.nih.gov/health/health-topics/topics/dash/ °Document Released: 06/16/2011 Document Revised: 11/11/2013 Document Reviewed: 05/01/2013 °ExitCare® Patient Information ©2015 [employer]. This information is not intended to replace advice given to you by your health care provider. Make sure you discuss any questions you have with your health care provider. ° °

## 2014-09-18 NOTE — Progress Notes (Signed)
Patient ID: Ryan Farmer, male   DOB: 03-Aug-1955, 59 y.o.   MRN: [id number] SUBJECTIVE: 59 y.o. male for follow up of diabetes. Diabetic Review of Systems - medication compliance: compliant most of the time, diabetic diet compliance: compliant most of the time, home glucose monitoring: is performed sporadically, further diabetic ROS: no polyuria or polydipsia, no chest pain, dyspnea or TIA's, no numbness, tingling or pain in extremities, no unusual visual symptoms.  Other symptoms and concerns: wants refill of Tramadol for chronic pain of lower back and arthritis.  Current Outpatient Prescriptions  Medication Sig Dispense Refill  . baclofen (LIORESAL) 10 MG tablet Take 1 tablet (10 mg total) by mouth at bedtime. 30 each 2  . cetirizine (ZYRTEC ALLERGY) 10 MG tablet Take 1 tablet (10 mg total) by mouth daily. 30 tablet 3  . gemfibrozil (LOPID) 600 MG tablet Take 1 tablet (600 mg total) by mouth 2 (two) times daily before a meal. 60 tablet 3  . glipiZIDE (GLUCOTROL) 10 MG tablet Take 2 tablets (20 mg total) by mouth 2 (two) times daily before a meal. 120 tablet 2  . hydrocortisone cream 1 % Apply 1 application topically 2 (two) times daily. 30 g 1  . metFORMIN (GLUCOPHAGE) 1000 MG tablet TAKE 1 TABLET BY MOUTH TWICE DAILY WITH MEALS 60 tablet 0  . sitaGLIPtin-metformin (JANUMET) 50-1000 MG per tablet Take 1 tablet by mouth 2 (two) times daily with a meal. 30 tablet 3  . traMADol (ULTRAM) 50 MG tablet Take 1 tablet (50 mg total) by mouth every 12 (twelve) hours as needed. 60 tablet 0  . albuterol (PROVENTIL HFA;VENTOLIN HFA) 108 (90 BASE) MCG/ACT inhaler Inhale 2 puffs into the lungs every 6 (six) hours as needed for wheezing. (Patient not taking: Reported on 09/18/2014) 1 Inhaler 3  . amoxicillin-clavulanate (AUGMENTIN) 875-125 MG per tablet Take 1 tablet by mouth 2 (two) times daily. (Patient not taking: Reported on 09/18/2014) 20 tablet 0  . benzonatate (TESSALON) 100 MG capsule Take 1 capsule (100 mg  total) by mouth 3 (three) times daily as needed for cough. (Patient not taking: Reported on 09/18/2014) 30 capsule 1  . doxycycline (VIBRA-TABS) 100 MG tablet Take 1 tablet (100 mg total) by mouth 2 (two) times daily. (Patient not taking: Reported on 09/18/2014) 20 tablet 0  . nicotine (NICODERM CQ) 21 mg/24hr patch Place 1 patch (21 mg total) onto the skin daily. (Patient not taking: Reported on 09/18/2014) 28 patch 0  . predniSONE (DELTASONE) 50 MG tablet Take daily with breakfast (Patient not taking: Reported on 09/18/2014) 5 tablet 0   No current facility-administered medications for this visit.    OBJECTIVE: Appearance: alert, well appearing, and in no distress and oriented to person, place, and time. BP 139/85 mmHg  Pulse 100  Temp(Src) 97.7 F (36.5 C) (Oral)  Resp 18  Ht 6' (1.829 m)  Wt 215 lb (97.523 kg)  BMI 29.15 kg/m2  SpO2 96%  Exam: heart sounds normal rate, regular rhythm, normal S1, S2, no murmurs, rubs, clicks or gallops, normal bilateral carotid upstroke without bruits, no JVD, chest clear, no carotid bruits  ASSESSMENT: Diabetes Mellitus: well controlled  PLAN: See orders for this visit as documented in the electronic medical record. Issues reviewed with him: diabetic diet discussed in detail, written exchange diet given, home glucose monitoring emphasized, all medications, side effects and compliance discussed carefully, long term diabetic complications discussed and patient urged in the strongest terms to quit smoking.  Return in about 3 months (  around 12/19/2014) for Diabetes Mellitus. with original PCP (Advani).  Chari Manning, NP 09/18/2014 11:19 PM

## 2014-09-18 NOTE — Progress Notes (Signed)
F/U Diabetic  Refill Tramadol

## 2014-09-19 ENCOUNTER — Ambulatory Visit: Payer: Medicare Other | Admitting: Internal Medicine

## 2014-09-19 LAB — MICROALBUMIN, URINE: MICROALB UR: 14.9 mg/dL — AB (ref <2.0)

## 2014-09-22 ENCOUNTER — Telehealth: Payer: Self-pay

## 2014-09-22 ENCOUNTER — Telehealth: Payer: Self-pay | Admitting: General Practice

## 2014-09-22 DIAGNOSIS — E139 Other specified diabetes mellitus without complications: Secondary | ICD-10-CM

## 2014-09-22 MED ORDER — GLUCOCOM LANCETS 28G MISC: Status: DC

## 2014-09-22 MED ORDER — GLUCOSE BLOOD VI STRP
ORAL_STRIP | Status: DC
Start: 2014-09-22 — End: 2015-09-17

## 2014-09-22 MED ORDER — GLIPIZIDE 10 MG PO TABS: 20.0000 mg | ORAL_TABLET | Freq: Two times a day (BID) | ORAL | Status: DC

## 2014-09-22 NOTE — Telephone Encounter (Signed)
Patient presents to clinic today requesting to speak to a nurse in regards to medications. Patient was just in for OV on 09/18/14 seen by NP, Mateo Flow because his PCP did not have any appointments available. Patient states he was not given a refill on all of his medications and he is out of his diabetes meds/strips and a few other meds (names he did not provide) Patient requesting to speak to his nurse, as she is more familiar with his conditions and the medications that he take. Please follow up with patient in regards to his his concerns.

## 2014-09-22 NOTE — Telephone Encounter (Signed)
Patient called requesting refills on his medications Medications were already sent 09/18/14 Patient is aware

## 2014-09-24 ENCOUNTER — Telehealth: Payer: Self-pay

## 2014-09-24 MED ORDER — LISINOPRIL 5 MG PO TABS: 5.0000 mg | ORAL_TABLET | Freq: Every day | ORAL | Status: DC

## 2014-09-24 NOTE — Telephone Encounter (Signed)
-----   Message from Lorayne Marek, MD sent at 09/23/2014 12:23 PM EDT ----- Call and let the patient know that patient has protein in the urine which is  likely secondary to diabetes, also his blood pressure recently was borderline elevated, recommend ACE inhibitor which has kidney protection benefits, advised patient to start taking lisinopril 5 mg daily also come back in 2 weeks for nurse visit BP check.

## 2014-09-24 NOTE — Telephone Encounter (Signed)
Patient not available Left message on  Machine to return our call Prescription sent to pharmacy on file

## 2014-09-29 ENCOUNTER — Encounter (HOSPITAL_COMMUNITY): Payer: Self-pay | Admitting: Emergency Medicine

## 2014-09-29 ENCOUNTER — Emergency Department (HOSPITAL_COMMUNITY)
Admission: EM | Admit: 2014-09-29 | Discharge: 2014-09-29 | Disposition: A | Discharge status: Home / Self Care | Payer: Medicare Other | Attending: Emergency Medicine | Admitting: Emergency Medicine

## 2014-09-29 DIAGNOSIS — Z79899 Other long term (current) drug therapy: Secondary | ICD-10-CM | POA: Diagnosis not present

## 2014-09-29 DIAGNOSIS — M545 Low back pain, unspecified: Secondary | ICD-10-CM

## 2014-09-29 DIAGNOSIS — M25511 Pain in right shoulder: Secondary | ICD-10-CM | POA: Insufficient documentation

## 2014-09-29 DIAGNOSIS — E119 Type 2 diabetes mellitus without complications: Secondary | ICD-10-CM | POA: Diagnosis not present

## 2014-09-29 DIAGNOSIS — Z720 Tobacco use: Secondary | ICD-10-CM | POA: Insufficient documentation

## 2014-09-29 MED ORDER — IBUPROFEN 400 MG PO TABS
600.0000 mg | ORAL_TABLET | Freq: Once | ORAL | Status: AC
  Administered 2014-09-29: 600 mg via ORAL
  Filled 2014-09-29: qty 3

## 2014-09-29 MED ORDER — DIAZEPAM 5 MG PO TABS: 5.0000 mg | ORAL_TABLET | Freq: Two times a day (BID) | ORAL | Status: DC

## 2014-09-29 NOTE — ED Notes (Signed)
Pt sts right shoulder pain and lower back pain worse with positioning and movement

## 2014-09-29 NOTE — ED Notes (Signed)
Declined W/C at D/C and was escorted to lobby by RN. 

## 2014-09-29 NOTE — Discharge Instructions (Signed)
Return to the emergency room with worsening of symptoms, new symptoms or with symptoms that are concerning , especially fevers, loss of control of bladder or bowels, numbness or tingling around genital region or anus, weakness. RICE: Rest, Ice (three cycles of 20 mins on, 6mins off at least twice a day), compression/brace, elevation. Heating pad works well for back pain. Ibuprofen $RemoveBeforeD'400mg'[id number]$  (2 tablets $RemoveBe'200mg'jHsKccEGc$ ) every 5-6 hours for 3-5 days. Valium for muscle spasms. No drinking, driving, operating machinery or other sedating meds while taking Valium. Call to make appointment with orthopedist for your persistent back pain. Read below information and follow recommendations.  Back Injury Prevention Back injuries can be extremely painful and difficult to heal. After having one back injury, you are much more likely to experience another later on. It is important to learn how to avoid injuring or re-injuring your back. The following tips can help you to prevent a back injury. PHYSICAL FITNESS  Exercise regularly and try to develop good tone in your abdominal muscles. Your abdominal muscles provide a lot of the support needed by your back.  Do aerobic exercises (walking, jogging, biking, swimming) regularly.  Do exercises that increase balance and strength (tai chi, yoga) regularly. This can decrease your risk of falling and injuring your back.  Stretch before and after exercising.  Maintain a healthy weight. The more you weigh, the more stress is placed on your back. For every pound of weight, 10 times that amount of pressure is placed on the back. DIET  Talk to your caregiver about how much calcium and vitamin D you need per day. These nutrients help to prevent weakening of the bones (osteoporosis). Osteoporosis can cause broken (fractured) bones that lead to back pain.  Include good sources of calcium in your diet, such as dairy products, green, leafy vegetables, and products with calcium added  (fortified).  Include good sources of vitamin D in your diet, such as milk and foods that are fortified with vitamin D.  Consider taking a nutritional supplement or a multivitamin if needed.  Stop smoking if you smoke. POSTURE  Sit and stand up straight. Avoid leaning forward when you sit or hunching over when you stand.  Choose chairs with good low back (lumbar) support.  If you work at a desk, sit close to your work so you do not need to lean over. Keep your chin tucked in. Keep your neck drawn back and elbows bent at a right angle. Your arms should look like the letter "L."  Sit high and close to the steering wheel when you drive. Add a lumbar support to your car seat if needed.  Avoid sitting or standing in one position for too long. Take breaks to get up, stretch, and walk around at least once every hour. Take breaks if you are driving for long periods of time.  Sleep on your side with your knees slightly bent, or sleep on your back with a pillow under your knees. Do not sleep on your stomach. LIFTING, TWISTING, AND REACHING  Avoid heavy lifting, especially repetitive lifting. If you must do heavy lifting:  Stretch before lifting.  Work slowly.  Rest between lifts.  Use carts and dollies to move objects when possible.  Make several small trips instead of carrying 1 heavy load.  Ask for help when you need it.  Ask for help when moving big, awkward objects.  Follow these steps when lifting:  Stand with your feet shoulder-width apart.  Get as close to the object  as you can. Do not try to pick up heavy objects that are far from your body.  Use handles or lifting straps if they are available.  Bend at your knees. Squat down, but keep your heels off the floor.  Keep your shoulders pulled back, your chin tucked in, and your back straight.  Lift the object slowly, tightening the muscles in your legs, abdomen, and buttocks. Keep the object as close to the center of your  body as possible.  When you put a load down, use these same guidelines in reverse.  Do not:  Lift the object above your waist.  Twist at the waist while lifting or carrying a load. Move your feet if you need to turn, not your waist.  Bend over without bending at your knees.  Avoid reaching over your head, across a table, or for an object on a high surface. OTHER TIPS  Avoid wet floors and keep sidewalks clear of ice to prevent falls.  Do not sleep on a mattress that is too soft or too hard.  Keep items that are used frequently within easy reach.  Put heavier objects on shelves at waist level and lighter objects on lower or higher shelves.  Find ways to decrease your stress, such as exercise, massage, or relaxation techniques. Stress can build up in your muscles. Tense muscles are more vulnerable to injury.  Seek treatment for depression or anxiety if needed. These conditions can increase your risk of developing back pain. SEEK MEDICAL CARE IF:  You injure your back.  You have questions about diet, exercise, or other ways to prevent back injuries. MAKE SURE YOU:  Understand these instructions.  Will watch your condition.  Will get help right away if you are not doing well or get worse. Document Released: 08/04/2004 Document Revised: 09/19/2011 Document Reviewed: 08/08/2011 [facility] Patient Information 2015 [place], [place]. This information is not intended to replace advice given to you by your health care provider. Make sure you discuss any questions you have with your health care provider.

## 2014-09-29 NOTE — ED Provider Notes (Signed)
CSN: [id number]     Arrival date & time 09/29/14  0830 History  This chart was scribed for non-physician practitioner, Al Corpus, PA-C, working with Evelina Bucy, MD by Ladene Artist, ED Scribe. This patient was seen in room TR07C/TR07C and the patient's care was started at 9:35 AM.   Chief Complaint  Patient presents with  . Back Pain  . Shoulder Pain   The history is provided by the patient. No language interpreter was used.   HPI Comments: Ryan Farmer is a 59 y.o. male, with a h/o DM, arthritis, who presents to the Emergency Department complaining of persistent lower L-sided back pain for the past 6-7 months. Pt describes back pain as a pinching, burning sensation that is exacerbated with bending and turning. He also reports R shoulder pain for the past 6-7 months. Pt denies urinary or bowel incontinence, numbness/tingling in groin or anal, abdominal pain, urinary symptoms, injuries, h/o IV drug use or CA. Pt states that he has spoken to his PCP ADVANI, Vernon Prey, MD for symptoms who started him on a muscle relaxant that pt states does not help. Pt has been treating with ice and heating pad without relief. No medications tried.  Past Medical History  Diagnosis Date  . Diabetes mellitus   . Arthritis    Past Surgical History  Procedure Laterality Date  . Knee surgrery     Family History  Problem Relation Age of Onset  . Cancer Mother   . Stroke Father   . Heart disease Father   . Diabetes Father    History  Substance Use Topics  . Smoking status: Current Every Day Smoker -- 1.00 packs/day    Types: Cigarettes  . Smokeless tobacco: Not on file  . Alcohol Use: Yes     Comment: occ    Review of Systems  Gastrointestinal: Negative for abdominal pain.  Genitourinary: Negative.   Musculoskeletal: Positive for back pain and arthralgias.  Neurological: Negative for numbness.   Allergies  Review of patient's allergies indicates no known allergies.  Home Medications    Prior to Admission medications   Medication Sig Start Date End Date Taking? Authorizing Provider  albuterol (PROVENTIL HFA;VENTOLIN HFA) 108 (90 BASE) MCG/ACT inhaler Inhale 2 puffs into the lungs every 6 (six) hours as needed for wheezing. Patient not taking: Reported on 09/18/2014 08/19/13   Reyne Dumas, MD  azithromycin (ZITHROMAX) 250 MG tablet Take two tablets today and one tablet each day after 09/18/14   Lance Bosch, NP  baclofen (LIORESAL) 10 MG tablet Take 1 tablet (10 mg total) by mouth at bedtime. 11/18/13   Lorayne Marek, MD  cetirizine (ZYRTEC ALLERGY) 10 MG tablet Take 1 tablet (10 mg total) by mouth daily. 08/19/13   Reyne Dumas, MD  diazepam (VALIUM) 5 MG tablet Take 1 tablet (5 mg total) by mouth 2 (two) times daily. 09/29/14   Al Corpus, PA-C  gemfibrozil (LOPID) 600 MG tablet Take 1 tablet (600 mg total) by mouth 2 (two) times daily before a meal. 09/18/14   Lance Bosch, NP  glipiZIDE (GLUCOTROL) 10 MG tablet Take 2 tablets (20 mg total) by mouth 2 (two) times daily before a meal. 09/22/14   Lance Bosch, NP  GlucoCom Lancets MISC Check blood sugar TID and QHS 09/22/14   Lance Bosch, NP  glucose blood (CHOICE DM FORA G20 TEST STRIPS) test strip Use as instructed 09/22/14   Lance Bosch, NP  hydrocortisone cream 1 % Apply 1 application  topically 2 (two) times daily. 09/13/13   Lorayne Marek, MD  lisinopril (PRINIVIL,ZESTRIL) 5 MG tablet Take 1 tablet (5 mg total) by mouth daily. 09/24/14   Lorayne Marek, MD  metFORMIN (GLUCOPHAGE) 1000 MG tablet Take 1 tablet (1,000 mg total) by mouth 2 (two) times daily with a meal. 09/18/14   Lance Bosch, NP  sitaGLIPtin-metformin (JANUMET) 50-1000 MG per tablet Take 1 tablet by mouth 2 (two) times daily with a meal. 05/13/14   Lorayne Marek, MD  traMADol (ULTRAM) 50 MG tablet Take 1 tablet (50 mg total) by mouth every 12 (twelve) hours as needed. 09/18/14   Lance Bosch, NP   BP 118/76 mmHg  Pulse 75  Temp(Src) 98.6 F (37 C)  (Oral)  Resp 16  Ht 6' (1.829 m)  Wt 219 lb 6 oz (99.508 kg)  BMI 29.75 kg/m2  SpO2 97% Physical Exam  Constitutional: He appears well-developed and well-nourished. No distress.  HENT:  Head: Normocephalic and atraumatic.  Eyes: Conjunctivae are normal. Right eye exhibits no discharge. Left eye exhibits no discharge.  Cardiovascular: Normal rate, regular rhythm and normal heart sounds.   Pulses:      Radial pulses are 2+ on the right side, and 2+ on the left side.  Pulmonary/Chest: Effort normal and breath sounds normal. No respiratory distress. He has no wheezes.  Abdominal: Soft. Bowel sounds are normal. He exhibits no distension. There is no tenderness.  Musculoskeletal:  No midline back tenderness, step off or crepitus. Left sided lower back tenderness with isolated tight muscle.Marland Kitchen No CVA tenderness. No clavicular tenderness or step offs. No R shoulder deformity. Full ROM of shoulder.   Neurological: He is alert. He has normal strength. No sensory deficit. Coordination normal.  Equal muscle tone. 5/5 strength in lower and upper extremities. DTR equal and intact. Negative straight leg test. Normal gait.  Skin: Skin is warm and dry. He is not diaphoretic.  Nursing note and vitals reviewed.  ED Course  Procedures (including critical care time) DIAGNOSTIC STUDIES: Oxygen Saturation is 97% on RA, normal by my interpretation.    COORDINATION OF CARE: 9:42 AM-Discussed treatment plan which includes ibuprofen, Valium and follow-up with ortho with pt at bedside and pt agreed to plan.   Labs Review Labs Reviewed - No data to display  Imaging Review No results found.   EKG Interpretation None      MDM   Final diagnoses:  Left-sided low back pain without sciatica   Patient with back pain. No loss of bowel or bladder control. No saddle anesthesia. No fever, night sweats, weight loss, h/o cancer, IVDU. VSS. No neurological deficits and normal neuro exam. Patient ambulatory. No  concern for cauda equina. Ptwith isolated tight muscle without relief with baclofen. RICE protocol and will try Valium. Driving and sedation precautions provided. Right shoulder intact and neurovascularly intact. No skin changes. Discussed Rice protocol as well. She'll given referral to orthopedics. Patient is afebrile, nontoxic, and in no acute distress. Patient is appropriate for outpatient management and is stable for discharge.  Discussed return precautions with patient. Discussed all results and patient verbalizes understanding and agrees with plan.  I personally performed the services described in this documentation, which was scribed in my presence. The recorded information has been reviewed and is accurate.   Al Corpus, PA-C 09/29/14 [place], MD 09/29/14 [phone]

## 2014-09-30 ENCOUNTER — Telehealth: Payer: Self-pay | Admitting: Emergency Medicine

## 2014-09-30 NOTE — Telephone Encounter (Signed)
Form needs signature, and completion for a Gluco-kit, same is in your box.

## 2014-09-30 NOTE — Telephone Encounter (Signed)
Langley Gauss can you check what form needs signature

## 2014-10-06 ENCOUNTER — Telehealth: Payer: Self-pay

## 2014-10-06 NOTE — Telephone Encounter (Signed)
Patient called requesting a refill on his metformin Prescription sent to the pharmacy on file

## 2014-12-03 ENCOUNTER — Other Ambulatory Visit: Payer: Self-pay | Admitting: Internal Medicine

## 2014-12-03 DIAGNOSIS — M545 Low back pain: Secondary | ICD-10-CM | POA: Diagnosis not present

## 2015-01-20 DIAGNOSIS — M545 Low back pain: Secondary | ICD-10-CM | POA: Diagnosis not present

## 2015-01-21 ENCOUNTER — Telehealth: Payer: Self-pay

## 2015-01-21 NOTE — Telephone Encounter (Signed)
Patient returned nurses call, patient verified date of birth. Patient reports not needing glipizide at this time. He explains he has all medications he needs except his pain medication, Tramadol. Patient explains he went to "the back doctor" yesterday. Nurse advised patient to make follow up appointment with provider for DM.  Nurse advised patient to discuss pain medication refill when he comes to follow up appointment. Patient voices understanding and has no further questions at this time. Nurse transferred patient to front office staff to make appointment.

## 2015-01-21 NOTE — Telephone Encounter (Signed)
Nurse called patient, reached voicemail. Left message for patient to call Ryan Farmer with [facility], at [phone]. Patient requested glipizide to be refilled in May. Nurse calling to see if patient has glipizide and to schedule appointment for patient to be seen for follow up DM.

## 2015-01-22 ENCOUNTER — Ambulatory Visit: Payer: Medicare Other | Admitting: Internal Medicine

## 2015-01-29 ENCOUNTER — Encounter: Payer: Self-pay | Admitting: Internal Medicine

## 2015-01-29 ENCOUNTER — Ambulatory Visit: Payer: Medicare Other | Attending: Internal Medicine | Admitting: Internal Medicine

## 2015-01-29 VITALS — BP 121/81 | HR 75 | Temp 97.5°F | Resp 18 | Ht 72.0 in | Wt 220.6 lb

## 2015-01-29 DIAGNOSIS — E781 Pure hyperglyceridemia: Secondary | ICD-10-CM | POA: Diagnosis not present

## 2015-01-29 DIAGNOSIS — Z1211 Encounter for screening for malignant neoplasm of colon: Secondary | ICD-10-CM | POA: Diagnosis not present

## 2015-01-29 DIAGNOSIS — M5441 Lumbago with sciatica, right side: Secondary | ICD-10-CM

## 2015-01-29 DIAGNOSIS — E1165 Type 2 diabetes mellitus with hyperglycemia: Secondary | ICD-10-CM | POA: Diagnosis not present

## 2015-01-29 DIAGNOSIS — G894 Chronic pain syndrome: Secondary | ICD-10-CM | POA: Diagnosis not present

## 2015-01-29 DIAGNOSIS — Z79899 Other long term (current) drug therapy: Secondary | ICD-10-CM | POA: Insufficient documentation

## 2015-01-29 DIAGNOSIS — IMO0002 Reserved for concepts with insufficient information to code with codable children: Secondary | ICD-10-CM

## 2015-01-29 DIAGNOSIS — M5440 Lumbago with sciatica, unspecified side: Secondary | ICD-10-CM | POA: Insufficient documentation

## 2015-01-29 DIAGNOSIS — M5442 Lumbago with sciatica, left side: Secondary | ICD-10-CM

## 2015-01-29 LAB — POCT GLYCOSYLATED HEMOGLOBIN (HGB A1C): HEMOGLOBIN A1C: 7.8

## 2015-01-29 LAB — GLUCOSE, POCT (MANUAL RESULT ENTRY): POC Glucose: 162 mg/dl — AB (ref 70–99)

## 2015-01-29 MED ORDER — GLIPIZIDE 10 MG PO TABS: 20.0000 mg | ORAL_TABLET | Freq: Two times a day (BID) | ORAL | Status: DC

## 2015-01-29 MED ORDER — TRAMADOL HCL 50 MG PO TABS
50.0000 mg | ORAL_TABLET | Freq: Two times a day (BID) | ORAL | Status: DC | PRN
Start: 2015-01-29 — End: 2015-08-31

## 2015-01-29 MED ORDER — GEMFIBROZIL 600 MG PO TABS: 600.0000 mg | ORAL_TABLET | Freq: Two times a day (BID) | ORAL | Status: DC

## 2015-01-29 MED ORDER — METFORMIN HCL 1000 MG PO TABS: 1000.0000 mg | ORAL_TABLET | Freq: Two times a day (BID) | ORAL | Status: DC

## 2015-01-29 NOTE — Progress Notes (Signed)
Patient here for follow up for DM2. Patient CBG is 162 and A1C is 7.8. Patient reports pain in his lower back, rated at a 8, described as sore. Pain radiates to legs sometimes. Pain has been present for 6 months now. Pain comes and goes. Patient saw Dr. Rodell Perna in reference to his back pain and was recommended to received therapy. Patient request therapy form be signed by PCP. Patient has taken his morning medications. Patient needs a refill on tramadol. Patient smokes .5 packs of cigarettes/day.

## 2015-01-29 NOTE — Progress Notes (Signed)
Patient ID: QUADIR MUNS, male   DOB: 20-Sep-1955, 59 y.o.   MRN: [id number] SUBJECTIVE: 59 y.o. male for follow up of diabetes. Diabetic Review of Systems - medication compliance: compliant all of the time, diabetic diet compliance: noncompliant some of the time, last eye exam approximately 1 ago.  Some blurred vision. No polyuria/dipsia, or neuropathy. ther symptoms and concerns: Pain in lower back that radiates to his legs at times. He has been seen by Dr. Lorin Mercy for this pain but was told that he needs physical therapy.   Current Outpatient Prescriptions  Medication Sig Dispense Refill  . albuterol (PROVENTIL HFA;VENTOLIN HFA) 108 (90 BASE) MCG/ACT inhaler Inhale 2 puffs into the lungs every 6 (six) hours as needed for wheezing. (Patient not taking: Reported on 09/18/2014) 1 Inhaler 3  . azithromycin (ZITHROMAX) 250 MG tablet Take two tablets today and one tablet each day after 6 tablet 0  . baclofen (LIORESAL) 10 MG tablet Take 1 tablet (10 mg total) by mouth at bedtime. 30 each 2  . cetirizine (ZYRTEC ALLERGY) 10 MG tablet Take 1 tablet (10 mg total) by mouth daily. 30 tablet 3  . diazepam (VALIUM) 5 MG tablet Take 1 tablet (5 mg total) by mouth 2 (two) times daily. 15 tablet 0  . gemfibrozil (LOPID) 600 MG tablet Take 1 tablet (600 mg total) by mouth 2 (two) times daily before a meal. 60 tablet 5  . glipiZIDE (GLUCOTROL) 10 MG tablet Take 2 tablets (20 mg total) by mouth 2 (two) times daily before a meal. 60 tablet 2  . GlucoCom Lancets MISC Check blood sugar TID and QHS 100 each 5  . glucose blood (CHOICE DM FORA G20 TEST STRIPS) test strip Use as instructed 100 each 12  . hydrocortisone cream 1 % Apply 1 application topically 2 (two) times daily. 30 g 1  . lisinopril (PRINIVIL,ZESTRIL) 5 MG tablet Take 1 tablet (5 mg total) by mouth daily. 30 tablet 3  . metFORMIN (GLUCOPHAGE) 1000 MG tablet Take 1 tablet (1,000 mg total) by mouth 2 (two) times daily with a meal. 60 tablet 5  .  sitaGLIPtin-metformin (JANUMET) 50-1000 MG per tablet Take 1 tablet by mouth 2 (two) times daily with a meal. 30 tablet 3  . traMADol (ULTRAM) 50 MG tablet Take 1 tablet (50 mg total) by mouth every 12 (twelve) hours as needed. 60 tablet 0   No current facility-administered medications for this visit.    OBJECTIVE: Appearance: alert, well appearing, and in no distress and oriented to person, place, and time. BP 121/81 mmHg  Pulse 75  Temp(Src) 97.5 F (36.4 C) (Oral)  Resp 18  Ht 6' (1.829 m)  Wt 220 lb 9.6 oz (100.064 kg)  BMI 29.91 kg/m2  SpO2 96%  Exam: heart sounds normal rate, regular rhythm, normal S1, S2, no murmurs, rubs, clicks or gallops, no carotid bruits  Staci was seen today for follow-up.  Diagnoses and all orders for this visit:  Type II diabetes mellitus, uncontrolled Orders: -     Glucose (CBG) -     HgB A1c -    Refill glipiZIDE (GLUCOTROL) 10 MG tablet; Take 2 tablets (20 mg total) by mouth 2 (two) times daily before a meal. -     Refill metFORMIN (GLUCOPHAGE) 1000 MG tablet; Take 1 tablet (1,000 mg total) by mouth 2 (two) times daily with a meal. -     Lipid panel; Future Patients diabetes is well control as evidence by consistently low a1c.  Patient will continue with current therapy and continue to make necessary lifestyle changes.  Reviewed foot care, diet, exercise, annual health maintenance with patient.   Hypertriglyceridemia Orders: -     Refill gemfibrozil (LOPID) 600 MG tablet; Take 1 tablet (600 mg total) by mouth 2 (two) times daily before a meal. Continue lopid, will recheck lipid panel on next visit  Bilateral low back pain with sciatica, sciatica laterality unspecified Orders: -     Ambulatory referral to Physical Therapy Continue with Dr. Lorin Mercy   Chronic pain syndrome Orders: -     Refill traMADol (ULTRAM) 50 MG tablet; Take 1 tablet (50 mg total) by mouth every 12 (twelve) hours as needed.  Colon cancer screening Orders: -      Ambulatory referral to Gastroenterology  Return in about 3 months (around 05/01/2015) for Diabetes Mellitus and 4 weeks Lab-lipid panel.  Lance Bosch, NP 02/01/2015 10:22 PM

## 2015-02-24 ENCOUNTER — Ambulatory Visit: Payer: Medicare Other | Attending: Internal Medicine | Admitting: Physical Therapy

## 2015-02-24 DIAGNOSIS — M5442 Lumbago with sciatica, left side: Secondary | ICD-10-CM | POA: Diagnosis not present

## 2015-02-24 DIAGNOSIS — M2560 Stiffness of unspecified joint, not elsewhere classified: Secondary | ICD-10-CM | POA: Diagnosis not present

## 2015-02-24 NOTE — Patient Instructions (Signed)
Pictures provided for prone press up, standing extension, flexion avoidance and sitting posture correction

## 2015-02-24 NOTE — Therapy (Addendum)
[place], [place], [address] Phone: [phone]   Fax:  [phone]  Physical Therapy Evaluation/Discharge Summary  Patient Details  Name: Ryan Farmer MRN: [id number] Date of Birth: 23-Feb-1956 Referring Provider:  Lorayne Marek, MD  Encounter Date: 02/24/2015      PT End of Session - 02/24/15 1240    Visit Number 1   Number of Visits 16   Date for PT Re-Evaluation 04/21/15   Authorization Type Medicare G code; Visit 10 prog note; KX at visit 15   PT Start Time 1151   PT Stop Time 1235   PT Time Calculation (min) 44 min   Activity Tolerance Patient tolerated treatment well      Past Medical History  Diagnosis Date  . Diabetes mellitus   . Arthritis     Past Surgical History  Procedure Laterality Date  . Knee surgrery      There were no vitals filed for this visit.  Visit Diagnosis:  Bilateral low back pain with left-sided sciatica - Plan: PT plan of care cert/re-cert  Joint stiffness of spine - Plan: PT plan of care cert/re-cert      Subjective Assessment - 02/24/15 1156    Subjective Six months ago he lifted something heavy and had severe back pain.  Worse with bending over and sometimes sitting.    Left thigh pain posterior to knee.  Better with medication.     Pertinent History Diabetic, smoker;  b[employer] 25 years   Limitations Walking   How long can you sit comfortably? 1-2 hours   How long can you stand comfortably? 1 hour   How long can you walk comfortably? 1/4 mile   Diagnostic tests xray "fuzzy at the bottom of my back" ; dr may do MRI   Patient Stated Goals I don't know;  get rid of the pain   Currently in Pain? Yes   Pain Score 8    Pain Location Back   Pain Orientation Left   Pain Type Chronic pain   Pain Onset More than a month ago   Pain Frequency Constant   Aggravating Factors  bending over   Pain Relieving Factors lying supine            [facility] PT  Assessment - 02/24/15 1202    Assessment   Medical Diagnosis LBP with sciatica   Onset Date/Surgical Date --  6 months   Hand Dominance Right   Next MD Visit after PT   Prior Therapy chiro only   Precautions   Precautions None   Restrictions   Weight Bearing Restrictions No   Home Environment   Living Environment Private residence   Type of [place] Access Level entry   [place] One level   Prior Function   Level of [place] On disability  can't remember things, can't read or write   Leisure fish; go to drag strip   Observation/Other Assessments   Focus on Therapeutic Outcomes (FOTO)  patient late/unable to read/write   Posture/Postural Control   Posture/Postural Control --  decreased lumbar lordosis   ROM / Strength   AROM / PROM / Strength AROM;Strength   AROM   AROM Assessment Site Lumbar   Lumbar Flexion 60   Lumbar Extension 10   Lumbar - Right Side Bend 30   Lumbar - Left Side Bend 30   Strength   Strength Assessment Site Lumbar  Lumbar Flexion 4/5   Lumbar Extension 4/5   Special Tests    Special Tests Lumbar   Lumbar Tests Prone Knee Bend Test;Slump Test;Straight Leg Raise   Slump test   Findings Negative   Prone Knee Bend Test   Findings Negative   Straight Leg Raise   Findings Negative                           PT Education - 02/24/15 1236    Education provided Yes   Education Details prone press up, standing extensions, flexion avoidance and sitting posture correction   Methods Explanation;Demonstration;Handout   Comprehension Verbalized understanding;Returned demonstration          PT Short Term Goals - 02/24/15 1254    PT SHORT TERM GOAL #1   Title The patient will report a good understanding of basic self care for management of back pain: lumbar roll, body mechanics and postural correction   Time 4   Period Weeks   Status New   PT SHORT TERM GOAL #2   Title The patient will  report a 25% improvement in pain and function with ADLs   Time 4   Period Weeks   Status New   PT SHORT TERM GOAL #3   Title Lumbar extension AROM improved to 25 degrees for greater ease with getting in/out of the car and other ADLs   Time 4   Period Weeks   Status New           PT Long Term Goals - 02/24/15 1323    PT LONG TERM GOAL #1   Title Patient will be independent in a safe, self progression of HEP for pain management, ROM and strength recovery.    Time 8   Period Weeks   Status New   PT LONG TERM GOAL #2   Title The patient will report an overall improvement in pain and function at 50%   Time 8   Period Weeks   Status New   PT LONG TERM GOAL #3   Title The patient will have centralized symptoms 75% of the time with daily activities   Time 8   Period Weeks   Status New   PT LONG TERM GOAL #4   Title Improved hip flexor muscle length to 10 degrees and lumbar extension AROM to 30 degrees needed for greater ease with walking community distances   Time 8   Period Weeks   Status New               Plan - 02/24/15 1241    Clinical Impression Statement The patient is a 59 year old male with a > 6 month history of LBP with left LE pain to his knee.  Pain is worsened with bending and sometimes sitting.  He is better when lying down.  Decreased lumbar AROM:  flex 60, ext 20, right and left sidebending 30 degrees.  Decreased HS and quad muscle lengths.  Repeated movement testing indicates a preference for extension.   Negative slump, SLR and prone knee bend.  Normal LE strength and sensation.  Recommended 2x/week however the patient states he wants to try the extension exercises for a while and will call to set up future appts as needed.     Pt will benefit from skilled therapeutic intervention in order to improve on the following deficits Pain;Decreased range of motion;Decreased strength;Difficulty walking;Improper body mechanics   Rehab Potential Good  Clinical  Impairments Affecting Rehab Potential unable to read/write   PT Frequency 2x / week   PT Duration 8 weeks   PT Treatment/Interventions ADLs/Self Care Home Management;Cryotherapy;Electrical Stimulation;Moist Heat;Therapeutic exercise;Therapeutic activities;Traction;Ultrasound;Manual techniques;Dry needling   PT Next Visit Plan assess response to prone and standing extension exercises, sitting with lumbar roll and flexion avoidance and progress as needed; modalities, body mechanics education          G-Codes - 2015/03/10 1328    Functional Assessment Tool Used clinical judgement    Functional Limitation Mobility: Walking and moving around   Mobility: Walking and Moving Around Current Status ([id number]) At least 40 percent but less than 60 percent impaired, limited or restricted   Mobility: Walking and Moving Around Goal Status [phone]) At least 20 percent but less than 40 percent impaired, limited or restricted     PHYSICAL THERAPY DISCHARGE SUMMARY  Visits from Start of Care: 1  Current functional level related to goals / functional outcomes: See clinical impressions above.  The patient did not return after initial evaluation.   Remaining deficits: As above   Education / Equipment: HEP Plan: Patient agrees to discharge.  Patient goals were not met. Patient is being discharged due to not returning since the last visit.  ?????        Problem List Patient Active Problem List   Diagnosis Date Noted  . Back muscle spasm 11/18/2013  . Hypertriglyceridemia 11/18/2013  . DM (diabetes mellitus) 11/18/2013  . Diabetes 04/01/2013  . Medication refill 02/13/2013  . Type II diabetes mellitus, uncontrolled 11/22/2012  . Unspecified essential hypertension 11/22/2012  . Chronic pain syndrome 11/22/2012  . Tobacco use disorder 11/22/2012  . Dental caries 11/22/2012    Alvera Singh 03/10/15, 1:32 PM  [facility] [address] [place], [place], [address] Phone: [phone]   Fax:  [phone]  Ruben Im, PT 10-Mar-2015 1:33 PM Phone: [phone] Fax: [phone]

## 2015-02-26 ENCOUNTER — Ambulatory Visit: Payer: Medicare Other | Attending: Internal Medicine

## 2015-02-26 DIAGNOSIS — E1165 Type 2 diabetes mellitus with hyperglycemia: Secondary | ICD-10-CM

## 2015-02-26 DIAGNOSIS — IMO0002 Reserved for concepts with insufficient information to code with codable children: Secondary | ICD-10-CM

## 2015-02-26 DIAGNOSIS — E119 Type 2 diabetes mellitus without complications: Secondary | ICD-10-CM | POA: Diagnosis not present

## 2015-02-26 LAB — LIPID PANEL
CHOL/HDL RATIO: 6.6 ratio — AB (ref <=5.0)
CHOLESTEROL: 165 mg/dL (ref 125–200)
HDL: 25 mg/dL — AB (ref >=40)
LDL Cholesterol: 88 mg/dL (ref <130)
Triglycerides: 258 mg/dL — ABNORMAL HIGH (ref <150)
VLDL: 52 mg/dL — ABNORMAL HIGH (ref <30)

## 2015-03-06 ENCOUNTER — Other Ambulatory Visit: Payer: Self-pay | Admitting: Internal Medicine

## 2015-03-12 ENCOUNTER — Telehealth: Payer: Self-pay

## 2015-03-12 NOTE — Telephone Encounter (Signed)
-----   Message from Lance Bosch, NP sent at 03/01/2015  7:40 PM EDT ----- His triglycerides are still elevated. They have come down a substantial amount. Please remind him to not drink alcohol as this may make his levels worse. He may increase almonds and salmon to bring up his good cholesterol levels.

## 2015-03-12 NOTE — Telephone Encounter (Signed)
Nurse called patient, patient verified date of birth. Patient aware of triglycerides are still elevated but have came down a lot.  Patient agrees to not drink alcohol and reports that may be why his levels are high. His friends had a 3 day birthday party for him and he drink alcohol then.  Patient aware of eating almonds and salmon to help bring up his good cholesterol levels.  Patient voices understanding and has no further questions at this time.

## 2015-04-06 ENCOUNTER — Telehealth: Payer: Self-pay | Admitting: Internal Medicine

## 2015-04-06 NOTE — Telephone Encounter (Signed)
Patient returned nurses phone call.

## 2015-04-06 NOTE — Telephone Encounter (Signed)
Returned patient phone call Patient not available Left message on voice mail to return our call Patients RX for gemfibrozil was filled in July with 5 refills Patient needs to call his pharmacy Patient also requesting refill on his Tramadol

## 2015-04-06 NOTE — Telephone Encounter (Signed)
Patient came in requesting a medication refill for Tramadol and Gemfibrozil. Please follow up.

## 2015-04-07 NOTE — Telephone Encounter (Signed)
Patient called to speak to nurse in regards to his prescriptions. Please f/u

## 2015-04-08 NOTE — Telephone Encounter (Signed)
Patient last seen by [facility]. Request for tramadol routed.

## 2015-04-09 NOTE — Telephone Encounter (Signed)
May fill 30 tablets of Tramadol

## 2015-05-09 ENCOUNTER — Other Ambulatory Visit: Payer: Self-pay | Admitting: Internal Medicine

## 2015-05-11 ENCOUNTER — Other Ambulatory Visit: Payer: Self-pay | Admitting: Internal Medicine

## 2015-06-02 ENCOUNTER — Other Ambulatory Visit: Payer: Self-pay | Admitting: Internal Medicine

## 2015-08-31 ENCOUNTER — Encounter: Payer: Self-pay | Admitting: Internal Medicine

## 2015-08-31 ENCOUNTER — Ambulatory Visit: Payer: Medicare Other | Attending: Internal Medicine | Admitting: Internal Medicine

## 2015-08-31 ENCOUNTER — Other Ambulatory Visit: Payer: Self-pay | Admitting: Internal Medicine

## 2015-08-31 ENCOUNTER — Telehealth: Payer: Self-pay

## 2015-08-31 VITALS — BP 128/86 | HR 72 | Temp 97.8°F | Resp 16 | Ht 72.0 in | Wt 218.0 lb

## 2015-08-31 DIAGNOSIS — IMO0002 Reserved for concepts with insufficient information to code with codable children: Secondary | ICD-10-CM

## 2015-08-31 DIAGNOSIS — J069 Acute upper respiratory infection, unspecified: Secondary | ICD-10-CM | POA: Diagnosis not present

## 2015-08-31 DIAGNOSIS — M1990 Unspecified osteoarthritis, unspecified site: Secondary | ICD-10-CM | POA: Diagnosis not present

## 2015-08-31 DIAGNOSIS — E781 Pure hyperglyceridemia: Secondary | ICD-10-CM | POA: Diagnosis not present

## 2015-08-31 DIAGNOSIS — Z79899 Other long term (current) drug therapy: Secondary | ICD-10-CM | POA: Diagnosis not present

## 2015-08-31 DIAGNOSIS — Z23 Encounter for immunization: Secondary | ICD-10-CM

## 2015-08-31 DIAGNOSIS — Z7984 Long term (current) use of oral hypoglycemic drugs: Secondary | ICD-10-CM | POA: Diagnosis not present

## 2015-08-31 DIAGNOSIS — G894 Chronic pain syndrome: Secondary | ICD-10-CM | POA: Diagnosis not present

## 2015-08-31 DIAGNOSIS — E1141 Type 2 diabetes mellitus with diabetic mononeuropathy: Secondary | ICD-10-CM | POA: Diagnosis not present

## 2015-08-31 DIAGNOSIS — Z09 Encounter for follow-up examination after completed treatment for conditions other than malignant neoplasm: Secondary | ICD-10-CM | POA: Insufficient documentation

## 2015-08-31 DIAGNOSIS — F17210 Nicotine dependence, cigarettes, uncomplicated: Secondary | ICD-10-CM | POA: Diagnosis not present

## 2015-08-31 DIAGNOSIS — E1165 Type 2 diabetes mellitus with hyperglycemia: Secondary | ICD-10-CM

## 2015-08-31 LAB — POCT URINALYSIS DIPSTICK
BILIRUBIN UA: NEGATIVE
Glucose, UA: 500
Ketones, UA: NEGATIVE
LEUKOCYTES UA: NEGATIVE
NITRITE UA: NEGATIVE
PH UA: 5.5
PROTEIN UA: NEGATIVE
Spec Grav, UA: 1.01
Urobilinogen, UA: 0.2

## 2015-08-31 LAB — POCT GLYCOSYLATED HEMOGLOBIN (HGB A1C): HEMOGLOBIN A1C: 8.4

## 2015-08-31 LAB — GLUCOSE, POCT (MANUAL RESULT ENTRY): POC Glucose: 392 mg/dl — AB (ref 70–99)

## 2015-08-31 MED ORDER — AMOXICILLIN-POT CLAVULANATE 875-125 MG PO TABS: 1.0000 | ORAL_TABLET | Freq: Two times a day (BID) | ORAL | Status: DC

## 2015-08-31 MED ORDER — METFORMIN HCL 1000 MG PO TABS
1000.0000 mg | ORAL_TABLET | Freq: Two times a day (BID) | ORAL | Status: DC
Start: 2015-08-31 — End: 2016-03-04

## 2015-08-31 MED ORDER — FLUTICASONE PROPIONATE 50 MCG/ACT NA SUSP: 2.0000 | Freq: Every day | NASAL | Status: DC

## 2015-08-31 MED ORDER — LISINOPRIL 5 MG PO TABS: 5.0000 mg | ORAL_TABLET | Freq: Every day | ORAL | Status: DC

## 2015-08-31 MED ORDER — TRAMADOL HCL 50 MG PO TABS: 50.0000 mg | ORAL_TABLET | Freq: Two times a day (BID) | ORAL | Status: DC | PRN

## 2015-08-31 MED ORDER — GLIPIZIDE 10 MG PO TABS: ORAL_TABLET | ORAL | Status: DC

## 2015-08-31 MED ORDER — HYDROCORTISONE 1 % EX CREA: 1.0000 "application " | TOPICAL_CREAM | Freq: Two times a day (BID) | CUTANEOUS | Status: DC

## 2015-08-31 MED ORDER — GUAIFENESIN ER 600 MG PO TB12: 600.0000 mg | ORAL_TABLET | Freq: Two times a day (BID) | ORAL | Status: DC | PRN

## 2015-08-31 MED ORDER — INSULIN ASPART 100 UNIT/ML ~~LOC~~ SOLN
20.0000 [IU] | Freq: Once | SUBCUTANEOUS | Status: AC
  Administered 2015-08-31: 20 [IU] via SUBCUTANEOUS

## 2015-08-31 NOTE — Telephone Encounter (Signed)
Tried to contact patient  Patient not available Left message on voice mail to return our call 

## 2015-08-31 NOTE — Progress Notes (Signed)
Patient here for follow up on his diabetes and cholesterol Patient has been out of some of his diabetes medications for over a week Patient presents in office with elevated blood sugar 20 units novolog given per office protocol

## 2015-08-31 NOTE — Progress Notes (Signed)
Patient ID: Ryan Farmer, male   DOB: 22-Aug-1955, 60 y.o.   MRN: [id number]  CC: DM f/u  HPI: Ryan Farmer is a 60 y.o. male here today for a follow up visit.  Patient has past medical history of diabetes, hypertriglyceridemia, tobacco use, and arthritis. Patient states that he has been out of his Glipizide and Metformin for 2 weeks and has noticed that his sugars have been really elevated. He reports that in addition to not having medication he has not had a strict diabetic diet and has been drinking several cups of soda per day. He does not exercise. He has cut back to a 1/2 ppd. He would like a refill of his pain medication for chronic back pain.  Patient complains of a cough, nasal congestion, green mucous production, and chills that have been present for the past 10 days. He has tried OTC cough medication but has not had relief of symptoms.  No Known Allergies  Past Medical History  Diagnosis Date  . Diabetes mellitus   . Arthritis    Current Outpatient Prescriptions on File Prior to Visit  Medication Sig Dispense Refill  . gemfibrozil (LOPID) 600 MG tablet Take 1 tablet (600 mg total) by mouth 2 (two) times daily before a meal. 60 tablet 5  . glipiZIDE (GLUCOTROL) 10 MG tablet TAKE 2 TABLETS(20 MG) BY MOUTH TWICE DAILY BEFORE A MEAL 60 tablet 0  . hydrocortisone cream 1 % Apply 1 application topically 2 (two) times daily. 30 g 1  . metFORMIN (GLUCOPHAGE) 1000 MG tablet Take 1 tablet (1,000 mg total) by mouth 2 (two) times daily with a meal. 60 tablet 5  . traMADol (ULTRAM) 50 MG tablet Take 1 tablet (50 mg total) by mouth every 12 (twelve) hours as needed. 90 tablet 0  . albuterol (PROVENTIL HFA;VENTOLIN HFA) 108 (90 BASE) MCG/ACT inhaler Inhale 2 puffs into the lungs every 6 (six) hours as needed for wheezing. 1 Inhaler 3  . diazepam (VALIUM) 5 MG tablet Take 1 tablet (5 mg total) by mouth 2 (two) times daily. 15 tablet 0  . gemfibrozil (LOPID) 600 MG tablet TAKE 1 TABLET(600 MG) BY  MOUTH TWICE DAILY BEFORE A MEAL 180 tablet 5  . gemfibrozil (LOPID) 600 MG tablet TAKE 1 TABLET BY MOUTH TWICE DAILY MEAL BEFORE A MEAL 60 tablet 0  . glipiZIDE (GLUCOTROL) 10 MG tablet TAKE 2 TABLETS(20 MG) BY MOUTH TWICE DAILY BEFORE A MEAL 360 tablet 2  . GlucoCom Lancets MISC Check blood sugar TID and QHS 100 each 5  . glucose blood (CHOICE DM FORA G20 TEST STRIPS) test strip Use as instructed 100 each 12  . lisinopril (PRINIVIL,ZESTRIL) 5 MG tablet Take 1 tablet (5 mg total) by mouth daily. (Patient not taking: Reported on 08/31/2015) 30 tablet 3   No current facility-administered medications on file prior to visit.   Family History  Problem Relation Age of Onset  . Cancer Mother   . Stroke Father   . Heart disease Father   . Diabetes Father    Social History   Social History  . Marital Status: Single    Spouse Name: N/A  . Number of Children: N/A  . Years of Education: N/A   Occupational History  . Not on file.   Social History Main Topics  . Smoking status: Current Every Day Smoker -- 0.50 packs/day    Types: Cigarettes  . Smokeless tobacco: Not on file  . Alcohol Use: No     Comment:  occ  . Drug Use: No  . Sexual Activity: Not on file   Other Topics Concern  . Not on file   Social History Narrative    Review of Systems  Constitutional: Positive for chills.  HENT: Positive for congestion. Negative for sore throat.   Eyes: Negative for blurred vision.  Respiratory: Positive for cough. Negative for shortness of breath and wheezing.   Cardiovascular: Negative for chest pain, claudication and leg swelling.  Neurological: Positive for tingling (BLE).  All other systems reviewed and are negative.   Objective:   Filed Vitals:   08/31/15 1026  BP: 128/86  Pulse: 72  Temp: 97.8 F (36.6 C)  Resp: 16    Physical Exam  Constitutional: He is oriented to person, place, and time.  HENT:  Right Ear: External ear normal.  Left Ear: External ear normal.   Erythematous oropharynx  Cardiovascular: Normal rate, regular rhythm and normal heart sounds.   Pulmonary/Chest: Effort normal and breath sounds normal. He has no wheezes.  Lymphadenopathy:    He has no cervical adenopathy.  Neurological: He is alert and oriented to person, place, and time.  Psychiatric: He has a normal mood and affect.     Lab Results  Component Value Date   WBC 8.2 09/01/2013   HGB 17.2* 09/01/2013   HCT 50.4 09/01/2013   MCV 86.0 09/01/2013   PLT 200 09/01/2013   Lab Results  Component Value Date   CREATININE 0.74 09/01/2013   BUN 26* 09/01/2013   NA 129* 09/01/2013   K 4.7 09/01/2013   CL 90* 09/01/2013   CO2 24 09/01/2013    Lab Results  Component Value Date   HGBA1C 8.40 08/31/2015   Lipid Panel     Component Value Date/Time   CHOL 165 02/26/2015 0924   TRIG 258* 02/26/2015 0924   HDL 25* 02/26/2015 0924   CHOLHDL 6.6* 02/26/2015 0924   VLDL 52* 02/26/2015 0924   LDLCALC 88 02/26/2015 0924       Assessment and plan:   Alfreda was seen today for follow-up.  Diagnoses and all orders for this visit:  Uncontrolled type 2 diabetes mellitus with diabetic mononeuropathy, without long-term current use of insulin (HCC) -     Glucose (CBG) -     HgB A1c -     insulin aspart (novoLOG) injection 20 Units; Inject 0.2 mLs (20 Units total) into the skin once. -     Urinalysis Dipstick -     lisinopril (PRINIVIL,ZESTRIL) 5 MG tablet; Take 1 tablet (5 mg total) by mouth daily. Kidney protection -     metFORMIN (GLUCOPHAGE) 1000 MG tablet; Take 1 tablet (1,000 mg total) by mouth 2 (two) times daily with a meal. -     glipiZIDE (GLUCOTROL) 10 MG tablet; TAKE 2 TABLETS(20 MG) BY MOUTH TWICE DAILY BEFORE A MEAL -     Cancel: Basic Metabolic Panel -     Lipid Panel; Future -     Basic Metabolic Panel; Future A1C has trended up from last visit to 8.4%. I have stressed that he takes his medication daily and call ahead for refills. Patient will go back to  following ADA diet recommendations and cut out sugary drinks and cut back dramatically on carbs. We have discussed exercise and long term complications of uncontrolled diabetes. I would eventually like to take him off Glipizide to prevent beta cell burnout and switch him over to a Invokana or Januvia. Patient is resistant to insulin use.  Hypertriglyceridemia Patient still on Lopid, I will repeat Lipid panel when he returns next week for fasting labs. His last LDL was at goal.   URI (upper respiratory infection) -     fluticasone (FLONASE) 50 MCG/ACT nasal spray; Place 2 sprays into both nostrils daily. -     guaiFENesin (MUCINEX) 600 MG 12 hr tablet; Take 1 tablet (600 mg total) by mouth 2 (two) times daily as needed for cough or to loosen phlegm. -     amoxicillin-clavulanate (AUGMENTIN) 875-125 MG tablet; Take 1 tablet by mouth 2 (two) times daily.  Chronic pain syndrome -    Refilled traMADol (ULTRAM) 50 MG tablet; Take 1 tablet (50 mg total) by mouth every 12 (twelve) hours as needed. Stable, no new changes.   Need for diphtheria, tetanus, acellular pertussis and haemophilus influenzae vaccine -     Flu Vaccine QUAD 36+ mos PF IM (Fluarix & Fluzone Quad PF)   Return in about 3 months (around 11/28/2015) for Diabetes Mellitus.       Lance Bosch, [facility] [phone] 08/31/2015, 10:45 AM

## 2015-09-03 ENCOUNTER — Ambulatory Visit: Payer: Medicare Other | Attending: Internal Medicine

## 2015-09-03 DIAGNOSIS — IMO0002 Reserved for concepts with insufficient information to code with codable children: Secondary | ICD-10-CM

## 2015-09-03 DIAGNOSIS — E1141 Type 2 diabetes mellitus with diabetic mononeuropathy: Secondary | ICD-10-CM | POA: Diagnosis not present

## 2015-09-03 DIAGNOSIS — E1165 Type 2 diabetes mellitus with hyperglycemia: Secondary | ICD-10-CM | POA: Diagnosis not present

## 2015-09-03 DIAGNOSIS — G589 Mononeuropathy, unspecified: Secondary | ICD-10-CM | POA: Diagnosis not present

## 2015-09-03 DIAGNOSIS — E781 Pure hyperglyceridemia: Secondary | ICD-10-CM

## 2015-09-03 MED ORDER — GEMFIBROZIL 600 MG PO TABS: 600.0000 mg | ORAL_TABLET | Freq: Two times a day (BID) | ORAL | Status: DC

## 2015-09-04 ENCOUNTER — Telehealth: Payer: Self-pay

## 2015-09-04 LAB — BASIC METABOLIC PANEL
BUN: 28 mg/dL — ABNORMAL HIGH (ref 7–25)
CHLORIDE: 103 mmol/L (ref 98–110)
CO2: 25 mmol/L (ref 20–31)
Calcium: 9.7 mg/dL (ref 8.6–10.3)
Creat: 1.42 mg/dL — ABNORMAL HIGH (ref 0.70–1.33)
GLUCOSE: 112 mg/dL — AB (ref 65–99)
Potassium: 4.6 mmol/L (ref 3.5–5.3)
SODIUM: 137 mmol/L (ref 135–146)

## 2015-09-04 LAB — LIPID PANEL
Cholesterol: 222 mg/dL — ABNORMAL HIGH (ref 125–200)
HDL: 37 mg/dL — ABNORMAL LOW (ref >=40)
LDL CALC: 130 mg/dL — AB (ref <130)
TRIGLYCERIDES: 276 mg/dL — AB (ref <150)
Total CHOL/HDL Ratio: 6 Ratio — ABNORMAL HIGH (ref <=5.0)
VLDL: 55 mg/dL — ABNORMAL HIGH (ref <30)

## 2015-09-04 NOTE — Telephone Encounter (Signed)
Tried to contact patient about his medication that he Is requesting a refill on  Patient not available Unable to leave message-phone just kept ringing

## 2015-09-04 NOTE — Telephone Encounter (Signed)
-----   Message from Lance Bosch, NP sent at 09/03/2015  6:01 PM EST ----- What does he need it for  ----- Message -----    From: Dorothe Pea, LPN    Sent: [id number]   5:11 PM      To: Lance Bosch, NP  Mr lowers has requested a refill on diclofenac 75mg  This was prescribed by another  Physician Are you willing to refill this

## 2015-09-06 ENCOUNTER — Other Ambulatory Visit: Payer: Self-pay | Admitting: Internal Medicine

## 2015-09-07 ENCOUNTER — Telehealth: Payer: Self-pay

## 2015-09-07 MED ORDER — PRAVASTATIN SODIUM 20 MG PO TABS: 20.0000 mg | ORAL_TABLET | Freq: Every day | ORAL | Status: DC

## 2015-09-07 NOTE — Telephone Encounter (Signed)
Patient returned phone call and is aware to STOP his gemfibrozil and to start pravastatin

## 2015-09-07 NOTE — Telephone Encounter (Signed)
-----   Message from Lance Bosch, NP sent at 09/06/2015  4:59 PM EST ----- I want patient to stop Gemfibrozil----please make sure he understands and d/c it off his list. Explain that his total and bad cholesterol are now high and I want him to switch to Pravastatin 20 mg every evening. Please send new order. Go over specific diet changes. Kidneys levels are elevated which is likely a result of his long standing diabetes. If he wants to prevent future damage then he needs to make sure his sugars are controlled. I will repeat both lipid and kidney levels at next follow up

## 2015-09-07 NOTE — Telephone Encounter (Signed)
Tried to contact patient this am Patient not available Message left on voice mail to return our call Medication (pravastatin ) sent to pharmacy on file

## 2015-09-17 ENCOUNTER — Telehealth: Payer: Self-pay

## 2015-09-17 MED ORDER — GLUCOSE BLOOD VI STRP: ORAL_STRIP | Status: DC

## 2015-09-17 NOTE — Telephone Encounter (Signed)
Returned patient phone call Patient not available Message left on voice mail to return our call 

## 2015-09-29 ENCOUNTER — Telehealth: Payer: Self-pay

## 2015-09-29 NOTE — Telephone Encounter (Signed)
Returned patient phone call Patient not available Message left on voice mail to return our call 

## 2015-10-19 ENCOUNTER — Encounter: Payer: Self-pay | Admitting: Podiatry

## 2015-10-19 ENCOUNTER — Ambulatory Visit (INDEPENDENT_AMBULATORY_CARE_PROVIDER_SITE_OTHER): Payer: Medicare Other | Admitting: Podiatry

## 2015-10-19 ENCOUNTER — Ambulatory Visit (INDEPENDENT_AMBULATORY_CARE_PROVIDER_SITE_OTHER): Payer: Medicare Other

## 2015-10-19 VITALS — BP 137/81 | HR 66 | Resp 18

## 2015-10-19 DIAGNOSIS — R52 Pain, unspecified: Secondary | ICD-10-CM

## 2015-10-19 DIAGNOSIS — M7742 Metatarsalgia, left foot: Secondary | ICD-10-CM

## 2015-10-19 DIAGNOSIS — M7740 Metatarsalgia, unspecified foot: Secondary | ICD-10-CM | POA: Insufficient documentation

## 2015-10-19 NOTE — Progress Notes (Signed)
   Subjective:    Patient ID: Ryan Farmer, male    DOB: 19-Aug-1955, 60 y.o.   MRN: [id number]  HPI  60 year old male presents the office today for concerns of pain in the left foot submetatarsal to which is been ongoing for possibly 6 months. Denies any recent injury or trauma. Denies any swelling or redness. No tingling or numbness to his feet. He states the only really gets pain to this area maneuvers pressure down. He has no pain at rest. He is an old injury to the right foot several years ago. No pain of the right foot at this time. No other complaints at this time.  He is diabetic and states that mother recently his blood sugars been the low 100s however does run between 400-450.  Review of Systems  All other systems reviewed and are negative.      Objective:   Physical Exam General: AAO x3, NAD  Dermatological: Skin is warm, dry and supple bilateral. Nails x 10 are well manicured; remaining integument appears unremarkable at this time. There are no open sores, no preulcerative lesions, no rash or signs of infection present.  Vascular: Dorsalis Pedis artery and Posterior Tibial artery pedal pulses are 2/4 bilateral with immedate capillary fill time. Pedal hair growth present. No varicosities and no lower extremity edema present bilateral. There is no pain with calf compression, swelling, warmth, erythema.   Neruologic: Grossly intact via light touch bilateral. Vibratory intact via tuning fork bilateral. Protective threshold with Semmes Wienstein monofilament intact to all pedal sites bilateral. Patellar and Achilles deep tendon reflexes 2+ bilateral. No Babinski or clonus noted bilateral.   Musculoskeletal: There is no area pinpoint bony tenderness there is no pain vibratory sensation. Long submetatarsal 2 upon palpation subjectively he states this where he has pain on the does not have pain in the area at this time. There is no pain with vibratory sensation bilateral feet. There is no  smooth thickened deformity present. There is no amount edema, erythema, increase in warmth. MMT 5/5, range of motion intact.  Gait: Unassisted, Nonantalgic.      Assessment & Plan:  60 year old male submetatarsal to pain, capsulitis. -Treatment options discussed including all alternatives, risks, and complications -Etiology of symptoms were discussed -X-rays were obtained and reviewed with the patient. No definitive evidence of acute fracture at this time. -Discussed shoe gear changes. Metatarsal pads were dispensed. Will hold off on any kind of injection at this point as he is not having pain. Anti-inflammatories as needed. -Follow-up with symptoms not resolved within the next 4 weeks or sooner if any issues are to arise. In the meantime call any questions or concerns or any changes symptoms.  Celesta Gentile, DPM

## 2016-03-04 ENCOUNTER — Other Ambulatory Visit: Payer: Self-pay | Admitting: Internal Medicine

## 2016-03-04 DIAGNOSIS — E1141 Type 2 diabetes mellitus with diabetic mononeuropathy: Secondary | ICD-10-CM

## 2016-03-04 DIAGNOSIS — IMO0002 Reserved for concepts with insufficient information to code with codable children: Secondary | ICD-10-CM

## 2016-03-04 DIAGNOSIS — E1165 Type 2 diabetes mellitus with hyperglycemia: Principal | ICD-10-CM

## 2016-03-23 ENCOUNTER — Other Ambulatory Visit: Payer: Self-pay | Admitting: Pharmacist

## 2016-03-23 MED ORDER — GLUCOSE BLOOD VI STRP: ORAL_STRIP | 12 refills | Status: DC

## 2016-04-19 ENCOUNTER — Encounter: Payer: Self-pay | Admitting: Family Medicine

## 2016-04-19 ENCOUNTER — Ambulatory Visit: Payer: Medicare Other | Attending: Family Medicine | Admitting: Family Medicine

## 2016-04-19 VITALS — BP 130/75 | HR 80 | Temp 97.9°F | Ht 72.0 in | Wt 203.6 lb

## 2016-04-19 DIAGNOSIS — F17200 Nicotine dependence, unspecified, uncomplicated: Secondary | ICD-10-CM | POA: Diagnosis not present

## 2016-04-19 DIAGNOSIS — L309 Dermatitis, unspecified: Secondary | ICD-10-CM | POA: Diagnosis not present

## 2016-04-19 DIAGNOSIS — I10 Essential (primary) hypertension: Secondary | ICD-10-CM | POA: Insufficient documentation

## 2016-04-19 DIAGNOSIS — M25561 Pain in right knee: Secondary | ICD-10-CM | POA: Insufficient documentation

## 2016-04-19 DIAGNOSIS — Z7951 Long term (current) use of inhaled steroids: Secondary | ICD-10-CM | POA: Insufficient documentation

## 2016-04-19 DIAGNOSIS — M549 Dorsalgia, unspecified: Secondary | ICD-10-CM | POA: Insufficient documentation

## 2016-04-19 DIAGNOSIS — Z23 Encounter for immunization: Secondary | ICD-10-CM

## 2016-04-19 DIAGNOSIS — Z7984 Long term (current) use of oral hypoglycemic drugs: Secondary | ICD-10-CM | POA: Diagnosis not present

## 2016-04-19 DIAGNOSIS — E119 Type 2 diabetes mellitus without complications: Secondary | ICD-10-CM | POA: Insufficient documentation

## 2016-04-19 DIAGNOSIS — M25562 Pain in left knee: Secondary | ICD-10-CM | POA: Diagnosis not present

## 2016-04-19 DIAGNOSIS — E118 Type 2 diabetes mellitus with unspecified complications: Secondary | ICD-10-CM | POA: Diagnosis not present

## 2016-04-19 DIAGNOSIS — E781 Pure hyperglyceridemia: Secondary | ICD-10-CM

## 2016-04-19 DIAGNOSIS — Z79899 Other long term (current) drug therapy: Secondary | ICD-10-CM | POA: Insufficient documentation

## 2016-04-19 DIAGNOSIS — E1141 Type 2 diabetes mellitus with diabetic mononeuropathy: Secondary | ICD-10-CM | POA: Diagnosis not present

## 2016-04-19 DIAGNOSIS — E1165 Type 2 diabetes mellitus with hyperglycemia: Secondary | ICD-10-CM

## 2016-04-19 DIAGNOSIS — G8929 Other chronic pain: Secondary | ICD-10-CM | POA: Insufficient documentation

## 2016-04-19 DIAGNOSIS — IMO0002 Reserved for concepts with insufficient information to code with codable children: Secondary | ICD-10-CM

## 2016-04-19 DIAGNOSIS — G894 Chronic pain syndrome: Secondary | ICD-10-CM | POA: Diagnosis not present

## 2016-04-19 LAB — GLUCOSE, POCT (MANUAL RESULT ENTRY): POC Glucose: 207 mg/dl — AB (ref 70–99)

## 2016-04-19 LAB — POCT GLYCOSYLATED HEMOGLOBIN (HGB A1C): HEMOGLOBIN A1C: 6.4

## 2016-04-19 MED ORDER — TRAMADOL HCL 50 MG PO TABS: 50.0000 mg | ORAL_TABLET | Freq: Two times a day (BID) | ORAL | 0 refills | Status: DC | PRN

## 2016-04-19 MED ORDER — HYDROCORTISONE 1 % EX CREA: 1.0000 "application " | TOPICAL_CREAM | Freq: Two times a day (BID) | CUTANEOUS | 1 refills | Status: DC

## 2016-04-19 MED ORDER — ACCU-CHEK SOFTCLIX LANCETS MISC: 1.0000 | Freq: Two times a day (BID) | 12 refills | Status: DC

## 2016-04-19 MED ORDER — GLIPIZIDE 10 MG PO TABS: ORAL_TABLET | ORAL | 3 refills | Status: DC

## 2016-04-19 MED ORDER — METFORMIN HCL 1000 MG PO TABS: 1000.0000 mg | ORAL_TABLET | Freq: Two times a day (BID) | ORAL | 5 refills | Status: DC

## 2016-04-19 MED ORDER — ACCU-CHEK AVIVA PLUS W/DEVICE KIT: 1.0000 | PACK | Freq: Three times a day (TID) | 0 refills | Status: DC

## 2016-04-19 MED ORDER — LISINOPRIL 5 MG PO TABS
5.0000 mg | ORAL_TABLET | Freq: Every day | ORAL | 5 refills | Status: DC
Start: 2016-04-19 — End: 2016-12-13

## 2016-04-19 MED ORDER — GLUCOSE BLOOD VI STRP: 1.0000 | ORAL_STRIP | Freq: Two times a day (BID) | 12 refills | Status: DC

## 2016-04-19 MED ORDER — PRAVASTATIN SODIUM 20 MG PO TABS
20.0000 mg | ORAL_TABLET | Freq: Every day | ORAL | 3 refills | Status: DC
Start: 2016-04-19 — End: 2016-12-13

## 2016-04-19 MED ORDER — GEMFIBROZIL 600 MG PO TABS: 600.0000 mg | ORAL_TABLET | Freq: Two times a day (BID) | ORAL | 3 refills | Status: DC

## 2016-04-19 NOTE — Patient Instructions (Addendum)
Ryan Farmer was seen today for medication refill.  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 -     pravastatin (PRAVACHOL) 20 MG tablet; Take 1 tablet (20 mg total) by mouth daily. -     Blood Glucose Monitoring Suppl (ACCU-CHEK AVIVA PLUS) w/Device KIT; 1 Device by Does not apply route 3 (three) times daily after meals. -     glucose blood (ACCU-CHEK AVIVA PLUS) test strip; 1 each by Other route 2 (two) times daily. -     ACCU-CHEK SOFTCLIX LANCETS lancets; 1 each by Other route 2 (two) times daily.  Chronic pain syndrome -     Discontinue: traMADol (ULTRAM) 50 MG tablet; Take 1 tablet (50 mg total) by mouth every 12 (twelve) hours as needed. -     traMADol (ULTRAM) 50 MG tablet; Take 1 tablet (50 mg total) by mouth every 12 (twelve) hours as needed.  Essential hypertension -     lisinopril (PRINIVIL,ZESTRIL) 5 MG tablet; Take 1 tablet (5 mg total) by mouth daily. Kidney protection  Dermatitis -     Discontinue: hydrocortisone cream 1 %; Apply 1 application topically 2 (two) times daily. -     hydrocortisone cream 1 %; Apply 1 application topically 2 (two) times daily.  Tobacco use disorder  Other orders -     Flu Vaccine QUAD 36+ mos IM   Diabetes is very well controlled  F/u in 6 months for HTN   Dr. Adrian Blackwater

## 2016-04-19 NOTE — Progress Notes (Signed)
Subjective:  Patient ID: Ryan Farmer, male    DOB: [birth year]  Age: 60 y.o. MRN: [id number]  CC: Medication Refill   HPI Ryan Farmer presents for   1. CHRONIC DIABETES: he has worked to lose weight.   Disease Monitoring  Blood Sugar Ranges: not checking   Polyuria: no   Visual problems: no   Medication Compliance: yes, but out of metformin for one week   Medication Side Effects  Hypoglycemia: no   Preventitive Health Care  Eye Exam: due   Foot Exam: done today   2. Chronic pain: in back and both knees. Has had knee surgery. Feels joint laxity. No swelling in joints. Request tramadol refill or something stronger.   3. Smoking: he declines cessation at this time.   4. HTN: taking lisinopril. No cough, CP, SOB or edema.   Past Surgical History:  Procedure Laterality Date  . knee surgrery      Outpatient Medications Prior to Visit  Medication Sig Dispense Refill  . albuterol (PROVENTIL HFA;VENTOLIN HFA) 108 (90 BASE) MCG/ACT inhaler Inhale 2 puffs into the lungs every 6 (six) hours as needed for wheezing. 1 Inhaler 3  . amoxicillin-clavulanate (AUGMENTIN) 875-125 MG tablet Take 1 tablet by mouth 2 (two) times daily. 14 tablet 0  . diazepam (VALIUM) 5 MG tablet Take 1 tablet (5 mg total) by mouth 2 (two) times daily. 15 tablet 0  . fluticasone (FLONASE) 50 MCG/ACT nasal spray Place 2 sprays into both nostrils daily. 16 g 6  . gemfibrozil (LOPID) 600 MG tablet Take 1 tablet (600 mg total) by mouth 2 (two) times daily before a meal. 60 tablet 5  . glipiZIDE (GLUCOTROL) 10 MG tablet TAKE 2 TABLETS(20 MG) BY MOUTH TWICE DAILY BEFORE A MEAL 180 tablet 3  . GlucoCom Lancets MISC Check blood sugar TID and QHS 100 each 5  . glucose blood (ACCU-CHEK AVIVA PLUS) test strip Use as instructed for 3 times daily testing of blood glucose. 100 each 12  . guaiFENesin (MUCINEX) 600 MG 12 hr tablet Take 1 tablet (600 mg total) by mouth 2 (two) times daily as needed for cough or to loosen  phlegm. 60 tablet 0  . hydrocortisone cream 1 % Apply 1 application topically 2 (two) times daily. 30 g 1  . lisinopril (PRINIVIL,ZESTRIL) 5 MG tablet Take 1 tablet (5 mg total) by mouth daily. Kidney protection 30 tablet 3  . metFORMIN (GLUCOPHAGE) 1000 MG tablet TAKE 1 TABLET(1000 MG) BY MOUTH TWICE DAILY WITH A MEAL 60 tablet 0  . pravastatin (PRAVACHOL) 20 MG tablet Take 1 tablet (20 mg total) by mouth daily. 90 tablet 3  . traMADol (ULTRAM) 50 MG tablet Take 1 tablet (50 mg total) by mouth every 12 (twelve) hours as needed. 90 tablet 0   No facility-administered medications prior to visit.     ROS Review of Systems  Constitutional: Negative for chills, fatigue, fever and unexpected weight change.  Eyes: Negative for visual disturbance.  Respiratory: Negative for cough and shortness of breath.   Cardiovascular: Negative for chest pain, palpitations and leg swelling.  Gastrointestinal: Negative for abdominal pain, blood in stool, constipation, diarrhea, nausea and vomiting.  Endocrine: Negative for polydipsia, polyphagia and polyuria.  Musculoskeletal: Positive for back pain. Negative for arthralgias, gait problem, myalgias and neck pain.  Skin: Negative for rash.  Allergic/Immunologic: Negative for immunocompromised state.  Hematological: Negative for adenopathy. Does not bruise/bleed easily.  Psychiatric/Behavioral: Negative for dysphoric mood, sleep disturbance and suicidal ideas.  The patient is not nervous/anxious.     Objective:  BP 130/75   Pulse 80   Temp 97.9 F (36.6 C) (Oral)   Ht 6' (1.829 m)   Wt 203 lb 9.6 oz (92.4 kg)   BMI 27.61 kg/m   BP/Weight 04/19/2016 10/19/2015 [id number]  Systolic BP [id number]  Diastolic BP 75 81 86  Wt. (Lbs) 203.6 - 218  BMI 27.61 - 29.56   Physical Exam  Constitutional: He appears well-developed and well-nourished. No distress.  HENT:  Head: Normocephalic and atraumatic.  Neck: Normal range of motion. Neck supple.    Cardiovascular: Normal rate, regular rhythm, normal heart sounds and intact distal pulses.   Pulmonary/Chest: Effort normal and breath sounds normal.  Musculoskeletal: He exhibits no edema.  Neurological: He is alert.  Skin: Skin is warm and dry. No rash noted. No erythema.  Psychiatric: He has a normal mood and affect.   Lab Results  Component Value Date   HGBA1C 8.40 08/31/2015   Lab Results  Component Value Date   HGBA1C 6.4 04/19/2016    CBG 207    Assessment & Plan:   There are no diagnoses linked to this encounter.  No orders of the defined types were placed in this encounter.   Follow-up: No Follow-up on file.   Boykin Nearing MD

## 2016-04-19 NOTE — Assessment & Plan Note (Signed)
Controlled.  Continue lisinopril 

## 2016-04-19 NOTE — Assessment & Plan Note (Signed)
Current smoker Desires not to quit

## 2016-04-19 NOTE — Assessment & Plan Note (Signed)
Chronic back and knee pain Continue tramadol

## 2016-04-19 NOTE — Assessment & Plan Note (Signed)
Controlled. Continue current regimen. 

## 2016-05-17 ENCOUNTER — Other Ambulatory Visit: Payer: Self-pay | Admitting: Family Medicine

## 2016-05-17 DIAGNOSIS — G894 Chronic pain syndrome: Secondary | ICD-10-CM

## 2016-10-04 ENCOUNTER — Other Ambulatory Visit (INDEPENDENT_AMBULATORY_CARE_PROVIDER_SITE_OTHER): Payer: Self-pay | Admitting: Orthopaedic Surgery

## 2016-10-04 NOTE — Telephone Encounter (Signed)
Ok for refill? 

## 2016-10-26 ENCOUNTER — Emergency Department (HOSPITAL_COMMUNITY)
Admission: EM | Admit: 2016-10-26 | Discharge: 2016-10-26 | Disposition: A | Discharge status: Home / Self Care | Payer: Medicare Other | Attending: Emergency Medicine | Admitting: Emergency Medicine

## 2016-10-26 ENCOUNTER — Encounter (HOSPITAL_COMMUNITY): Payer: Self-pay | Admitting: Emergency Medicine

## 2016-10-26 DIAGNOSIS — Z7984 Long term (current) use of oral hypoglycemic drugs: Secondary | ICD-10-CM | POA: Insufficient documentation

## 2016-10-26 DIAGNOSIS — E119 Type 2 diabetes mellitus without complications: Secondary | ICD-10-CM | POA: Insufficient documentation

## 2016-10-26 DIAGNOSIS — Y9389 Activity, other specified: Secondary | ICD-10-CM | POA: Diagnosis not present

## 2016-10-26 DIAGNOSIS — X509XXA Other and unspecified overexertion or strenuous movements or postures, initial encounter: Secondary | ICD-10-CM | POA: Insufficient documentation

## 2016-10-26 DIAGNOSIS — Y999 Unspecified external cause status: Secondary | ICD-10-CM | POA: Insufficient documentation

## 2016-10-26 DIAGNOSIS — F17210 Nicotine dependence, cigarettes, uncomplicated: Secondary | ICD-10-CM | POA: Insufficient documentation

## 2016-10-26 DIAGNOSIS — Y92137 Garden or yard on military base as the place of occurrence of the external cause: Secondary | ICD-10-CM | POA: Insufficient documentation

## 2016-10-26 DIAGNOSIS — M25511 Pain in right shoulder: Secondary | ICD-10-CM | POA: Diagnosis not present

## 2016-10-26 DIAGNOSIS — I10 Essential (primary) hypertension: Secondary | ICD-10-CM | POA: Diagnosis not present

## 2016-10-26 MED ORDER — METHOCARBAMOL 500 MG PO TABS: 500.0000 mg | ORAL_TABLET | Freq: Two times a day (BID) | ORAL | 0 refills | Status: DC

## 2016-10-26 MED ORDER — METHOCARBAMOL 500 MG PO TABS
500.0000 mg | ORAL_TABLET | Freq: Once | ORAL | Status: AC
  Administered 2016-10-26: 500 mg via ORAL
  Filled 2016-10-26: qty 1

## 2016-10-26 NOTE — ED Triage Notes (Signed)
Pt reports having right shoulder/scapula pain. Pt denies any known injury. Pt reports he was mowing his lawn recently, pt reports he may have twisted wrong from shutting car door. Pt is unsure of any exact injury. Pt reports pain is a constant sore, worse when moving. 10/10 pain.

## 2016-10-26 NOTE — Discharge Instructions (Signed)
Take the prescribed medication as directed.  Continue your diclofenac as well.  Can use heat therapy if desired-- hot bath/shower, warm compress, heating pad, etc. Follow-up with your primary care doctor. Return to the ED for new or worsening symptoms.

## 2016-10-26 NOTE — ED Provider Notes (Signed)
[facility] DEPT Provider Note   CSN: [id number] Arrival date & time: 10/26/16  0425     History   Chief Complaint Chief Complaint  Patient presents with  . Shoulder Pain  . Back Pain    HPI Ryan Farmer is a 61 y.o. male.  The history is provided by the patient and medical records.  Shoulder Pain    Back Pain      61 year old male here with right shoulder pain. Patient reports he was doing some yard work recently but feels that he may have treated shoulder while shutting his car door. States the door does not align properly and you have to "finagle it" to get it closed.  States pain is localized to the right shoulder blade. Describes it as a nagging, sharp pain. States he feels it is sharp and get comfortable. He denies any numbness or weakness of the right arm. No chest pain or shortness of breath. States his doctor to prescribe him some diclofenac, he took a dose of his earlier which seemed to help somewhat. He has not had any prior right shoulder injuries or surgeries. He is right-hand dominant.  Past Medical History:  Diagnosis Date  . Arthritis   . Diabetes mellitus     Patient Active Problem List   Diagnosis Date Noted  . Diabetes type 2, controlled ([place]) 04/19/2016  . Metatarsalgia 10/19/2015  . Back muscle spasm 11/18/2013  . Hypertriglyceridemia 11/18/2013  . HTN (hypertension) 11/22/2012  . Chronic pain syndrome 11/22/2012  . Tobacco use disorder 11/22/2012  . Dental caries 11/22/2012    Past Surgical History:  Procedure Laterality Date  . knee surgrery         Home Medications    Prior to Admission medications   Medication Sig Start Date End Date Taking? Authorizing Provider  ACCU-CHEK SOFTCLIX LANCETS lancets 1 each by Other route 2 (two) times daily. 04/19/16   Josalyn Funches, MD  albuterol (PROVENTIL HFA;VENTOLIN HFA) 108 (90 BASE) MCG/ACT inhaler Inhale 2 puffs into the lungs every 6 (six) hours as needed for wheezing. 08/19/13   Reyne Dumas, MD  Blood Glucose Monitoring Suppl (ACCU-CHEK AVIVA PLUS) w/Device KIT 1 Device by Does not apply route 3 (three) times daily after meals. 04/19/16   Josalyn Funches, MD  diclofenac (VOLTAREN) 75 MG EC tablet TAKE 1 TABLET BY MOUTH TWICE DAILY WITH FOOD 10/04/16   Marybelle Killings, MD  fluticasone [facility]) 50 MCG/ACT nasal spray Place 2 sprays into both nostrils daily. 08/31/15   Lance Bosch, NP  gemfibrozil (LOPID) 600 MG tablet Take 1 tablet (600 mg total) by mouth 2 (two) times daily before a meal. 04/19/16   Josalyn Funches, MD  glipiZIDE (GLUCOTROL) 10 MG tablet TAKE 2 TABLETS(20 MG) BY MOUTH TWICE DAILY BEFORE A MEAL 04/19/16   Josalyn Funches, MD  glucose blood (ACCU-CHEK AVIVA PLUS) test strip 1 each by Other route 2 (two) times daily. 04/19/16   Josalyn Funches, MD  guaiFENesin (MUCINEX) 600 MG 12 hr tablet Take 1 tablet (600 mg total) by mouth 2 (two) times daily as needed for cough or to loosen phlegm. 08/31/15   Lance Bosch, NP  hydrocortisone cream 1 % Apply 1 application topically 2 (two) times daily. 04/19/16   Josalyn Funches, MD  lisinopril (PRINIVIL,ZESTRIL) 5 MG tablet Take 1 tablet (5 mg total) by mouth daily. Kidney protection 04/19/16   Boykin Nearing, MD  metFORMIN (GLUCOPHAGE) 1000 MG tablet Take 1 tablet (1,000 mg total) by mouth  2 (two) times daily with a meal. 04/19/16   Josalyn Funches, MD  pravastatin (PRAVACHOL) 20 MG tablet Take 1 tablet (20 mg total) by mouth daily. 04/19/16   Boykin Nearing, MD  traMADol (ULTRAM) 50 MG tablet Take 1 tablet (50 mg total) by mouth every 12 (twelve) hours as needed. 04/19/16   Boykin Nearing, MD    Family History Family History  Problem Relation Age of Onset  . Cancer Mother   . Stroke Father   . Heart disease Father   . Diabetes Father     Social History Social History  Substance Use Topics  . Smoking status: Current Every Day Smoker    Packs/day: 0.50    Types: Cigarettes  . Smokeless tobacco: Never Used  .  Alcohol use No     Comment: occ     Allergies   Patient has no known allergies.   Review of Systems Review of Systems  Musculoskeletal: Positive for back pain.  All other systems reviewed and are negative.    Physical Exam Updated Vital Signs BP (!) 149/97 (BP Location: Right Arm)   Pulse 66   Temp 97.5 F (36.4 C) (Oral)   Resp 17   Ht 6' 1"  (1.854 m)   Wt 89.8 kg   SpO2 100%   BMI 26.12 kg/m   Physical Exam  Constitutional: He is oriented to person, place, and time. He appears well-developed and well-nourished.  HENT:  Head: Normocephalic and atraumatic.  Mouth/Throat: Oropharynx is clear and moist.  Eyes: Conjunctivae and EOM are normal. Pupils are equal, round, and reactive to light.  Neck: Normal range of motion.  Cardiovascular: Normal rate, regular rhythm and normal heart sounds.   Pulmonary/Chest: Effort normal and breath sounds normal.  Abdominal: Soft. Bowel sounds are normal.  Musculoskeletal: Normal range of motion.       Arms: Tenderness of the right mid scapular region, there is no bony deformity or signs of dislocation, full range of motion of the right shoulder, able to push and pull effectively without apparent pain, normal grip strength, no wrist drop, normal sensation throughout right arm  Neurological: He is alert and oriented to person, place, and time.  Skin: Skin is warm and dry.  Psychiatric: He has a normal mood and affect.  Nursing note and vitals reviewed.    ED Treatments / Results  Labs (all labs ordered are listed, but only abnormal results are displayed) Labs Reviewed - No data to display  EKG  EKG Interpretation None       Radiology No results found.  Procedures Procedures (including critical care time)  Medications Ordered in ED Medications  methocarbamol (ROBAXIN) tablet 500 mg (500 mg Oral Given 10/26/16 0520)     Initial Impression / Assessment and Plan / ED Course  I have reviewed the triage vital signs and  the nursing notes.  Pertinent labs & imaging results that were available during my care of the patient were reviewed by me and considered in my medical decision making (see chart for details).  61 y.o. M here with shoulder pain.  Started after yard work and [employer].  No falls or direct trauma.  Pain localized to shoulder blade and is reproducible on exam.  No neurologic deficits.  Denies chest pain or SOB.  Doubt ACS, PE, dissection.  Feel this is likely MSK.  Had some relief with his home diclofenac, will add muscle relaxer.  Discussed plan with patient, he acknowledged understanding and agreed with  plan of care.  Return precautions given for new or worsening symptoms.  Final Clinical Impressions(s) / ED Diagnoses   Final diagnoses:  Acute pain of right shoulder    New Prescriptions New Prescriptions   METHOCARBAMOL (ROBAXIN) 500 MG TABLET    Take 1 tablet (500 mg total) by mouth 2 (two) times daily.     Larene Pickett, PA-C 10/26/16 [place], MD 10/26/16 [phone]

## 2016-10-30 ENCOUNTER — Emergency Department (HOSPITAL_COMMUNITY)
Admission: EM | Admit: 2016-10-30 | Discharge: 2016-10-31 | Disposition: A | Discharge status: Home / Self Care | Payer: Medicare Other | Attending: Emergency Medicine | Admitting: Emergency Medicine

## 2016-10-30 ENCOUNTER — Encounter (HOSPITAL_COMMUNITY): Payer: Self-pay | Admitting: Emergency Medicine

## 2016-10-30 DIAGNOSIS — Z7984 Long term (current) use of oral hypoglycemic drugs: Secondary | ICD-10-CM | POA: Diagnosis not present

## 2016-10-30 DIAGNOSIS — B029 Zoster without complications: Secondary | ICD-10-CM | POA: Insufficient documentation

## 2016-10-30 DIAGNOSIS — Z79899 Other long term (current) drug therapy: Secondary | ICD-10-CM | POA: Insufficient documentation

## 2016-10-30 DIAGNOSIS — I10 Essential (primary) hypertension: Secondary | ICD-10-CM | POA: Insufficient documentation

## 2016-10-30 DIAGNOSIS — F17210 Nicotine dependence, cigarettes, uncomplicated: Secondary | ICD-10-CM | POA: Diagnosis not present

## 2016-10-30 DIAGNOSIS — E119 Type 2 diabetes mellitus without complications: Secondary | ICD-10-CM | POA: Diagnosis not present

## 2016-10-30 HISTORY — DX: Zoster without complications: B02.9

## 2016-10-30 NOTE — ED Triage Notes (Signed)
Pt reports he was seen for pain to R scapula several days ago, pt states he did have small rash in the area at that point, rash now extends around ribs, red raised, painful.  Pt reports he did have shingles 20+ years ago.

## 2016-10-31 LAB — I-STAT CREATININE, ED: CREATININE: 1.4 mg/dL — AB (ref 0.61–1.24)

## 2016-10-31 MED ORDER — HYDROCODONE-ACETAMINOPHEN 5-325 MG PO TABS: 2.0000 | ORAL_TABLET | ORAL | 0 refills | Status: DC | PRN

## 2016-10-31 MED ORDER — HYDROCODONE-ACETAMINOPHEN 5-325 MG PO TABS
1.0000 | ORAL_TABLET | Freq: Once | ORAL | Status: AC
  Administered 2016-10-31: 1 via ORAL
  Filled 2016-10-31: qty 1

## 2016-10-31 MED ORDER — VALACYCLOVIR HCL 500 MG PO TABS
1000.0000 mg | ORAL_TABLET | Freq: Once | ORAL | Status: AC
  Administered 2016-10-31: 1000 mg via ORAL
  Filled 2016-10-31: qty 2

## 2016-10-31 MED ORDER — VALACYCLOVIR HCL 1 G PO TABS: 1000.0000 mg | ORAL_TABLET | Freq: Three times a day (TID) | ORAL | 0 refills | Status: DC

## 2016-10-31 NOTE — ED Provider Notes (Signed)
[place] DEPT Provider Note   CSN: [id number] Arrival date & time: 10/30/16  2238     History   Chief Complaint Chief Complaint  Patient presents with  . Herpes Zoster    HPI Ryan Farmer is a 61 y.o. male.  HPI  61 year old Caucasian male past medical history significant for diabetes, hypertension presents to the ED today with complaints of right shoulder pain and rash that burns. States that he was seen on 4/18 for right shoulder and right side pain after an injury while he was working outside and likely pulled a muscle. Patient states that he had a small rash but it was barely visible. States that since then the pain has worsened and he has developed a worsening rash that is linear in nature. States that the pain is described as burning sensation. States he had shingles approximately 25 years ago and this feels similar. States that he has been working outside and thought maybe that it was poison however it is not itching. Denies any drainage. Denies any history of HIV or AIDS. Denies any history of cancer. Denies any fever, chills, nausea, vomiting.  Past Medical History:  Diagnosis Date  . Arthritis   . Diabetes mellitus   . Shingles     Patient Active Problem List   Diagnosis Date Noted  . Diabetes type 2, controlled ([place]) 04/19/2016  . Metatarsalgia 10/19/2015  . Back muscle spasm 11/18/2013  . Hypertriglyceridemia 11/18/2013  . HTN (hypertension) 11/22/2012  . Chronic pain syndrome 11/22/2012  . Tobacco use disorder 11/22/2012  . Dental caries 11/22/2012    Past Surgical History:  Procedure Laterality Date  . knee surgrery         Home Medications    Prior to Admission medications   Medication Sig Start Date End Date Taking? Authorizing Provider  ACCU-CHEK SOFTCLIX LANCETS lancets 1 each by Other route 2 (two) times daily. 04/19/16   Josalyn Funches, MD  albuterol (PROVENTIL HFA;VENTOLIN HFA) 108 (90 BASE) MCG/ACT inhaler Inhale 2 puffs into the  lungs every 6 (six) hours as needed for wheezing. 08/19/13   Reyne Dumas, MD  Blood Glucose Monitoring Suppl (ACCU-CHEK AVIVA PLUS) w/Device KIT 1 Device by Does not apply route 3 (three) times daily after meals. 04/19/16   Josalyn Funches, MD  diclofenac (VOLTAREN) 75 MG EC tablet TAKE 1 TABLET BY MOUTH TWICE DAILY WITH FOOD 10/04/16   Marybelle Killings, MD  fluticasone [facility]) 50 MCG/ACT nasal spray Place 2 sprays into both nostrils daily. 08/31/15   Lance Bosch, NP  gemfibrozil (LOPID) 600 MG tablet Take 1 tablet (600 mg total) by mouth 2 (two) times daily before a meal. 04/19/16   Josalyn Funches, MD  glipiZIDE (GLUCOTROL) 10 MG tablet TAKE 2 TABLETS(20 MG) BY MOUTH TWICE DAILY BEFORE A MEAL 04/19/16   Josalyn Funches, MD  glucose blood (ACCU-CHEK AVIVA PLUS) test strip 1 each by Other route 2 (two) times daily. 04/19/16   Josalyn Funches, MD  guaiFENesin (MUCINEX) 600 MG 12 hr tablet Take 1 tablet (600 mg total) by mouth 2 (two) times daily as needed for cough or to loosen phlegm. 08/31/15   Lance Bosch, NP  HYDROcodone-acetaminophen (NORCO/VICODIN) 5-325 MG tablet Take 2 tablets by mouth every 4 (four) hours as needed. 10/31/16   Doristine Devoid, PA-C  hydrocortisone cream 1 % Apply 1 application topically 2 (two) times daily. 04/19/16   Josalyn Funches, MD  lisinopril (PRINIVIL,ZESTRIL) 5 MG tablet Take 1 tablet (5 mg total)  by mouth daily. Kidney protection 04/19/16   Boykin Nearing, MD  metFORMIN (GLUCOPHAGE) 1000 MG tablet Take 1 tablet (1,000 mg total) by mouth 2 (two) times daily with a meal. 04/19/16   Josalyn Funches, MD  methocarbamol (ROBAXIN) 500 MG tablet Take 1 tablet (500 mg total) by mouth 2 (two) times daily. 10/26/16   Larene Pickett, PA-C  pravastatin (PRAVACHOL) 20 MG tablet Take 1 tablet (20 mg total) by mouth daily. 04/19/16   Boykin Nearing, MD  traMADol (ULTRAM) 50 MG tablet Take 1 tablet (50 mg total) by mouth every 12 (twelve) hours as needed. 04/19/16   Josalyn  Funches, MD  valACYclovir (VALTREX) 1000 MG tablet Take 1 tablet (1,000 mg total) by mouth 3 (three) times daily. 10/31/16   Doristine Devoid, PA-C    Family History Family History  Problem Relation Age of Onset  . Cancer Mother   . Stroke Father   . Heart disease Father   . Diabetes Father     Social History Social History  Substance Use Topics  . Smoking status: Current Every Day Smoker    Packs/day: 0.50    Types: Cigarettes  . Smokeless tobacco: Never Used  . Alcohol use No     Comment: occ     Allergies   Patient has no known allergies.   Review of Systems Review of Systems  Constitutional: Negative for chills and fever.  HENT: Negative for congestion.   Eyes: Negative for visual disturbance.  Respiratory: Negative for cough and shortness of breath.   Cardiovascular: Negative for chest pain.  Gastrointestinal: Negative for diarrhea, nausea and vomiting.  Musculoskeletal: Positive for back pain.  Skin: Positive for rash.     Physical Exam Updated Vital Signs BP (!) 153/94 (BP Location: Left Arm)   Pulse 73   Temp 97.6 F (36.4 C) (Oral)   Resp 18   Ht _0  (1.854 m)   Wt 89.8 kg   SpO2 98%   BMI 26.12 kg/m   Physical Exam  Constitutional: He is oriented to person, place, and time. He appears well-developed and well-nourished. No distress.  HENT:  Head: Normocephalic and atraumatic.  Eyes: Right eye exhibits no discharge. Left eye exhibits no discharge. No scleral icterus.  Neck: Normal range of motion. Neck supple.  Pulmonary/Chest: No respiratory distress.  Musculoskeletal: Normal range of motion.  Neurological: He is alert and oriented to person, place, and time.  Skin: Skin is warm and dry. Capillary refill takes less than 2 seconds. Rash noted. No pallor.  Erythematous maculopapular rash in linear dermatomal noted to the right lateral chest wall. No vesicular lesions noted. No purlulent drainage. Mild tenderness to palpation. No signs of  overlying cellulitis.   Nursing note and vitals reviewed.        ED Treatments / Results  Labs (all labs ordered are listed, but only abnormal results are displayed) Labs Reviewed  I-STAT CREATININE, ED - Abnormal; Notable for the following:       Result Value   Creatinine, Ser 1.40 (*)    All other components within normal limits    EKG  EKG Interpretation None       Radiology No results found.  Procedures Procedures (including critical care time)  Medications Ordered in ED Medications  valACYclovir (VALTREX) tablet 1,000 mg (not administered)     Initial Impression / Assessment and Plan / ED Course  I have reviewed the triage vital signs and the nursing notes.  Pertinent labs &  imaging results that were available during my care of the patient were reviewed by me and considered in my medical decision making (see chart for details).     61 year old Caucasian male past medical history significant for diabetes and hypertension presents to the ED today with complaints of right lateral chest wall pain and developing rash. Patient seen last week for pain and diagnosed with pulled muscle however the rash has since developed and worsened. Rash seems consistent with shingles. Follows dermatomal pattern. No overlying infection or cellulitis noted. Patient has no systemic symptoms including fever, nausea, vomiting. Will place patient on valacyclovir. Did check creatinine concerning for dosing given the patient had a mild elevated creatinine last year. Creatinine is 1.4 which is what was one year ago. Patient to follow up with primary care doctor this week for creatinine recheck. Patient verbalized understanding the plan of care. All questions were answered prior to discharge. Pt is hemodynamically stable, in NAD, & able to ambulate in the ED. Pain has been managed & has no complaints prior to dc. Pt is comfortable with above plan and is stable for discharge at this time. All  questions were answered prior to disposition. Strict return precautions for f/u to the ED were discussed. Pt discussed with Dr. Claudine Mouton who is agreeable to the above plan.    Final Clinical Impressions(s) / ED Diagnoses   Final diagnoses:  Herpes zoster without complication    New Prescriptions New Prescriptions   HYDROCODONE-ACETAMINOPHEN (NORCO/VICODIN) 5-325 MG TABLET    Take 2 tablets by mouth every 4 (four) hours as needed.   VALACYCLOVIR (VALTREX) 1000 MG TABLET    Take 1 tablet (1,000 mg total) by mouth 3 (three) times daily.     Doristine Devoid, PA-C 10/31/16 [place], MD 10/31/16 [phone]

## 2016-10-31 NOTE — ED Notes (Signed)
Pt has a raised red papular rash from his right shoulder blade going in a line under his right armpit and under right nipple. Itches, painful, and burning

## 2016-10-31 NOTE — Discharge Instructions (Signed)
You do have the shingles. Please take the antiviral medication as prescribed. Your creatinine was slightly elevated. Please follow-up with your primary care doctor this week for recheck and have her kidney function rechecked. Return to ED if he develops any worsening symptoms including signs of infection, fever or for any reason.

## 2016-11-07 ENCOUNTER — Encounter (HOSPITAL_COMMUNITY): Payer: Self-pay | Admitting: Emergency Medicine

## 2016-11-07 ENCOUNTER — Other Ambulatory Visit: Payer: Self-pay | Admitting: Family Medicine

## 2016-11-07 ENCOUNTER — Emergency Department (HOSPITAL_COMMUNITY)
Admission: EM | Admit: 2016-11-07 | Discharge: 2016-11-07 | Disposition: A | Discharge status: Home / Self Care | Payer: Medicare Other | Attending: Emergency Medicine | Admitting: Emergency Medicine

## 2016-11-07 DIAGNOSIS — Z760 Encounter for issue of repeat prescription: Secondary | ICD-10-CM | POA: Insufficient documentation

## 2016-11-07 DIAGNOSIS — I10 Essential (primary) hypertension: Secondary | ICD-10-CM | POA: Insufficient documentation

## 2016-11-07 DIAGNOSIS — F17210 Nicotine dependence, cigarettes, uncomplicated: Secondary | ICD-10-CM | POA: Diagnosis not present

## 2016-11-07 DIAGNOSIS — Z7984 Long term (current) use of oral hypoglycemic drugs: Secondary | ICD-10-CM | POA: Diagnosis not present

## 2016-11-07 DIAGNOSIS — IMO0002 Reserved for concepts with insufficient information to code with codable children: Secondary | ICD-10-CM

## 2016-11-07 DIAGNOSIS — E1165 Type 2 diabetes mellitus with hyperglycemia: Principal | ICD-10-CM

## 2016-11-07 DIAGNOSIS — Z79899 Other long term (current) drug therapy: Secondary | ICD-10-CM | POA: Insufficient documentation

## 2016-11-07 DIAGNOSIS — E119 Type 2 diabetes mellitus without complications: Secondary | ICD-10-CM | POA: Diagnosis not present

## 2016-11-07 DIAGNOSIS — E1141 Type 2 diabetes mellitus with diabetic mononeuropathy: Secondary | ICD-10-CM

## 2016-11-07 DIAGNOSIS — B029 Zoster without complications: Secondary | ICD-10-CM | POA: Diagnosis not present

## 2016-11-07 MED ORDER — HYDROCODONE-ACETAMINOPHEN 5-325 MG PO TABS: 1.0000 | ORAL_TABLET | ORAL | 0 refills | Status: DC | PRN

## 2016-11-07 MED ORDER — DIPHENHYDRAMINE HCL 25 MG PO CAPS: 25.0000 mg | ORAL_CAPSULE | Freq: Four times a day (QID) | ORAL | 0 refills | Status: DC | PRN

## 2016-11-07 MED ORDER — VALACYCLOVIR HCL 1 G PO TABS: 1000.0000 mg | ORAL_TABLET | Freq: Three times a day (TID) | ORAL | 0 refills | Status: AC

## 2016-11-07 MED ORDER — METHOCARBAMOL 500 MG PO TABS: 500.0000 mg | ORAL_TABLET | Freq: Every evening | ORAL | 0 refills | Status: DC | PRN

## 2016-11-07 NOTE — Discharge Instructions (Signed)
Take Valtrex three times daily Take Norco as needed for pain Take muscle relaxer as needed Take benadryl for itching as needed

## 2016-11-07 NOTE — ED Provider Notes (Signed)
[place] DEPT Provider Note   CSN: [id number] Arrival date & time: 11/07/16  0708     History   Chief Complaint Chief Complaint  Patient presents with  . Medication Refill    HPI Ryan Farmer is a 61 y.o. male who presents with pain due to shingles. PMH significant for DM Type 2, hx of shingles, chronic pain disorder. He was diagnosed with shingles 8 days ago. It is on the right side of his chest and wraps around to right side of his back. He states that he has been taking the Valtrex and Hydrocodone as prescribed. He ran out yesterday and has ongoing symptoms. He reports a burning pain and itching.   HPI  Past Medical History:  Diagnosis Date  . Arthritis   . Diabetes mellitus   . Shingles     Patient Active Problem List   Diagnosis Date Noted  . Diabetes type 2, controlled ([place]) 04/19/2016  . Metatarsalgia 10/19/2015  . Back muscle spasm 11/18/2013  . Hypertriglyceridemia 11/18/2013  . HTN (hypertension) 11/22/2012  . Chronic pain syndrome 11/22/2012  . Tobacco use disorder 11/22/2012  . Dental caries 11/22/2012    Past Surgical History:  Procedure Laterality Date  . knee surgrery         Home Medications    Prior to Admission medications   Medication Sig Start Date End Date Taking? Authorizing Provider  ACCU-CHEK SOFTCLIX LANCETS lancets 1 each by Other route 2 (two) times daily. 04/19/16   Josalyn Funches, MD  albuterol (PROVENTIL HFA;VENTOLIN HFA) 108 (90 BASE) MCG/ACT inhaler Inhale 2 puffs into the lungs every 6 (six) hours as needed for wheezing. 08/19/13   Reyne Dumas, MD  Blood Glucose Monitoring Suppl (ACCU-CHEK AVIVA PLUS) w/Device KIT 1 Device by Does not apply route 3 (three) times daily after meals. 04/19/16   Josalyn Funches, MD  diclofenac (VOLTAREN) 75 MG EC tablet TAKE 1 TABLET BY MOUTH TWICE DAILY WITH FOOD 10/04/16   Marybelle Killings, MD  fluticasone [facility]) 50 MCG/ACT nasal spray Place 2 sprays into both nostrils daily. 08/31/15    Lance Bosch, NP  gemfibrozil (LOPID) 600 MG tablet Take 1 tablet (600 mg total) by mouth 2 (two) times daily before a meal. 04/19/16   Josalyn Funches, MD  glipiZIDE (GLUCOTROL) 10 MG tablet TAKE 2 TABLETS(20 MG) BY MOUTH TWICE DAILY BEFORE A MEAL 04/19/16   Josalyn Funches, MD  glucose blood (ACCU-CHEK AVIVA PLUS) test strip 1 each by Other route 2 (two) times daily. 04/19/16   Josalyn Funches, MD  guaiFENesin (MUCINEX) 600 MG 12 hr tablet Take 1 tablet (600 mg total) by mouth 2 (two) times daily as needed for cough or to loosen phlegm. 08/31/15   Lance Bosch, NP  HYDROcodone-acetaminophen (NORCO/VICODIN) 5-325 MG tablet Take 2 tablets by mouth every 4 (four) hours as needed. 10/31/16   Doristine Devoid, PA-C  hydrocortisone cream 1 % Apply 1 application topically 2 (two) times daily. 04/19/16   Josalyn Funches, MD  lisinopril (PRINIVIL,ZESTRIL) 5 MG tablet Take 1 tablet (5 mg total) by mouth daily. Kidney protection 04/19/16   Boykin Nearing, MD  metFORMIN (GLUCOPHAGE) 1000 MG tablet Take 1 tablet (1,000 mg total) by mouth 2 (two) times daily with a meal. 04/19/16   Josalyn Funches, MD  methocarbamol (ROBAXIN) 500 MG tablet Take 1 tablet (500 mg total) by mouth 2 (two) times daily. 10/26/16   Larene Pickett, PA-C  pravastatin (PRAVACHOL) 20 MG tablet Take 1 tablet (20  mg total) by mouth daily. 04/19/16   Boykin Nearing, MD  traMADol (ULTRAM) 50 MG tablet Take 1 tablet (50 mg total) by mouth every 12 (twelve) hours as needed. 04/19/16   Josalyn Funches, MD  valACYclovir (VALTREX) 1000 MG tablet Take 1 tablet (1,000 mg total) by mouth 3 (three) times daily. 10/31/16   Doristine Devoid, PA-C    Family History Family History  Problem Relation Age of Onset  . Cancer Mother   . Stroke Father   . Heart disease Father   . Diabetes Father     Social History Social History  Substance Use Topics  . Smoking status: Current Every Day Smoker    Packs/day: 0.50    Types: Cigarettes  .  Smokeless tobacco: Never Used  . Alcohol use No     Comment: occ     Allergies   Patient has no known allergies.   Review of Systems Review of Systems  Constitutional: Negative for fever.  Musculoskeletal: Negative for arthralgias and myalgias.  Skin: Positive for rash.     Physical Exam Updated Vital Signs Temp 97.7 F (36.5 C) (Oral)   Ht 6' (1.829 m)   Wt 90.7 kg   BMI 27.12 kg/m   Physical Exam  Constitutional: He is oriented to person, place, and time. He appears well-developed and well-nourished. No distress.  HENT:  Head: Normocephalic and atraumatic.  Eyes: Conjunctivae are normal. Pupils are equal, round, and reactive to light. Right eye exhibits no discharge. Left eye exhibits no discharge. No scleral icterus.  Neck: Normal range of motion.  Cardiovascular: Normal rate.   Pulmonary/Chest: Effort normal. No respiratory distress.  Abdominal: He exhibits no distension.  Neurological: He is alert and oriented to person, place, and time.  Skin: Skin is warm and dry.  Rash consistent with shingles ~T5-T6 dermatome on the right side   Psychiatric: He has a normal mood and affect. His behavior is normal.  Nursing note and vitals reviewed.    ED Treatments / Results  Labs (all labs ordered are listed, but only abnormal results are displayed) Labs Reviewed - No data to display  EKG  EKG Interpretation None       Radiology No results found.  Procedures Procedures (including critical care time)  Medications Ordered in ED Medications - No data to display   Initial Impression / Assessment and Plan / ED Course  I have reviewed the triage vital signs and the nursing notes.  Pertinent labs & imaging results that were available during my care of the patient were reviewed by me and considered in my medical decision making (see chart for details).  61 year old male with ongoing pain from shingles. Will refill Valtrex and Norco for 2 more days. He is also  asking for Benadryl for itching and refill of muscle relaxer. Advised to follow up with [facility] where he already established.  Final Clinical Impressions(s) / ED Diagnoses   Final diagnoses:  Herpes zoster without complication    New Prescriptions New Prescriptions   No medications on file     Recardo Evangelist, PA-C 11/07/16 [place], MD 11/07/16 [phone]

## 2016-11-07 NOTE — ED Triage Notes (Signed)
Pt reports needing pain medication and antiviral medication refills for his shingles.  Ambulatory, NAD, A&O.

## 2016-11-09 ENCOUNTER — Telehealth: Payer: Self-pay | Admitting: Family Medicine

## 2016-11-09 NOTE — Telephone Encounter (Signed)
PT came to the office requesting a refill for metFORMIN (GLUCOPHAGE) 1000 MG tablet  He was advice that he need to make an appt to see the pcp for future refill, please follow up with PT

## 2016-11-11 NOTE — Telephone Encounter (Signed)
Refill done for one month to get patient to f/u

## 2016-11-23 ENCOUNTER — Encounter: Payer: Self-pay | Admitting: Family Medicine

## 2016-12-13 ENCOUNTER — Ambulatory Visit: Payer: Medicare Other | Attending: Family Medicine | Admitting: Family Medicine

## 2016-12-13 ENCOUNTER — Encounter: Payer: Self-pay | Admitting: Family Medicine

## 2016-12-13 VITALS — BP 149/77 | HR 75 | Temp 98.0°F | Wt 211.8 lb

## 2016-12-13 DIAGNOSIS — E1141 Type 2 diabetes mellitus with diabetic mononeuropathy: Secondary | ICD-10-CM | POA: Diagnosis not present

## 2016-12-13 DIAGNOSIS — E781 Pure hyperglyceridemia: Secondary | ICD-10-CM | POA: Insufficient documentation

## 2016-12-13 DIAGNOSIS — M62830 Muscle spasm of back: Secondary | ICD-10-CM | POA: Diagnosis not present

## 2016-12-13 DIAGNOSIS — I10 Essential (primary) hypertension: Secondary | ICD-10-CM | POA: Diagnosis not present

## 2016-12-13 DIAGNOSIS — F1410 Cocaine abuse, uncomplicated: Secondary | ICD-10-CM | POA: Insufficient documentation

## 2016-12-13 DIAGNOSIS — Z794 Long term (current) use of insulin: Secondary | ICD-10-CM | POA: Insufficient documentation

## 2016-12-13 DIAGNOSIS — Z23 Encounter for immunization: Secondary | ICD-10-CM

## 2016-12-13 DIAGNOSIS — Z1159 Encounter for screening for other viral diseases: Secondary | ICD-10-CM

## 2016-12-13 DIAGNOSIS — IMO0001 Reserved for inherently not codable concepts without codable children: Secondary | ICD-10-CM

## 2016-12-13 DIAGNOSIS — R5383 Other fatigue: Secondary | ICD-10-CM | POA: Insufficient documentation

## 2016-12-13 DIAGNOSIS — E1165 Type 2 diabetes mellitus with hyperglycemia: Secondary | ICD-10-CM | POA: Insufficient documentation

## 2016-12-13 DIAGNOSIS — Z0000 Encounter for general adult medical examination without abnormal findings: Secondary | ICD-10-CM | POA: Diagnosis not present

## 2016-12-13 DIAGNOSIS — E119 Type 2 diabetes mellitus without complications: Secondary | ICD-10-CM | POA: Diagnosis not present

## 2016-12-13 DIAGNOSIS — G894 Chronic pain syndrome: Secondary | ICD-10-CM | POA: Insufficient documentation

## 2016-12-13 DIAGNOSIS — Z79899 Other long term (current) drug therapy: Secondary | ICD-10-CM | POA: Insufficient documentation

## 2016-12-13 DIAGNOSIS — B029 Zoster without complications: Secondary | ICD-10-CM | POA: Insufficient documentation

## 2016-12-13 DIAGNOSIS — M25511 Pain in right shoulder: Secondary | ICD-10-CM | POA: Diagnosis present

## 2016-12-13 LAB — POCT UA - MICROALBUMIN
Albumin/Creatinine Ratio, Urine, POC: >300
Creatinine, POC: 200 mg/dL
MICROALBUMIN (UR) POC: 150 mg/L

## 2016-12-13 LAB — POCT GLYCOSYLATED HEMOGLOBIN (HGB A1C): Hemoglobin A1C: 9.2

## 2016-12-13 LAB — GLUCOSE, POCT (MANUAL RESULT ENTRY): POC Glucose: 193 mg/dl — AB (ref 70–99)

## 2016-12-13 MED ORDER — GEMFIBROZIL 600 MG PO TABS: 600.0000 mg | ORAL_TABLET | Freq: Two times a day (BID) | ORAL | 5 refills | Status: DC

## 2016-12-13 MED ORDER — METFORMIN HCL 1000 MG PO TABS: 1000.0000 mg | ORAL_TABLET | Freq: Two times a day (BID) | ORAL | 5 refills | Status: DC

## 2016-12-13 MED ORDER — ACCU-CHEK AVIVA PLUS W/DEVICE KIT: 1.0000 | PACK | Freq: Three times a day (TID) | 0 refills | Status: DC

## 2016-12-13 MED ORDER — METHOCARBAMOL 500 MG PO TABS: 500.0000 mg | ORAL_TABLET | Freq: Every evening | ORAL | 0 refills | Status: DC | PRN

## 2016-12-13 MED ORDER — INSULIN PEN NEEDLE 31G X 8 MM MISC: 1.0000 "application " | Freq: Every day | 3 refills | Status: DC

## 2016-12-13 MED ORDER — LISINOPRIL 10 MG PO TABS: 10.0000 mg | ORAL_TABLET | Freq: Every day | ORAL | 5 refills | Status: DC

## 2016-12-13 MED ORDER — PRAVASTATIN SODIUM 20 MG PO TABS: 20.0000 mg | ORAL_TABLET | Freq: Every day | ORAL | 3 refills | Status: DC

## 2016-12-13 MED ORDER — INSULIN GLARGINE 100 UNIT/ML SOLOSTAR PEN: 10.0000 [IU] | PEN_INJECTOR | Freq: Every day | SUBCUTANEOUS | 11 refills | Status: DC

## 2016-12-13 MED ORDER — GLUCOSE BLOOD VI STRP: 1.0000 | ORAL_STRIP | Freq: Three times a day (TID) | 12 refills | Status: DC

## 2016-12-13 MED ORDER — ACCU-CHEK SOFTCLIX LANCETS MISC: 1.0000 | Freq: Three times a day (TID) | 12 refills | Status: DC

## 2016-12-13 MED ORDER — GLIPIZIDE 10 MG PO TABS: ORAL_TABLET | ORAL | 5 refills | Status: DC

## 2016-12-13 NOTE — Assessment & Plan Note (Signed)
Resolving with postherpetic pain Robaxin refilled Did not refilled diclofenac due to renal insufficiency

## 2016-12-13 NOTE — Assessment & Plan Note (Signed)
A: uncontrolled with weight gain Med: compliant P: Continue metformin and glipizide Add lantus 10 U daily

## 2016-12-13 NOTE — Assessment & Plan Note (Signed)
In back and knees Patient request tramadol Has reviewed and signed controlled substance  UDS ordered

## 2016-12-13 NOTE — Progress Notes (Signed)
Subjective:  Patient ID: Ryan Farmer, male    DOB: September 25, 1955  Age: 61 y.o. MRN: [id number]  CC: Hospitalization Follow-up   HPI Ryan Farmer presents for   1. ED f/u R shoulder shingles: symptoms started on 10/25/16 with pain in R shoulder. He first went to the ED on 10/26/16 nad was treated for MSK pain. He then developed rash. He returned to ED on 10/30/16 and was diagnosed with shingles.  He was treated with Vicodin and valtrex. He went back to ED on 11/07/16 for persistent pain. He was given 2 more days of Vicodin and valtrex. He reports persistent pain   2. CHRONIC DIABETES  Disease Monitoring  Blood Sugar Ranges: not checking   Polyuria: no   Visual problems: no   Medication Compliance: yes glipizide and metformin  Medication Side Effects  Hypoglycemia: no   Preventitive Health Care  Eye Exam: due   Foot Exam: done today   3. Chronic pain: in back and both knees. Has had knee surgery. Feels joint laxity. No swelling in joints. Request tramadol refill.   4. HTN: taking lisinopril. No cough, CP, SOB or edema. He has gained weight.   Past Surgical History:  Procedure Laterality Date  . knee surgrery      Outpatient Medications Prior to Visit  Medication Sig Dispense Refill  . ACCU-CHEK SOFTCLIX LANCETS lancets 1 each by Other route 2 (two) times daily. 100 each 12  . albuterol (PROVENTIL HFA;VENTOLIN HFA) 108 (90 BASE) MCG/ACT inhaler Inhale 2 puffs into the lungs every 6 (six) hours as needed for wheezing. 1 Inhaler 3  . Blood Glucose Monitoring Suppl (ACCU-CHEK AVIVA PLUS) w/Device KIT 1 Device by Does not apply route 3 (three) times daily after meals. 1 kit 0  . diclofenac (VOLTAREN) 75 MG EC tablet TAKE 1 TABLET BY MOUTH TWICE DAILY WITH FOOD 60 tablet 0  . diphenhydrAMINE (BENADRYL) 25 mg capsule Take 1 capsule (25 mg total) by mouth every 6 (six) hours as needed. 10 capsule 0  . fluticasone (FLONASE) 50 MCG/ACT nasal spray Place 2 sprays into both nostrils  daily. 16 g 6  . gemfibrozil (LOPID) 600 MG tablet Take 1 tablet (600 mg total) by mouth 2 (two) times daily before a meal. 180 tablet 3  . glipiZIDE (GLUCOTROL) 10 MG tablet TAKE 2 TABLETS(20 MG) BY MOUTH TWICE DAILY BEFORE A MEAL 180 tablet 3  . glucose blood (ACCU-CHEK AVIVA PLUS) test strip 1 each by Other route 2 (two) times daily. 100 each 12  . guaiFENesin (MUCINEX) 600 MG 12 hr tablet Take 1 tablet (600 mg total) by mouth 2 (two) times daily as needed for cough or to loosen phlegm. 60 tablet 0  . HYDROcodone-acetaminophen (NORCO/VICODIN) 5-325 MG tablet Take 1 tablet by mouth every 4 (four) hours as needed for severe pain. 10 tablet 0  . hydrocortisone cream 1 % Apply 1 application topically 2 (two) times daily. 120 g 1  . lisinopril (PRINIVIL,ZESTRIL) 5 MG tablet Take 1 tablet (5 mg total) by mouth daily. Kidney protection 30 tablet 5  . metFORMIN (GLUCOPHAGE) 1000 MG tablet TAKE 1 TABLET(1000 MG) BY MOUTH TWICE DAILY WITH A MEAL 60 tablet 0  . methocarbamol (ROBAXIN) 500 MG tablet Take 1 tablet (500 mg total) by mouth at bedtime and may repeat dose one time if needed. 10 tablet 0  . pravastatin (PRAVACHOL) 20 MG tablet Take 1 tablet (20 mg total) by mouth daily. 90 tablet 3   No facility-administered  medications prior to visit.     ROS Review of Systems  Constitutional: Positive for fatigue. Negative for chills, fever and unexpected weight change.  Eyes: Negative for visual disturbance.  Respiratory: Negative for cough and shortness of breath.   Cardiovascular: Negative for chest pain, palpitations and leg swelling.  Gastrointestinal: Negative for abdominal pain, blood in stool, constipation, diarrhea, nausea and vomiting.  Endocrine: Negative for polydipsia, polyphagia and polyuria.  Musculoskeletal: Positive for back pain. Negative for arthralgias, gait problem, myalgias and neck pain.  Skin: Positive for rash.  Allergic/Immunologic: Negative for immunocompromised state.    Hematological: Negative for adenopathy. Does not bruise/bleed easily.  Psychiatric/Behavioral: Negative for dysphoric mood, sleep disturbance and suicidal ideas. The patient is not nervous/anxious.     Objective:  BP (!) 149/77   Pulse 75   Temp 98 F (36.7 C) (Oral)   Wt 211 lb 12.8 oz (96.1 kg)   SpO2 97%   BMI 28.73 kg/m   BP/Weight 12/13/2016 11/07/2016 8/92/1194  Systolic BP 174 - 081  Diastolic BP 77 - 86  Wt. (Lbs) 211.8 200 -  BMI 28.73 27.12 -   BP Readings from Last 3 Encounters:  12/13/16 (!) 149/77  10/31/16 (!) 151/86  10/26/16 122/76   Physical Exam  Constitutional: He appears well-developed and well-nourished. No distress.  HENT:  Head: Normocephalic and atraumatic.  Neck: Normal range of motion. Neck supple.  Cardiovascular: Normal rate, regular rhythm, normal heart sounds and intact distal pulses.   Pulmonary/Chest: Effort normal and breath sounds normal.  Musculoskeletal: He exhibits no edema.  Neurological: He is alert.  Skin: Skin is warm and dry. Rash noted. Rash is maculopapular. No erythema.     Psychiatric: He has a normal mood and affect.   Lab Results  Component Value Date   HGBA1C 6.4 04/19/2016   Lab Results  Component Value Date   HGBA1C 9.2 12/13/2016   CBG 193    Chemistry      Component Value Date/Time   NA 137 09/03/2015 1700   K 4.6 09/03/2015 1700   CL 103 09/03/2015 1700   CO2 25 09/03/2015 1700   BUN 28 (H) 09/03/2015 1700   CREATININE 1.40 (H) 10/31/2016 0011   CREATININE 1.42 (H) 09/03/2015 1700      Component Value Date/Time   CALCIUM 9.7 09/03/2015 1700   ALKPHOS 156 (H) 09/01/2013 0754   AST 22 09/01/2013 0754   ALT 42 09/01/2013 0754   BILITOT <0.2 (L) 09/01/2013 0754     Lab Results  Component Value Date   WBC 8.2 09/01/2013   HGB 17.2 (H) 09/01/2013   HCT 50.4 09/01/2013   MCV 86.0 09/01/2013   PLT 200 09/01/2013    Assessment & Plan:  Ryan Farmer was seen today for hospitalization  follow-up.  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 -     Insulin Glargine (LANTUS) 100 UNIT/ML Solostar Pen; Inject 10 Units into the skin daily. -  Insulin Pen Needle (B-D ULTRAFINE III SHORT PEN) 31G X 8 MM MISC; 1 application by Does not apply route daily. -     POCT UA - Microalbumin -     pravastatin (PRAVACHOL) 20 MG tablet; Take 1 tablet (20 mg total) by mouth daily. -     Blood Glucose Monitoring Suppl (ACCU-CHEK AVIVA PLUS) w/Device KIT; 1 Device by Does not apply route 3 (three) times daily after meals. -     ACCU-CHEK SOFTCLIX LANCETS lancets; 1 each by Other route 3 (three) times daily. -     glucose blood (ACCU-CHEK AVIVA PLUS) test strip; 1 each by Other route 3 (three) times daily.  Chronic pain syndrome -     [id number] 11+Oxyco+Alc+Crt-Bund -     methocarbamol (ROBAXIN) 500 MG tablet; Take 1 tablet (500 mg total) by mouth at bedtime and may repeat dose one time if needed.  Back muscle spasm  Need for hepatitis C screening test -     Hepatitis c antibody (reflex)  Essential hypertension -     lisinopril (PRINIVIL,ZESTRIL) 10 MG tablet; Take 1 tablet (10 mg total) by mouth daily. Kidney protection -     BMP8+EGFR  Hypertriglyceridemia -     gemfibrozil (LOPID) 600 MG tablet; Take 1 tablet (600 mg total) by mouth 2 (two) times daily before a meal.  Fatigue, unspecified type -     CBC  Healthcare maintenance -     Ambulatory referral to Gastroenterology  Other orders -     Pneumococcal polysaccharide vaccine  23-valent greater than or equal to 2yo subcutaneous/IM   There are no diagnoses linked to this encounter.  No orders of the defined types were placed in this encounter.   Follow-up: Return in about 4 weeks (around 01/10/2017) for diabetes and chronic pain.   Boykin Nearing MD

## 2016-12-13 NOTE — Patient Instructions (Addendum)
Ryan Farmer was seen today for hospitalization follow-up.  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 -     Insulin Glargine (LANTUS) 100 UNIT/ML Solostar Pen; Inject 10 Units into the skin daily. -     Insulin Pen Needle (B-D ULTRAFINE III SHORT PEN) 31G X 8 MM MISC; 1 application by Does not apply route daily. -     POCT UA - Microalbumin -     pravastatin (PRAVACHOL) 20 MG tablet; Take 1 tablet (20 mg total) by mouth daily. -     Blood Glucose Monitoring Suppl (ACCU-CHEK AVIVA PLUS) w/Device KIT; 1 Device by Does not apply route 3 (three) times daily after meals. -     ACCU-CHEK SOFTCLIX LANCETS lancets; 1 each by Other route 3 (three) times daily. -     glucose blood (ACCU-CHEK AVIVA PLUS) test strip; 1 each by Other route 3 (three) times daily.  Chronic pain syndrome -     [id number] 11+Oxyco+Alc+Crt-Bund -     methocarbamol (ROBAXIN) 500 MG tablet; Take 1 tablet (500 mg total) by mouth at bedtime and may repeat dose one time if needed.  Back muscle spasm  Need for hepatitis C screening test -     Hepatitis c antibody (reflex)  Essential hypertension -     lisinopril (PRINIVIL,ZESTRIL) 10 MG tablet; Take 1 tablet (10 mg total) by mouth daily. Kidney protection  Hypertriglyceridemia -     gemfibrozil (LOPID) 600 MG tablet; Take 1 tablet (600 mg total) by mouth 2 (two) times daily before a meal.  Fatigue, unspecified type -     CBC  Healthcare maintenance -     Ambulatory referral to Gastroenterology   Diabetes blood sugar goals  Fasting  (in AM before breakfast, 8 hrs of no eating or drinking (except water or unsweetened coffee or tea): 90-130 2 hrs after meals: < 160,   No low sugars: nothing < 70   Please see pain management policy. If urine drug screen is negative today I can prescribed tramadol for chornic pain. For now I have refilled robaxin.  No diclofenac as there is mild kidney insuficnecy   F/u in 4 weeks for blood sugar review and chronic pain   Dr. Adrian Blackwater

## 2016-12-13 NOTE — Assessment & Plan Note (Signed)
A: elevated BP Med: compliant P: Increase lisinopril to 10 mg daily

## 2016-12-14 LAB — CBC
Hematocrit: 47.7 % (ref 37.5–51.0)
Hemoglobin: 15.9 g/dL (ref 13.0–17.7)
MCH: 29 pg (ref 26.6–33.0)
MCHC: 33.3 g/dL (ref 31.5–35.7)
MCV: 87 fL (ref 79–97)
PLATELETS: 272 10*3/uL (ref 150–379)
RBC: 5.49 x10E6/uL (ref 4.14–5.80)
RDW: 14.6 % (ref 12.3–15.4)
WBC: 7.8 10*3/uL (ref 3.4–10.8)

## 2016-12-14 LAB — HEPATITIS C ANTIBODY (REFLEX): HCV Ab: <0.1 s/co ratio (ref 0.0–0.9)

## 2016-12-14 LAB — BMP8+EGFR
BUN / CREAT RATIO: 32 — AB (ref 9–20)
BUN: 31 mg/dL — AB (ref 6–24)
CHLORIDE: 105 mmol/L (ref 96–106)
CO2: 19 mmol/L (ref 18–29)
Calcium: 9.2 mg/dL (ref 8.7–10.2)
Creatinine, Ser: 0.97 mg/dL (ref 0.76–1.27)
GFR calc non Af Amer: 85 mL/min/{1.73_m2} (ref >59)
GFR, EST AFRICAN AMERICAN: 98 mL/min/{1.73_m2} (ref >59)
Glucose: 165 mg/dL — ABNORMAL HIGH (ref 65–99)
Potassium: 4.8 mmol/L (ref 3.5–5.2)
SODIUM: 142 mmol/L (ref 134–144)

## 2016-12-14 LAB — HCV COMMENT:

## 2016-12-17 LAB — DRUG SCREEN 764883 11+OXYCO+ALC+CRT-BUND
AMPHETAMINES, URINE: NEGATIVE ng/mL
BENZODIAZ UR QL: NEGATIVE ng/mL
Barbiturate: NEGATIVE ng/mL
CANNABINOID QUANT UR: NEGATIVE ng/mL
CREATININE: 110.5 mg/dL (ref 20.0–300.0)
Ethanol: NEGATIVE %
Meperidine: NEGATIVE ng/mL
Methadone Screen, Urine: NEGATIVE ng/mL
OPIATE SCREEN URINE: NEGATIVE ng/mL
OXYCODONE+OXYMORPHONE UR QL SCN: NEGATIVE ng/mL
PH OF URINE: 5.8 (ref 4.5–8.9)
PROPOXYPHENE: NEGATIVE ng/mL
Phencyclidine: NEGATIVE ng/mL
Tramadol: NEGATIVE ng/mL

## 2016-12-17 LAB — COCAINE CONF, UR
BENZOYLECGONINE CONF, UR: 1680 ng/mL
Cocaine Metab Quant, Ur: POSITIVE — AB

## 2016-12-19 DIAGNOSIS — F1410 Cocaine abuse, uncomplicated: Secondary | ICD-10-CM | POA: Insufficient documentation

## 2016-12-19 NOTE — Assessment & Plan Note (Signed)
Cocaine positive UDS Unable to prescribe opioid pain medicines in setting of substance abuse  Patient also advised that cocaine use dose raise BP  Patient advised cocaine avoidance, directed to substance abuse resources in the community for help with not using cocaine:  [facility]

## 2017-01-02 ENCOUNTER — Telehealth: Payer: Self-pay

## 2017-01-02 NOTE — Telephone Encounter (Signed)
Pt was called and informed of lab results. 

## 2017-01-12 ENCOUNTER — Encounter: Payer: Self-pay | Admitting: Family Medicine

## 2017-08-18 ENCOUNTER — Encounter (HOSPITAL_COMMUNITY): Payer: Self-pay

## 2017-08-18 ENCOUNTER — Emergency Department (HOSPITAL_COMMUNITY)
Admission: EM | Admit: 2017-08-18 | Discharge: 2017-08-18 | Disposition: A | Discharge status: Home / Self Care | Payer: Medicare Other | Attending: Emergency Medicine | Admitting: Emergency Medicine

## 2017-08-18 ENCOUNTER — Other Ambulatory Visit: Payer: Self-pay

## 2017-08-18 DIAGNOSIS — Z794 Long term (current) use of insulin: Secondary | ICD-10-CM | POA: Insufficient documentation

## 2017-08-18 DIAGNOSIS — M5432 Sciatica, left side: Secondary | ICD-10-CM | POA: Insufficient documentation

## 2017-08-18 DIAGNOSIS — Z79899 Other long term (current) drug therapy: Secondary | ICD-10-CM | POA: Insufficient documentation

## 2017-08-18 DIAGNOSIS — E119 Type 2 diabetes mellitus without complications: Secondary | ICD-10-CM | POA: Insufficient documentation

## 2017-08-18 DIAGNOSIS — I10 Essential (primary) hypertension: Secondary | ICD-10-CM | POA: Insufficient documentation

## 2017-08-18 DIAGNOSIS — M5431 Sciatica, right side: Secondary | ICD-10-CM | POA: Diagnosis not present

## 2017-08-18 DIAGNOSIS — M549 Dorsalgia, unspecified: Secondary | ICD-10-CM | POA: Diagnosis present

## 2017-08-18 DIAGNOSIS — F17210 Nicotine dependence, cigarettes, uncomplicated: Secondary | ICD-10-CM | POA: Diagnosis not present

## 2017-08-18 LAB — URINALYSIS, ROUTINE W REFLEX MICROSCOPIC
BACTERIA UA: NONE SEEN
Bilirubin Urine: NEGATIVE
KETONES UR: NEGATIVE mg/dL
LEUKOCYTES UA: NEGATIVE
Nitrite: NEGATIVE
PROTEIN: NEGATIVE mg/dL
Specific Gravity, Urine: 1.03 (ref 1.005–1.030)
pH: 5 (ref 5.0–8.0)

## 2017-08-18 LAB — BASIC METABOLIC PANEL
Anion gap: 13 (ref 5–15)
BUN: 20 mg/dL (ref 6–20)
CHLORIDE: 101 mmol/L (ref 101–111)
CO2: 20 mmol/L — ABNORMAL LOW (ref 22–32)
Calcium: 10 mg/dL (ref 8.9–10.3)
Creatinine, Ser: 0.91 mg/dL (ref 0.61–1.24)
GFR calc non Af Amer: >60 mL/min (ref >60)
Glucose, Bld: 348 mg/dL — ABNORMAL HIGH (ref 65–99)
Potassium: 4.8 mmol/L (ref 3.5–5.1)
SODIUM: 134 mmol/L — AB (ref 135–145)

## 2017-08-18 LAB — CBC
HEMATOCRIT: 53.6 % — AB (ref 39.0–52.0)
HEMOGLOBIN: 17.9 g/dL — AB (ref 13.0–17.0)
MCH: 29.2 pg (ref 26.0–34.0)
MCHC: 33.4 g/dL (ref 30.0–36.0)
MCV: 87.3 fL (ref 78.0–100.0)
PLATELETS: 183 10*3/uL (ref 150–400)
RBC: 6.14 MIL/uL — AB (ref 4.22–5.81)
RDW: 14.5 % (ref 11.5–15.5)
WBC: 8.8 10*3/uL (ref 4.0–10.5)

## 2017-08-18 LAB — CBG MONITORING, ED: Glucose-Capillary: 389 mg/dL — ABNORMAL HIGH (ref 65–99)

## 2017-08-18 MED ORDER — LIDOCAINE 5 % EX PTCH: 1.0000 | MEDICATED_PATCH | CUTANEOUS | 0 refills | Status: DC

## 2017-08-18 MED ORDER — INSULIN ASPART 100 UNIT/ML ~~LOC~~ SOLN
8.0000 [IU] | Freq: Once | SUBCUTANEOUS | Status: AC
  Administered 2017-08-18: 8 [IU] via SUBCUTANEOUS
  Filled 2017-08-18: qty 1

## 2017-08-18 MED ORDER — MELOXICAM 7.5 MG PO TABS: 7.5000 mg | ORAL_TABLET | Freq: Every day | ORAL | 0 refills | Status: DC

## 2017-08-18 MED ORDER — CYCLOBENZAPRINE HCL 5 MG PO TABS: 5.0000 mg | ORAL_TABLET | Freq: Two times a day (BID) | ORAL | 0 refills | Status: DC | PRN

## 2017-08-18 NOTE — ED Triage Notes (Addendum)
Pt reports pain in his right buttocks that radiates down his right leg. Denies any injury. No change in urination. Denies numbness or tingling.   After talking with the patient, pt reports that he is diabetic and he has been taking his metformin, but no insulin.

## 2017-08-18 NOTE — ED Provider Notes (Signed)
[facility] Provider Note   CSN: [id number] Arrival date & time: 08/18/17  7858     History   Chief Complaint Chief Complaint  Patient presents with  . Leg Pain    HPI Ryan Farmer is a 62 y.o. male presenting for evaluation of bilateral leg pain.  Pt states that for the past week, he has had bilateral leg pain that begins in his buttocks and shoots down the back of his legs.  He reports a history of chronic back pain.  He has tried Aleve without improvement of his symptoms.  He takes tramadol for chronic back pain, this is not improved his symptoms.  Nothing makes it better or worse.  He denies history of similar.  He denies fall, trauma, or injury.  He denies other red flags of back pain including fever, chills, rash, loss of bowel or bladder control, numbness, h/o cancer or h/o IVDU.  Pain is constant and sharp.  It does not radiate.  Denies pain of the midline back. Additionally, pt states he has a history of diabetes, intermittently takes insulin versus metformin.  He states he is currently on both.  He has not followed up with his primary care doctor in 8 or 9 months.  HPI  Past Medical History:  Diagnosis Date  . Arthritis   . Diabetes mellitus   . Shingles     Patient Active Problem List   Diagnosis Date Noted  . Cocaine abuse ([place]) 12/19/2016  . Shingles outbreak 12/13/2016  . Diabetes type 2, uncontrolled ([place]) 04/19/2016  . Metatarsalgia 10/19/2015  . Back muscle spasm 11/18/2013  . Hypertriglyceridemia 11/18/2013  . HTN (hypertension) 11/22/2012  . Chronic pain syndrome 11/22/2012  . Tobacco use disorder 11/22/2012  . Dental caries 11/22/2012    Past Surgical History:  Procedure Laterality Date  . knee surgrery         Home Medications    Prior to Admission medications   Medication Sig Start Date End Date Taking? Authorizing Provider  ACCU-CHEK SOFTCLIX LANCETS lancets 1 each by Other route 3 (three) times  daily. 12/13/16   Funches, Adriana Mccallum, MD  Blood Glucose Monitoring Suppl (ACCU-CHEK AVIVA PLUS) w/Device KIT 1 Device by Does not apply route 3 (three) times daily after meals. 12/13/16   Funches, Adriana Mccallum, MD  cyclobenzaprine (FLEXERIL) 5 MG tablet Take 1 tablet (5 mg total) by mouth 2 (two) times daily as needed for muscle spasms. 08/18/17   Meher Kucinski, PA-C  diphenhydrAMINE (BENADRYL) 25 mg capsule Take 1 capsule (25 mg total) by mouth every 6 (six) hours as needed. 11/07/16   Recardo Evangelist, PA-C  fluticasone (FLONASE) 50 MCG/ACT nasal spray Place 2 sprays into both nostrils daily. 08/31/15   Lance Bosch, NP  gemfibrozil (LOPID) 600 MG tablet Take 1 tablet (600 mg total) by mouth 2 (two) times daily before a meal. 12/13/16   Funches, Josalyn, MD  glipiZIDE (GLUCOTROL) 10 MG tablet TAKE 2 TABLETS(20 MG) BY MOUTH TWICE DAILY BEFORE A MEAL 12/13/16   Funches, Josalyn, MD  glucose blood (ACCU-CHEK AVIVA PLUS) test strip 1 each by Other route 3 (three) times daily. 12/13/16   Funches, Adriana Mccallum, MD  hydrocortisone cream 1 % Apply 1 application topically 2 (two) times daily. 04/19/16   Funches, Adriana Mccallum, MD  Insulin Glargine (LANTUS) 100 UNIT/ML Solostar Pen Inject 10 Units into the skin daily. 12/13/16   Funches, Adriana Mccallum, MD  Insulin Pen Needle (B-D ULTRAFINE III SHORT PEN) 31G X  8 MM MISC 1 application by Does not apply route daily. 12/13/16   Funches, Adriana Mccallum, MD  lidocaine (LIDODERM) 5 % Place 1 patch onto the skin daily. Remove & Discard patch within 12 hours or as directed by MD 08/18/17   Boris Engelmann, PA-C  lisinopril (PRINIVIL,ZESTRIL) 10 MG tablet Take 1 tablet (10 mg total) by mouth daily. Kidney protection 12/13/16   Boykin Nearing, MD  meloxicam (MOBIC) 7.5 MG tablet Take 1 tablet (7.5 mg total) by mouth daily. 08/18/17   Patrich Heinze, PA-C  metFORMIN (GLUCOPHAGE) 1000 MG tablet Take 1 tablet (1,000 mg total) by mouth 2 (two) times daily with a meal. 12/13/16   Funches, Josalyn, MD    methocarbamol (ROBAXIN) 500 MG tablet Take 1 tablet (500 mg total) by mouth at bedtime and may repeat dose one time if needed. 12/13/16   Funches, Adriana Mccallum, MD  pravastatin (PRAVACHOL) 20 MG tablet Take 1 tablet (20 mg total) by mouth daily. 12/13/16   Boykin Nearing, MD    Family History Family History  Problem Relation Age of Onset  . Cancer Mother   . Stroke Father   . Heart disease Father   . Diabetes Father     Social History Social History   Tobacco Use  . Smoking status: Current Every Day Smoker    Packs/day: 0.50    Types: Cigarettes  . Smokeless tobacco: Never Used  Substance Use Topics  . Alcohol use: No    Comment: occ  . Drug use: Yes    Types: Marijuana     Allergies   Patient has no known allergies.   Review of Systems Review of Systems  Constitutional: Negative for chills and fever.  HENT: Negative for congestion.   Respiratory: Negative for cough, chest tightness and shortness of breath.   Cardiovascular: Negative for chest pain.  Gastrointestinal: Negative for abdominal pain.  Genitourinary:       No loss of bowel or bladder  Musculoskeletal: Positive for back pain and myalgias.  Skin: Negative for rash and wound.  Allergic/Immunologic: Negative for immunocompromised state.  Neurological: Negative for headaches.  Hematological: Does not bruise/bleed easily.  Psychiatric/Behavioral: Negative for confusion.     Physical Exam Updated Vital Signs BP (!) 140/97   Pulse 71   Temp 97.8 F (36.6 C) (Oral)   Resp 16   Ht 6' (1.829 m)   Wt 90.7 kg (200 lb)   SpO2 99%   BMI 27.12 kg/m   Physical Exam  Constitutional: He is oriented to person, place, and time. He appears well-developed and well-nourished. No distress.  HENT:  Head: Normocephalic and atraumatic.  Eyes: Conjunctivae and EOM are normal. Pupils are equal, round, and reactive to light.  Neck: Normal range of motion. Neck supple.  Cardiovascular: Normal rate, regular rhythm and  intact distal pulses.  Pulmonary/Chest: Effort normal and breath sounds normal. No respiratory distress. He has no wheezes.  Abdominal: Soft. He exhibits no distension. There is no tenderness.  Musculoskeletal: He exhibits tenderness.  No tenderness to palpation of back or midline spine.  Tenderness to palpation bilateral buttocks and posterior legs.  Patient is ambulatory.  Strength of lower extremities intact bilaterally.  Sensation intact bilaterally.  Soft compartments.  DTRs equal and intact bilaterally. No obvious deformity or injury.   Neurological: He is alert and oriented to person, place, and time. He has normal strength and normal reflexes. No sensory deficit. Coordination and gait normal.  Skin: Skin is warm and dry.  Psychiatric: He  has a normal mood and affect.  Nursing note and vitals reviewed.    ED Treatments / Results  Labs (all labs ordered are listed, but only abnormal results are displayed) Labs Reviewed  BASIC METABOLIC PANEL - Abnormal; Notable for the following components:      Result Value   Sodium 134 (*)    CO2 20 (*)    Glucose, Bld 348 (*)    All other components within normal limits  CBC - Abnormal; Notable for the following components:   RBC 6.14 (*)    Hemoglobin 17.9 (*)    HCT 53.6 (*)    All other components within normal limits  URINALYSIS, ROUTINE W REFLEX MICROSCOPIC - Abnormal; Notable for the following components:   Glucose, UA >=500 (*)    Hgb urine dipstick SMALL (*)    Squamous Epithelial / LPF 0-5 (*)    All other components within normal limits  CBG MONITORING, ED - Abnormal; Notable for the following components:   Glucose-Capillary 389 (*)    All other components within normal limits  CBG MONITORING, ED    EKG  EKG Interpretation None       Radiology No results found.  Procedures Procedures (including critical care time)  Medications Ordered in ED Medications  insulin aspart (novoLOG) injection 8 Units (8 Units  Subcutaneous Given 08/18/17 1308)     Initial Impression / Assessment and Plan / ED Course  I have reviewed the triage vital signs and the nursing notes.  Pertinent labs & imaging results that were available during my care of the patient were reviewed by me and considered in my medical decision making (see chart for details).     Patient presenting for evaluation of bilateral buttock/leg pain.  Physical exam reassuring, he is neurovascularly intact.  No pain over midline spine.  No red flags of back pain.  Likely sciatica.  However, patient with uncontrolled diabetes.  ?neuropathy.  Will attempt sciatica management and if this does not improve, have patient follow-up with primary care for further evaluation.  Stressed importance of follow-up for further management of his diabetes.  Patient requesting insulin today to lower his blood sugars.  Discussed we can do a one time dose, but this needs to be managed by primary care.  Doubt DKA, bicarb improved from prior and AG reassuring.  No ketones in his urine. Labs otherwise reassuring. Patient has diabetes, give prednisone for sciatica.  Will treat with muscle relaxers, NSAIDs, and lidocaine patches.  Patient to follow-up with primary care.  At this time, patient appears safe for discharge.  Return precautions given.  Patient states he understands and agrees to plan.   Final Clinical Impressions(s) / ED Diagnoses   Final diagnoses:  Bilateral sciatica    ED Discharge Orders        Ordered    cyclobenzaprine (FLEXERIL) 5 MG tablet  2 times daily PRN     08/18/17 1301    meloxicam (MOBIC) 7.5 MG tablet  Daily     08/18/17 1301    lidocaine (LIDODERM) 5 %  Every 24 hours     08/18/17 1301       Ezzard Ditmer, PA-C 08/18/17 1402    Sherwood Gambler, MD 08/18/17 2224

## 2017-08-18 NOTE — Discharge Instructions (Signed)
Take mobic once a day with meals.  Do not take other anti-inflammatories at the same time open (Advil, Motrin, naproxen, ibuprofen, Aleve). You may supplement with Tylenol if you need further pain control. Use Flexeril as needed for muscle stiffness or soreness.  Have caution, this may make you tired or groggy.  Do not drive or operate heavy machinery while taking this medicine. Use ice packs or heating pads if this helps control your pain. It is very important that you follow up with your primary care doctor.  Return to the emergency room if you develop numbness, loss of bowel or bladder control, high fevers, or any new or worsening symptoms.

## 2017-09-21 ENCOUNTER — Telehealth: Payer: Self-pay | Admitting: Family Medicine

## 2017-09-21 ENCOUNTER — Encounter: Payer: Self-pay | Admitting: Pharmacist

## 2017-09-21 ENCOUNTER — Ambulatory Visit: Payer: Medicare Other | Attending: Family Medicine | Admitting: Physician Assistant

## 2017-09-21 VITALS — BP 127/80 | HR 75 | Temp 98.6°F | Resp 18 | Ht 72.0 in | Wt 206.0 lb

## 2017-09-21 DIAGNOSIS — Z791 Long term (current) use of non-steroidal anti-inflammatories (NSAID): Secondary | ICD-10-CM | POA: Insufficient documentation

## 2017-09-21 DIAGNOSIS — M5440 Lumbago with sciatica, unspecified side: Secondary | ICD-10-CM | POA: Diagnosis not present

## 2017-09-21 DIAGNOSIS — Z79899 Other long term (current) drug therapy: Secondary | ICD-10-CM | POA: Diagnosis not present

## 2017-09-21 DIAGNOSIS — Z789 Other specified health status: Secondary | ICD-10-CM | POA: Diagnosis not present

## 2017-09-21 DIAGNOSIS — E1165 Type 2 diabetes mellitus with hyperglycemia: Secondary | ICD-10-CM | POA: Insufficient documentation

## 2017-09-21 DIAGNOSIS — Z794 Long term (current) use of insulin: Secondary | ICD-10-CM | POA: Diagnosis not present

## 2017-09-21 DIAGNOSIS — M545 Low back pain: Secondary | ICD-10-CM | POA: Diagnosis not present

## 2017-09-21 DIAGNOSIS — I10 Essential (primary) hypertension: Secondary | ICD-10-CM | POA: Diagnosis not present

## 2017-09-21 DIAGNOSIS — M1990 Unspecified osteoarthritis, unspecified site: Secondary | ICD-10-CM | POA: Insufficient documentation

## 2017-09-21 LAB — POCT GLYCOSYLATED HEMOGLOBIN (HGB A1C): HEMOGLOBIN A1C: 12.3

## 2017-09-21 LAB — GLUCOSE, POCT (MANUAL RESULT ENTRY): POC Glucose: 332 mg/dl — AB (ref 70–99)

## 2017-09-21 MED ORDER — INSULIN GLARGINE 100 UNIT/ML SOLOSTAR PEN: 20.0000 [IU] | PEN_INJECTOR | Freq: Every day | SUBCUTANEOUS | 11 refills | Status: DC

## 2017-09-21 MED ORDER — CYCLOBENZAPRINE HCL 5 MG PO TABS: 5.0000 mg | ORAL_TABLET | Freq: Every day | ORAL | 1 refills | Status: DC

## 2017-09-21 MED ORDER — LIDOCAINE 5 % EX PTCH: 1.0000 | MEDICATED_PATCH | CUTANEOUS | 3 refills | Status: DC

## 2017-09-21 MED ORDER — MELOXICAM 7.5 MG PO TABS: 7.5000 mg | ORAL_TABLET | Freq: Two times a day (BID) | ORAL | 0 refills | Status: DC

## 2017-09-21 NOTE — Patient Instructions (Signed)
Check blood sugars fasting and at bedtime and record.  Bring this to your next visit.

## 2017-09-21 NOTE — Telephone Encounter (Signed)
Pt called to request his  -lidocaine (LIDODERM) 5 %  Sent to  [id number] - [place], [place]  there was an error and [facility] does not have the prescription. Please follow up

## 2017-09-21 NOTE — Progress Notes (Signed)
PA submitted for lidocaine patches. Pending review by insurance [facility]).

## 2017-09-21 NOTE — Progress Notes (Signed)
Patient ID: Ryan Farmer, male   DOB: 1955/10/31, 62 y.o.   MRN: [id number]   Ryan Farmer, is a 62 y.o. male  FKC:[id number]  VCB:[id number]  DOB - 07-17-1955  Subjective:  Chief Complaint and HPI: Ryan Farmer is a 62 y.o. male here today to for multiple issues.  He has been using his sister's humalog intermittently.  Sometimes he is taking lantus and metformin.  He admits to poor compliance over but says he does take lantus 10 units most days.  Some polyuria and polydipsia.  No hypoglycemia.  Blood sugars running >300.    LBP that is chronic and intermittent.  He has been seen by chiropractor and ED.  C/o intermittent paresthesias.  NKI.  Meloxican once daily not helping.  Needs muscle relaxer for night time and requests RF of lidoderm patches.  Also requests other "pain meds"  ALso wants to see about having circumcision.     ROS:   Constitutional:  No f/c, No night sweats, No unexplained weight loss. EENT:  No vision changes, No blurry vision, No hearing changes. No mouth, throat, or ear problems.  Respiratory: No cough, No SOB Cardiac: No CP, no palpitations GI:  No abd pain, No N/V/D. GU: No Urinary s/sx Musculoskeletal: No joint pain Neuro: No headache, no dizziness, no motor weakness.  Skin: No rash Endocrine:  some polydipsia. some polyuria.  Psych: Denies SI/HI  No problems updated.  ALLERGIES: No Known Allergies  PAST MEDICAL HISTORY: Past Medical History:  Diagnosis Date  . Arthritis   . Diabetes mellitus   . Shingles     MEDICATIONS AT HOME: Prior to Admission medications   Medication Sig Start Date End Date Taking? Authorizing Provider  ACCU-CHEK SOFTCLIX LANCETS lancets 1 each by Other route 3 (three) times daily. 12/13/16  Yes Funches, Josalyn, MD  Blood Glucose Monitoring Suppl (ACCU-CHEK AVIVA PLUS) w/Device KIT 1 Device by Does not apply route 3 (three) times daily after meals. 12/13/16  Yes Funches, Josalyn, MD  glucose blood (ACCU-CHEK AVIVA PLUS) test  strip 1 each by Other route 3 (three) times daily. 12/13/16  Yes Funches, Josalyn, MD  hydrocortisone cream 1 % Apply 1 application topically 2 (two) times daily. 04/19/16  Yes Funches, Josalyn, MD  Insulin Pen Needle (B-D ULTRAFINE III SHORT PEN) 31G X 8 MM MISC 1 application by Does not apply route daily. 12/13/16  Yes Funches, Josalyn, MD  lidocaine (LIDODERM) 5 % Place 1 patch onto the skin daily. Remove & Discard patch within 12 hours or as directed by MD 09/21/17  Yes Argentina Donovan, PA-C  cyclobenzaprine (FLEXERIL) 5 MG tablet Take 1 tablet (5 mg total) by mouth at bedtime. 09/21/17   Argentina Donovan, PA-C  gemfibrozil (LOPID) 600 MG tablet Take 1 tablet (600 mg total) by mouth 2 (two) times daily before a meal. 12/13/16   Funches, Josalyn, MD  glipiZIDE (GLUCOTROL) 10 MG tablet TAKE 2 TABLETS(20 MG) BY MOUTH TWICE DAILY BEFORE A MEAL 12/13/16   Funches, Josalyn, MD  Insulin Glargine (LANTUS) 100 UNIT/ML Solostar Pen Inject 20 Units into the skin daily. 09/21/17   Argentina Donovan, PA-C  lisinopril (PRINIVIL,ZESTRIL) 10 MG tablet Take 1 tablet (10 mg total) by mouth daily. Kidney protection 12/13/16   Boykin Nearing, MD  meloxicam (MOBIC) 7.5 MG tablet Take 1 tablet (7.5 mg total) by mouth 2 (two) times daily. 09/21/17   Argentina Donovan, PA-C  metFORMIN (GLUCOPHAGE) 1000 MG tablet Take 1 tablet (1,000 mg total) by  mouth 2 (two) times daily with a meal. 12/13/16   Funches, Josalyn, MD  pravastatin (PRAVACHOL) 20 MG tablet Take 1 tablet (20 mg total) by mouth daily. 12/13/16   Funches, Adriana Mccallum, MD     Objective:  EXAM:   Vitals:   09/21/17 0952  BP: 127/80  Pulse: 75  Resp: 18  Temp: 98.6 F (37 C)  TempSrc: Oral  SpO2: 96%  Weight: 206 lb (93.4 kg)  Height: 6' (1.829 m)    General appearance : A&OX3. NAD. Non-toxic-appearing HEENT: Atraumatic and Normocephalic.  PERRLA. EOM intact.   Neck: supple, no JVD. No cervical lymphadenopathy. No thyromegaly Chest/Lungs:  Breathing-non-labored,  Good air entry bilaterally, breath sounds normal without rales, rhonchi, or wheezing  CVS: S1 S2 regular, no murmurs, gallops, rubs  Back:  ROM 90% of normal, neg SLR B.  +paraspinus spasm B that is TTP.  DTR =B.   Extremities: Bilateral Lower Ext shows no edema, both legs are warm to touch with = pulse throughout Neurology:  CN II-XII grossly intact, Non focal.   Psych:  TP scattered at times. J/I limited. Normal speech. Appropriate eye contact and affect.  Skin:  No Rash  Data Review Lab Results  Component Value Date   HGBA1C 12.3 09/21/2017   HGBA1C 9.2 12/13/2016   HGBA1C 6.4 04/19/2016     Assessment & Plan   1. Low back pain with sciatica, sciatica laterality unspecified, unspecified back pain laterality, unspecified chronicity No red flags.  Increase doe of meloxicam- meloxicam (MOBIC) 7.5 MG tablet; Take 1 tablet (7.5 mg total) by mouth 2 (two) times daily.  Dispense: 60 tablet; Refill: 0 add- cyclobenzaprine (FLEXERIL) 5 MG tablet; Take 1 tablet (5 mg total) by mouth at bedtime.  Dispense: 30 tablet; Refill: 1 RF- lidocaine (LIDODERM) 5 %; Place 1 patch onto the skin daily. Remove & Discard patch within 12 hours or as directed by MD  Dispense: 30 patch; Refill: 3  2. Uncontrolled type 2 diabetes mellitus with hyperglycemia ([place]) Do not use other people's meds.  Stressed this at length.  Spent more than 30 mins face to face and counseled on proper diet, no alcohol, exercise, compliance with medications.  Take metformin as directed - Glucose (CBG) - HgB A1c Increase dose- Insulin Glargine (LANTUS) 100 UNIT/ML Solostar Pen; Inject 20 Units into the skin daily.  Dispense: 15 mL; Refill: 11 - Comprehensive metabolic panel - CBC with Differential/Platelet - Lipid Panel  3. Uncircumcised male - Ambulatory referral to General Surgery  4. Hypertension, unspecified type Controlled-continue current regimen - Lipid Panel   Patient have been counseled extensively about nutrition  and exercise  Return in about 3 weeks (around 10/12/2017) for assign PCP; follow uncontrolled blood sugars.  The patient was given clear instructions to go to ER or return to medical center if symptoms don't improve, worsen or new problems develop. The patient verbalized understanding. The patient was told to call to get lab results if they haven't heard anything in the next week.     Freeman Caldron, PA-C [facility] [place], [place]   09/21/2017, 10:02 AM

## 2017-09-22 ENCOUNTER — Telehealth: Payer: Self-pay | Admitting: Family Medicine

## 2017-09-22 ENCOUNTER — Other Ambulatory Visit: Payer: Self-pay | Admitting: Physician Assistant

## 2017-09-22 DIAGNOSIS — M5440 Lumbago with sciatica, unspecified side: Secondary | ICD-10-CM

## 2017-09-22 LAB — COMPREHENSIVE METABOLIC PANEL
ALK PHOS: 126 IU/L — AB (ref 39–117)
ALT: 26 IU/L (ref 0–44)
AST: 15 IU/L (ref 0–40)
Albumin/Globulin Ratio: 1.8 (ref 1.2–2.2)
Albumin: 4.4 g/dL (ref 3.6–4.8)
BILIRUBIN TOTAL: 0.3 mg/dL (ref 0.0–1.2)
BUN / CREAT RATIO: 33 — AB (ref 10–24)
BUN: 26 mg/dL (ref 8–27)
CHLORIDE: 98 mmol/L (ref 96–106)
CO2: 20 mmol/L (ref 20–29)
CREATININE: 0.79 mg/dL (ref 0.76–1.27)
Calcium: 9.7 mg/dL (ref 8.6–10.2)
GFR calc Af Amer: 113 mL/min/{1.73_m2} (ref >59)
GFR calc non Af Amer: 98 mL/min/{1.73_m2} (ref >59)
GLUCOSE: 307 mg/dL — AB (ref 65–99)
Globulin, Total: 2.5 g/dL (ref 1.5–4.5)
Potassium: 4.8 mmol/L (ref 3.5–5.2)
Sodium: 138 mmol/L (ref 134–144)
Total Protein: 6.9 g/dL (ref 6.0–8.5)

## 2017-09-22 LAB — CBC WITH DIFFERENTIAL/PLATELET
BASOS ABS: 0 10*3/uL (ref 0.0–0.2)
Basos: 0 %
EOS (ABSOLUTE): 0.1 10*3/uL (ref 0.0–0.4)
EOS: 2 %
HEMATOCRIT: 54.3 % — AB (ref 37.5–51.0)
HEMOGLOBIN: 17.7 g/dL (ref 13.0–17.7)
IMMATURE GRANULOCYTES: 1 %
Immature Grans (Abs): 0.1 10*3/uL (ref 0.0–0.1)
LYMPHS ABS: 1.8 10*3/uL (ref 0.7–3.1)
Lymphs: 23 %
MCH: 28.9 pg (ref 26.6–33.0)
MCHC: 32.6 g/dL (ref 31.5–35.7)
MCV: 89 fL (ref 79–97)
MONOCYTES: 8 %
MONOS ABS: 0.7 10*3/uL (ref 0.1–0.9)
NEUTROS PCT: 66 %
Neutrophils Absolute: 5.4 10*3/uL (ref 1.4–7.0)
Platelets: 187 10*3/uL (ref 150–379)
RBC: 6.13 x10E6/uL — ABNORMAL HIGH (ref 4.14–5.80)
RDW: 14.2 % (ref 12.3–15.4)
WBC: 8.1 10*3/uL (ref 3.4–10.8)

## 2017-09-22 LAB — LIPID PANEL
CHOLESTEROL TOTAL: 202 mg/dL — AB (ref 100–199)
Chol/HDL Ratio: 5.5 ratio — ABNORMAL HIGH (ref 0.0–5.0)
HDL: 37 mg/dL — AB (ref >39)
TRIGLYCERIDES: 487 mg/dL — AB (ref 0–149)

## 2017-09-22 NOTE — Telephone Encounter (Signed)
Received fax from [facility], fax will in the pcp in-box

## 2017-09-22 NOTE — Telephone Encounter (Signed)
They do have the prescription - a prior authorization just needed to be completed which was done yesterday. Still pending by [facility] - can take 2-3 business days for approval/refusal.

## 2017-09-22 NOTE — Telephone Encounter (Signed)
-----   Message from Argentina Donovan, [place] sent at 09/22/2017  9:16 AM EDT ----- Please call patient.  Triglycerides are very high.  He should start taking 3 grams of fish oil daily and continue to work on diabetic diet and blood sugar control to help this improve.  His other labs are ok.  Check sugars as directed and keep blood sugar log and Follow-up in 3 weeks as planned. Thanks, Freeman Caldron, PA-C

## 2017-09-22 NOTE — Telephone Encounter (Signed)
Patient verified DOB Patient is aware of triglycerides being high and needing to take 3 grams of fish oil daily. Patient advised to follow up as planned and to check DM as directed. Patient is aware of [facility] fax being received and needing to be completed by a PCP [place].

## 2017-09-25 NOTE — Telephone Encounter (Signed)
Received fax from [facility] - lidocaine was denied. Will send in appeal

## 2017-09-28 NOTE — Telephone Encounter (Signed)
Lidocaine PA denied - discussed with Levada Dy who ordered it and she wants patient to just continue on his other medications and he can follow up with Dr. Margarita Rana at next appt. Called patient and he verbalized understanding.

## 2017-10-16 ENCOUNTER — Encounter: Payer: Self-pay | Admitting: Family Medicine

## 2017-10-16 ENCOUNTER — Ambulatory Visit: Payer: Medicare Other | Attending: Family Medicine | Admitting: Family Medicine

## 2017-10-16 VITALS — BP 117/66 | HR 71 | Temp 97.3°F | Ht 72.0 in | Wt 206.0 lb

## 2017-10-16 DIAGNOSIS — Z9889 Other specified postprocedural states: Secondary | ICD-10-CM | POA: Diagnosis not present

## 2017-10-16 DIAGNOSIS — F1290 Cannabis use, unspecified, uncomplicated: Secondary | ICD-10-CM | POA: Insufficient documentation

## 2017-10-16 DIAGNOSIS — G8929 Other chronic pain: Secondary | ICD-10-CM | POA: Insufficient documentation

## 2017-10-16 DIAGNOSIS — Z79899 Other long term (current) drug therapy: Secondary | ICD-10-CM | POA: Diagnosis not present

## 2017-10-16 DIAGNOSIS — R809 Proteinuria, unspecified: Secondary | ICD-10-CM | POA: Diagnosis not present

## 2017-10-16 DIAGNOSIS — Z791 Long term (current) use of non-steroidal anti-inflammatories (NSAID): Secondary | ICD-10-CM | POA: Diagnosis not present

## 2017-10-16 DIAGNOSIS — E7800 Pure hypercholesterolemia, unspecified: Secondary | ICD-10-CM | POA: Insufficient documentation

## 2017-10-16 DIAGNOSIS — M5442 Lumbago with sciatica, left side: Secondary | ICD-10-CM | POA: Diagnosis not present

## 2017-10-16 DIAGNOSIS — E1165 Type 2 diabetes mellitus with hyperglycemia: Secondary | ICD-10-CM | POA: Insufficient documentation

## 2017-10-16 DIAGNOSIS — M5441 Lumbago with sciatica, right side: Secondary | ICD-10-CM | POA: Diagnosis not present

## 2017-10-16 DIAGNOSIS — Z794 Long term (current) use of insulin: Secondary | ICD-10-CM | POA: Diagnosis not present

## 2017-10-16 DIAGNOSIS — I10 Essential (primary) hypertension: Secondary | ICD-10-CM | POA: Diagnosis not present

## 2017-10-16 DIAGNOSIS — E1129 Type 2 diabetes mellitus with other diabetic kidney complication: Secondary | ICD-10-CM

## 2017-10-16 DIAGNOSIS — L219 Seborrheic dermatitis, unspecified: Secondary | ICD-10-CM | POA: Diagnosis not present

## 2017-10-16 LAB — GLUCOSE, POCT (MANUAL RESULT ENTRY): POC Glucose: 224 mg/dl — AB (ref 70–99)

## 2017-10-16 MED ORDER — ATORVASTATIN CALCIUM 20 MG PO TABS: 20.0000 mg | ORAL_TABLET | Freq: Every day | ORAL | 3 refills | Status: DC

## 2017-10-16 MED ORDER — KETOCONAZOLE 2 % EX SHAM: 1.0000 "application " | MEDICATED_SHAMPOO | CUTANEOUS | 1 refills | Status: DC

## 2017-10-16 MED ORDER — LISINOPRIL 2.5 MG PO TABS: 2.5000 mg | ORAL_TABLET | Freq: Every day | ORAL | 3 refills | Status: DC

## 2017-10-16 MED ORDER — INSULIN GLARGINE 100 UNIT/ML SOLOSTAR PEN: 25.0000 [IU] | PEN_INJECTOR | Freq: Every day | SUBCUTANEOUS | 3 refills | Status: DC

## 2017-10-16 MED ORDER — GABAPENTIN 300 MG PO CAPS: 300.0000 mg | ORAL_CAPSULE | Freq: Two times a day (BID) | ORAL | 3 refills | Status: DC

## 2017-10-16 MED ORDER — METFORMIN HCL 1000 MG PO TABS: 1000.0000 mg | ORAL_TABLET | Freq: Two times a day (BID) | ORAL | 5 refills | Status: DC

## 2017-10-16 MED ORDER — MELOXICAM 7.5 MG PO TABS: 7.5000 mg | ORAL_TABLET | Freq: Every day | ORAL | 3 refills | Status: DC

## 2017-10-16 MED ORDER — TIZANIDINE HCL 4 MG PO TABS: 4.0000 mg | ORAL_TABLET | Freq: Three times a day (TID) | ORAL | 1 refills | Status: DC | PRN

## 2017-10-16 MED FILL — LISINOPRIL 2.5 MG TABLET: 2.5 | 30 days supply | Qty: 30 | Fill #0

## 2017-10-16 MED FILL — tiZANidine HCL 4 MG TABS: 4 | 30 days supply | Qty: 90 | Fill #0

## 2017-10-16 MED FILL — MELOXICAM 7.5 MG TABLET: 7.5 | 30 days supply | Qty: 30 | Fill #0

## 2017-10-16 MED FILL — KETOCONAZOLE 2% SHAMPOO: 2 | 30 days supply | Qty: 120 | Fill #0

## 2017-10-16 MED FILL — GABAPENTIN 300 MG CAPSULE: 300 | 30 days supply | Qty: 60 | Fill #0

## 2017-10-16 MED FILL — ATORVASTATIN 20 MG TABLET: 20 | 30 days supply | Qty: 30 | Fill #0

## 2017-10-16 NOTE — Patient Instructions (Signed)

## 2017-10-16 NOTE — Progress Notes (Signed)
Subjective:  Patient ID: Ryan Farmer, male    DOB: Nov 13, 1955  Age: 62 y.o. MRN: [id number]  CC: Diabetes   HPI JOSEPH BIAS is a 62 year old male with a history of type 2 diabetes mellitus (A1c 12.3), hypertension, hyperlipidemia who presents today to establish care with me. He reports fasting sugar was 145 this morning and denies hypoglycemia or numbness in extremities. Doing well on his antihypertensive and his statin and denies adverse effects. Requests a refill of ketoconazole shampoo for itching in his scalp.  He was seen by the physician assistant at his last visit for chronic low back pain and states symptoms have not improved with meloxicam.  Pain occurs in lower back and bilateral buttock cheek and radiates down both lower extremities, is increased with standing but denies numbness in his legs.  He endorses a history of "pot" use but this is recreational he states. He was seen by chiropractor last month with no relief in symptoms; he informs me he had a lumbar x-ray which revealed he had spurs in his back. He denies loss of sphincteric function.  Past Medical History:  Diagnosis Date  . Arthritis   . Diabetes mellitus   . Shingles     Past Surgical History:  Procedure Laterality Date  . knee surgrery      Family History  Problem Relation Age of Onset  . Cancer Mother   . Stroke Father   . Heart disease Father   . Diabetes Father     No Known Allergies   Outpatient Medications Prior to Visit  Medication Sig Dispense Refill  . Insulin Glargine (LANTUS) 100 UNIT/ML Solostar Pen Inject 20 Units into the skin daily. 15 mL 11  . meloxicam (MOBIC) 7.5 MG tablet Take 1 tablet (7.5 mg total) by mouth 2 (two) times daily. 60 tablet 0  . metFORMIN (GLUCOPHAGE) 1000 MG tablet Take 1 tablet (1,000 mg total) by mouth 2 (two) times daily with a meal. 60 tablet 5  . ACCU-CHEK SOFTCLIX LANCETS lancets 1 each by Other route 3 (three) times daily. (Patient not taking:  Reported on 10/16/2017) 100 each 12  . Blood Glucose Monitoring Suppl (ACCU-CHEK AVIVA PLUS) w/Device KIT 1 Device by Does not apply route 3 (three) times daily after meals. (Patient not taking: Reported on 10/16/2017) 1 kit 0  . glucose blood (ACCU-CHEK AVIVA PLUS) test strip 1 each by Other route 3 (three) times daily. (Patient not taking: Reported on 10/16/2017) 100 each 12  . hydrocortisone cream 1 % Apply 1 application topically 2 (two) times daily. (Patient not taking: Reported on 10/16/2017) 120 g 1  . Insulin Pen Needle (B-D ULTRAFINE III SHORT PEN) 31G X 8 MM MISC 1 application by Does not apply route daily. (Patient not taking: Reported on 10/16/2017) 100 each 3  . lidocaine (LIDODERM) 5 % Place 1 patch onto the skin daily. Remove & Discard patch within 12 hours or as directed by MD (Patient not taking: Reported on 10/16/2017) 30 patch 3  . cyclobenzaprine (FLEXERIL) 5 MG tablet Take 1 tablet (5 mg total) by mouth at bedtime. (Patient not taking: Reported on 10/16/2017) 30 tablet 1  . gemfibrozil (LOPID) 600 MG tablet Take 1 tablet (600 mg total) by mouth 2 (two) times daily before a meal. (Patient not taking: Reported on 10/16/2017) 60 tablet 5  . glipiZIDE (GLUCOTROL) 10 MG tablet TAKE 2 TABLETS(20 MG) BY MOUTH TWICE DAILY BEFORE A MEAL (Patient not taking: Reported on 10/16/2017) 60 tablet 5  .  lisinopril (PRINIVIL,ZESTRIL) 10 MG tablet Take 1 tablet (10 mg total) by mouth daily. Kidney protection (Patient not taking: Reported on 10/16/2017) 30 tablet 5  . pravastatin (PRAVACHOL) 20 MG tablet Take 1 tablet (20 mg total) by mouth daily. (Patient not taking: Reported on 10/16/2017) 90 tablet 3   No facility-administered medications prior to visit.     ROS Review of Systems  Constitutional: Negative for activity change and appetite change.  HENT: Negative for sinus pressure and sore throat.   Eyes: Negative for visual disturbance.  Respiratory: Negative for cough, chest tightness and shortness of breath.     Cardiovascular: Negative for chest pain and leg swelling.  Gastrointestinal: Negative for abdominal distention, abdominal pain, constipation and diarrhea.  Endocrine: Negative.   Genitourinary: Negative for dysuria.  Musculoskeletal:       See hpi  Skin: Negative for rash.  Allergic/Immunologic: Negative.   Neurological: Negative for weakness, light-headedness and numbness.  Psychiatric/Behavioral: Negative for dysphoric mood and suicidal ideas.    Objective:  BP 117/66   Pulse 71   Temp (!) 97.3 F (36.3 C) (Oral)   Ht 6' (1.829 m)   Wt 206 lb (93.4 kg)   SpO2 97%   BMI 27.94 kg/m   BP/Weight 10/16/2017 7/85/8850 08/17/7410  Systolic BP 878 676 720  Diastolic BP 66 80 97  Wt. (Lbs) 206 206 200  BMI 27.94 27.94 27.12      Physical Exam  Constitutional: He is oriented to person, place, and time. He appears well-developed and well-nourished.  Cardiovascular: Normal rate, normal heart sounds and intact distal pulses.  No murmur heard. Pulmonary/Chest: Effort normal and breath sounds normal. He has no wheezes. He has no rales. He exhibits no tenderness.  Abdominal: Soft. Bowel sounds are normal. He exhibits no distension and no mass. There is no tenderness.  Musculoskeletal:  Positive straight leg raise bilaterally  Neurological: He is alert and oriented to person, place, and time.  Skin: Skin is dry.  Psychiatric: He has a normal mood and affect.     CMP Latest Ref Rng & Units 09/21/2017 08/18/2017 12/13/2016  Glucose 65 - 99 mg/dL 307(H) 348(H) 165(H)  BUN 8 - 27 mg/dL 26 20 31(H)  Creatinine 0.76 - 1.27 mg/dL 0.79 0.91 0.97  Sodium 134 - 144 mmol/L 138 134(L) 142  Potassium 3.5 - 5.2 mmol/L 4.8 4.8 4.8  Chloride 96 - 106 mmol/L 98 101 105  CO2 20 - 29 mmol/L 20 20(L) 19  Calcium 8.6 - 10.2 mg/dL 9.7 10.0 9.2  Total Protein 6.0 - 8.5 g/dL 6.9 - -  Total Bilirubin 0.0 - 1.2 mg/dL 0.3 - -  Alkaline Phos 39 - 117 IU/L 126(H) - -  AST 0 - 40 IU/L 15 - -  ALT 0 - 44 IU/L  26 - -    Lipid Panel     Component Value Date/Time   CHOL 202 (H) 09/21/2017 1050   TRIG 487 (H) 09/21/2017 1050   HDL 37 (L) 09/21/2017 1050   CHOLHDL 5.5 (H) 09/21/2017 1050   CHOLHDL 6.0 (H) 09/03/2015 1700   VLDL 55 (H) 09/03/2015 1700   LDLCALC Comment 09/21/2017 1050    Lab Results  Component Value Date   HGBA1C 12.3 09/21/2017    Assessment & Plan:   1. Uncontrolled type 2 diabetes mellitus with hyperglycemia (HCC) Uncontrolled with A1c of 12.3 Blood sugar log reveals improvement Increase Lantus from 20 units to 25 units - POCT glucose (manual entry) - Insulin Glargine (  LANTUS) 100 UNIT/ML Solostar Pen; Inject 25 Units into the skin daily.  Dispense: 15 mL; Refill: 3  2. Type 2 diabetes mellitus with microalbuminuria, with long-term current use of insulin (HCC) - metFORMIN (GLUCOPHAGE) 1000 MG tablet; Take 1 tablet (1,000 mg total) by mouth 2 (two) times daily with a meal.  Dispense: 60 tablet; Refill: 5  3. Bilateral low back pain with bilateral sciatica, unspecified chronicity Tizanidine and gabapentin added to regimen Advised to apply heat We will not prescribe narcotics given ongoing nutritional drug use Advised that he might have to be referred to pain management if current regimen does not help - meloxicam (MOBIC) 7.5 MG tablet; Take 1 tablet (7.5 mg total) by mouth daily.  Dispense: 30 tablet; Refill: 3 - tiZANidine (ZANAFLEX) 4 MG tablet; Take 1 tablet (4 mg total) by mouth every 8 (eight) hours as needed for muscle spasms.  Dispense: 90 tablet; Refill: 1 - gabapentin (NEURONTIN) 300 MG capsule; Take 1 capsule (300 mg total) by mouth 2 (two) times daily.  Dispense: 60 capsule; Refill: 3  4. Essential hypertension Controlled Low-sodium diet - lisinopril (PRINIVIL,ZESTRIL) 2.5 MG tablet; Take 1 tablet (2.5 mg total) by mouth daily. Kidney protection  Dispense: 30 tablet; Refill: 3  5. Seborrheic dermatitis - ketoconazole (NIZORAL) 2 % shampoo; Apply 1  application topically 2 (two) times a week.  Dispense: 120 mL; Refill: 1  6. Pure hypercholesterolemia Controlled Low-cholesterol diet, lifestyle modifications - atorvastatin (LIPITOR) 20 MG tablet; Take 1 tablet (20 mg total) by mouth daily.  Dispense: 30 tablet; Refill: 3   Meds ordered this encounter  Medications  . Insulin Glargine (LANTUS) 100 UNIT/ML Solostar Pen    Sig: Inject 25 Units into the skin daily.    Dispense:  15 mL    Refill:  3  . metFORMIN (GLUCOPHAGE) 1000 MG tablet    Sig: Take 1 tablet (1,000 mg total) by mouth 2 (two) times daily with a meal.    Dispense:  60 tablet    Refill:  5  . meloxicam (MOBIC) 7.5 MG tablet    Sig: Take 1 tablet (7.5 mg total) by mouth daily.    Dispense:  30 tablet    Refill:  3  . lisinopril (PRINIVIL,ZESTRIL) 2.5 MG tablet    Sig: Take 1 tablet (2.5 mg total) by mouth daily. Kidney protection    Dispense:  30 tablet    Refill:  3  . tiZANidine (ZANAFLEX) 4 MG tablet    Sig: Take 1 tablet (4 mg total) by mouth every 8 (eight) hours as needed for muscle spasms.    Dispense:  90 tablet    Refill:  1  . ketoconazole (NIZORAL) 2 % shampoo    Sig: Apply 1 application topically 2 (two) times a week.    Dispense:  120 mL    Refill:  1  . gabapentin (NEURONTIN) 300 MG capsule    Sig: Take 1 capsule (300 mg total) by mouth 2 (two) times daily.    Dispense:  60 capsule    Refill:  3  . atorvastatin (LIPITOR) 20 MG tablet    Sig: Take 1 tablet (20 mg total) by mouth daily.    Dispense:  30 tablet    Refill:  3    Follow-up: Return in about 3 months (around 01/15/2018) for Follow-up of chronic medical conditions.   Charlott Rakes MD

## 2017-10-17 LAB — MICROALBUMIN / CREATININE URINE RATIO
Creatinine, Urine: 201.7 mg/dL
Microalb/Creat Ratio: 30.3 mg/g creat — ABNORMAL HIGH (ref 0.0–30.0)
Microalbumin, Urine: 61.1 ug/mL

## 2017-11-04 ENCOUNTER — Other Ambulatory Visit: Payer: Self-pay

## 2017-11-04 ENCOUNTER — Encounter (HOSPITAL_COMMUNITY): Payer: Self-pay | Admitting: Emergency Medicine

## 2017-11-04 ENCOUNTER — Emergency Department (HOSPITAL_COMMUNITY)
Admission: EM | Admit: 2017-11-04 | Discharge: 2017-11-04 | Disposition: A | Discharge status: Home / Self Care | Payer: Medicare Other | Attending: Emergency Medicine | Admitting: Emergency Medicine

## 2017-11-04 ENCOUNTER — Emergency Department (HOSPITAL_COMMUNITY): Payer: Medicare Other

## 2017-11-04 DIAGNOSIS — Z79899 Other long term (current) drug therapy: Secondary | ICD-10-CM | POA: Diagnosis not present

## 2017-11-04 DIAGNOSIS — I10 Essential (primary) hypertension: Secondary | ICD-10-CM | POA: Insufficient documentation

## 2017-11-04 DIAGNOSIS — M25561 Pain in right knee: Secondary | ICD-10-CM | POA: Diagnosis not present

## 2017-11-04 DIAGNOSIS — E119 Type 2 diabetes mellitus without complications: Secondary | ICD-10-CM | POA: Diagnosis not present

## 2017-11-04 DIAGNOSIS — Z7984 Long term (current) use of oral hypoglycemic drugs: Secondary | ICD-10-CM | POA: Diagnosis not present

## 2017-11-04 DIAGNOSIS — M79604 Pain in right leg: Secondary | ICD-10-CM | POA: Insufficient documentation

## 2017-11-04 DIAGNOSIS — M79651 Pain in right thigh: Secondary | ICD-10-CM

## 2017-11-04 DIAGNOSIS — M25551 Pain in right hip: Secondary | ICD-10-CM | POA: Diagnosis not present

## 2017-11-04 DIAGNOSIS — F17210 Nicotine dependence, cigarettes, uncomplicated: Secondary | ICD-10-CM | POA: Insufficient documentation

## 2017-11-04 NOTE — ED Triage Notes (Signed)
PT reports he takes the  meds given to him  For pain with out relief. Pt has also tried heat and a ace wrap to RT leg

## 2017-11-04 NOTE — ED Provider Notes (Signed)
[place] [facility] EMERGENCY DEPARTMENT Provider Note   CSN: [id number] Arrival date & time: 11/04/17  1006     History   Chief Complaint Chief Complaint  Patient presents with  . Leg Pain  . Back Pain    HPI Ryan Farmer is a 62 y.o. male who presents the emergency department with chief complaint of right leg pain.  He has a past medical history of chronic low back pain and bilateral sciatica.  Patient states that the pain he is experiencing is new.  He complains of pain in the right thigh that radiates into the knee.  He has no pain at rest but has severe pain whenever he applies weight to the leg.  He denies numbness or tingling.  The pain seems to be in the front of the thigh and radiates just past the knee on the right.  She denies any symptoms of claudication, weakness.  HPI  Past Medical History:  Diagnosis Date  . Arthritis   . Diabetes mellitus   . Shingles     Patient Active Problem List   Diagnosis Date Noted  . Cocaine abuse ([place]) 12/19/2016  . Shingles outbreak 12/13/2016  . Diabetes type 2, uncontrolled ([place]) 04/19/2016  . Metatarsalgia 10/19/2015  . Back muscle spasm 11/18/2013  . Hyperlipidemia 11/18/2013  . HTN (hypertension) 11/22/2012  . Chronic pain syndrome 11/22/2012  . Tobacco use disorder 11/22/2012  . Dental caries 11/22/2012    Past Surgical History:  Procedure Laterality Date  . knee surgrery          Home Medications    Prior to Admission medications   Medication Sig Start Date End Date Taking? Authorizing Provider  ACCU-CHEK SOFTCLIX LANCETS lancets 1 each by Other route 3 (three) times daily. Patient not taking: Reported on 10/16/2017 12/13/16   Boykin Nearing, MD  atorvastatin (LIPITOR) 20 MG tablet Take 1 tablet (20 mg total) by mouth daily. 10/16/17   Charlott Rakes, MD  Blood Glucose Monitoring Suppl (ACCU-CHEK AVIVA PLUS) w/Device KIT 1 Device by Does not apply route 3 (three) times daily after meals. Patient not  taking: Reported on 10/16/2017 12/13/16   Boykin Nearing, MD  gabapentin (NEURONTIN) 300 MG capsule Take 1 capsule (300 mg total) by mouth 2 (two) times daily. 10/16/17   Charlott Rakes, MD  glucose blood (ACCU-CHEK AVIVA PLUS) test strip 1 each by Other route 3 (three) times daily. Patient not taking: Reported on 10/16/2017 12/13/16   Boykin Nearing, MD  hydrocortisone cream 1 % Apply 1 application topically 2 (two) times daily. Patient not taking: Reported on 10/16/2017 04/19/16   Boykin Nearing, MD  Insulin Glargine (LANTUS) 100 UNIT/ML Solostar Pen Inject 25 Units into the skin daily. 10/16/17   Charlott Rakes, MD  Insulin Pen Needle (B-D ULTRAFINE III SHORT PEN) 31G X 8 MM MISC 1 application by Does not apply route daily. Patient not taking: Reported on 10/16/2017 12/13/16   Boykin Nearing, MD  ketoconazole (NIZORAL) 2 % shampoo Apply 1 application topically 2 (two) times a week. 10/16/17   Charlott Rakes, MD  lidocaine (LIDODERM) 5 % Place 1 patch onto the skin daily. Remove & Discard patch within 12 hours or as directed by MD Patient not taking: Reported on 10/16/2017 09/21/17   Argentina Donovan, PA-C  lisinopril (PRINIVIL,ZESTRIL) 2.5 MG tablet Take 1 tablet (2.5 mg total) by mouth daily. Kidney protection 10/16/17   Charlott Rakes, MD  meloxicam (MOBIC) 7.5 MG tablet Take 1 tablet (7.5 mg total)  by mouth daily. 10/16/17   Charlott Rakes, MD  metFORMIN (GLUCOPHAGE) 1000 MG tablet Take 1 tablet (1,000 mg total) by mouth 2 (two) times daily with a meal. 10/16/17   Charlott Rakes, MD  tiZANidine (ZANAFLEX) 4 MG tablet Take 1 tablet (4 mg total) by mouth every 8 (eight) hours as needed for muscle spasms. 10/16/17   Charlott Rakes, MD    Family History Family History  Problem Relation Age of Onset  . Cancer Mother   . Stroke Father   . Heart disease Father   . Diabetes Father     Social History Social History   Tobacco Use  . Smoking status: Current Every Day Smoker    Packs/day: 0.50    Types:  Cigarettes  . Smokeless tobacco: Never Used  Substance Use Topics  . Alcohol use: No    Comment: occ  . Drug use: Yes    Types: Marijuana     Allergies   Patient has no known allergies.   Review of Systems Review of Systems  Ten systems reviewed and are negative for acute change, except as noted in the HPI.    Physical Exam Updated Vital Signs BP (!) 144/94 (BP Location: Left Arm)   Pulse 73   Temp 97.8 F (36.6 C) (Oral)   Resp 18   SpO2 98%   Physical Exam  Constitutional: He appears well-developed and well-nourished. No distress.  HENT:  Head: Normocephalic and atraumatic.  Eyes: Conjunctivae are normal. No scleral icterus.  Neck: Normal range of motion. Neck supple.  Cardiovascular: Normal rate, regular rhythm and normal heart sounds.  Pulmonary/Chest: Effort normal and breath sounds normal. No respiratory distress.  Abdominal: Soft. There is no tenderness.  Musculoskeletal: He exhibits no edema.  Full passive range of motion of the knee and hip without pain on the right.  Negative straight leg test bilaterally, 1+ patellar reflexes bilaterally.  Antalgic gait with ambulation only  Neurological: He is alert.  Skin: Skin is warm and dry. He is not diaphoretic.  Psychiatric: His behavior is normal.  Nursing note and vitals reviewed.    ED Treatments / Results  Labs (all labs ordered are listed, but only abnormal results are displayed) Labs Reviewed - No data to display  EKG None  Radiology No results found.  Procedures Procedures (including critical care time)  Medications Ordered in ED Medications - No data to display   Initial Impression / Assessment and Plan / ED Course  I have reviewed the triage vital signs and the nursing notes.  Pertinent labs & imaging results that were available during my care of the patient were reviewed by me and considered in my medical decision making (see chart for details).     Patient with negative x-ray films  of the hip and knee for acute injury.  There does not appear to be some patellar arthritis on the right.  No symptoms of claudication, no weakness or other signs of radiculopathy.  Patient given crutches and outpatient follow-up.  Normal pulses in the leg and I discussed return precautions.  He appears appropriate for discharge at this time.  Seen in shared visit with Dr. Ellender Hose who agrees with work-up and plan of care.  Final Clinical Impressions(s) / ED Diagnoses   Final diagnoses:  None    ED Discharge Orders    None       Margarita Mail, PA-C 11/04/17 1443    Duffy Bruce, MD 11/04/17 [phone]

## 2017-11-04 NOTE — Progress Notes (Signed)
Orthopedic Tech Progress Note Patient Details:  Riaz Onorato [facility] Sep 26, 1955 [id number]  Ortho Devices Type of Ortho Device: Crutches Ortho Device/Splint Interventions: Application   Post Interventions Patient Tolerated: Well Instructions Provided: Care of device   Hildred Priest 11/04/2017, 2:27 PM

## 2017-11-04 NOTE — ED Triage Notes (Signed)
Pt. Stated, I started having back pain a couple months ago and now its on my right leg, I have spinal stenosis , I went to to primary care doctor and she gave my medicine and nothing is helping.

## 2017-11-04 NOTE — ED Notes (Signed)
Patient transported to X-ray 

## 2017-11-04 NOTE — Discharge Instructions (Addendum)
Musculoskeletal Pain Musculoskeletal pain is muscle and bone aches and pains. This pain can occur in any part of the body. Follow these instructions at home: Only take medicines for pain, discomfort, or fever as told by your health care provider. You may continue all activities unless the activities cause more pain. When the pain lessens, slowly resume normal activities. Gradually increase the intensity and duration of the activities or exercise. During periods of severe pain, bed rest may be helpful. Lie or sit in any position that is comfortable, but get out of bed and walk around at least every several hours. If directed, put ice on the injured area. Put ice in a plastic bag. Place a towel between your skin and the bag. Leave the ice on for 20 minutes, 2-3 times a day. Contact a health care provider if: Your pain is getting worse. Your pain is not relieved with medicines. You lose function in the area of the pain if the pain is in your arms, legs, or neck. This information is not intended to replace advice given to you by your health care provider. Make sure you discuss any questions you have with your health care provider.

## 2017-11-04 NOTE — ED Notes (Signed)
Declined W/C at D/C and was escorted to lobby by RN. 

## 2017-11-10 ENCOUNTER — Other Ambulatory Visit: Payer: Self-pay | Admitting: Physician Assistant

## 2017-11-10 ENCOUNTER — Other Ambulatory Visit: Payer: Self-pay | Admitting: *Deleted

## 2017-11-10 DIAGNOSIS — M5442 Lumbago with sciatica, left side: Principal | ICD-10-CM

## 2017-11-10 DIAGNOSIS — M5440 Lumbago with sciatica, unspecified side: Secondary | ICD-10-CM

## 2017-11-10 DIAGNOSIS — M5441 Lumbago with sciatica, right side: Secondary | ICD-10-CM

## 2017-11-13 ENCOUNTER — Other Ambulatory Visit: Payer: Self-pay

## 2017-11-13 ENCOUNTER — Other Ambulatory Visit: Payer: Self-pay | Admitting: Family Medicine

## 2017-11-13 DIAGNOSIS — M5440 Lumbago with sciatica, unspecified side: Secondary | ICD-10-CM

## 2017-11-13 MED ORDER — MELOXICAM 7.5 MG PO TABS: 7.5000 mg | ORAL_TABLET | Freq: Two times a day (BID) | ORAL | 0 refills | Status: DC

## 2017-11-29 ENCOUNTER — Telehealth: Payer: Self-pay | Admitting: Family Medicine

## 2017-11-29 DIAGNOSIS — M5442 Lumbago with sciatica, left side: Principal | ICD-10-CM

## 2017-11-29 DIAGNOSIS — M5441 Lumbago with sciatica, right side: Secondary | ICD-10-CM

## 2017-11-29 MED ORDER — TIZANIDINE HCL 4 MG PO TABS: 4.0000 mg | ORAL_TABLET | Freq: Three times a day (TID) | ORAL | 2 refills | Status: DC | PRN

## 2017-11-29 MED FILL — ATORVASTATIN 20 MG TABLET: 20 | 30 days supply | Qty: 30 | Fill #1 | Status: TO

## 2017-11-29 MED FILL — tiZANidine HCL 4 MG TABS: 4 | 30 days supply | Qty: 90 | Fill #1

## 2017-11-29 MED FILL — LISINOPRIL 2.5 MG TABLET: 2.5 | 30 days supply | Qty: 30 | Fill #1 | Status: TO

## 2017-11-29 MED FILL — KETOCONAZOLE 2% SHAMPOO: 2 | 30 days supply | Qty: 120 | Fill #1

## 2017-11-29 MED FILL — metFORMIN HCL 1000 MG TABS: 1000 | 30 days supply | Qty: 60 | Fill #0 | Status: TO

## 2017-11-29 MED FILL — GABAPENTIN 300 MG CAPSULE: 300 | 30 days supply | Qty: 60 | Fill #1 | Status: TO

## 2017-11-29 MED FILL — MELOXICAM 7.5 MG TABLET: 7.5 | 30 days supply | Qty: 30 | Fill #1 | Status: TO

## 2017-11-29 NOTE — Telephone Encounter (Signed)
He has 3 refills of his medications and will need to contact the pharmacy.  Additional refill for tizanidine has been sent to his pharmacy.

## 2017-11-29 NOTE — Telephone Encounter (Signed)
Pt came in to request a refill on all his medications, he has right leg pain and needs the medications, he denied a visit on the walkin schedule, please send medications to [facility]

## 2017-11-30 ENCOUNTER — Other Ambulatory Visit: Payer: Self-pay

## 2017-11-30 DIAGNOSIS — M5441 Lumbago with sciatica, right side: Secondary | ICD-10-CM

## 2017-11-30 DIAGNOSIS — M5442 Lumbago with sciatica, left side: Principal | ICD-10-CM

## 2017-11-30 MED ORDER — TIZANIDINE HCL 4 MG PO TABS: 4.0000 mg | ORAL_TABLET | Freq: Three times a day (TID) | ORAL | 2 refills | Status: DC | PRN

## 2017-12-05 ENCOUNTER — Ambulatory Visit (INDEPENDENT_AMBULATORY_CARE_PROVIDER_SITE_OTHER): Payer: Medicare Other | Admitting: Physical Medicine and Rehabilitation

## 2017-12-05 ENCOUNTER — Ambulatory Visit (INDEPENDENT_AMBULATORY_CARE_PROVIDER_SITE_OTHER): Payer: Medicare Other

## 2017-12-05 ENCOUNTER — Encounter (INDEPENDENT_AMBULATORY_CARE_PROVIDER_SITE_OTHER): Payer: Self-pay | Admitting: Physical Medicine and Rehabilitation

## 2017-12-05 VITALS — BP 123/76 | HR 82

## 2017-12-05 DIAGNOSIS — M5441 Lumbago with sciatica, right side: Secondary | ICD-10-CM | POA: Diagnosis not present

## 2017-12-05 DIAGNOSIS — E1165 Type 2 diabetes mellitus with hyperglycemia: Secondary | ICD-10-CM | POA: Diagnosis not present

## 2017-12-05 DIAGNOSIS — M62830 Muscle spasm of back: Secondary | ICD-10-CM

## 2017-12-05 DIAGNOSIS — G8929 Other chronic pain: Secondary | ICD-10-CM

## 2017-12-05 DIAGNOSIS — G894 Chronic pain syndrome: Secondary | ICD-10-CM | POA: Diagnosis not present

## 2017-12-05 NOTE — Progress Notes (Signed)
Numeric Pain Rating Scale and Functional Assessment Average Pain 10 Pain Right Now 10 My pain is constant, sharp, burning, stabbing and aching Pain is worse with: walking, bending, sitting and standing Pain improves with: rest, heat/ice and medication   In the last MONTH (on 0-10 scale) has pain interfered with the following?  1. General activity like being  able to carry out your everyday physical activities such as walking, climbing stairs, carrying groceries, or moving a chair?  Rating(10)  2. Relation with others like being able to carry out your usual social activities and roles such as  activities at home, at work and in your community. Rating(10)  3. Enjoyment of life such that you have  been bothered by emotional problems such as feeling anxious, depressed or irritable?  Rating(10)

## 2017-12-06 ENCOUNTER — Encounter (INDEPENDENT_AMBULATORY_CARE_PROVIDER_SITE_OTHER): Payer: Self-pay | Admitting: Physical Medicine and Rehabilitation

## 2017-12-06 NOTE — Progress Notes (Signed)
Ryan DELAPENA - 62 y.o. male MRN [id number]  Date of birth: Mar 30, 1956  Office Visit Note: Visit Date: 12/05/2017 PCP: Charlott Rakes, MD Referred by: Charlott Rakes, MD  Subjective: Chief Complaint  Patient presents with  . Lower Back - Pain  . Right Leg - Pain   HPI: [name] is a 62 year old gentleman that comes in today as a self-referral for what is basically chronic worsening low back and right hip and leg pain consistent with an L4 radiculitis and radiculopathy.  His primary care physician is Dr. Margarita Rana at [facility].  He tells me today that he actually met a patient of mine at the [place] who had similar complaints that he has had said that an injection that we performed helped him quite a bit.  He reports 3 months ago abrupt onset of pain radiating down the right leg across the knee and into the medial aspect of the calf.  He does not report any symptoms to the foot.  He does report a lot of spasming pain in the leg.  He denies specific paresthesia.  He reports never having had any pain like this before and is quite severe and so far no medications have helped him.  He is been placed on meloxicam but is also taking over-the-counter Aleve.  He has also been placed on gabapentin and is taking 300 mg twice per day.  He is also been taking Flexeril along with tizanidine.  He evidently is not able to really read or write and does look at his medications by the shape of the pill but does not really know what they are used for.  None of the medications seem to help but the muscle relaxers do help him get some sleep at night.  He has been using salon pause patches on the leg.  In fact today he must have 10 different patches on his right leg.  He reports that his pain is constant and he really cannot find a position to sit or lie in very well.  He does get more pain with ambulating but some pain at rest as well.  He reports having to walk with crutches to take  some of the weight off of his right leg.  He also reports that using cold water on his back and leg seems to help.  He actually did see a chiropractor for about 1 month and this did seem to relieve some of the pain that he was having in his left buttock but not the leg itself.  He denies any new focal weakness.  Again he has had no specific trauma.  He has had no fevers chills or night sweats.  He has had no unexplained weight loss recently although he has had some weight loss in the past.  His case is complicated by history of uncontrolled diabetes type 2 as well as really being noncompliant with medications.  In terms of his diabetes his last hemoglobin A1c was above 12.  He also has a remote history of cocaine abuse.  He has had no prior lumbar surgery or advanced imaging of the lumbar spine or at least that I can see on our chart any imaging of the lumbar spine in terms of x-ray.  X-rays of the lumbar spine were taken today and reviewed below and reviewed with the patient.  He does have a history of bilateral arthroscopic surgery on both knees.   Review of Systems  Constitutional: Negative  for chills, fever, malaise/fatigue and weight loss.  HENT: Negative for hearing loss and sinus pain.   Eyes: Negative for blurred vision, double vision and photophobia.  Respiratory: Negative for cough and shortness of breath.   Cardiovascular: Negative for chest pain, palpitations and leg swelling.  Gastrointestinal: Negative for abdominal pain, nausea and vomiting.  Genitourinary: Negative for flank pain.  Musculoskeletal: Positive for back pain and joint pain. Negative for myalgias.       Right radicular leg pain  Skin: Negative for itching and rash.  Neurological: Negative for tremors, focal weakness and weakness.  Endo/Heme/Allergies: Negative.   Psychiatric/Behavioral: Negative for depression.  All other systems reviewed and are negative.  Otherwise per HPI.  Assessment & Plan: Visit Diagnoses:  1.  Chronic bilateral low back pain with right-sided sciatica   2. Uncontrolled type 2 diabetes mellitus with hyperglycemia ([place])   3. Chronic pain syndrome   4. Back muscle spasm     Plan: Findings:  Chronic pain history and chronic back pain history but now with 3 months of severe right hip and leg pain with more pain in the buttock lateral thigh and across the knee medially and more of an L4 distribution but could be somewhat L5.  Chiropractic manipulation seem to help his buttock pain and anti-inflammatory medications and gabapentin have not really helped him that much.  He does have problems with uncontrolled diabetes and really noncompliance with medications.  He had seen a patient of mine who had received an epidural injection for stenosis and did very well.  I feel like his symptoms are consistent probably with stenosis or could be a small disc herniation.  He has no other red flag complaints from a spine or nerve standpoint.  He has good strength overall.  I do think it would be fine at this point to complete transforaminal epidural injection diagnostically and therapeutically.  I discussed with him the fact that it would raise his blood sugar temporarily and that he would have to monitor this.  Because of his diabetes is not something we could do very often but hopefully it would help him and give him at least some chance to feel better.  He keeps asking for other medications to try to help until we can get the injection completed.  He would be a horrible candidate for opioid management.  He has a history of cocaine abuse and marijuana use and he also has noncompliance with medications.  I do feel like if this needed to go further with medication management he would need to see a pain management physician that does more medication management.  It appears his primary care physicians have discussed this.  Depending on the relief with the injection if he does not get lasting relief or it does not help then  he really needs an MRI of the lumbar spine.  I do think it safe to do the injection one time without the MRI.  I did ask him to increase the gabapentin to 3 times per day.  He probably has some level of kidney dysfunction but the diabetes we would have to just monitor that as I see him back to see if he is getting sedated from the gabapentin.  We have gone over the fact that he should either take the meloxicam or the Aleve but not both.  We also went over the use of the Zanaflex as well as the Flexeril.    Meds & Orders: No orders of the defined types  were placed in this encounter.   Orders Placed This Encounter  Procedures  . XR Lumbar Spine Complete    Follow-up: Return for Right L4 and L5 transforaminal epidural steroid injection.   Procedures: No procedures performed  No notes on file   Clinical History: No specialty comments available.   He reports that he has been smoking cigarettes.  He has been smoking about 0.50 packs per day. He has never used smokeless tobacco.  Recent Labs    12/13/16 1415 09/21/17 0956  HGBA1C 9.2 12.3    Objective:  VS:  HT:    WT:   BMI:     BP:123/76  HR:82bpm  TEMP: ( )  RESP:  Physical Exam  Constitutional: He is oriented to person, place, and time. He appears well-developed and well-nourished. No distress.  HENT:  Head: Normocephalic and atraumatic.  Nose: Nose normal.  Mouth/Throat: Oropharynx is clear and moist.  Eyes: Pupils are equal, round, and reactive to light. Conjunctivae are normal.  Neck: Neck supple. No JVD present. No tracheal deviation present.  Cardiovascular: Regular rhythm and intact distal pulses.  Pulmonary/Chest: Effort normal and breath sounds normal.  Abdominal: Soft. He exhibits no distension. There is no rebound and no guarding.  Musculoskeletal: He exhibits no edema or deformity.  Patient ambulates with the use of crutches and he has an antalgic gait to the right.  No Trendelenburg gait.  No pain with hip  rotation bilaterally although he is somewhat stiff on the right hip compared to left.  He has no tenderness over the greater trochanters.  He has good strength in the lower extremities and symmetric.  Again good strength with EHL and dorsiflexion and plantarflexion.  He has no clonus.  Skin of the lower extremity shows no ulcerations.  He does have pain with extension of the lumbar spine and facet joint loading.  He has no paraspinal tenderness or indurations.  Neurological: He is alert and oriented to person, place, and time. He exhibits normal muscle tone. Coordination normal.  Skin: Skin is warm. No rash noted.  Psychiatric: He has a normal mood and affect. His behavior is normal.  Nursing note and vitals reviewed.   Ortho Exam Imaging: Xr Lumbar Spine Complete  Result Date: 12/06/2017 4 view lumbar spine x-rays show very mild right leaning scoliosis without any rotational component.  He has equal pelvic heights bilaterally.  Right hip joint shows some cam formation with mild FAI.  Left hip appears to have very minimal degenerative joint disease.  He has facet arthropathy at L4-5 and L5-S1 bilaterally with some bi-foraminal narrowing.  There is minimal listhesis of L3 on L4 anteriorly and perhaps some very minimal mild retrolisthesis of L4 on L5.  There is no spondylolysis or pars defects.  Disc heights are actually fairly well-maintained.  There are no fractures or dislocations.   Past Medical/Family/Surgical/Social History: Medications & Allergies reviewed per EMR, new medications updated. Patient Active Problem List   Diagnosis Date Noted  . Cocaine abuse ([place]) 12/19/2016  . Shingles outbreak 12/13/2016  . Diabetes type 2, uncontrolled ([place]) 04/19/2016  . Metatarsalgia 10/19/2015  . Back muscle spasm 11/18/2013  . Hyperlipidemia 11/18/2013  . HTN (hypertension) 11/22/2012  . Chronic pain syndrome 11/22/2012  . Tobacco use disorder 11/22/2012  . Dental caries 11/22/2012   Past  Medical History:  Diagnosis Date  . Arthritis   . Diabetes mellitus   . Shingles    Family History  Problem Relation Age of Onset  .  Cancer Mother   . Stroke Father   . Heart disease Father   . Diabetes Father    Past Surgical History:  Procedure Laterality Date  . knee surgrery     Social History   Occupational History  . Not on file  Tobacco Use  . Smoking status: Current Every Day Smoker    Packs/day: 0.50    Types: Cigarettes  . Smokeless tobacco: Never Used  Substance and Sexual Activity  . Alcohol use: No    Comment: occ  . Drug use: Yes    Types: Marijuana  . Sexual activity: Not on file

## 2017-12-12 ENCOUNTER — Encounter (INDEPENDENT_AMBULATORY_CARE_PROVIDER_SITE_OTHER): Payer: Self-pay | Admitting: Physical Medicine and Rehabilitation

## 2017-12-12 ENCOUNTER — Ambulatory Visit (INDEPENDENT_AMBULATORY_CARE_PROVIDER_SITE_OTHER): Payer: Medicare Other | Admitting: Physical Medicine and Rehabilitation

## 2017-12-12 ENCOUNTER — Ambulatory Visit (INDEPENDENT_AMBULATORY_CARE_PROVIDER_SITE_OTHER): Payer: Medicare Other

## 2017-12-12 VITALS — BP 135/68

## 2017-12-12 DIAGNOSIS — M5416 Radiculopathy, lumbar region: Secondary | ICD-10-CM | POA: Diagnosis not present

## 2017-12-12 MED ORDER — BETAMETHASONE SOD PHOS & ACET 6 (3-3) MG/ML IJ SUSP
12.0000 mg | Freq: Once | INTRAMUSCULAR | Status: AC
  Administered 2017-12-12: 12 mg

## 2017-12-12 NOTE — Progress Notes (Signed)
Numeric Pain Rating Scale and Functional Assessment Average Pain 10   In the last MONTH (on 0-10 scale) has pain interfered with the following?  1. General activity like being  able to carry out your everyday physical activities such as walking, climbing stairs, carrying groceries, or moving a chair?  Rating(10)   +Driver, -BT, -Dye Allergies. 

## 2017-12-12 NOTE — Procedures (Signed)
Lumbosacral Transforaminal Epidural Steroid Injection - Sub-Pedicular Approach with Fluoroscopic Guidance  Patient: Ryan Farmer      Date of Birth: Sep 20, 1955 MRN: [id number] PCP: Charlott Rakes, MD      Visit Date: 12/12/2017   Universal Protocol:    Date/Time: 12/12/2017  Consent Given By: the patient  Position: PRONE  Additional Comments: Vital signs were monitored before and after the procedure. Patient was prepped and draped in the usual sterile fashion. The correct patient, procedure, and site was verified.   Injection Procedure Details:  Procedure Site One Meds Administered:  Meds ordered this encounter  Medications  . betamethasone acetate-betamethasone sodium phosphate (CELESTONE) injection 12 mg    Laterality: Right  Location/Site:  L4-L5 L5-S1  Needle size: 22 G  Needle type: Spinal  Needle Placement: Transforaminal  Findings:    -Comments: Excellent flow of contrast along the nerve and into the epidural space.  Procedure Details: After squaring off the end-plates to get a true AP view, the C-arm was positioned so that an oblique view of the foramen as noted above was visualized. The target area is just inferior to the "nose of the scotty dog" or sub pedicular. The soft tissues overlying this structure were infiltrated with 2-3 ml. of 1% Lidocaine without Epinephrine.  The spinal needle was inserted toward the target using a "trajectory" view along the fluoroscope beam.  Under AP and lateral visualization, the needle was advanced so it did not puncture dura and was located close the 6 O'Clock position of the pedical in AP tracterory. Biplanar projections were used to confirm position. Aspiration was confirmed to be negative for CSF and/or blood. A 1-2 ml. volume of Isovue-250 was injected and flow of contrast was noted at each level. Radiographs were obtained for documentation purposes.   After attaining the desired flow of contrast documented above, a  0.5 to 1.0 ml test dose of 0.25% Marcaine was injected into each respective transforaminal space.  The patient was observed for 90 seconds post injection.  After no sensory deficits were reported, and normal lower extremity motor function was noted,   the above injectate was administered so that equal amounts of the injectate were placed at each foramen (level) into the transforaminal epidural space.   Additional Comments:  The patient tolerated the procedure well Dressing: Band-Aid    Post-procedure details: Patient was observed during the procedure. Post-procedure instructions were reviewed.  Patient left the clinic in stable condition.

## 2017-12-12 NOTE — Patient Instructions (Signed)

## 2017-12-12 NOTE — Progress Notes (Signed)
Ryan Farmer - 62 y.o. male MRN [id number]  Date of birth: December 12, 1955  Office Visit Note: Visit Date: 12/12/2017 PCP: Charlott Rakes, MD Referred by: Charlott Rakes, MD  Subjective: Chief Complaint  Patient presents with  . Lower Back - Pain  . Right Leg - Pain   HPI: Mr. Fountain is a 62 year old gentleman that comes in today for planned right L4 and L5 transforaminal epidural steroid injection.  Please see our prior evaluation and management note for further details and justification.   ROS Otherwise per HPI.  Assessment & Plan: Visit Diagnoses:  1. Lumbar radiculopathy     Plan: No additional findings.   Meds & Orders:  Meds ordered this encounter  Medications  . betamethasone acetate-betamethasone sodium phosphate (CELESTONE) injection 12 mg    Orders Placed This Encounter  Procedures  . XR C-ARM NO REPORT  . Epidural Steroid injection    Follow-up: Return if symptoms worsen or fail to improve.   Procedures: No procedures performed  Lumbosacral Transforaminal Epidural Steroid Injection - Sub-Pedicular Approach with Fluoroscopic Guidance  Patient: Ryan Farmer      Date of Birth: [birth year] MRN: [id number] PCP: Charlott Rakes, MD      Visit Date: 12/12/2017   Universal Protocol:    Date/Time: 12/12/2017  Consent Given By: the patient  Position: PRONE  Additional Comments: Vital signs were monitored before and after the procedure. Patient was prepped and draped in the usual sterile fashion. The correct patient, procedure, and site was verified.   Injection Procedure Details:  Procedure Site One Meds Administered:  Meds ordered this encounter  Medications  . betamethasone acetate-betamethasone sodium phosphate (CELESTONE) injection 12 mg    Laterality: Right  Location/Site:  L4-L5 L5-S1  Needle size: 22 G  Needle type: Spinal  Needle Placement: Transforaminal  Findings:    -Comments: Excellent flow of contrast along the nerve and  into the epidural space.  Procedure Details: After squaring off the end-plates to get a true AP view, the C-arm was positioned so that an oblique view of the foramen as noted above was visualized. The target area is just inferior to the "nose of the scotty dog" or sub pedicular. The soft tissues overlying this structure were infiltrated with 2-3 ml. of 1% Lidocaine without Epinephrine.  The spinal needle was inserted toward the target using a "trajectory" view along the fluoroscope beam.  Under AP and lateral visualization, the needle was advanced so it did not puncture dura and was located close the 6 O'Clock position of the pedical in AP tracterory. Biplanar projections were used to confirm position. Aspiration was confirmed to be negative for CSF and/or blood. A 1-2 ml. volume of Isovue-250 was injected and flow of contrast was noted at each level. Radiographs were obtained for documentation purposes.   After attaining the desired flow of contrast documented above, a 0.5 to 1.0 ml test dose of 0.25% Marcaine was injected into each respective transforaminal space.  The patient was observed for 90 seconds post injection.  After no sensory deficits were reported, and normal lower extremity motor function was noted,   the above injectate was administered so that equal amounts of the injectate were placed at each foramen (level) into the transforaminal epidural space.   Additional Comments:  The patient tolerated the procedure well Dressing: Band-Aid    Post-procedure details: Patient was observed during the procedure. Post-procedure instructions were reviewed.  Patient left the clinic in stable condition.    Clinical History: No  specialty comments available.   He reports that he has been smoking cigarettes.  He has been smoking about 0.50 packs per day. He has never used smokeless tobacco.  Recent Labs    12/13/16 1415 09/21/17 0956  HGBA1C 9.2 12.3    Objective:  VS:  HT:    WT:    BMI:     BP:135/68  HR: bpm  TEMP: ( )  RESP:  Physical Exam  Ortho Exam Imaging: Xr C-arm No Report  Result Date: 12/12/2017 Please see Notes or Procedures tab for imaging impression.   Past Medical/Family/Surgical/Social History: Medications & Allergies reviewed per EMR, new medications updated. Patient Active Problem List   Diagnosis Date Noted  . Cocaine abuse ([place]) 12/19/2016  . Shingles outbreak 12/13/2016  . Diabetes type 2, uncontrolled ([place]) 04/19/2016  . Metatarsalgia 10/19/2015  . Back muscle spasm 11/18/2013  . Hyperlipidemia 11/18/2013  . HTN (hypertension) 11/22/2012  . Chronic pain syndrome 11/22/2012  . Tobacco use disorder 11/22/2012  . Dental caries 11/22/2012   Past Medical History:  Diagnosis Date  . Arthritis   . Diabetes mellitus   . Shingles    Family History  Problem Relation Age of Onset  . Cancer Mother   . Stroke Father   . Heart disease Father   . Diabetes Father    Past Surgical History:  Procedure Laterality Date  . knee surgrery     Social History   Occupational History  . Not on file  Tobacco Use  . Smoking status: Current Every Day Smoker    Packs/day: 0.50    Types: Cigarettes  . Smokeless tobacco: Never Used  Substance and Sexual Activity  . Alcohol use: No    Comment: occ  . Drug use: Yes    Types: Marijuana  . Sexual activity: Not on file

## 2018-01-15 ENCOUNTER — Encounter: Payer: Self-pay | Admitting: Family Medicine

## 2018-01-15 ENCOUNTER — Ambulatory Visit: Payer: Medicare Other | Attending: Family Medicine | Admitting: Family Medicine

## 2018-01-15 VITALS — BP 125/75 | HR 68 | Temp 97.6°F | Ht 72.0 in | Wt 199.4 lb

## 2018-01-15 DIAGNOSIS — IMO0001 Reserved for inherently not codable concepts without codable children: Secondary | ICD-10-CM

## 2018-01-15 DIAGNOSIS — E1129 Type 2 diabetes mellitus with other diabetic kidney complication: Secondary | ICD-10-CM

## 2018-01-15 DIAGNOSIS — J018 Other acute sinusitis: Secondary | ICD-10-CM

## 2018-01-15 DIAGNOSIS — E7800 Pure hypercholesterolemia, unspecified: Secondary | ICD-10-CM

## 2018-01-15 DIAGNOSIS — R809 Proteinuria, unspecified: Secondary | ICD-10-CM | POA: Diagnosis not present

## 2018-01-15 DIAGNOSIS — Z79899 Other long term (current) drug therapy: Secondary | ICD-10-CM | POA: Diagnosis not present

## 2018-01-15 DIAGNOSIS — Z1211 Encounter for screening for malignant neoplasm of colon: Secondary | ICD-10-CM | POA: Diagnosis not present

## 2018-01-15 DIAGNOSIS — M5442 Lumbago with sciatica, left side: Secondary | ICD-10-CM | POA: Insufficient documentation

## 2018-01-15 DIAGNOSIS — L219 Seborrheic dermatitis, unspecified: Secondary | ICD-10-CM

## 2018-01-15 DIAGNOSIS — E1165 Type 2 diabetes mellitus with hyperglycemia: Secondary | ICD-10-CM | POA: Insufficient documentation

## 2018-01-15 DIAGNOSIS — M5441 Lumbago with sciatica, right side: Secondary | ICD-10-CM | POA: Insufficient documentation

## 2018-01-15 DIAGNOSIS — Z794 Long term (current) use of insulin: Secondary | ICD-10-CM | POA: Diagnosis not present

## 2018-01-15 DIAGNOSIS — I10 Essential (primary) hypertension: Secondary | ICD-10-CM | POA: Diagnosis not present

## 2018-01-15 LAB — POCT GLYCOSYLATED HEMOGLOBIN (HGB A1C): HbA1c, POC (controlled diabetic range): 7.9 % — AB (ref 0.0–7.0)

## 2018-01-15 LAB — GLUCOSE, POCT (MANUAL RESULT ENTRY): POC GLUCOSE: 117 mg/dL — AB (ref 70–99)

## 2018-01-15 MED ORDER — LISINOPRIL 2.5 MG PO TABS: 2.5000 mg | ORAL_TABLET | Freq: Every day | ORAL | 3 refills | Status: DC

## 2018-01-15 MED ORDER — GABAPENTIN 300 MG PO CAPS: 300.0000 mg | ORAL_CAPSULE | Freq: Two times a day (BID) | ORAL | 3 refills | Status: DC

## 2018-01-15 MED ORDER — TIZANIDINE HCL 4 MG PO TABS: 4.0000 mg | ORAL_TABLET | Freq: Three times a day (TID) | ORAL | 2 refills | Status: DC | PRN

## 2018-01-15 MED ORDER — INSULIN GLARGINE 100 UNIT/ML SOLOSTAR PEN: 25.0000 [IU] | PEN_INJECTOR | Freq: Every day | SUBCUTANEOUS | 5 refills | Status: DC

## 2018-01-15 MED ORDER — CETIRIZINE HCL 10 MG PO TABS: 10.0000 mg | ORAL_TABLET | Freq: Every day | ORAL | 1 refills | Status: DC

## 2018-01-15 MED ORDER — METFORMIN HCL 1000 MG PO TABS: 1000.0000 mg | ORAL_TABLET | Freq: Two times a day (BID) | ORAL | 5 refills | Status: DC

## 2018-01-15 MED ORDER — INSULIN GLARGINE 100 UNIT/ML SOLOSTAR PEN
25.0000 [IU] | PEN_INJECTOR | Freq: Every day | SUBCUTANEOUS | 5 refills | Status: DC
Start: 2018-01-15 — End: 2018-08-07

## 2018-01-15 MED ORDER — MELOXICAM 7.5 MG PO TABS: 7.5000 mg | ORAL_TABLET | Freq: Every day | ORAL | 3 refills | Status: DC

## 2018-01-15 MED ORDER — AMOXICILLIN 500 MG PO CAPS: 500.0000 mg | ORAL_CAPSULE | Freq: Three times a day (TID) | ORAL | 0 refills | Status: DC

## 2018-01-15 MED ORDER — ACCU-CHEK SOFTCLIX LANCETS MISC: 1.0000 | Freq: Three times a day (TID) | 12 refills | Status: DC

## 2018-01-15 MED ORDER — KETOCONAZOLE 2 % EX SHAM: 1.0000 "application " | MEDICATED_SHAMPOO | CUTANEOUS | 1 refills | Status: DC

## 2018-01-15 MED ORDER — ATORVASTATIN CALCIUM 20 MG PO TABS: 20.0000 mg | ORAL_TABLET | Freq: Every day | ORAL | 3 refills | Status: DC

## 2018-01-15 MED ORDER — GLUCOSE BLOOD VI STRP: 1.0000 | ORAL_STRIP | Freq: Three times a day (TID) | 12 refills | Status: DC

## 2018-01-15 MED FILL — LISINOPRIL 2.5 MG TABLET: 2.5 | 30 days supply | Qty: 30 | Fill #0

## 2018-01-15 MED FILL — tiZANidine HCL 4 MG TABS: 4 | 30 days supply | Qty: 90 | Fill #0

## 2018-01-15 MED FILL — MELOXICAM 7.5 MG TABLET: 7.5 | 30 days supply | Qty: 30 | Fill #0

## 2018-01-15 MED FILL — AMOXICILLIN 500 MG CAPSULE: 500 | 10 days supply | Qty: 30 | Fill #0

## 2018-01-15 MED FILL — ATORVASTATIN 20 MG TABLET: 20 | 30 days supply | Qty: 30 | Fill #0

## 2018-01-15 MED FILL — GABAPENTIN 300 MG CAPSULE: 300 | 30 days supply | Qty: 60 | Fill #0

## 2018-01-15 MED FILL — metFORMIN HCL 1000 MG TABS: 1000 | 30 days supply | Qty: 60 | Fill #0

## 2018-01-15 NOTE — Patient Instructions (Signed)

## 2018-01-15 NOTE — Progress Notes (Signed)
Subjective:  Patient ID: Ryan Farmer, male    DOB: [birth year]  Age: 62 y.o. MRN: [id number]  CC: Diabetes Mellitus  HPI Ryan Farmer is a 62 year old male with a history of type 2 diabetes mellitus (A1c 7.9), hypertension, hyperlipidemia who presents today to establish care with me. His A1c 7.9 which has trended down from 12.3 previously and he endorses doing well on Lantus, denies hypoglycemia, visual concerns or paresthesias.  Not up-to-date on annual eye exams and does not exercise. He informs me he has quit cocaine use and is working on cutting down on his smoking. Doing well on his antihypertensive and tolerating his statin with no complaints of adverse effects He received epidural spinal injections last month for low back pain which has improved significantly and is now rated at a 7/10 and does not radiate down his lower extremity. He complains of a one-week history of cough productive of yellowish sputum, ears popping, nasal congestion, alternating hot and cold sensations and decreased appetite with a positive history of sick contact in his niece.  Use of OTC cold remedies have been ineffective.  Past Medical History:  Diagnosis Date  . Arthritis   . Diabetes mellitus   . Shingles     Past Surgical History:  Procedure Laterality Date  . knee surgrery      No Known Allergies   Outpatient Medications Prior to Visit  Medication Sig Dispense Refill  . ACCU-CHEK SOFTCLIX LANCETS lancets 1 each by Other route 3 (three) times daily. 100 each 12  . BD PEN NEEDLE MICRO U/F 32G X 6 MM MISC USE UTD DAILY  3  . Blood Glucose Monitoring Suppl (ACCU-CHEK AVIVA PLUS) w/Device KIT 1 Device by Does not apply route 3 (three) times daily after meals. 1 kit 0  . cyclobenzaprine (FLEXERIL) 5 MG tablet TK 1 T PO HS  1  . glucose blood (ACCU-CHEK AVIVA PLUS) test strip 1 each by Other route 3 (three) times daily. 100 each 12  . hydrocortisone cream 1 % Apply 1 application topically 2 (two)  times daily. 120 g 1  . Insulin Pen Needle (B-D ULTRAFINE III SHORT PEN) 31G X 8 MM MISC 1 application by Does not apply route daily. 100 each 3  . lidocaine (LIDODERM) 5 % Place 1 patch onto the skin daily. Remove & Discard patch within 12 hours or as directed by MD 30 patch 3  . atorvastatin (LIPITOR) 20 MG tablet Take 1 tablet (20 mg total) by mouth daily. 30 tablet 3  . gabapentin (NEURONTIN) 300 MG capsule Take 1 capsule (300 mg total) by mouth 2 (two) times daily. 60 capsule 3  . Insulin Glargine (LANTUS) 100 UNIT/ML Solostar Pen Inject 25 Units into the skin daily. 15 mL 3  . ketoconazole (NIZORAL) 2 % shampoo Apply 1 application topically 2 (two) times a week. 120 mL 1  . lisinopril (PRINIVIL,ZESTRIL) 2.5 MG tablet Take 1 tablet (2.5 mg total) by mouth daily. Kidney protection 30 tablet 3  . meloxicam (MOBIC) 7.5 MG tablet Take 1 tablet (7.5 mg total) by mouth daily. 30 tablet 3  . metFORMIN (GLUCOPHAGE) 1000 MG tablet Take 1 tablet (1,000 mg total) by mouth 2 (two) times daily with a meal. 60 tablet 5  . tiZANidine (ZANAFLEX) 4 MG tablet Take 1 tablet (4 mg total) by mouth every 8 (eight) hours as needed for muscle spasms. 90 tablet 2   No facility-administered medications prior to visit.     ROS Review  of Systems  Constitutional: Positive for appetite change, chills and fatigue. Negative for activity change.  HENT: Positive for sinus pressure and sinus pain. Negative for sore throat.   Eyes: Negative for visual disturbance.  Respiratory: Positive for cough. Negative for chest tightness and shortness of breath.   Cardiovascular: Negative for chest pain and leg swelling.  Gastrointestinal: Negative for abdominal distention, abdominal pain, constipation and diarrhea.  Endocrine: Negative.   Genitourinary: Negative for dysuria.  Musculoskeletal: Negative for joint swelling and myalgias.  Skin: Negative for rash.  Allergic/Immunologic: Negative.   Neurological: Negative for weakness,  light-headedness and numbness.  Psychiatric/Behavioral: Negative for dysphoric mood and suicidal ideas.    Objective:  BP 125/75   Pulse 68   Temp 97.6 F (36.4 C) (Oral)   Ht 6' (1.829 m)   Wt 199 lb 6.4 oz (90.4 kg)   SpO2 95%   BMI 27.04 kg/m   BP/Weight 01/15/2018 12/12/2017 6/94/8546  Systolic BP 270 350 093  Diastolic BP 75 68 76  Wt. (Lbs) 199.4 - -  BMI 27.04 - -      Physical Exam  Constitutional: He is oriented to person, place, and time. He appears well-developed and well-nourished.  Cardiovascular: Normal rate, normal heart sounds and intact distal pulses.  No murmur heard. Pulmonary/Chest: Effort normal and breath sounds normal. He has no wheezes. He has no rales. He exhibits no tenderness.  Abdominal: Soft. Bowel sounds are normal. He exhibits no distension and no mass. There is no tenderness.  Musculoskeletal: Normal range of motion.  Neurological: He is alert and oriented to person, place, and time.  Skin: Skin is warm and dry.  Psychiatric: He has a normal mood and affect.      CMP Latest Ref Rng & Units 09/21/2017 08/18/2017 12/13/2016  Glucose 65 - 99 mg/dL 307(H) 348(H) 165(H)  BUN 8 - 27 mg/dL 26 20 31(H)  Creatinine 0.76 - 1.27 mg/dL 0.79 0.91 0.97  Sodium 134 - 144 mmol/L 138 134(L) 142  Potassium 3.5 - 5.2 mmol/L 4.8 4.8 4.8  Chloride 96 - 106 mmol/L 98 101 105  CO2 20 - 29 mmol/L 20 20(L) 19  Calcium 8.6 - 10.2 mg/dL 9.7 10.0 9.2  Total Protein 6.0 - 8.5 g/dL 6.9 - -  Total Bilirubin 0.0 - 1.2 mg/dL 0.3 - -  Alkaline Phos 39 - 117 IU/L 126(H) - -  AST 0 - 40 IU/L 15 - -  ALT 0 - 44 IU/L 26 - -    Lipid Panel     Component Value Date/Time   CHOL 202 (H) 09/21/2017 1050   TRIG 487 (H) 09/21/2017 1050   HDL 37 (L) 09/21/2017 1050   CHOLHDL 5.5 (H) 09/21/2017 1050   CHOLHDL 6.0 (H) 09/03/2015 1700   VLDL 55 (H) 09/03/2015 1700   LDLCALC Comment 09/21/2017 1050    Lab Results  Component Value Date   HGBA1C 7.9 (A) 01/15/2018     Assessment & Plan:   1. Uncontrolled type 2 diabetes mellitus with hyperglycemia (HCC) A1c of 7.9 which has trended down from 12.3 - POCT glucose (manual entry) - POCT glycosylated hemoglobin (Hb A1C) - Insulin Glargine (LANTUS) 100 UNIT/ML Solostar Pen; Inject 25 Units into the skin daily.  Dispense: 5 pen; Refill: 5 - Ambulatory referral to Ophthalmology  2. Acute non-recurrent sinusitis of other sinus Placed amoxicillin  3. Pure hypercholesterolemia Uncontrolled Continue statin, low-cholesterol diet, lifestyle modifications - atorvastatin (LIPITOR) 20 MG tablet; Take 1 tablet (20 mg total) by  mouth daily.  Dispense: 30 tablet; Refill: 3  4. Bilateral low back pain with bilateral sciatica, unspecified chronicity Status post epidural spinal injection Improved - gabapentin (NEURONTIN) 300 MG capsule; Take 1 capsule (300 mg total) by mouth 2 (two) times daily.  Dispense: 60 capsule; Refill: 3 - meloxicam (MOBIC) 7.5 MG tablet; Take 1 tablet (7.5 mg total) by mouth daily.  Dispense: 30 tablet; Refill: 3 - tiZANidine (ZANAFLEX) 4 MG tablet; Take 1 tablet (4 mg total) by mouth every 8 (eight) hours as needed for muscle spasms.  Dispense: 90 tablet; Refill: 2  5. Seborrheic dermatitis - ketoconazole (NIZORAL) 2 % shampoo; Apply 1 application topically 2 (two) times a week.  Dispense: 120 mL; Refill: 1  6. Essential hypertension Controlled Counseled on blood pressure goal of less than 130/80, low-sodium, DASH diet, medication compliance, 150 minutes of moderate intensity exercise per week. Discussed medication compliance, adverse effects. - lisinopril (PRINIVIL,ZESTRIL) 2.5 MG tablet; Take 1 tablet (2.5 mg total) by mouth daily. Kidney protection  Dispense: 30 tablet; Refill: 3  7. Type 2 diabetes mellitus with microalbuminuria, with long-term current use of insulin (HCC) Currently on ACE inhibitor - metFORMIN (GLUCOPHAGE) 1000 MG tablet; Take 1 tablet (1,000 mg total) by mouth 2  (two) times daily with a meal.  Dispense: 60 tablet; Refill: 5  8. Screening for colon cancer - Ambulatory referral to GI   Meds ordered this encounter  Medications  . Insulin Glargine (LANTUS) 100 UNIT/ML Solostar Pen    Sig: Inject 25 Units into the skin daily.    Dispense:  5 pen    Refill:  5  . amoxicillin (AMOXIL) 500 MG capsule    Sig: Take 1 capsule (500 mg total) by mouth 3 (three) times daily.    Dispense:  30 capsule    Refill:  0  . atorvastatin (LIPITOR) 20 MG tablet    Sig: Take 1 tablet (20 mg total) by mouth daily.    Dispense:  30 tablet    Refill:  3  . gabapentin (NEURONTIN) 300 MG capsule    Sig: Take 1 capsule (300 mg total) by mouth 2 (two) times daily.    Dispense:  60 capsule    Refill:  3  . ketoconazole (NIZORAL) 2 % shampoo    Sig: Apply 1 application topically 2 (two) times a week.    Dispense:  120 mL    Refill:  1  . lisinopril (PRINIVIL,ZESTRIL) 2.5 MG tablet    Sig: Take 1 tablet (2.5 mg total) by mouth daily. Kidney protection    Dispense:  30 tablet    Refill:  3  . meloxicam (MOBIC) 7.5 MG tablet    Sig: Take 1 tablet (7.5 mg total) by mouth daily.    Dispense:  30 tablet    Refill:  3  . metFORMIN (GLUCOPHAGE) 1000 MG tablet    Sig: Take 1 tablet (1,000 mg total) by mouth 2 (two) times daily with a meal.    Dispense:  60 tablet    Refill:  5  . tiZANidine (ZANAFLEX) 4 MG tablet    Sig: Take 1 tablet (4 mg total) by mouth every 8 (eight) hours as needed for muscle spasms.    Dispense:  90 tablet    Refill:  2  . cetirizine (ZYRTEC) 10 MG tablet    Sig: Take 1 tablet (10 mg total) by mouth daily.    Dispense:  30 tablet    Refill:  1  Follow-up: Return in about 3 months (around 04/17/2018) for Follow-up of chronic medical conditions.   Charlott Rakes MD

## 2018-01-29 ENCOUNTER — Telehealth (INDEPENDENT_AMBULATORY_CARE_PROVIDER_SITE_OTHER): Payer: Self-pay | Admitting: *Deleted

## 2018-01-30 NOTE — Telephone Encounter (Signed)
Pt scheduled for 02/19/18 with driver.

## 2018-01-30 NOTE — Telephone Encounter (Signed)
Called pt and left vm#1.

## 2018-01-30 NOTE — Telephone Encounter (Signed)
If still better than it was then ok to repeat last injection, otherwise ned MRI lspine

## 2018-02-16 ENCOUNTER — Other Ambulatory Visit: Payer: Self-pay | Admitting: Family Medicine

## 2018-02-19 ENCOUNTER — Encounter (INDEPENDENT_AMBULATORY_CARE_PROVIDER_SITE_OTHER): Payer: Medicare Other | Admitting: Physical Medicine and Rehabilitation

## 2018-02-22 DIAGNOSIS — H353111 Nonexudative age-related macular degeneration, right eye, early dry stage: Secondary | ICD-10-CM | POA: Diagnosis not present

## 2018-02-22 DIAGNOSIS — E139 Other specified diabetes mellitus without complications: Secondary | ICD-10-CM | POA: Diagnosis not present

## 2018-02-22 DIAGNOSIS — H2513 Age-related nuclear cataract, bilateral: Secondary | ICD-10-CM | POA: Diagnosis not present

## 2018-02-22 DIAGNOSIS — H35033 Hypertensive retinopathy, bilateral: Secondary | ICD-10-CM | POA: Diagnosis not present

## 2018-02-22 DIAGNOSIS — H3554 Dystrophies primarily involving the retinal pigment epithelium: Secondary | ICD-10-CM | POA: Diagnosis not present

## 2018-02-22 LAB — HM DIABETES EYE EXAM

## 2018-02-26 MED FILL — LISINOPRIL 2.5 MG TABLET: 2.5 | 30 days supply | Qty: 30 | Fill #1 | Status: TO

## 2018-02-26 MED FILL — tiZANidine HCL 4 MG TABS: 4 | 30 days supply | Qty: 90 | Fill #1 | Status: TO

## 2018-02-26 MED FILL — ATORVASTATIN 20 MG TABLET: 20 | 30 days supply | Qty: 30 | Fill #1 | Status: TO

## 2018-02-26 MED FILL — MELOXICAM 7.5 MG TABLET: 7.5 | 30 days supply | Qty: 30 | Fill #1 | Status: TO

## 2018-02-26 MED FILL — metFORMIN HCL 1000 MG TABS: 1000 | 30 days supply | Qty: 60 | Fill #1 | Status: TO

## 2018-02-26 MED FILL — GABAPENTIN 300 MG CAPSULE: 300 | 30 days supply | Qty: 60 | Fill #1 | Status: TO

## 2018-02-26 MED FILL — KETOCONAZOLE 2% SHAMPOO: 2 | 30 days supply | Qty: 120 | Fill #0 | Status: TO

## 2018-03-05 ENCOUNTER — Ambulatory Visit (INDEPENDENT_AMBULATORY_CARE_PROVIDER_SITE_OTHER): Payer: Self-pay

## 2018-03-05 ENCOUNTER — Encounter (INDEPENDENT_AMBULATORY_CARE_PROVIDER_SITE_OTHER): Payer: Self-pay | Admitting: Physical Medicine and Rehabilitation

## 2018-03-05 ENCOUNTER — Ambulatory Visit (INDEPENDENT_AMBULATORY_CARE_PROVIDER_SITE_OTHER): Payer: Medicare Other | Admitting: Physical Medicine and Rehabilitation

## 2018-03-05 VITALS — BP 127/77 | HR 89

## 2018-03-05 DIAGNOSIS — M5416 Radiculopathy, lumbar region: Secondary | ICD-10-CM | POA: Diagnosis not present

## 2018-03-05 DIAGNOSIS — N471 Phimosis: Secondary | ICD-10-CM | POA: Diagnosis not present

## 2018-03-05 DIAGNOSIS — M47816 Spondylosis without myelopathy or radiculopathy, lumbar region: Secondary | ICD-10-CM

## 2018-03-05 DIAGNOSIS — Z125 Encounter for screening for malignant neoplasm of prostate: Secondary | ICD-10-CM | POA: Diagnosis not present

## 2018-03-05 MED ORDER — BETAMETHASONE SOD PHOS & ACET 6 (3-3) MG/ML IJ SUSP
12.0000 mg | Freq: Once | INTRAMUSCULAR | Status: AC
  Administered 2018-03-05: 12 mg

## 2018-03-05 NOTE — Patient Instructions (Signed)

## 2018-03-05 NOTE — Progress Notes (Signed)
Ryan Farmer - 62 y.o. male MRN [id number]  Date of birth: 1956-01-13  Office Visit Note: Visit Date: 03/05/2018 PCP: Charlott Rakes, MD Referred by: Charlott Rakes, MD  Subjective: Chief Complaint  Patient presents with  . Lower Back - Pain  . Left Leg - Pain   HPI: Ryan Farmer is a 62 year old gentleman that we first saw as a self-referral for what was severe right radicular leg pain and a pretty classic L5 and S1 distribution.  Please see our prior notes for further details and justification but the patient has complications including uncontrolled diabetes history of chronic pain syndrome and remote history of cocaine abuse.  We have completed x-ray images which can also be reviewed.  Hip facet arthropathy and degenerative changes.  He had talked to our staff about coming in for repeat injection because the injection did help his right hip and leg pain significantly.  He comes in today not with returning symptoms but now with more left-sided symptoms.  He however once again rates his pain as 10 out of 10.  It does limit what he can do.  Is worse with standing and going from sit to stand.  He gets some referral in the left hip but not down the leg like he did on the right.  His right side is symptomatic free at this point in terms of his leg pain.  He has not had any new injuries or falls.  Has not had advanced imaging.  No red flag symptoms such as focal weakness or unexplained weight loss etc.  No bowel or bladder changes.   Review of Systems  Constitutional: Negative for chills, fever, malaise/fatigue and weight loss.  HENT: Negative for hearing loss and sinus pain.   Eyes: Negative for blurred vision, double vision and photophobia.  Respiratory: Negative for cough and shortness of breath.   Cardiovascular: Negative for chest pain, palpitations and leg swelling.  Gastrointestinal: Negative for abdominal pain, nausea and vomiting.  Genitourinary: Negative for flank pain.   Musculoskeletal: Positive for back pain. Negative for myalgias.  Skin: Negative for itching and rash.  Neurological: Negative for tingling, tremors, focal weakness and weakness.  Endo/Heme/Allergies: Negative.   Psychiatric/Behavioral: Negative for depression.  All other systems reviewed and are negative.  Otherwise per HPI.  Assessment & Plan: Visit Diagnoses:  1. Spondylosis without myelopathy or radiculopathy, lumbar region   2. Lumbar radiculopathy     Plan: Findings:  Chronic history of back pain with more recent radicular pain on the right which was relieved significantly with epidural injection.  Those notes can be reviewed.  He now has left-sided but really bilateral low back pain some referral in the left hip.  No groin pain.  Exam is consistent with facet joint mediated pain.  We will complete hopefully diagnostic and therapeutic facet joint blocks.  If the patient is again having more symptoms after this injection especially with her rated as 10 out of 10 pain we will obtain MRI of the lumbar spine.  Patient will continue with current medications.  He does not have anything that would warrant chronic opioid therapy but could seek that out if he needed to at a different practitioner and through his primary care physician.    Meds & Orders:  Meds ordered this encounter  Medications  . betamethasone acetate-betamethasone sodium phosphate (CELESTONE) injection 12 mg    Orders Placed This Encounter  Procedures  . Facet Injection  . XR C-ARM NO REPORT  Follow-up: Return if symptoms worsen or fail to improve.   Procedures: No procedures performed  Lumbar Facet Joint Intra-Articular Injection(s) with Fluoroscopic Guidance  Patient: Ryan Farmer      Date of Birth: Sep 15, 1955 MRN: [id number] PCP: Charlott Rakes, MD      Visit Date: 03/05/2018   Universal Protocol:    Date/Time: 03/05/2018  Consent Given By: the patient  Position: PRONE   Additional Comments: Vital  signs were monitored before and after the procedure. Patient was prepped and draped in the usual sterile fashion. The correct patient, procedure, and site was verified.   Injection Procedure Details:  Procedure Site One Meds Administered:  Meds ordered this encounter  Medications  . betamethasone acetate-betamethasone sodium phosphate (CELESTONE) injection 12 mg     Laterality: Bilateral  Location/Site:  L4-L5  Needle size: 22 guage  Needle type: Spinal  Needle Placement: Articular  Findings:  -Comments: Excellent flow of contrast producing a partial arthrogram.  Procedure Details: The fluoroscope beam is vertically oriented in AP, and the inferior recess is visualized beneath the lower pole of the inferior apophyseal process, which represents the target point for needle insertion. When direct visualization is difficult the target point is located at the medial projection of the vertebral pedicle. The region overlying each aforementioned target is locally anesthetized with a 1 to 2 ml. volume of 1% Lidocaine without Epinephrine.   The spinal needle was inserted into each of the above mentioned facet joints using biplanar fluoroscopic guidance. A 0.25 to 0.5 ml. volume of Isovue-250 was injected and a partial facet joint arthrogram was obtained. A single spot film was obtained of the resulting arthrogram.    One to 1.25 ml of the steroid/anesthetic solution was then injected into each of the facet joints noted above.   Additional Comments:  The patient tolerated the procedure well Dressing: Band-Aid    Post-procedure details: Patient was observed during the procedure. Post-procedure instructions were reviewed.  Patient left the clinic in stable condition.     Clinical History: No specialty comments available.   He reports that he has been smoking cigarettes. He has a 50.00 pack-year smoking history. He has quit using smokeless tobacco.  His smokeless tobacco use  included chew.  Recent Labs    09/21/17 0956 01/15/18 0852  HGBA1C 12.3 7.9*    Objective:  VS:  HT:    WT:   BMI:     BP:127/77  HR:89bpm  TEMP: ( )  RESP:  Physical Exam  Constitutional: He is oriented to person, place, and time. He appears well-developed and well-nourished. No distress.  HENT:  Head: Normocephalic and atraumatic.  Eyes: Pupils are equal, round, and reactive to light. Conjunctivae are normal.  Neck: Normal range of motion. Neck supple.  Cardiovascular: Regular rhythm and intact distal pulses.  Pulmonary/Chest: Effort normal. No respiratory distress.  Musculoskeletal:  Patient ambulates with a slightly forward flexed lumbar spine.  He does have pain with extension and facet joint loading left more than right.  No paraspinal tenderness.  No focal trigger points.  No pain with hip rotation.  No pain over the greater trochanters.  Good distal strength.  Neurological: He is alert and oriented to person, place, and time. He displays normal reflexes. He exhibits normal muscle tone. Coordination normal.  Skin: Skin is warm and dry. No rash noted. No erythema.  Psychiatric: He has a normal mood and affect.  Nursing note and vitals reviewed.   Ortho Exam Imaging: No results found.  Past Medical/Family/Surgical/Social History: Medications & Allergies reviewed per EMR, new medications updated. Patient Active Problem List   Diagnosis Date Noted  . Cocaine abuse ([place]) 12/19/2016  . Shingles outbreak 12/13/2016  . Diabetes type 2, uncontrolled ([place]) 04/19/2016  . Metatarsalgia 10/19/2015  . Back muscle spasm 11/18/2013  . Hyperlipidemia 11/18/2013  . HTN (hypertension) 11/22/2012  . Chronic pain syndrome 11/22/2012  . Tobacco use disorder 11/22/2012  . Dental caries 11/22/2012   Past Medical History:  Diagnosis Date  . Arthritis    All over  . Chronic pain syndrome   . Diabetes mellitus   . History of cocaine use   . History of kidney stones   .  Hypercholesteremia   . Hypertension   . Low back pain   . Lumbar radiculopathy   . Right leg pain   . Seborrheic dermatitis of scalp   . Shingles    Family History  Problem Relation Age of Onset  . Cancer Mother   . Stroke Father   . Heart disease Father   . Diabetes Father    Past Surgical History:  Procedure Laterality Date  . KNEE ARTHROSCOPY Bilateral    Social History   Occupational History  . Not on file  Tobacco Use  . Smoking status: Current Every Day Smoker    Packs/day: 1.00    Years: 50.00    Pack years: 50.00    Types: Cigarettes  . Smokeless tobacco: Former Systems developer    Types: Chew  Substance and Sexual Activity  . Alcohol use: Yes    Comment: occ  . Drug use: Yes    Types: Marijuana    Comment: twice a week  . Sexual activity: Not on file

## 2018-03-09 ENCOUNTER — Other Ambulatory Visit: Payer: Self-pay | Admitting: Urology

## 2018-03-13 ENCOUNTER — Other Ambulatory Visit: Payer: Self-pay

## 2018-03-13 ENCOUNTER — Encounter (HOSPITAL_BASED_OUTPATIENT_CLINIC_OR_DEPARTMENT_OTHER): Payer: Self-pay

## 2018-03-13 NOTE — Progress Notes (Signed)
Spoke No food after midnight/Clear liquids until 9:00 AM DOS, No smoking after midnight Arrival time:  1300 Labs:  Istat 8, EKG AM medications: Gabapentin, 1/2 Lantus Insulin Pre op orders:  Yes Ride home:  Truman Hayward (brother in law) [phone]

## 2018-03-16 ENCOUNTER — Encounter (HOSPITAL_BASED_OUTPATIENT_CLINIC_OR_DEPARTMENT_OTHER): Payer: Self-pay | Admitting: Anesthesiology

## 2018-03-16 ENCOUNTER — Ambulatory Visit (HOSPITAL_BASED_OUTPATIENT_CLINIC_OR_DEPARTMENT_OTHER)
Admission: RE | Admit: 2018-03-16 | Discharge: 2018-03-16 | Disposition: A | Discharge status: Home / Self Care | Payer: Medicare Other | Source: Ambulatory Visit | Attending: Urology | Admitting: Urology

## 2018-03-16 ENCOUNTER — Ambulatory Visit (HOSPITAL_BASED_OUTPATIENT_CLINIC_OR_DEPARTMENT_OTHER): Payer: Medicare Other | Admitting: Anesthesiology

## 2018-03-16 ENCOUNTER — Other Ambulatory Visit: Payer: Self-pay

## 2018-03-16 ENCOUNTER — Encounter (HOSPITAL_BASED_OUTPATIENT_CLINIC_OR_DEPARTMENT_OTHER)
Admission: RE | Disposition: A | Discharge status: Home / Self Care | Payer: Self-pay | Source: Ambulatory Visit | Attending: Urology

## 2018-03-16 DIAGNOSIS — Z791 Long term (current) use of non-steroidal anti-inflammatories (NSAID): Secondary | ICD-10-CM | POA: Insufficient documentation

## 2018-03-16 DIAGNOSIS — E785 Hyperlipidemia, unspecified: Secondary | ICD-10-CM | POA: Diagnosis not present

## 2018-03-16 DIAGNOSIS — Z79899 Other long term (current) drug therapy: Secondary | ICD-10-CM | POA: Insufficient documentation

## 2018-03-16 DIAGNOSIS — G894 Chronic pain syndrome: Secondary | ICD-10-CM | POA: Insufficient documentation

## 2018-03-16 DIAGNOSIS — M1990 Unspecified osteoarthritis, unspecified site: Secondary | ICD-10-CM | POA: Insufficient documentation

## 2018-03-16 DIAGNOSIS — Z794 Long term (current) use of insulin: Secondary | ICD-10-CM | POA: Diagnosis not present

## 2018-03-16 DIAGNOSIS — N471 Phimosis: Secondary | ICD-10-CM | POA: Diagnosis not present

## 2018-03-16 DIAGNOSIS — E669 Obesity, unspecified: Secondary | ICD-10-CM | POA: Insufficient documentation

## 2018-03-16 DIAGNOSIS — I10 Essential (primary) hypertension: Secondary | ICD-10-CM | POA: Insufficient documentation

## 2018-03-16 DIAGNOSIS — Z6829 Body mass index (BMI) 29.0-29.9, adult: Secondary | ICD-10-CM | POA: Diagnosis not present

## 2018-03-16 DIAGNOSIS — E119 Type 2 diabetes mellitus without complications: Secondary | ICD-10-CM | POA: Insufficient documentation

## 2018-03-16 DIAGNOSIS — F17210 Nicotine dependence, cigarettes, uncomplicated: Secondary | ICD-10-CM | POA: Diagnosis not present

## 2018-03-16 DIAGNOSIS — E7800 Pure hypercholesterolemia, unspecified: Secondary | ICD-10-CM | POA: Diagnosis not present

## 2018-03-16 HISTORY — DX: Pure hypercholesterolemia, unspecified: E78.00

## 2018-03-16 HISTORY — PX: CIRCUMCISION: SHX1350

## 2018-03-16 HISTORY — DX: Pain in right leg: M79.604

## 2018-03-16 HISTORY — DX: Low back pain, unspecified: M54.50

## 2018-03-16 HISTORY — DX: Essential (primary) hypertension: I10

## 2018-03-16 HISTORY — DX: Personal history of urinary calculi: Z87.442

## 2018-03-16 HISTORY — DX: Radiculopathy, lumbar region: M54.16

## 2018-03-16 HISTORY — DX: Personal history of other specified conditions: Z87.898

## 2018-03-16 HISTORY — DX: Chronic pain syndrome: G89.4

## 2018-03-16 HISTORY — DX: Seborrheic dermatitis, unspecified: L21.9

## 2018-03-16 HISTORY — DX: Low back pain: M54.5

## 2018-03-16 HISTORY — DX: Cocaine use, unspecified, in remission: F14.91

## 2018-03-16 LAB — GLUCOSE, CAPILLARY: Glucose-Capillary: 152 mg/dL — ABNORMAL HIGH (ref 70–99)

## 2018-03-16 LAB — POCT I-STAT, CHEM 8
BUN: 25 mg/dL — ABNORMAL HIGH (ref 8–23)
Calcium, Ion: 1.35 mmol/L (ref 1.15–1.40)
Chloride: 100 mmol/L (ref 98–111)
Creatinine, Ser: 0.7 mg/dL (ref 0.61–1.24)
GLUCOSE: 179 mg/dL — AB (ref 70–99)
HCT: 51 % (ref 39.0–52.0)
HEMOGLOBIN: 17.3 g/dL — AB (ref 13.0–17.0)
Potassium: 3.8 mmol/L (ref 3.5–5.1)
SODIUM: 138 mmol/L (ref 135–145)
TCO2: 21 mmol/L — AB (ref 22–32)

## 2018-03-16 SURGERY — CIRCUMCISION, ADULT
Anesthesia: General | Site: Penis

## 2018-03-16 MED ORDER — FENTANYL CITRATE (PF) 100 MCG/2ML IJ SOLN
INTRAMUSCULAR | Status: AC
  Filled 2018-03-16: qty 2

## 2018-03-16 MED ORDER — PROPOFOL 10 MG/ML IV BOLUS
INTRAVENOUS | Status: DC | PRN
  Administered 2018-03-16: 200 mg via INTRAVENOUS

## 2018-03-16 MED ORDER — LIDOCAINE 2% (20 MG/ML) 5 ML SYRINGE
INTRAMUSCULAR | Status: DC | PRN
  Administered 2018-03-16: 80 mg via INTRAVENOUS

## 2018-03-16 MED ORDER — FENTANYL CITRATE (PF) 100 MCG/2ML IJ SOLN
25.0000 ug | INTRAMUSCULAR | Status: DC | PRN
  Filled 2018-03-16: qty 1

## 2018-03-16 MED ORDER — LACTATED RINGERS IV SOLN
INTRAVENOUS | Status: DC
  Administered 2018-03-16 (×2): via INTRAVENOUS
  Filled 2018-03-16: qty 1000

## 2018-03-16 MED ORDER — DEXAMETHASONE SODIUM PHOSPHATE 4 MG/ML IJ SOLN
INTRAMUSCULAR | Status: DC | PRN
  Administered 2018-03-16: 4 mg via INTRAVENOUS

## 2018-03-16 MED ORDER — CLINDAMYCIN PHOSPHATE 900 MG/50ML IV SOLN
INTRAVENOUS | Status: AC
  Filled 2018-03-16: qty 50

## 2018-03-16 MED ORDER — ONDANSETRON HCL 4 MG/2ML IJ SOLN
INTRAMUSCULAR | Status: DC | PRN
  Administered 2018-03-16: 4 mg via INTRAVENOUS

## 2018-03-16 MED ORDER — TRAMADOL HCL 50 MG PO TABS: 50.0000 mg | ORAL_TABLET | Freq: Four times a day (QID) | ORAL | 0 refills | Status: DC | PRN

## 2018-03-16 MED ORDER — PHENYLEPHRINE 40 MCG/ML (10ML) SYRINGE FOR IV PUSH (FOR BLOOD PRESSURE SUPPORT)
PREFILLED_SYRINGE | INTRAVENOUS | Status: AC
  Filled 2018-03-16: qty 10

## 2018-03-16 MED ORDER — MIDAZOLAM HCL 2 MG/2ML IJ SOLN
INTRAMUSCULAR | Status: AC
  Filled 2018-03-16: qty 2

## 2018-03-16 MED ORDER — LIDOCAINE HCL (PF) 1 % IJ SOLN
INTRAMUSCULAR | Status: DC | PRN
  Administered 2018-03-16: 10 mL

## 2018-03-16 MED ORDER — LIDOCAINE 2% (20 MG/ML) 5 ML SYRINGE
INTRAMUSCULAR | Status: AC
  Filled 2018-03-16: qty 5

## 2018-03-16 MED ORDER — ONDANSETRON HCL 4 MG/2ML IJ SOLN
INTRAMUSCULAR | Status: AC
  Filled 2018-03-16: qty 2

## 2018-03-16 MED ORDER — DEXAMETHASONE SODIUM PHOSPHATE 10 MG/ML IJ SOLN
INTRAMUSCULAR | Status: AC
  Filled 2018-03-16: qty 1

## 2018-03-16 MED ORDER — CLINDAMYCIN PHOSPHATE 900 MG/50ML IV SOLN
900.0000 mg | INTRAVENOUS | Status: AC
  Administered 2018-03-16: 900 mg via INTRAVENOUS
  Filled 2018-03-16: qty 50

## 2018-03-16 MED ORDER — EPHEDRINE SULFATE-NACL 50-0.9 MG/10ML-% IV SOSY
PREFILLED_SYRINGE | INTRAVENOUS | Status: DC | PRN
  Administered 2018-03-16: 10 mg via INTRAVENOUS

## 2018-03-16 MED ORDER — PHENYLEPHRINE 40 MCG/ML (10ML) SYRINGE FOR IV PUSH (FOR BLOOD PRESSURE SUPPORT)
PREFILLED_SYRINGE | INTRAVENOUS | Status: DC | PRN
  Administered 2018-03-16 (×2): 80 ug via INTRAVENOUS
  Administered 2018-03-16 (×2): 120 ug via INTRAVENOUS

## 2018-03-16 MED ORDER — MIDAZOLAM HCL 5 MG/5ML IJ SOLN
INTRAMUSCULAR | Status: DC | PRN
  Administered 2018-03-16: 2 mg via INTRAVENOUS

## 2018-03-16 MED ORDER — PROPOFOL 10 MG/ML IV BOLUS
INTRAVENOUS | Status: AC
  Filled 2018-03-16: qty 40

## 2018-03-16 MED ORDER — BUPIVACAINE HCL 0.25 % IJ SOLN
INTRAMUSCULAR | Status: DC | PRN
  Administered 2018-03-16: 10 mL

## 2018-03-16 MED ORDER — FENTANYL CITRATE (PF) 100 MCG/2ML IJ SOLN
INTRAMUSCULAR | Status: DC | PRN
  Administered 2018-03-16 (×3): 50 ug via INTRAVENOUS

## 2018-03-16 MED ORDER — EPHEDRINE 5 MG/ML INJ
INTRAVENOUS | Status: AC
  Filled 2018-03-16: qty 10

## 2018-03-16 SURGICAL SUPPLY — 28 items
BANDAGE COBAN STERILE 2 (GAUZE/BANDAGES/DRESSINGS) ×1 IMPLANT
BLADE SURG 15 STRL LF DISP TIS (BLADE) ×1 IMPLANT
BLADE SURG 15 STRL SS (BLADE) ×3
BNDG COHESIVE 1X5 TAN STRL LF (GAUZE/BANDAGES/DRESSINGS) ×2 IMPLANT
BNDG CONFORM 2 STRL LF (GAUZE/BANDAGES/DRESSINGS) ×3 IMPLANT
COVER BACK TABLE 60X90IN (DRAPES) ×3 IMPLANT
COVER MAYO STAND STRL (DRAPES) ×3 IMPLANT
DRAPE LAPAROTOMY 100X72 PEDS (DRAPES) ×3 IMPLANT
ELECT NDL TIP 2.8 STRL (NEEDLE) IMPLANT
ELECT NEEDLE TIP 2.8 STRL (NEEDLE) IMPLANT
ELECT REM PT RETURN 9FT ADLT (ELECTROSURGICAL) ×3
ELECTRODE REM PT RTRN 9FT ADLT (ELECTROSURGICAL) ×1 IMPLANT
GAUZE XEROFORM 1X8 LF (GAUZE/BANDAGES/DRESSINGS) ×3 IMPLANT
GLOVE BIO SURGEON STRL SZ7.5 (GLOVE) ×3 IMPLANT
GOWN STRL REUS W/TWL LRG LVL3 (GOWN DISPOSABLE) ×3 IMPLANT
KIT TURNOVER CYSTO (KITS) ×3 IMPLANT
NDL HYPO 25X1 1.5 SAFETY (NEEDLE) ×1 IMPLANT
NEEDLE HYPO 25X1 1.5 SAFETY (NEEDLE) ×3 IMPLANT
NS IRRIG 500ML POUR BTL (IV SOLUTION) IMPLANT
PACK BASIN DAY SURGERY FS (CUSTOM PROCEDURE TRAY) ×3 IMPLANT
PENCIL BUTTON HOLSTER BLD 10FT (ELECTRODE) ×3 IMPLANT
SUT VIC AB 3-0 SH 27 (SUTURE) ×9
SUT VIC AB 3-0 SH 27X BRD (SUTURE) IMPLANT
SUT VIC AB 3-0 SH 27XBRD (SUTURE) IMPLANT
SYR CONTROL 10ML LL (SYRINGE) ×3 IMPLANT
TOWEL OR 17X24 6PK STRL BLUE (TOWEL DISPOSABLE) ×3 IMPLANT
TRAY DSU PREP LF (CUSTOM PROCEDURE TRAY) ×3 IMPLANT
WATER STERILE IRR 500ML POUR (IV SOLUTION) IMPLANT

## 2018-03-16 NOTE — Anesthesia Procedure Notes (Signed)
Procedure Name: LMA Insertion Date/Time: 03/16/2018 12:36 PM Performed by: Freddrick March, MD Pre-anesthesia Checklist: Patient identified, Emergency Drugs available, Suction available and Patient being monitored Patient Re-evaluated:Patient Re-evaluated prior to induction Oxygen Delivery Method: Circle system utilized Preoxygenation: Pre-oxygenation with 100% oxygen Induction Type: IV induction Ventilation: Mask ventilation without difficulty LMA: LMA inserted LMA Size: 4.0 Number of attempts: 1 Airway Equipment and Method: Bite block Placement Confirmation: positive ETCO2 Tube secured with: Tape Dental Injury: Teeth and Oropharynx as per pre-operative assessment

## 2018-03-16 NOTE — Transfer of Care (Signed)
  Last Vitals:  Vitals Value Taken Time  BP 131/92 03/16/2018  1:37 PM  Temp 36.6 C 03/16/2018  1:37 PM  Pulse 99 03/16/2018  1:40 PM  Resp 16 03/16/2018  1:40 PM  SpO2 97 % 03/16/2018  1:40 PM  Vitals shown include unvalidated device data.  Last Pain:  Vitals:   03/16/18 1208  TempSrc:   PainSc: 6          Immediate Anesthesia Transfer of Care Note  Patient: Ryan Farmer  Procedure(s) Performed: Procedure(s) (LRB): CIRCUMCISION ADULT WITH PENILE BLOCK (N/A)  Patient Location: PACU  Anesthesia Type: General  Level of Consciousness: awake, alert  and oriented  Airway & Oxygen Therapy: Patient Spontanous Breathing and Patient connected to nasal cannula oxygen, oral airway removed  Post-op Assessment: Report given to PACU RN and Post -op Vital signs reviewed and stable  Post vital signs: Reviewed and stable  Complications: No apparent anesthesia complications

## 2018-03-16 NOTE — Anesthesia Preprocedure Evaluation (Addendum)
Anesthesia Evaluation  Patient identified by MRN, date of birth, ID band Patient awake    Reviewed: Allergy & Precautions, NPO status , Patient's Chart, lab work & pertinent test results  Airway Mallampati: I  TM Distance: >3 FB Neck ROM: Full    Dental no notable dental hx. (+) Partial Upper, Teeth Intact, Dental Advisory Given   Pulmonary Current Smoker,    Pulmonary exam normal breath sounds clear to auscultation       Cardiovascular hypertension, Pt. on medications Normal cardiovascular exam Rhythm:Regular Rate:Normal     Neuro/Psych negative neurological ROS  negative psych ROS   GI/Hepatic negative GI ROS, Neg liver ROS, (+)     substance abuse  cocaine use,   Endo/Other  negative endocrine ROSdiabetes, Type 2, Insulin Dependent  Renal/GU negative Renal ROS  negative genitourinary   Musculoskeletal negative musculoskeletal ROS (+) Arthritis ,   Abdominal   Peds  Hematology negative hematology ROS (+)   Anesthesia Other Findings Phimosis  Reproductive/Obstetrics                           Anesthesia Physical Anesthesia Plan  ASA: II  Anesthesia Plan: General   Post-op Pain Management:    Induction: Intravenous  PONV Risk Score and Plan: 1 and Dexamethasone and Ondansetron  Airway Management Planned: LMA  Additional Equipment:   Intra-op Plan:   Post-operative Plan: Extubation in OR  Informed Consent: I have reviewed the patients History and Physical, chart, labs and discussed the procedure including the risks, benefits and alternatives for the proposed anesthesia with the patient or authorized representative who has indicated his/her understanding and acceptance.   Dental advisory given  Plan Discussed with: CRNA  Anesthesia Plan Comments:        Anesthesia Quick Evaluation

## 2018-03-16 NOTE — H&P (Signed)
Ryan Farmer is an 63 y.o. male.    Chief Complaint: Pre-op Circumcision  HPI:   1 - Phimosis - UNcircumcised obese diabetic with progressive foresking tightnening x years. INterfears with hygeine and intercourse.   PMH sig for obesity, lumbago, IDDM2 (A1c 12s, variable compliance). His PCP is Freeman Caldron PA with [employer].   Today "Ryan Farmer" is seen to proceed with circumcision.     Past Medical History:  Diagnosis Date  . Arthritis    All over  . Chronic pain syndrome   . Diabetes mellitus   . History of cocaine use   . History of kidney stones   . Hypercholesteremia   . Hypertension   . Low back pain   . Lumbar radiculopathy   . Right leg pain   . Seborrheic dermatitis of scalp   . Shingles     Past Surgical History:  Procedure Laterality Date  . KNEE ARTHROSCOPY Bilateral     Family History  Problem Relation Age of Onset  . Cancer Mother   . Stroke Father   . Heart disease Father   . Diabetes Father    Social History:  reports that he has been smoking cigarettes. He has a 50.00 pack-year smoking history. He has quit using smokeless tobacco.  His smokeless tobacco use included chew. He reports that he drinks alcohol. He reports that he has current or past drug history. Drug: Marijuana.  Allergies: No Known Allergies  No medications prior to admission.    No results found for this or any previous visit (from the past 48 hour(s)). No results found.  Review of Systems  Constitutional: Negative.  Negative for chills and fever.  HENT: Negative.   Eyes: Negative.   Respiratory: Negative.   Cardiovascular: Negative.   Gastrointestinal: Negative.   Genitourinary: Negative.   Musculoskeletal: Negative.   Skin: Negative.   Neurological: Negative.   Endo/Heme/Allergies: Negative.   Psychiatric/Behavioral: Negative.     Height 6' (1.829 m), weight 97.5 kg. Physical Exam  Constitutional: He appears well-developed.  HENT:  Head: Normocephalic.   Neck: Normal range of motion.  Respiratory: Effort normal.  GI: Soft.  Genitourinary:  Genitourinary Comments: No CVAT. Stable phimosis.   Musculoskeletal: Normal range of motion.  Neurological: He is alert.  Skin: Skin is warm.  Psychiatric: He has a normal mood and affect.     Assessment/Plan  Proceed as planned with circumcision. Risks, benefits, expected per-op course discussed previously and reiterated today.   Alexis Frock, MD 03/16/2018, 9:06 AM

## 2018-03-16 NOTE — Brief Op Note (Signed)
03/16/2018  1:33 PM  PATIENT:  Ryan Farmer Hem  62 y.o. male  PRE-OPERATIVE DIAGNOSIS:  PHIMOSIS  POST-OPERATIVE DIAGNOSIS:  PHIMOSIS  PROCEDURE:  Procedure(s) with comments: CIRCUMCISION ADULT WITH PENILE BLOCK (N/A) - 45 MINS  SURGEON:  Surgeon(s) and Role:    * Alexis Frock, MD - Primary  PHYSICIAN ASSISTANT:   ASSISTANTS: none   ANESTHESIA:   local and general  EBL:  minimal   BLOOD ADMINISTERED:none  DRAINS: none   LOCAL MEDICATIONS USED:  MARCAINE    and LIDOCAINE   SPECIMEN:  Source of Specimen:  phimotic foreskin  DISPOSITION OF SPECIMEN:  discard  COUNTS:  YES  TOURNIQUET:  * No tourniquets in log *  DICTATION: .Other Dictation: Dictation Number [id number]  PLAN OF CARE: Discharge to home after PACU  PATIENT DISPOSITION:  PACU - hemodynamically stable.   Delay start of Pharmacological VTE agent (>24hrs) due to surgical blood loss or risk of bleeding: yes

## 2018-03-16 NOTE — Anesthesia Postprocedure Evaluation (Signed)
Anesthesia Post Note  Patient: Ryan Farmer  Procedure(s) Performed: CIRCUMCISION ADULT WITH PENILE BLOCK (N/A Penis)     Patient location during evaluation: PACU Anesthesia Type: General Level of consciousness: awake and alert Pain management: pain level controlled Vital Signs Assessment: post-procedure vital signs reviewed and stable Respiratory status: spontaneous breathing, nonlabored ventilation, respiratory function stable and patient connected to nasal cannula oxygen Cardiovascular status: blood pressure returned to baseline and stable Postop Assessment: no apparent nausea or vomiting Anesthetic complications: no    Last Vitals:  Vitals:   03/16/18 1345 03/16/18 1400  BP: (!) 144/83 140/87  Pulse: (!) 104 (!) 101  Resp: 19 15  Temp:    SpO2: 99% 94%    Last Pain:  Vitals:   03/16/18 1400  TempSrc:   PainSc: 0-No pain                 Effie Berkshire

## 2018-03-16 NOTE — Op Note (Signed)
[name], [name] MEDICAL RECORD [id number] ACCOUNT [id number] DATE OF BIRTH:[birth year] FACILITY: [facility] LOCATION: [facility] PHYSICIAN:Jeris Roser Tresa Moore, MD  OPERATIVE REPORT  DATE OF PROCEDURE:  03/16/2018  PREOPERATIVE DIAGNOSIS:  Phimosis.  PROCEDURE:  Circumcision and penile block.  ESTIMATED BLOOD LOSS:  Nil.  COMPLICATIONS:  None.  SPECIMENS:  Phimotic foreskin for discard.  FINDINGS:   1.  Moderate phimosis precircumcision.   2.  Complete resolution of phimosis post-circumcision.  INDICATIONS:  The patient is a 62 year old gentleman with history of diabetes, insulin-dependent.  He has had progressive phimosis for a number of years, is now interfering with hygiene.  Options were discussed for management including topical therapies  versus dorsal slit versus circumcision and he wished to proceed with the latter.  Informed consent was obtained and placed in medical record.  DESCRIPTION OF PROCEDURE:  The patient being Ryan Farmer, procedure being circumcision was confirmed.  Procedure timeout was performed.  Intravenous antibiotics administered.  General anesthesia induced.  The patient was placed in the supine position  and sterile field was created.  Prepped and draped the patient's penis, perineum and proximal thighs using iodine.    Despite his phimosis, his foreskin was reducible and both the inner and outer leaflets were carefully prepped.  Next, a proximal collar  was marked at the level corresponding to the unstretched corona of the glans and a circumferential incision was made at this location.  Next, a distal collar was developed circumferentially approximately 5 mm proximal to the unstretched corona of the  glans and connected in the midline and the redundant preputial tissue separated from the underlying dartos using cautery dissection.  Additional point coagulation current resulted in excellent hemostasis.  A 12 o'clock anchor stitch was applied and a 6  o'clock  frenular U stitch was applied.  Next, 2 running suture lines of 3-0 Vicryl were used from the  6 o'clock to the 12 o'clock position on each side respectively, which revealed an excellent skin apposition.  Following these maneuvers, excellent hemostasis, excellent cosmesis.  A dressing of Xeroform followed by Doreene Nest and Coban was applied.   Anesthesia terminated.    The patient tolerated the procedure well.  No immediate apparent complications.  The patient was taken to [facility] in stable condition.  Please note just prior to dressing placement, penile block was performed.  Ten mL of a 50:50 Marcaine/lidocaine slurry was injected in a ring block type fashion at the base of the penis.  An additional 10 mL was applied and the projected course the dorsal penile nerve just below the pubic ramus.  AN/NUANCE  D:03/16/2018 T:03/16/2018 JOB:[id number]

## 2018-03-16 NOTE — Discharge Instructions (Signed)
1 - All stitches are dissolvable and will disappear over the next 2-3 weeks. Dressing may come off tomorrow morning and then no bathing restrictions. No sexual stimulation x 2 weeks.   2 - Call MD or go to ER for fever >102, severe pain / nausea / vomiting not relieved by medications, or acute change in medical status  Circumcision-Home Care Instructions  The following instructions have been prepared to help you care for yourself upon your return home today.   Wound Care & Hygiene:   You may apply ice to the penis.  This may help to decrease swelling.  Remove the dressing tomorrow.  If the dressing falls off before then, leave it off.  You may shower or bathe in 48 hours  Gently wash the penis with soap and water.  The stitches do not need to be removed.  Activity:  Do not drive or operate any equipment today.  The effects of anesthesia are still present, drowsiness may result.  Rest today, not necessarily flat bed rest, just take it easy.  You may resume your normal activity in one to two days or as indicated by your physician.  Sexual Activity:  Erection and sexual relations should be avoided for 2 weeks.  Return to Work:  One to two days or as indicated by your physician .  Diet:  Drink liquids or eat a very light diet this evening.  You may resume a regular diet tomorrow.  General Expectations of your surgery:   You may have a small amount of bleeding  The penis will be swollen and bruised for approximately one week  You may wake during the night with an erection, usually this is caused by having a full bladder so you should try to urinate (pass your water) to relieve the erection or apply ice to the penis  Unexpected Observations - Call your doctor if these occur!  Persistent or heavy bleeding  Temperature of 101 degrees or more  Severe pain not relieved by medication    Post Anesthesia Home Care Instructions  Activity: Get plenty of rest for the remainder of the  day. A responsible individual must stay with you for 24 hours following the procedure.  For the next 24 hours, DO NOT: -Drive a car -Paediatric nurse -Drink alcoholic beverages -Take any medication unless instructed by your physician -Make any legal decisions or sign important papers.  Meals: Start with liquid foods such as gelatin or soup. Progress to regular foods as tolerated. Avoid greasy, spicy, heavy foods. If nausea and/or vomiting occur, drink only clear liquids until the nausea and/or vomiting subsides. Call your physician if vomiting continues.  Special Instructions/Symptoms: Your throat may feel dry or sore from the anesthesia or the breathing tube placed in your throat during surgery. If this causes discomfort, gargle with warm salt water. The discomfort should disappear within 24 hours.

## 2018-03-19 NOTE — Procedures (Signed)
Lumbar Facet Joint Intra-Articular Injection(s) with Fluoroscopic Guidance  Patient: Ryan Farmer      Date of Birth: 1956-01-01 MRN: [id number] PCP: Charlott Rakes, MD      Visit Date: 03/05/2018   Universal Protocol:    Date/Time: 03/05/2018  Consent Given By: the patient  Position: PRONE   Additional Comments: Vital signs were monitored before and after the procedure. Patient was prepped and draped in the usual sterile fashion. The correct patient, procedure, and site was verified.   Injection Procedure Details:  Procedure Site One Meds Administered:  Meds ordered this encounter  Medications  . betamethasone acetate-betamethasone sodium phosphate (CELESTONE) injection 12 mg     Laterality: Bilateral  Location/Site:  L4-L5  Needle size: 22 guage  Needle type: Spinal  Needle Placement: Articular  Findings:  -Comments: Excellent flow of contrast producing a partial arthrogram.  Procedure Details: The fluoroscope beam is vertically oriented in AP, and the inferior recess is visualized beneath the lower pole of the inferior apophyseal process, which represents the target point for needle insertion. When direct visualization is difficult the target point is located at the medial projection of the vertebral pedicle. The region overlying each aforementioned target is locally anesthetized with a 1 to 2 ml. volume of 1% Lidocaine without Epinephrine.   The spinal needle was inserted into each of the above mentioned facet joints using biplanar fluoroscopic guidance. A 0.25 to 0.5 ml. volume of Isovue-250 was injected and a partial facet joint arthrogram was obtained. A single spot film was obtained of the resulting arthrogram.    One to 1.25 ml of the steroid/anesthetic solution was then injected into each of the facet joints noted above.   Additional Comments:  The patient tolerated the procedure well Dressing: Band-Aid    Post-procedure details: Patient was observed  during the procedure. Post-procedure instructions were reviewed.  Patient left the clinic in stable condition.

## 2018-03-19 NOTE — Progress Notes (Signed)
.  Numeric Pain Rating Scale and Functional Assessment Average Pain 10   In the last MONTH (on 0-10 scale) has pain interfered with the following?  1. General activity like being  able to carry out your everyday physical activities such as walking, climbing stairs, carrying groceries, or moving a chair?  Rating(5)   +Driver, -BT, -Dye Allergies.  

## 2018-03-20 ENCOUNTER — Encounter (HOSPITAL_BASED_OUTPATIENT_CLINIC_OR_DEPARTMENT_OTHER): Payer: Self-pay | Admitting: Urology

## 2018-04-24 ENCOUNTER — Telehealth (INDEPENDENT_AMBULATORY_CARE_PROVIDER_SITE_OTHER): Payer: Self-pay

## 2018-04-24 NOTE — Telephone Encounter (Signed)
Ok to repeat 

## 2018-04-24 NOTE — Telephone Encounter (Signed)
Pt called triage phone. Requesting another injection with Dr. Ernestina Patches asap. Says he had relief with one he had in August for several weeks but has been doing a lot of climbing ladders at work recently  and thinks this has aggravated his back.

## 2018-04-25 NOTE — Telephone Encounter (Signed)
Scheduled for 04/26/18 at 1300 with driver.

## 2018-04-25 NOTE — Telephone Encounter (Signed)
ok 

## 2018-04-26 ENCOUNTER — Encounter (INDEPENDENT_AMBULATORY_CARE_PROVIDER_SITE_OTHER): Payer: Self-pay | Admitting: Physical Medicine and Rehabilitation

## 2018-04-26 ENCOUNTER — Ambulatory Visit (INDEPENDENT_AMBULATORY_CARE_PROVIDER_SITE_OTHER): Payer: Self-pay

## 2018-04-26 ENCOUNTER — Ambulatory Visit (INDEPENDENT_AMBULATORY_CARE_PROVIDER_SITE_OTHER): Payer: Medicare Other | Admitting: Physical Medicine and Rehabilitation

## 2018-04-26 VITALS — BP 124/74 | HR 75 | Temp 97.7°F

## 2018-04-26 DIAGNOSIS — G8929 Other chronic pain: Secondary | ICD-10-CM | POA: Diagnosis not present

## 2018-04-26 DIAGNOSIS — M47816 Spondylosis without myelopathy or radiculopathy, lumbar region: Secondary | ICD-10-CM | POA: Diagnosis not present

## 2018-04-26 DIAGNOSIS — M545 Low back pain: Secondary | ICD-10-CM

## 2018-04-26 DIAGNOSIS — M5416 Radiculopathy, lumbar region: Secondary | ICD-10-CM | POA: Diagnosis not present

## 2018-04-26 DIAGNOSIS — G894 Chronic pain syndrome: Secondary | ICD-10-CM | POA: Diagnosis not present

## 2018-04-26 MED ORDER — BETAMETHASONE SOD PHOS & ACET 6 (3-3) MG/ML IJ SUSP
12.0000 mg | Freq: Once | INTRAMUSCULAR | Status: AC
  Administered 2018-04-26: 12 mg

## 2018-04-26 NOTE — Patient Instructions (Signed)

## 2018-04-26 NOTE — Progress Notes (Signed)
 .  Numeric Pain Rating Scale and Functional Assessment Average Pain 8   In the last MONTH (on 0-10 scale) has pain interfered with the following?  1. General activity like being  able to carry out your everyday physical activities such as walking, climbing stairs, carrying groceries, or moving a chair?  Rating(5)   +Driver, -BT, -Dye Allergies.  

## 2018-04-26 NOTE — Procedures (Signed)
Lumbosacral Transforaminal Epidural Steroid Injection - Sub-Pedicular Approach with Fluoroscopic Guidance  Patient: Ryan Farmer      Date of Birth: Aug 13, 1955 MRN: [id number] PCP: Charlott Rakes, MD      Visit Date: 04/26/2018   Universal Protocol:    Date/Time: 04/26/2018  Consent Given By: the patient  Position: PRONE  Additional Comments: Vital signs were monitored before and after the procedure. Patient was prepped and draped in the usual sterile fashion. The correct patient, procedure, and site was verified.   Injection Procedure Details:  Procedure Site One Meds Administered:  Meds ordered this encounter  Medications  . betamethasone acetate-betamethasone sodium phosphate (CELESTONE) injection 12 mg    Laterality: Bilateral  Location/Site:  L4-L5  Needle size: 22 G  Needle type: Spinal  Needle Placement: Transforaminal  Findings:    -Comments: Excellent flow of contrast along the nerve and into the epidural space.  Procedure Details: After squaring off the end-plates to get a true AP view, the C-arm was positioned so that an oblique view of the foramen as noted above was visualized. The target area is just inferior to the "nose of the scotty dog" or sub pedicular. The soft tissues overlying this structure were infiltrated with 2-3 ml. of 1% Lidocaine without Epinephrine.  The spinal needle was inserted toward the target using a "trajectory" view along the fluoroscope beam.  Under AP and lateral visualization, the needle was advanced so it did not puncture dura and was located close the 6 O'Clock position of the pedical in AP tracterory. Biplanar projections were used to confirm position. Aspiration was confirmed to be negative for CSF and/or blood. A 1-2 ml. volume of Isovue-250 was injected and flow of contrast was noted at each level. Radiographs were obtained for documentation purposes.   After attaining the desired flow of contrast documented above, a 0.5  to 1.0 ml test dose of 0.25% Marcaine was injected into each respective transforaminal space.  The patient was observed for 90 seconds post injection.  After no sensory deficits were reported, and normal lower extremity motor function was noted,   the above injectate was administered so that equal amounts of the injectate were placed at each foramen (level) into the transforaminal epidural space.   Additional Comments:  The patient tolerated the procedure well Dressing: Band-Aid    Post-procedure details: Patient was observed during the procedure. Post-procedure instructions were reviewed.  Patient left the clinic in stable condition.

## 2018-05-08 ENCOUNTER — Encounter (INDEPENDENT_AMBULATORY_CARE_PROVIDER_SITE_OTHER): Payer: Self-pay | Admitting: Physical Medicine and Rehabilitation

## 2018-05-08 NOTE — Progress Notes (Signed)
Ryan Farmer - 62 y.o. male MRN [id number]  Date of birth: [birth year]  Office Visit Note: Visit Date: 04/26/2018 PCP: Charlott Rakes, MD Referred by: Charlott Rakes, MD  Subjective: Chief Complaint  Patient presents with  . Lower Back - Pain  . Right Leg - Pain   HPI: Ryan Farmer is a 62 y.o. male who comes in today For reevaluation and management of back pain at times right hip and leg radicular pain.we first saw as a self-referral for what was severe right radicular leg pain and a pretty classic L5 and S1 distribution.  Please see our prior notes for further details and justification but the patient has complications including uncontrolled diabetes history of chronic pain syndrome and remote history of cocaine abuse.  We have completed x-ray images which can also be reviewed.  Hip facet arthropathy and degenerative changes.  He has not had MRI.  He has no red flag complaints.  He now reports a few weeks of right radicular leg pain once again.  Prior epidural injection gave him quite a bit of relief up until just recently.  We saw him in the interim and completed facet joint block on the left side for more left-sided low back pain once again now that is doing okay.  He seems to be flip-flopping back and forth between arthritic back pain and radicular pain.  He has not had any new injuries or falls.  Has not had advanced imaging.  No red flag symptoms such as focal weakness or unexplained weight loss etc.  No bowel or bladder changes.  He rates his pain as an 8 out of 10.  He does sometimes get lost to follow-up.  Review of Systems  Constitutional: Negative for chills, fever, malaise/fatigue and weight loss.  HENT: Negative for hearing loss and sinus pain.   Eyes: Negative for blurred vision, double vision and photophobia.  Respiratory: Negative for cough and shortness of breath.   Cardiovascular: Negative for chest pain, palpitations and leg swelling.  Gastrointestinal: Negative for  abdominal pain, nausea and vomiting.  Genitourinary: Negative for flank pain.  Musculoskeletal: Positive for back pain and joint pain. Negative for myalgias.       Right radicular leg pain  Skin: Negative for itching and rash.  Neurological: Negative for tremors, focal weakness and weakness.  Endo/Heme/Allergies: Negative.   Psychiatric/Behavioral: Negative for depression.  All other systems reviewed and are negative.  Otherwise per HPI.  Assessment & Plan: Visit Diagnoses:  1. Lumbar radiculopathy   2. Spondylosis without myelopathy or radiculopathy, lumbar region   3. Chronic bilateral low back pain without sciatica   4. Chronic pain syndrome     Plan: Findings:  Chronic worsening low back pain now right hip and leg pain has returned from prior epidural injection.  He clearly has some level of likely stenosis whether it central canal or lateral recess.  He has no focal weakness or bowel bladder or red flag complaints.  He clearly has some back pain that is mechanical from facet joints.  He also has poor core strengthening.  At this point I think given the amount of pain that he is having we will complete a bilateral L4 transforaminal injection looking at this is probable stenosis.  Depending on his relief we are going to get an MRI of the lumbar spine.  Prior to any other injections we will get a lumbar spine MRI.  If he does get good relief and depending on imaging findings  we will regroup with physical therapy continue with his current medications.  Narcotic medications managed currently with his primary care physician which is tramadol.  Patient does have a history of cocaine abuse.    Meds & Orders:  Meds ordered this encounter  Medications  . betamethasone acetate-betamethasone sodium phosphate (CELESTONE) injection 12 mg    Orders Placed This Encounter  Procedures  . XR C-ARM NO REPORT  . Epidural Steroid injection    Follow-up: Return if symptoms worsen or fail to improve,  for Will need Lumbar MRI if/when symptoms return.   Procedures: No procedures performed  Lumbosacral Transforaminal Epidural Steroid Injection - Sub-Pedicular Approach with Fluoroscopic Guidance  Patient: Ryan Farmer      Date of Birth: 1955-09-05 MRN: [id number] PCP: Charlott Rakes, MD      Visit Date: 04/26/2018   Universal Protocol:    Date/Time: 04/26/2018  Consent Given By: the patient  Position: PRONE  Additional Comments: Vital signs were monitored before and after the procedure. Patient was prepped and draped in the usual sterile fashion. The correct patient, procedure, and site was verified.   Injection Procedure Details:  Procedure Site One Meds Administered:  Meds ordered this encounter  Medications  . betamethasone acetate-betamethasone sodium phosphate (CELESTONE) injection 12 mg    Laterality: Bilateral  Location/Site:  L4-L5  Needle size: 22 G  Needle type: Spinal  Needle Placement: Transforaminal  Findings:    -Comments: Excellent flow of contrast along the nerve and into the epidural space.  Procedure Details: After squaring off the end-plates to get a true AP view, the C-arm was positioned so that an oblique view of the foramen as noted above was visualized. The target area is just inferior to the "nose of the scotty dog" or sub pedicular. The soft tissues overlying this structure were infiltrated with 2-3 ml. of 1% Lidocaine without Epinephrine.  The spinal needle was inserted toward the target using a "trajectory" view along the fluoroscope beam.  Under AP and lateral visualization, the needle was advanced so it did not puncture dura and was located close the 6 O'Clock position of the pedical in AP tracterory. Biplanar projections were used to confirm position. Aspiration was confirmed to be negative for CSF and/or blood. A 1-2 ml. volume of Isovue-250 was injected and flow of contrast was noted at each level. Radiographs were obtained for  documentation purposes.   After attaining the desired flow of contrast documented above, a 0.5 to 1.0 ml test dose of 0.25% Marcaine was injected into each respective transforaminal space.  The patient was observed for 90 seconds post injection.  After no sensory deficits were reported, and normal lower extremity motor function was noted,   the above injectate was administered so that equal amounts of the injectate were placed at each foramen (level) into the transforaminal epidural space.   Additional Comments:  The patient tolerated the procedure well Dressing: Band-Aid    Post-procedure details: Patient was observed during the procedure. Post-procedure instructions were reviewed.  Patient left the clinic in stable condition.     Clinical History: No specialty comments available.   He reports that he has been smoking cigarettes. He has a 50.00 pack-year smoking history. He has quit using smokeless tobacco.  His smokeless tobacco use included chew.  Recent Labs    09/21/17 0956 01/15/18 0852  HGBA1C 12.3 7.9*    Objective:  VS:  HT:    WT:   BMI:     BP:124/74  HR:75bpm  TEMP:97.7 F (36.5 C)(Oral)  RESP:  Physical Exam  Constitutional: He is oriented to person, place, and time. He appears well-developed and well-nourished. No distress.  HENT:  Head: Normocephalic and atraumatic.  Eyes: Pupils are equal, round, and reactive to light. Conjunctivae are normal.  Neck: Normal range of motion. Neck supple.  Cardiovascular: Regular rhythm and intact distal pulses.  Pulmonary/Chest: Effort normal. No respiratory distress.  Musculoskeletal:  Patient ambulates without aid with concordant back pain with facet joint loading and extension.  He has no pain with hip rotation is good distal strength bilaterally without clonus.  No pain over the greater trochanters.  Neurological: He is alert and oriented to person, place, and time. He exhibits normal muscle tone. Coordination normal.   Skin: Skin is warm and dry. No rash noted. No erythema.  Psychiatric: He has a normal mood and affect.  Nursing note and vitals reviewed.   Ortho Exam Imaging: No results found.  Past Medical/Family/Surgical/Social History: Medications & Allergies reviewed per EMR, new medications updated. Patient Active Problem List   Diagnosis Date Noted  . Spondylosis without myelopathy or radiculopathy, lumbar region 04/26/2018  . Cocaine abuse ([place]) 12/19/2016  . Shingles outbreak 12/13/2016  . Diabetes type 2, uncontrolled ([place]) 04/19/2016  . Metatarsalgia 10/19/2015  . Back muscle spasm 11/18/2013  . Hyperlipidemia 11/18/2013  . HTN (hypertension) 11/22/2012  . Chronic pain syndrome 11/22/2012  . Tobacco use disorder 11/22/2012  . Dental caries 11/22/2012   Past Medical History:  Diagnosis Date  . Arthritis    All over  . Chronic pain syndrome   . Diabetes mellitus   . History of cocaine use   . History of kidney stones   . Hypercholesteremia   . Hypertension   . Low back pain   . Lumbar radiculopathy   . Right leg pain   . Seborrheic dermatitis of scalp   . Shingles    Family History  Problem Relation Age of Onset  . Cancer Mother   . Stroke Father   . Heart disease Father   . Diabetes Father    Past Surgical History:  Procedure Laterality Date  . CIRCUMCISION N/A 03/16/2018   Procedure: CIRCUMCISION ADULT WITH PENILE BLOCK;  Surgeon: Alexis Frock, MD;  Location: [facility];  Service: Urology;  Laterality: N/A;  71 MINS  . KNEE ARTHROSCOPY Bilateral    Social History   Occupational History  . Not on file  Tobacco Use  . Smoking status: Current Every Day Smoker    Packs/day: 1.00    Years: 50.00    Pack years: 50.00    Types: Cigarettes  . Smokeless tobacco: Former Systems developer    Types: Chew  Substance and Sexual Activity  . Alcohol use: Yes    Comment: occ  . Drug use: Yes    Types: Marijuana    Comment: twice a week  . Sexual activity: Not on  file

## 2018-06-15 ENCOUNTER — Other Ambulatory Visit: Payer: Self-pay

## 2018-06-15 DIAGNOSIS — M5442 Lumbago with sciatica, left side: Principal | ICD-10-CM

## 2018-06-15 DIAGNOSIS — M5441 Lumbago with sciatica, right side: Secondary | ICD-10-CM

## 2018-06-15 MED ORDER — MELOXICAM 7.5 MG PO TABS: 7.5000 mg | ORAL_TABLET | Freq: Every day | ORAL | 0 refills | Status: DC

## 2018-06-27 ENCOUNTER — Telehealth: Payer: Self-pay | Admitting: Family Medicine

## 2018-06-27 DIAGNOSIS — E1129 Type 2 diabetes mellitus with other diabetic kidney complication: Secondary | ICD-10-CM

## 2018-06-27 DIAGNOSIS — R809 Proteinuria, unspecified: Principal | ICD-10-CM

## 2018-06-27 DIAGNOSIS — Z794 Long term (current) use of insulin: Principal | ICD-10-CM

## 2018-06-27 NOTE — Telephone Encounter (Signed)
1) Medication(s) Requested (by name): METFORMIN  2) Pharmacy of Choice: [facility]  3) Special Requests: Next appt 08/07/2018. No refills remain.   Approved medications will be sent to the pharmacy, we will reach out if there is an issue.  Requests made after 3pm may not be addressed until the following business day!  If a patient is unsure of the name of the medication(s) please note and ask patient to call back when they are able to provide all info, do not send to responsible party until all information is available!  08/07/2018 is next appt.  Pt has no refills remaining for Metformin. Please call in to  [place].  PT [place] [phone]

## 2018-06-28 MED ORDER — METFORMIN HCL 1000 MG PO TABS: 1000.0000 mg | ORAL_TABLET | Freq: Two times a day (BID) | ORAL | 1 refills | Status: DC

## 2018-07-08 ENCOUNTER — Other Ambulatory Visit: Payer: Self-pay | Admitting: Family Medicine

## 2018-07-08 DIAGNOSIS — R809 Proteinuria, unspecified: Principal | ICD-10-CM

## 2018-07-08 DIAGNOSIS — E1129 Type 2 diabetes mellitus with other diabetic kidney complication: Secondary | ICD-10-CM

## 2018-07-08 DIAGNOSIS — Z794 Long term (current) use of insulin: Principal | ICD-10-CM

## 2018-07-12 MED FILL — metFORMIN HCL 1000 MG TABS: 1000 | 30 days supply | Qty: 60 | Fill #0

## 2018-07-20 ENCOUNTER — Other Ambulatory Visit: Payer: Self-pay | Admitting: Family Medicine

## 2018-07-20 DIAGNOSIS — E7800 Pure hypercholesterolemia, unspecified: Secondary | ICD-10-CM

## 2018-07-25 ENCOUNTER — Other Ambulatory Visit: Payer: Self-pay | Admitting: Family Medicine

## 2018-07-25 DIAGNOSIS — E7800 Pure hypercholesterolemia, unspecified: Secondary | ICD-10-CM

## 2018-08-07 ENCOUNTER — Ambulatory Visit: Payer: Medicare Other | Attending: Family Medicine | Admitting: Family Medicine

## 2018-08-07 ENCOUNTER — Encounter: Payer: Self-pay | Admitting: Family Medicine

## 2018-08-07 VITALS — BP 119/75 | HR 68 | Temp 97.5°F | Ht 72.0 in | Wt 217.6 lb

## 2018-08-07 DIAGNOSIS — M5441 Lumbago with sciatica, right side: Secondary | ICD-10-CM | POA: Diagnosis not present

## 2018-08-07 DIAGNOSIS — E1165 Type 2 diabetes mellitus with hyperglycemia: Secondary | ICD-10-CM | POA: Diagnosis not present

## 2018-08-07 DIAGNOSIS — I10 Essential (primary) hypertension: Secondary | ICD-10-CM | POA: Insufficient documentation

## 2018-08-07 DIAGNOSIS — Z79899 Other long term (current) drug therapy: Secondary | ICD-10-CM | POA: Diagnosis not present

## 2018-08-07 DIAGNOSIS — Z87442 Personal history of urinary calculi: Secondary | ICD-10-CM | POA: Diagnosis not present

## 2018-08-07 DIAGNOSIS — M5442 Lumbago with sciatica, left side: Secondary | ICD-10-CM

## 2018-08-07 DIAGNOSIS — Z794 Long term (current) use of insulin: Secondary | ICD-10-CM | POA: Insufficient documentation

## 2018-08-07 DIAGNOSIS — Z791 Long term (current) use of non-steroidal anti-inflammatories (NSAID): Secondary | ICD-10-CM | POA: Diagnosis not present

## 2018-08-07 DIAGNOSIS — G894 Chronic pain syndrome: Secondary | ICD-10-CM | POA: Diagnosis not present

## 2018-08-07 DIAGNOSIS — R809 Proteinuria, unspecified: Secondary | ICD-10-CM | POA: Diagnosis not present

## 2018-08-07 DIAGNOSIS — M1990 Unspecified osteoarthritis, unspecified site: Secondary | ICD-10-CM | POA: Diagnosis not present

## 2018-08-07 DIAGNOSIS — Z8249 Family history of ischemic heart disease and other diseases of the circulatory system: Secondary | ICD-10-CM | POA: Insufficient documentation

## 2018-08-07 DIAGNOSIS — E119 Type 2 diabetes mellitus without complications: Secondary | ICD-10-CM | POA: Diagnosis present

## 2018-08-07 DIAGNOSIS — Z23 Encounter for immunization: Secondary | ICD-10-CM

## 2018-08-07 DIAGNOSIS — F17200 Nicotine dependence, unspecified, uncomplicated: Secondary | ICD-10-CM | POA: Diagnosis not present

## 2018-08-07 DIAGNOSIS — Z833 Family history of diabetes mellitus: Secondary | ICD-10-CM | POA: Insufficient documentation

## 2018-08-07 DIAGNOSIS — Z823 Family history of stroke: Secondary | ICD-10-CM | POA: Insufficient documentation

## 2018-08-07 DIAGNOSIS — E7800 Pure hypercholesterolemia, unspecified: Secondary | ICD-10-CM

## 2018-08-07 DIAGNOSIS — E1129 Type 2 diabetes mellitus with other diabetic kidney complication: Secondary | ICD-10-CM

## 2018-08-07 LAB — GLUCOSE, POCT (MANUAL RESULT ENTRY): POC GLUCOSE: 259 mg/dL — AB (ref 70–99)

## 2018-08-07 LAB — POCT GLYCOSYLATED HEMOGLOBIN (HGB A1C): HbA1c, POC (controlled diabetic range): 10.4 % — AB (ref 0.0–7.0)

## 2018-08-07 MED ORDER — ATORVASTATIN CALCIUM 20 MG PO TABS: 20.0000 mg | ORAL_TABLET | Freq: Every day | ORAL | 6 refills | Status: DC

## 2018-08-07 MED ORDER — METFORMIN HCL 1000 MG PO TABS: 1000.0000 mg | ORAL_TABLET | Freq: Two times a day (BID) | ORAL | 6 refills | Status: DC

## 2018-08-07 MED ORDER — GABAPENTIN 300 MG PO CAPS: 300.0000 mg | ORAL_CAPSULE | Freq: Two times a day (BID) | ORAL | 6 refills | Status: DC

## 2018-08-07 MED ORDER — INSULIN GLARGINE 100 UNIT/ML SOLOSTAR PEN: 30.0000 [IU] | PEN_INJECTOR | Freq: Every day | SUBCUTANEOUS | 5 refills | Status: DC

## 2018-08-07 MED ORDER — LISINOPRIL 2.5 MG PO TABS: 2.5000 mg | ORAL_TABLET | Freq: Every day | ORAL | 6 refills | Status: DC

## 2018-08-07 NOTE — Progress Notes (Signed)
Patient needs refills on medications. 

## 2018-08-07 NOTE — Progress Notes (Signed)
Subjective:  Patient ID: Ryan Farmer, male    DOB: 12-31-1955  Age: 63 y.o. MRN: [id number]  CC: Diabetes and Back Pain   HPI Ryan Farmer is a 63 year old male with a past medical history of Type 2 DM, Hypertension, Hyperlipidemia and Back Pain. At last visit he was referred for colon cancer screening and patient declined screening. Risk factors and benefits discussed with patient, he still refuses. He is requesting flu vaccine today.  DM II: Last HgbA1C 7.9  POC Glucose 259 Pt is not complaint with diet. Eye examination completed a few months back as stated per patient. Fasting glucose at home ranges from 250-300. Complaint with taking 1012m of metformin BID and Lantus 25 units. Tolerating metformin without complaints. He denies hypoglycemic episodes.  HTN: Compliant with taking hypertensive medications. Patient is a smoker and is not interested in smoking cessation. Denies chest pain, shortness of breath, pedal edema or speech difficulty.  HLD: Currently taking atorvastatin. Denies adverse effects from medication.   Back Pain: Currently taking Zanaflex he says sometimes it is helpful. Has not taken mobic to help with back pain.   Past Medical History:  Diagnosis Date  . Arthritis    All over  . Chronic pain syndrome   . Diabetes mellitus   . History of cocaine use   . History of kidney stones   . Hypercholesteremia   . Hypertension   . Low back pain   . Lumbar radiculopathy   . Right leg pain   . Seborrheic dermatitis of scalp   . Shingles    Past Surgical History:  Procedure Laterality Date  . CIRCUMCISION N/A 03/16/2018   Procedure: CIRCUMCISION ADULT WITH PENILE BLOCK;  Surgeon: MAlexis Frock MD;  [facility]  Service: Urology;  Laterality: N/A;  455MINS  . KNEE ARTHROSCOPY Bilateral    Family History  Problem Relation Age of Onset  . Cancer Mother   . Stroke Father   . Heart disease Father   . Diabetes Father     Outpatient  Medications Prior to Visit  Medication Sig Dispense Refill  . ACCU-CHEK SOFTCLIX LANCETS lancets 1 each by Other route 3 (three) times daily. 100 each 12  . BD PEN NEEDLE MICRO U/F 32G X 6 MM MISC USE UTD DAILY  3  . Blood Glucose Monitoring Suppl (ACCU-CHEK AVIVA PLUS) w/Device KIT 1 Device by Does not apply route 3 (three) times daily after meals. 1 kit 0  . cetirizine (ZYRTEC) 10 MG tablet Take 1 tablet (10 mg total) by mouth daily. 30 tablet 1  . cyclobenzaprine (FLEXERIL) 5 MG tablet as needed.   1  . DHA-EPA-Vitamin E (OMEGA-3 COMPLEX PO) Take by mouth.    .Marland Kitchenglucose blood (ACCU-CHEK AVIVA PLUS) test strip 1 each by Other route 3 (three) times daily. 100 each 12  . Insulin Pen Needle (B-D ULTRAFINE III SHORT PEN) 31G X 8 MM MISC 1 application by Does not apply route daily. 100 each 3  . meloxicam (MOBIC) 7.5 MG tablet Take 1 tablet (7.5 mg total) by mouth daily. MUST MAKE APPT FOR FURTHER REFILLS 30 tablet 0  . tiZANidine (ZANAFLEX) 4 MG tablet Take 1 tablet (4 mg total) by mouth every 8 (eight) hours as needed for muscle spasms. 90 tablet 2  . traMADol (ULTRAM) 50 MG tablet Take 1-2 tablets (50-100 mg total) by mouth every 6 (six) hours as needed for moderate pain or severe pain. Post-operatively 20 tablet  0  . atorvastatin (LIPITOR) 20 MG tablet TAKE 1 TABLET BY MOUTH EVERY DAY 30 tablet 0  . gabapentin (NEURONTIN) 300 MG capsule Take 1 capsule (300 mg total) by mouth 2 (two) times daily. 60 capsule 3  . Insulin Glargine (LANTUS) 100 UNIT/ML Solostar Pen Inject 25 Units into the skin daily. 5 pen 5  . lisinopril (PRINIVIL,ZESTRIL) 2.5 MG tablet Take 1 tablet (2.5 mg total) by mouth daily. Kidney protection (Patient taking differently: Take 2.5 mg by mouth every evening. Kidney protection) 30 tablet 3  . metFORMIN (GLUCOPHAGE) 1000 MG tablet Take 1 tablet (1,000 mg total) by mouth 2 (two) times daily with a meal. 60 tablet 1   No facility-administered medications prior to visit.      ROS Review of Systems  Constitutional: Negative for activity change, chills, diaphoresis, fatigue, fever and unexpected weight change.  Eyes: Negative for visual disturbance.  Respiratory: Negative for cough, chest tightness, shortness of breath and wheezing.   Cardiovascular: Negative for chest pain, palpitations and leg swelling.  Endocrine: Positive for polyuria. Negative for polyphagia.  Musculoskeletal: Positive for back pain. Negative for arthralgias and gait problem.  Skin: Negative for color change, rash and wound.  Neurological: Negative for facial asymmetry, speech difficulty, numbness and headaches.    Objective:  BP 119/75   Pulse 68   Temp (!) 97.5 F (36.4 C) (Oral)   Ht 6' (1.829 m)   Wt 217 lb 9.6 oz (98.7 kg)   SpO2 95%   BMI 29.51 kg/m   BP/Weight 08/07/2018 09/98/3382 5/0/5397  Systolic BP 673 419 379  Diastolic BP 75 74 93  Wt. (Lbs) 217.6 - 207.2  BMI 29.51 - 28.1   Lab Results  Component Value Date   HGBA1C 10.4 (A) 08/07/2018    Physical Exam Vitals signs reviewed.  Constitutional:      General: He is not in acute distress.    Appearance: Normal appearance. He is obese.  Eyes:     Extraocular Movements: Extraocular movements intact.     Pupils: Pupils are equal, round, and reactive to light.  Cardiovascular:     Rate and Rhythm: Normal rate and regular rhythm.     Pulses: Normal pulses.     Heart sounds: Normal heart sounds. No murmur. No friction rub. No gallop.   Pulmonary:     Effort: Pulmonary effort is normal.     Breath sounds: Normal breath sounds. No wheezing, rhonchi or rales.  Abdominal:     General: Bowel sounds are normal.  Feet:     Right foot:     Skin integrity: Skin integrity normal.     Left foot:     Skin integrity: Skin integrity normal.  Skin:    General: Skin is warm and dry.     Capillary Refill: Capillary refill takes less than 2 seconds.     Findings: No lesion or rash.  Neurological:     Mental Status:  He is alert and oriented to person, place, and time. Mental status is at baseline.  Psychiatric:        Mood and Affect: Mood normal.        Behavior: Behavior normal.        Thought Content: Thought content normal.        Judgment: Judgment normal.      Assessment & Plan:   1. Uncontrolled type 2 diabetes mellitus with hyperglycemia (HCC) Uncontrolled - POCT glucose (259) - POCT glycosylated hemoglobin (10.4) - Increase Insulin  Lantus to 30 units into the skin daily. Titrate Lantus 2 units every 3-4 days until fasting blood glucose is less than 200. Patient verbalizes instructions. Counseled on Diabetic diet, my plate method, 076 minutes of moderate intensity exercise/week Keep blood sugar logs with fasting goals of 80-120 mg/dl, random of less than 180 and in the event of sugars less than 60 mg/dl or greater than 400 mg/dl please notify the clinic ASAP. It is recommended that you undergo annual eye exams and annual foot exams. Pneumonia vaccine is recommended. - CMP14+EGFR - Lipid panel - Microalbumin/Creatinine Ratio, Urine - Administer Flu Vaccine - Insulin Glargine (LANTUS) 100 UNIT/ML Solostar Pen; Inject 30 Units into the skin daily.  Dispense: 5 pen; Refill: 5  2. Pure hypercholesterolemia Uncontrolled - Obtain Lipid Panel - Continue Atorvastatin 20 mg by mouth daily. - Low fat diet discussed. - atorvastatin (LIPITOR) 20 MG tablet; Take 1 tablet (20 mg total) by mouth daily.  Dispense: 30 tablet; Refill: 6  3. Bilateral low back pain with bilateral sciatica, unspecified chronicity Symptomatic - Will refill meloxicam 7.5 mg to help with back pain. - gabapentin (NEURONTIN) 300 MG capsule; Take 1 capsule (300 mg total) by mouth 2 (two) times daily.  Dispense: 60 capsule; Refill: 6  4. Essential hypertension Controlled - Continue Lisinopril 2.5 mg tab. - DASH Diet  - Smoking cessation offered and advised; patient declines at this time. - lisinopril (PRINIVIL,ZESTRIL)  2.5 MG tablet; Take 1 tablet (2.5 mg total) by mouth daily. Kidney protection  Dispense: 30 tablet; Refill: 6  5. Type 2 diabetes mellitus with microalbuminuria, with long-term current use of insulin (HCC) Uncontrolled - Please see DM plan above. - metFORMIN (GLUCOPHAGE) 1000 MG tablet; Take 1 tablet (1,000 mg total) by mouth 2 (two) times daily with a meal.  Dispense: 60 tablet; Refill: 6  6. Need for immunization against influenza - Will administer annual Flu Vaccine. - Flu Vaccine QUAD 36+ mos IM   Meds ordered this encounter  Medications  . atorvastatin (LIPITOR) 20 MG tablet    Sig: Take 1 tablet (20 mg total) by mouth daily.    Dispense:  30 tablet    Refill:  6  . gabapentin (NEURONTIN) 300 MG capsule    Sig: Take 1 capsule (300 mg total) by mouth 2 (two) times daily.    Dispense:  60 capsule    Refill:  6  . lisinopril (PRINIVIL,ZESTRIL) 2.5 MG tablet    Sig: Take 1 tablet (2.5 mg total) by mouth daily. Kidney protection    Dispense:  30 tablet    Refill:  6  . metFORMIN (GLUCOPHAGE) 1000 MG tablet    Sig: Take 1 tablet (1,000 mg total) by mouth 2 (two) times daily with a meal.    Dispense:  60 tablet    Refill:  6  . Insulin Glargine (LANTUS) 100 UNIT/ML Solostar Pen    Sig: Inject 30 Units into the skin daily.    Dispense:  5 pen    Refill:  5    Dose change    Follow-up: Return in about 3 months (around 11/06/2018) for Follow-up of chronic medical conditions.   Charlott Rakes MD

## 2018-08-07 NOTE — Patient Instructions (Signed)
Diabetes Mellitus and Nutrition, Adult  When you have diabetes (diabetes mellitus), it is very important to have healthy eating habits because your blood sugar (glucose) levels are greatly affected by what you eat and drink. Eating healthy foods in the appropriate amounts, at about the same times every day, can help you:  · Control your blood glucose.  · Lower your risk of heart disease.  · Improve your blood pressure.  · Reach or maintain a healthy weight.  Every person with diabetes is different, and each person has different needs for a meal plan. Your health care provider may recommend that you work with a diet and nutrition specialist (dietitian) to make a meal plan that is best for you. Your meal plan may vary depending on factors such as:  · The calories you need.  · The medicines you take.  · Your weight.  · Your blood glucose, blood pressure, and cholesterol levels.  · Your activity level.  · Other health conditions you have, such as heart or kidney disease.  How do carbohydrates affect me?  Carbohydrates, also called carbs, affect your blood glucose level more than any other type of food. Eating carbs naturally raises the amount of glucose in your blood. Carb counting is a method for keeping track of how many carbs you eat. Counting carbs is important to keep your blood glucose at a healthy level, especially if you use insulin or take certain oral diabetes medicines.  It is important to know how many carbs you can safely have in each meal. This is different for every person. Your dietitian can help you calculate how many carbs you should have at each meal and for each snack.  Foods that contain carbs include:  · Bread, cereal, rice, pasta, and crackers.  · Potatoes and corn.  · Peas, beans, and lentils.  · Milk and yogurt.  · Fruit and juice.  · Desserts, such as cakes, cookies, ice cream, and candy.  How does alcohol affect me?  Alcohol can cause a sudden decrease in blood glucose (hypoglycemia),  especially if you use insulin or take certain oral diabetes medicines. Hypoglycemia can be a life-threatening condition. Symptoms of hypoglycemia (sleepiness, dizziness, and confusion) are similar to symptoms of having too much alcohol.  If your health care provider says that alcohol is safe for you, follow these guidelines:  · Limit alcohol intake to no more than 1 drink per day for nonpregnant women and 2 drinks per day for men. One drink equals 12 oz of beer, 5 oz of wine, or 1½ oz of hard liquor.  · Do not drink on an empty stomach.  · Keep yourself hydrated with water, diet soda, or unsweetened iced tea.  · Keep in mind that regular soda, juice, and other mixers may contain a lot of sugar and must be counted as carbs.  What are tips for following this plan?    Reading food labels  · Start by checking the serving size on the "Nutrition Facts" label of packaged foods and drinks. The amount of calories, carbs, fats, and other nutrients listed on the label is based on one serving of the item. Many items contain more than one serving per package.  · Check the total grams (g) of carbs in one serving. You can calculate the number of servings of carbs in one serving by dividing the total carbs by 15. For example, if a food has 30 g of total carbs, it would be equal to 2   servings of carbs.  · Check the number of grams (g) of saturated and trans fats in one serving. Choose foods that have low or no amount of these fats.  · Check the number of milligrams (mg) of salt (sodium) in one serving. Most people should limit total sodium intake to less than 2,300 mg per day.  · Always check the nutrition information of foods labeled as "low-fat" or "nonfat". These foods may be higher in added sugar or refined carbs and should be avoided.  · Talk to your dietitian to identify your daily goals for nutrients listed on the label.  Shopping  · Avoid buying canned, premade, or processed foods. These foods tend to be high in fat, sodium,  and added sugar.  · Shop around the outside edge of the grocery store. This includes fresh fruits and vegetables, bulk grains, fresh meats, and fresh dairy.  Cooking  · Use low-heat cooking methods, such as baking, instead of high-heat cooking methods like deep frying.  · Cook using healthy oils, such as olive, canola, or sunflower oil.  · Avoid cooking with butter, cream, or high-fat meats.  Meal planning  · Eat meals and snacks regularly, preferably at the same times every day. Avoid going long periods of time without eating.  · Eat foods high in fiber, such as fresh fruits, vegetables, beans, and whole grains. Talk to your dietitian about how many servings of carbs you can eat at each meal.  · Eat 4-6 ounces (oz) of lean protein each day, such as lean meat, chicken, fish, eggs, or tofu. One oz of lean protein is equal to:  ? 1 oz of meat, chicken, or fish.  ? 1 egg.  ? ¼ cup of tofu.  · Eat some foods each day that contain healthy fats, such as avocado, nuts, seeds, and fish.  Lifestyle  · Check your blood glucose regularly.  · Exercise regularly as told by your health care provider. This may include:  ? 150 minutes of moderate-intensity or vigorous-intensity exercise each week. This could be brisk walking, biking, or water aerobics.  ? Stretching and doing strength exercises, such as yoga or weightlifting, at least 2 times a week.  · Take medicines as told by your health care provider.  · Do not use any products that contain nicotine or tobacco, such as cigarettes and e-cigarettes. If you need help quitting, ask your health care provider.  · Work with a counselor or diabetes educator to identify strategies to manage stress and any emotional and social challenges.  Questions to ask a health care provider  · Do I need to meet with a diabetes educator?  · Do I need to meet with a dietitian?  · What number can I call if I have questions?  · When are the best times to check my blood glucose?  Where to find more  information:  · American Diabetes Association: diabetes.org  · Academy of Nutrition and Dietetics: www.eatright.org  · National Institute of Diabetes and Digestive and Kidney Diseases (NIH): www.niddk.nih.gov  Summary  · A healthy meal plan will help you control your blood glucose and maintain a healthy lifestyle.  · Working with a diet and nutrition specialist (dietitian) can help you make a meal plan that is best for you.  · Keep in mind that carbohydrates (carbs) and alcohol have immediate effects on your blood glucose levels. It is important to count carbs and to use alcohol carefully.  This information is not intended to   replace advice given to you by your health care provider. Make sure you discuss any questions you have with your health care provider.  Document Released: 03/24/2005 Document Revised: 01/25/2017 Document Reviewed: 08/01/2016  Elsevier Interactive Patient Education © 2019 [employer]

## 2018-08-08 ENCOUNTER — Other Ambulatory Visit: Payer: Self-pay | Admitting: Family Medicine

## 2018-08-08 DIAGNOSIS — E7800 Pure hypercholesterolemia, unspecified: Secondary | ICD-10-CM

## 2018-08-08 LAB — LIPID PANEL
CHOLESTEROL TOTAL: 208 mg/dL — AB (ref 100–199)
Chol/HDL Ratio: 5.6 ratio — ABNORMAL HIGH (ref 0.0–5.0)
HDL: 37 mg/dL — AB (ref >39)
LDL Calculated: 97 mg/dL (ref 0–99)
Triglycerides: 371 mg/dL — ABNORMAL HIGH (ref 0–149)
VLDL CHOLESTEROL CAL: 74 mg/dL — AB (ref 5–40)

## 2018-08-08 LAB — CMP14+EGFR
A/G RATIO: 2.2 (ref 1.2–2.2)
ALK PHOS: 79 IU/L (ref 39–117)
ALT: 38 IU/L (ref 0–44)
AST: 20 IU/L (ref 0–40)
Albumin: 4.4 g/dL (ref 3.8–4.8)
BUN/Creatinine Ratio: 25 — ABNORMAL HIGH (ref 10–24)
BUN: 25 mg/dL (ref 8–27)
Bilirubin Total: 0.3 mg/dL (ref 0.0–1.2)
CHLORIDE: 101 mmol/L (ref 96–106)
CO2: 17 mmol/L — ABNORMAL LOW (ref 20–29)
Calcium: 9.2 mg/dL (ref 8.6–10.2)
Creatinine, Ser: 1.01 mg/dL (ref 0.76–1.27)
GFR calc non Af Amer: 80 mL/min/{1.73_m2} (ref >59)
GFR, EST AFRICAN AMERICAN: 92 mL/min/{1.73_m2} (ref >59)
GLUCOSE: 221 mg/dL — AB (ref 65–99)
Globulin, Total: 2 g/dL (ref 1.5–4.5)
POTASSIUM: 4.4 mmol/L (ref 3.5–5.2)
Sodium: 136 mmol/L (ref 134–144)
TOTAL PROTEIN: 6.4 g/dL (ref 6.0–8.5)

## 2018-08-08 LAB — MICROALBUMIN / CREATININE URINE RATIO
Creatinine, Urine: 172.1 mg/dL
Microalb/Creat Ratio: 145 mg/g creat — ABNORMAL HIGH (ref 0–29)
Microalbumin, Urine: 248.8 ug/mL

## 2018-08-08 MED ORDER — ATORVASTATIN CALCIUM 40 MG PO TABS: 40.0000 mg | ORAL_TABLET | Freq: Every day | ORAL | 3 refills | Status: DC

## 2018-08-13 ENCOUNTER — Telehealth: Payer: Self-pay

## 2018-08-13 NOTE — Telephone Encounter (Signed)
Patient was called and informed of lab results and medication change. 

## 2018-08-13 NOTE — Telephone Encounter (Signed)
-----   Message from Charlott Rakes, MD sent at 08/08/2018  5:40 PM EST ----- Cholesterol is elevated and I have increased Lipitor to 40 mg.  Please encourage to adhere to a, low-cholesterol diet and exercise.  His urine still also spilling out protein.  This could be due to his diabetes mellitus and adequate glycemic control and use of medications like lisinopril should help curtail this.

## 2018-09-10 ENCOUNTER — Telehealth (INDEPENDENT_AMBULATORY_CARE_PROVIDER_SITE_OTHER): Payer: Self-pay | Admitting: Physical Medicine and Rehabilitation

## 2018-09-10 NOTE — Telephone Encounter (Signed)
Pt came into the office said he would like to scheduled an appt with Dr.Newton for an injection in his back.  Please give him a call [phone]

## 2018-09-10 NOTE — Telephone Encounter (Signed)
ok 

## 2018-09-10 NOTE — Telephone Encounter (Signed)
Pt had Bil L4-5 TF on 04/26/18, if symptoms are the same and no new injuries/traumas ok to schedule? Please see message below also. Please Advise.

## 2018-09-10 NOTE — Telephone Encounter (Signed)
Called pt and lvm #1 

## 2018-09-11 NOTE — Telephone Encounter (Signed)
Scheduled for 3/23 at 1000.

## 2018-09-28 ENCOUNTER — Encounter (HOSPITAL_COMMUNITY): Payer: Self-pay | Admitting: *Deleted

## 2018-09-28 ENCOUNTER — Other Ambulatory Visit: Payer: Self-pay

## 2018-09-28 ENCOUNTER — Emergency Department (HOSPITAL_COMMUNITY)
Admission: EM | Admit: 2018-09-28 | Discharge: 2018-09-28 | Disposition: A | Discharge status: Home / Self Care | Payer: Medicare Other | Attending: Emergency Medicine | Admitting: Emergency Medicine

## 2018-09-28 DIAGNOSIS — Z794 Long term (current) use of insulin: Secondary | ICD-10-CM | POA: Diagnosis not present

## 2018-09-28 DIAGNOSIS — Z79899 Other long term (current) drug therapy: Secondary | ICD-10-CM | POA: Diagnosis not present

## 2018-09-28 DIAGNOSIS — R739 Hyperglycemia, unspecified: Secondary | ICD-10-CM

## 2018-09-28 DIAGNOSIS — F17210 Nicotine dependence, cigarettes, uncomplicated: Secondary | ICD-10-CM | POA: Diagnosis not present

## 2018-09-28 DIAGNOSIS — E1165 Type 2 diabetes mellitus with hyperglycemia: Secondary | ICD-10-CM | POA: Insufficient documentation

## 2018-09-28 DIAGNOSIS — I10 Essential (primary) hypertension: Secondary | ICD-10-CM | POA: Diagnosis not present

## 2018-09-28 DIAGNOSIS — R7989 Other specified abnormal findings of blood chemistry: Secondary | ICD-10-CM

## 2018-09-28 DIAGNOSIS — R350 Frequency of micturition: Secondary | ICD-10-CM | POA: Diagnosis present

## 2018-09-28 LAB — COMPREHENSIVE METABOLIC PANEL
ALT: 28 U/L (ref 0–44)
AST: 22 U/L (ref 15–41)
Albumin: 3.7 g/dL (ref 3.5–5.0)
Alkaline Phosphatase: 84 U/L (ref 38–126)
Anion gap: 9 (ref 5–15)
BUN: 25 mg/dL — ABNORMAL HIGH (ref 8–23)
CO2: 25 mmol/L (ref 22–32)
Calcium: 9.2 mg/dL (ref 8.9–10.3)
Chloride: 101 mmol/L (ref 98–111)
Creatinine, Ser: 1.25 mg/dL — ABNORMAL HIGH (ref 0.61–1.24)
GFR calc Af Amer: >60 mL/min (ref >60)
GFR calc non Af Amer: >60 mL/min (ref >60)
Glucose, Bld: 370 mg/dL — ABNORMAL HIGH (ref 70–99)
POTASSIUM: 4.1 mmol/L (ref 3.5–5.1)
Sodium: 135 mmol/L (ref 135–145)
TOTAL PROTEIN: 6.2 g/dL — AB (ref 6.5–8.1)
Total Bilirubin: 0.6 mg/dL (ref 0.3–1.2)

## 2018-09-28 LAB — URINALYSIS, ROUTINE W REFLEX MICROSCOPIC
Bacteria, UA: NONE SEEN
Bilirubin Urine: NEGATIVE
Glucose, UA: >=500 mg/dL — AB
Ketones, ur: NEGATIVE mg/dL
Leukocytes,Ua: NEGATIVE
Nitrite: NEGATIVE
Protein, ur: NEGATIVE mg/dL
Specific Gravity, Urine: 1.022 (ref 1.005–1.030)
pH: 7 (ref 5.0–8.0)

## 2018-09-28 LAB — CBG MONITORING, ED
GLUCOSE-CAPILLARY: 292 mg/dL — AB (ref 70–99)
Glucose-Capillary: 355 mg/dL — ABNORMAL HIGH (ref 70–99)
Glucose-Capillary: 159 mg/dL — ABNORMAL HIGH (ref 70–99)

## 2018-09-28 LAB — CBC WITH DIFFERENTIAL/PLATELET
ABS IMMATURE GRANULOCYTES: 0.03 10*3/uL (ref 0.00–0.07)
Basophils Absolute: 0 10*3/uL (ref 0.0–0.1)
Basophils Relative: 0 %
Eosinophils Absolute: 0.1 10*3/uL (ref 0.0–0.5)
Eosinophils Relative: 1 %
HCT: 49.7 % (ref 39.0–52.0)
Hemoglobin: 15.9 g/dL (ref 13.0–17.0)
Immature Granulocytes: 0 %
LYMPHS PCT: 24 %
Lymphs Abs: 2.1 10*3/uL (ref 0.7–4.0)
MCH: 28.1 pg (ref 26.0–34.0)
MCHC: 32 g/dL (ref 30.0–36.0)
MCV: 87.8 fL (ref 80.0–100.0)
Monocytes Absolute: 0.9 10*3/uL (ref 0.1–1.0)
Monocytes Relative: 10 %
NEUTROS ABS: 5.7 10*3/uL (ref 1.7–7.7)
Neutrophils Relative %: 65 %
Platelets: 201 10*3/uL (ref 150–400)
RBC: 5.66 MIL/uL (ref 4.22–5.81)
RDW: 12.8 % (ref 11.5–15.5)
WBC: 8.8 10*3/uL (ref 4.0–10.5)
nRBC: 0 % (ref 0.0–0.2)

## 2018-09-28 MED ORDER — INSULIN ASPART 100 UNIT/ML ~~LOC~~ SOLN
5.0000 [IU] | Freq: Once | SUBCUTANEOUS | Status: AC
  Administered 2018-09-28: 5 [IU] via INTRAVENOUS

## 2018-09-28 MED ORDER — SODIUM CHLORIDE 0.9 % IV BOLUS
1000.0000 mL | Freq: Once | INTRAVENOUS | Status: AC
  Administered 2018-09-28: 1000 mL via INTRAVENOUS

## 2018-09-28 MED ORDER — ALBENDAZOLE 200 MG PO TABS: ORAL_TABLET | ORAL | 0 refills | Status: DC

## 2018-09-28 NOTE — ED Provider Notes (Signed)
[facility] EMERGENCY DEPARTMENT Provider Note   CSN: [id number] Arrival date & time: 09/28/18  1845  History   Chief Complaint Chief Complaint  Patient presents with  . Diabetes  . Urinary Frequency    HPI Ryan Farmer is a 64 y.o. male with a hx of tobacco abuse, T2DM, HTN, hypercholesterolemia, cocaine abuse, & chronic pain syndrome who presents to the ER with chief complaint of polyuria/polydipsia for the past 2-3 months. He states sxs are constant. No alleviating/aggravating factors. States he attempts to stay well hydrated, but feels he is always thirsty. States this seems to be worsening, but no significant acute change to prompt ER visit. He also mentions his stools appear abnormal, he states "they just look different" he is unable to clarify this for me. Ultimately he states a friend he spends a lot of time with had worms in his stool and this is what the patient is worried about. He states he feels hot a lot but no fevers or chills. Denies dysuria, testicular pain/swelling, penile discharge, nausea, vomiting, diarrhea, melena, hematochezia, constipation, chest pain, dyspnea, or recent abx/travel. Home CBGs run 200-400.     HPI  Past Medical History:  Diagnosis Date  . Arthritis    All over  . Chronic pain syndrome   . Diabetes mellitus   . History of cocaine use   . History of kidney stones   . Hypercholesteremia   . Hypertension   . Low back pain   . Lumbar radiculopathy   . Right leg pain   . Seborrheic dermatitis of scalp   . Shingles     Patient Active Problem List   Diagnosis Date Noted  . Spondylosis without myelopathy or radiculopathy, lumbar region 04/26/2018  . Cocaine abuse ([place]) 12/19/2016  . Shingles outbreak 12/13/2016  . Diabetes type 2, uncontrolled ([place]) 04/19/2016  . Metatarsalgia 10/19/2015  . Back muscle spasm 11/18/2013  . Hyperlipidemia 11/18/2013  . HTN (hypertension) 11/22/2012  . Chronic pain syndrome 11/22/2012  .  Tobacco use disorder 11/22/2012  . Dental caries 11/22/2012    Past Surgical History:  Procedure Laterality Date  . CIRCUMCISION N/A 03/16/2018   Procedure: CIRCUMCISION ADULT WITH PENILE BLOCK;  Surgeon: Alexis Frock, MD;  [facility]: [facility];  Service: Urology;  Laterality: N/A;  75 MINS  . KNEE ARTHROSCOPY Bilateral         Home Medications    Prior to Admission medications   Medication Sig Start Date End Date Taking? Authorizing Provider  ACCU-CHEK SOFTCLIX LANCETS lancets 1 each by Other route 3 (three) times daily. 01/15/18   Charlott Rakes, MD  atorvastatin (LIPITOR) 40 MG tablet Take 1 tablet (40 mg total) by mouth daily. 08/08/18   Charlott Rakes, MD  BD PEN NEEDLE MICRO U/F 32G X 6 MM MISC USE UTD DAILY 09/11/17   [provider]  Blood Glucose Monitoring Suppl (ACCU-CHEK AVIVA PLUS) w/Device KIT 1 Device by Does not apply route 3 (three) times daily after meals. 12/13/16   Funches, Adriana Mccallum, MD  cetirizine (ZYRTEC) 10 MG tablet Take 1 tablet (10 mg total) by mouth daily. 01/15/18   Charlott Rakes, MD  cyclobenzaprine (FLEXERIL) 5 MG tablet as needed.  11/10/17   [provider]  DHA-EPA-Vitamin E (OMEGA-3 COMPLEX PO) Take by mouth.    [provider]  gabapentin (NEURONTIN) 300 MG capsule Take 1 capsule (300 mg total) by mouth 2 (two) times daily. 08/07/18   Charlott Rakes, MD  glucose blood (  ACCU-CHEK AVIVA PLUS) test strip 1 each by Other route 3 (three) times daily. 01/15/18   Charlott Rakes, MD  Insulin Glargine (LANTUS) 100 UNIT/ML Solostar Pen Inject 30 Units into the skin daily. 08/07/18   Charlott Rakes, MD  Insulin Pen Needle (B-D ULTRAFINE III SHORT PEN) 31G X 8 MM MISC 1 application by Does not apply route daily. 12/13/16   Funches, Adriana Mccallum, MD  lisinopril (PRINIVIL,ZESTRIL) 2.5 MG tablet Take 1 tablet (2.5 mg total) by mouth daily. Kidney protection 08/07/18   Charlott Rakes, MD  meloxicam (MOBIC) 7.5 MG tablet Take 1 tablet (7.5  mg total) by mouth daily. MUST MAKE APPT FOR FURTHER REFILLS 06/15/18   Charlott Rakes, MD  metFORMIN (GLUCOPHAGE) 1000 MG tablet Take 1 tablet (1,000 mg total) by mouth 2 (two) times daily with a meal. 08/07/18   Charlott Rakes, MD  tiZANidine (ZANAFLEX) 4 MG tablet Take 1 tablet (4 mg total) by mouth every 8 (eight) hours as needed for muscle spasms. 01/15/18   Charlott Rakes, MD  traMADol (ULTRAM) 50 MG tablet Take 1-2 tablets (50-100 mg total) by mouth every 6 (six) hours as needed for moderate pain or severe pain. Post-operatively 03/16/18 03/16/19  Alexis Frock, MD    Family History Family History  Problem Relation Age of Onset  . Cancer Mother   . Stroke Father   . Heart disease Father   . Diabetes Father     Social History Social History   Tobacco Use  . Smoking status: Current Every Day Smoker    Packs/day: 1.00    Years: 50.00    Pack years: 50.00    Types: Cigarettes  . Smokeless tobacco: Former Systems developer    Types: Chew  Substance Use Topics  . Alcohol use: Yes    Comment: occ  . Drug use: Yes    Types: Marijuana, Cocaine    Comment: twice a week     Allergies   Patient has no known allergies.   Review of Systems Review of Systems  Constitutional: Negative for chills and fever.  Respiratory: Negative for cough and shortness of breath.   Cardiovascular: Negative for chest pain.  Gastrointestinal: Negative for abdominal pain, blood in stool, constipation, diarrhea, nausea and vomiting.       Positive for abnormal appearing stools.   Endocrine: Positive for polydipsia and polyuria.  Genitourinary: Positive for frequency. Negative for discharge, dysuria, penile pain, penile swelling, scrotal swelling and testicular pain.  All other systems reviewed and are negative.    Physical Exam Updated Vital Signs BP (!) 175/89 (BP Location: Right Arm)   Pulse 93   Temp 98.3 F (36.8 C) (Oral)   Resp 19   Ht 6' (1.829 m)   Wt 99.8 kg   SpO2 96%   BMI 29.84 kg/m    Physical Exam Vitals signs and nursing note reviewed.  Constitutional:      General: He is not in acute distress.    Appearance: He is well-developed. He is not toxic-appearing.  HENT:     Head: Normocephalic and atraumatic.  Eyes:     General:        Right eye: No discharge.        Left eye: No discharge.     Conjunctiva/sclera: Conjunctivae normal.  Neck:     Musculoskeletal: Neck supple.  Cardiovascular:     Rate and Rhythm: Normal rate and regular rhythm.  Pulmonary:     Effort: Pulmonary effort is normal. No respiratory distress.  Breath sounds: Normal breath sounds. No wheezing, rhonchi or rales.  Abdominal:     General: There is no distension.     Palpations: Abdomen is soft.     Tenderness: There is no abdominal tenderness. There is no right CVA tenderness, left CVA tenderness, guarding or rebound.  Skin:    General: Skin is warm and dry.     Findings: No rash.  Neurological:     Mental Status: He is alert.     Comments: Clear speech.   Psychiatric:        Behavior: Behavior normal.    ED Treatments / Results  Labs (all labs ordered are listed, but only abnormal results are displayed) Labs Reviewed  COMPREHENSIVE METABOLIC PANEL - Abnormal; Notable for the following components:      Result Value   Glucose, Bld 370 (*)    BUN 25 (*)    Creatinine, Ser 1.25 (*)    Total Protein 6.2 (*)    All other components within normal limits  URINALYSIS, ROUTINE W REFLEX MICROSCOPIC - Abnormal; Notable for the following components:   Color, Urine STRAW (*)    Glucose, UA >=500 (*)    Hgb urine dipstick SMALL (*)    All other components within normal limits  CBG MONITORING, ED - Abnormal; Notable for the following components:   Glucose-Capillary 355 (*)    All other components within normal limits  CBG MONITORING, ED - Abnormal; Notable for the following components:   Glucose-Capillary 292 (*)    All other components within normal limits  CBG MONITORING, ED -  Abnormal; Notable for the following components:   Glucose-Capillary 159 (*)    All other components within normal limits  URINE CULTURE  OVA + PARASITE EXAM  CBC WITH DIFFERENTIAL/PLATELET    EKG None  Radiology No results found.  Procedures Procedures (including critical care time)  Medications Ordered in ED Medications  sodium chloride 0.9 % bolus 1,000 mL (0 mLs Intravenous Stopped 09/28/18 2026)  sodium chloride 0.9 % bolus 1,000 mL (0 mLs Intravenous Stopped 09/28/18 2241)  insulin aspart (novoLOG) injection 5 Units (5 Units Intravenous Given 09/28/18 2137)    Initial Impression / Assessment and Plan / ED Course  I have reviewed the triage vital signs and the nursing notes.  Pertinent labs & imaging results that were available during my care of the patient were reviewed by me and considered in my medical decision making (see chart for details).   Patient presents w/ polyuria/polydipsia for past few months, also mentions concern for worms in his stool due to a friend having this diagnosis. Patient nontoxic appearing, in no apparent distress, vitals without significant abnormality. Exam is benign. Will evaluate w/ labs including O&P. Fluids administration.   CBC: No leukocytosis or anemia.  CMP: Hyperglycemia to 370, no ketonuria, acidosis, or anion gap elevation- not in DKA. Elevation in creatinine today to 1.25, prior on record 1.01- receiving fluids, PCP recheck.  UA: Glucosuria, no ketonuria, no UTI, small hgb.  O&P: Pending.   Patient ultimately received 2 L of fluids as well as 5 units of insulin with  improvement in his CBG, he also is significant symptomatically improved.  Suspect his symptoms are related to poor control of his type 2 diabetes.  Discussed need for close primary care follow-up for repeat labs and discussion of diabetes management.  Not any concern for worms in his stool, I personally reviewed the stool sample patient brought to the emergency department, no  obvious worm type content, no gross blood or melena, abdomen is nontender without peritoneal signs.  O&P is pending at time of discharge.  Feel it is reasonable to treat with albendazole with primary care follow-up.  Patient is agreement with this. I discussed results, treatment plan, need for follow-up, and return precautions with the patient. Provided opportunity for questions, patient confirmed understanding and is in agreement with plan.    Vitals:   09/28/18 2200 09/28/18 2257  BP: 116/62 139/81  Pulse: 61 79  Resp:  20  Temp:  98.5 F (36.9 C)  SpO2: 95% 96%    Final Clinical Impressions(s) / ED Diagnoses   Final diagnoses:  Hyperglycemia  Elevated serum creatinine    ED Discharge Orders         Ordered    albendazole (ALBENZA) 200 MG tablet     09/28/18 [address] [place] R, PA-C 09/28/18 2312    Pattricia Boss, MD 09/30/18 1432

## 2018-09-28 NOTE — ED Triage Notes (Signed)
Pt is a diabetic and has been thirsty for 2-3 months  With excessive thirst and urination

## 2018-09-28 NOTE — Discharge Instructions (Addendum)
You are seen in the emergency department today for increased thirst and urination as well as concern for worms in your stool.  Your blood sugar was elevated in the ER, this improved with fluids and insulin.  Please be sure to monitor this very closely at home.  Your renal function was also elevated today which may be contributory.  Need to follow-up with your primary care provider very closely in the next 2 to 3 days for a recheck of your blood sugar as well as of your kidney function.  Please call to make an appointment.  Regarding the concern for worms in your stool, we have sent off a test to evaluate for this.  We are covering you for this possible condition with albendazole, a medicine to treat worms in the stool, take 2 tablets today and repeat this dose in 2 weeks.  We have prescribed you new medication(s) today. Discuss the medications prescribed today with your pharmacist as they can have adverse effects and interactions with your other medicines including over the counter and prescribed medications. Seek medical evaluation if you start to experience new or abnormal symptoms after taking one of these medicines, seek care immediately if you start to experience difficulty breathing, feeling of your throat closing, facial swelling, or rash as these could be indications of a more serious allergic reaction   Again follow-up closely with your primary care provider within 3 days.  Return to the ER for new or worsening symptoms including but not limited to fever, inability to keep fluids down, severe abdominal pain, blood in your stool, or any other concerns.

## 2018-09-30 LAB — URINE CULTURE: Culture: NO GROWTH

## 2018-10-01 ENCOUNTER — Encounter (INDEPENDENT_AMBULATORY_CARE_PROVIDER_SITE_OTHER): Payer: Self-pay | Admitting: Physical Medicine and Rehabilitation

## 2018-10-03 LAB — OVA + PARASITE EXAM

## 2018-10-03 LAB — O&P RESULT

## 2018-10-22 ENCOUNTER — Encounter (INDEPENDENT_AMBULATORY_CARE_PROVIDER_SITE_OTHER): Payer: Self-pay | Admitting: Physical Medicine and Rehabilitation

## 2018-11-19 ENCOUNTER — Encounter: Payer: Self-pay | Admitting: Physical Medicine and Rehabilitation

## 2018-11-21 ENCOUNTER — Ambulatory Visit (INDEPENDENT_AMBULATORY_CARE_PROVIDER_SITE_OTHER): Payer: Medicare Other | Admitting: Physical Medicine and Rehabilitation

## 2018-11-21 ENCOUNTER — Other Ambulatory Visit: Payer: Self-pay

## 2018-11-21 ENCOUNTER — Ambulatory Visit: Payer: Self-pay

## 2018-11-21 VITALS — BP 127/70 | HR 83

## 2018-11-21 DIAGNOSIS — M5416 Radiculopathy, lumbar region: Secondary | ICD-10-CM

## 2018-11-21 DIAGNOSIS — M25551 Pain in right hip: Secondary | ICD-10-CM

## 2018-11-21 DIAGNOSIS — M47819 Spondylosis without myelopathy or radiculopathy, site unspecified: Secondary | ICD-10-CM

## 2018-11-21 MED ORDER — BETAMETHASONE SOD PHOS & ACET 6 (3-3) MG/ML IJ SUSP
12.0000 mg | Freq: Once | INTRAMUSCULAR | Status: AC
  Administered 2018-11-21: 12 mg

## 2018-11-21 NOTE — Progress Notes (Signed)
   Numeric Pain Rating Scale and Functional Assessment Average Pain 10   In the last MONTH (on 0-10 scale) has pain interfered with the following?  1. General activity like being  able to carry out your everyday physical activities such as walking, climbing stairs, carrying groceries, or moving a chair?  Rating(8)   +Driver, -BT, -Dye Allergies.  

## 2018-11-29 NOTE — Progress Notes (Signed)
Ryan Farmer - 63 y.o. male MRN [id number]  Date of birth: 1955-11-30  Office Visit Note: Visit Date: 11/21/2018 PCP: Charlott Rakes, MD Referred by: Charlott Rakes, MD  Subjective: Chief Complaint  Patient presents with  . Lower Back - Pain  . Right Leg - Pain   HPI: Ryan Farmer is a 63 y.o. male who comes in today For reevaluation management of chronic worsening severe low back pain and bilateral at times leg pain but mainly right radicular type leg pain.  By way of review his pain started about a year ago without a specific injury.  X-ray imaging has showed facet arthropathy and degenerative changes throughout.  His case is complicated by pretty significant type 2 diabetes which she has been under better control recently.  He continues to get some pain control from his primary care physician Dr. Margarita Rana.  He has tried activity modification and pain patches and other medications.  Currently only taking Tylenol that helps a little bit.  We saw him last year and did complete epidural injection which was very beneficial on the right side.  He had more of an L5 symptom and now somewhat of an L4 symptom.  Assumption has been likely some level of either lateral recess narrowing or central stenosis.  He does not have advanced imaging of the lumbar spine at this point.  He has done well with injection of his been no red flag complaints of focal weakness bowel or bladder changes etc.  No unintended weight loss or fevers chills or night sweats.  Pain is worse with walking and standing better at rest.  Rates his pain over the last several months as a 10 out of 10.  Does not endorse much in the way of tingling or paresthesia but he does get a dysesthesia type sensation.  This does affect his daily activities.  Review of Systems  Constitutional: Negative for chills, fever, malaise/fatigue and weight loss.  HENT: Negative for hearing loss and sinus pain.   Eyes: Negative for blurred vision, double  vision and photophobia.  Respiratory: Negative for cough and shortness of breath.   Cardiovascular: Negative for chest pain, palpitations and leg swelling.  Gastrointestinal: Negative for abdominal pain, nausea and vomiting.  Genitourinary: Negative for flank pain.  Musculoskeletal: Positive for back pain and joint pain. Negative for myalgias.       Right hip and leg pain  Skin: Negative for itching and rash.  Neurological: Negative for tremors, focal weakness and weakness.  Endo/Heme/Allergies: Negative.   Psychiatric/Behavioral: Negative for depression.  All other systems reviewed and are negative.  Otherwise per HPI.  Assessment & Plan: Visit Diagnoses:  1. Lumbar radiculopathy   2. Pain in right hip   3. Spondylosis without myelopathy or radiculopathy     Plan: Findings:  Chronic worsening severe and really limiting right radicular leg pain in a somewhat L4 and L5 distribution.  Last injection which was an L4 transforaminal injection gave him quite a bit of relief for up until just the last month or 2.  We have not obtain MRI of the lumbar spine because he is had no red flag complaints and the injections seem to help greatly.  I think at this point however we are going to repeat the injection today because he is in severe pain but I do want to get an MRI of the lumbar spine depending on how he does over the next couple of weeks.  We can look at that  at any time and try to minimize exposure out to the medical facility stay on the coronavirus.  He will continue with current medications and activity levels.  We talked about increased blood sugar with the injection but he has had these few and far between.  He will continue to follow-up with his primary care physician as well.    Meds & Orders:  Meds ordered this encounter  Medications  . betamethasone acetate-betamethasone sodium phosphate (CELESTONE) injection 12 mg    Orders Placed This Encounter  Procedures  . XR C-ARM NO REPORT   . Epidural Steroid injection    Follow-up: Return if symptoms worsen or fail to improve.   Procedures: No procedures performed  Lumbosacral Transforaminal Epidural Steroid Injection - Sub-Pedicular Approach with Fluoroscopic Guidance  Patient: Ryan Farmer      Date of Birth: 03/04/1956 MRN: [id number] PCP: Charlott Rakes, MD      Visit Date: 11/21/2018   Universal Protocol:    Date/Time: 11/21/2018  Consent Given By: the patient  Position: PRONE  Additional Comments: Vital signs were monitored before and after the procedure. Patient was prepped and draped in the usual sterile fashion. The correct patient, procedure, and site was verified.   Injection Procedure Details:  Procedure Site One Meds Administered:  Meds ordered this encounter  Medications  . betamethasone acetate-betamethasone sodium phosphate (CELESTONE) injection 12 mg    Laterality: Right  Location/Site:  L4-L5  Needle size: 22 G  Needle type: Spinal  Needle Placement: Transforaminal  Findings:    -Comments: Excellent flow of contrast along the nerve and into the epidural space.  Procedure Details: After squaring off the end-plates to get a true AP view, the C-arm was positioned so that an oblique view of the foramen as noted above was visualized. The target area is just inferior to the "nose of the scotty dog" or sub pedicular. The soft tissues overlying this structure were infiltrated with 2-3 ml. of 1% Lidocaine without Epinephrine.  The spinal needle was inserted toward the target using a "trajectory" view along the fluoroscope beam.  Under AP and lateral visualization, the needle was advanced so it did not puncture dura and was located close the 6 O'Clock position of the pedical in AP tracterory. Biplanar projections were used to confirm position. Aspiration was confirmed to be negative for CSF and/or blood. A 1-2 ml. volume of Isovue-250 was injected and flow of contrast was noted at each  level. Radiographs were obtained for documentation purposes.   After attaining the desired flow of contrast documented above, a 0.5 to 1.0 ml test dose of 0.25% Marcaine was injected into each respective transforaminal space.  The patient was observed for 90 seconds post injection.  After no sensory deficits were reported, and normal lower extremity motor function was noted,   the above injectate was administered so that equal amounts of the injectate were placed at each foramen (level) into the transforaminal epidural space.   Additional Comments:  The patient tolerated the procedure well Dressing: 2 x 2 sterile gauze and Band-Aid    Post-procedure details: Patient was observed during the procedure. Post-procedure instructions were reviewed.  Patient left the clinic in stable condition.     Clinical History: No specialty comments available.   He reports that he has been smoking cigarettes. He has a 50.00 pack-year smoking history. He has quit using smokeless tobacco.  His smokeless tobacco use included chew.  Recent Labs    01/15/18 0852 08/07/18 1040  HGBA1C  7.9* 10.4*    Objective:  VS:  HT:    WT:   BMI:     BP:127/70  HR:83bpm  TEMP: ( )  RESP:  Physical Exam Vitals signs and nursing note reviewed.  Constitutional:      General: He is not in acute distress.    Appearance: He is well-developed.     Comments: Somewhat disheveled appearance  HENT:     Head: Normocephalic and atraumatic.     Nose: Nose normal.     Mouth/Throat:     Mouth: Mucous membranes are moist.     Pharynx: Oropharynx is clear.  Eyes:     Conjunctiva/sclera: Conjunctivae normal.     Pupils: Pupils are equal, round, and reactive to light.  Neck:     Musculoskeletal: Normal range of motion and neck supple.     Trachea: No tracheal deviation.  Cardiovascular:     Rate and Rhythm: Normal rate and regular rhythm.     Pulses: Normal pulses.  Pulmonary:     Effort: Pulmonary effort is normal.      Breath sounds: Normal breath sounds.  Abdominal:     General: There is no distension.     Palpations: Abdomen is soft.     Tenderness: There is no guarding or rebound.  Musculoskeletal:        General: No deformity.     Right lower leg: No edema.     Left lower leg: No edema.     Comments: Patient ambulates without aid he is slow to rise from seated position to full extension.  He does have pain with extension and facet loading of the low back.  No pain with hip rotation good distal strength without clonus.  No pain over the greater trochanters.  Skin:    General: Skin is warm and dry.     Findings: No erythema or rash.  Neurological:     General: No focal deficit present.     Mental Status: He is alert and oriented to person, place, and time.     Sensory: No sensory deficit.     Motor: No weakness or abnormal muscle tone.     Coordination: Coordination normal.     Gait: Gait abnormal.  Psychiatric:        Mood and Affect: Mood normal.        Behavior: Behavior normal.        Thought Content: Thought content normal.     Ortho Exam Imaging: No results found.  Past Medical/Family/Surgical/Social History: Medications & Allergies reviewed per EMR, new medications updated. Patient Active Problem List   Diagnosis Date Noted  . Spondylosis without myelopathy or radiculopathy, lumbar region 04/26/2018  . Cocaine abuse ([place]) 12/19/2016  . Shingles outbreak 12/13/2016  . Diabetes type 2, uncontrolled ([place]) 04/19/2016  . Metatarsalgia 10/19/2015  . Back muscle spasm 11/18/2013  . Hyperlipidemia 11/18/2013  . HTN (hypertension) 11/22/2012  . Chronic pain syndrome 11/22/2012  . Tobacco use disorder 11/22/2012  . Dental caries 11/22/2012   Past Medical History:  Diagnosis Date  . Arthritis    All over  . Chronic pain syndrome   . Diabetes mellitus   . History of cocaine use   . History of kidney stones   . Hypercholesteremia   . Hypertension   . Low back pain   . Lumbar  radiculopathy   . Right leg pain   . Seborrheic dermatitis of scalp   . Shingles    Family History  Problem Relation Age of Onset  . Cancer Mother   . Stroke Father   . Heart disease Father   . Diabetes Father    Past Surgical History:  Procedure Laterality Date  . CIRCUMCISION N/A 03/16/2018   Procedure: CIRCUMCISION ADULT WITH PENILE BLOCK;  Surgeon: Alexis Frock, MD;  Location: [facility];  Service: Urology;  Laterality: N/A;  71 MINS  . KNEE ARTHROSCOPY Bilateral    Social History   Occupational History  . Not on file  Tobacco Use  . Smoking status: Current Every Day Smoker    Packs/day: 1.00    Years: 50.00    Pack years: 50.00    Types: Cigarettes  . Smokeless tobacco: Former Systems developer    Types: Chew  Substance and Sexual Activity  . Alcohol use: Yes    Comment: occ  . Drug use: Yes    Types: Marijuana, Cocaine    Comment: twice a week  . Sexual activity: Not on file

## 2018-11-29 NOTE — Procedures (Signed)
Lumbosacral Transforaminal Epidural Steroid Injection - Sub-Pedicular Approach with Fluoroscopic Guidance  Patient: Ryan Farmer      Date of Birth: 01/22/1956 MRN: [id number] PCP: Charlott Rakes, MD      Visit Date: 11/21/2018   Universal Protocol:    Date/Time: 11/21/2018  Consent Given By: the patient  Position: PRONE  Additional Comments: Vital signs were monitored before and after the procedure. Patient was prepped and draped in the usual sterile fashion. The correct patient, procedure, and site was verified.   Injection Procedure Details:  Procedure Site One Meds Administered:  Meds ordered this encounter  Medications  . betamethasone acetate-betamethasone sodium phosphate (CELESTONE) injection 12 mg    Laterality: Right  Location/Site:  L4-L5  Needle size: 22 G  Needle type: Spinal  Needle Placement: Transforaminal  Findings:    -Comments: Excellent flow of contrast along the nerve and into the epidural space.  Procedure Details: After squaring off the end-plates to get a true AP view, the C-arm was positioned so that an oblique view of the foramen as noted above was visualized. The target area is just inferior to the "nose of the scotty dog" or sub pedicular. The soft tissues overlying this structure were infiltrated with 2-3 ml. of 1% Lidocaine without Epinephrine.  The spinal needle was inserted toward the target using a "trajectory" view along the fluoroscope beam.  Under AP and lateral visualization, the needle was advanced so it did not puncture dura and was located close the 6 O'Clock position of the pedical in AP tracterory. Biplanar projections were used to confirm position. Aspiration was confirmed to be negative for CSF and/or blood. A 1-2 ml. volume of Isovue-250 was injected and flow of contrast was noted at each level. Radiographs were obtained for documentation purposes.   After attaining the desired flow of contrast documented above, a 0.5 to  1.0 ml test dose of 0.25% Marcaine was injected into each respective transforaminal space.  The patient was observed for 90 seconds post injection.  After no sensory deficits were reported, and normal lower extremity motor function was noted,   the above injectate was administered so that equal amounts of the injectate were placed at each foramen (level) into the transforaminal epidural space.   Additional Comments:  The patient tolerated the procedure well Dressing: 2 x 2 sterile gauze and Band-Aid    Post-procedure details: Patient was observed during the procedure. Post-procedure instructions were reviewed.  Patient left the clinic in stable condition.

## 2018-12-14 ENCOUNTER — Telehealth: Payer: Self-pay | Admitting: Physical Medicine and Rehabilitation

## 2018-12-14 DIAGNOSIS — M5416 Radiculopathy, lumbar region: Secondary | ICD-10-CM

## 2018-12-14 NOTE — Telephone Encounter (Signed)
Please, diagnoses on their, radic is main one

## 2018-12-14 NOTE — Telephone Encounter (Signed)
Called patient to advise that MRI has been ordered.

## 2018-12-14 NOTE — Telephone Encounter (Signed)
MRI ordered

## 2019-01-04 ENCOUNTER — Ambulatory Visit
Admission: RE | Admit: 2019-01-04 | Discharge: 2019-01-04 | Disposition: A | Discharge status: Home / Self Care | Payer: Medicare Other | Source: Ambulatory Visit | Attending: Physical Medicine and Rehabilitation | Admitting: Physical Medicine and Rehabilitation

## 2019-01-04 ENCOUNTER — Other Ambulatory Visit: Payer: Self-pay

## 2019-01-04 DIAGNOSIS — M48061 Spinal stenosis, lumbar region without neurogenic claudication: Secondary | ICD-10-CM | POA: Diagnosis not present

## 2019-01-04 DIAGNOSIS — M5416 Radiculopathy, lumbar region: Secondary | ICD-10-CM

## 2019-01-08 ENCOUNTER — Inpatient Hospital Stay (HOSPITAL_COMMUNITY): Payer: Medicare Other

## 2019-01-08 ENCOUNTER — Ambulatory Visit (HOSPITAL_COMMUNITY): Admit: 2019-01-08 | Payer: Medicare Other | Admitting: Cardiovascular Disease

## 2019-01-08 ENCOUNTER — Inpatient Hospital Stay (HOSPITAL_COMMUNITY)
Admission: EM | Admit: 2019-01-08 | Discharge: 2019-01-09 | DRG: 287 | Disposition: A | Discharge status: Home / Self Care | Payer: Medicare Other | Attending: Cardiovascular Disease | Admitting: Cardiovascular Disease

## 2019-01-08 ENCOUNTER — Inpatient Hospital Stay (HOSPITAL_COMMUNITY)
Admission: EM | Disposition: A | Discharge status: Home / Self Care | Payer: Self-pay | Source: Home / Self Care | Attending: Cardiovascular Disease

## 2019-01-08 DIAGNOSIS — R0902 Hypoxemia: Secondary | ICD-10-CM | POA: Diagnosis not present

## 2019-01-08 DIAGNOSIS — E1165 Type 2 diabetes mellitus with hyperglycemia: Secondary | ICD-10-CM

## 2019-01-08 DIAGNOSIS — Z833 Family history of diabetes mellitus: Secondary | ICD-10-CM

## 2019-01-08 DIAGNOSIS — R55 Syncope and collapse: Secondary | ICD-10-CM

## 2019-01-08 DIAGNOSIS — I499 Cardiac arrhythmia, unspecified: Secondary | ICD-10-CM | POA: Diagnosis not present

## 2019-01-08 DIAGNOSIS — E119 Type 2 diabetes mellitus without complications: Secondary | ICD-10-CM | POA: Diagnosis present

## 2019-01-08 DIAGNOSIS — I959 Hypotension, unspecified: Secondary | ICD-10-CM | POA: Diagnosis present

## 2019-01-08 DIAGNOSIS — E1129 Type 2 diabetes mellitus with other diabetic kidney complication: Secondary | ICD-10-CM

## 2019-01-08 DIAGNOSIS — R062 Wheezing: Secondary | ICD-10-CM | POA: Diagnosis not present

## 2019-01-08 DIAGNOSIS — I2510 Atherosclerotic heart disease of native coronary artery without angina pectoris: Principal | ICD-10-CM | POA: Diagnosis present

## 2019-01-08 DIAGNOSIS — IMO0002 Reserved for concepts with insufficient information to code with codable children: Secondary | ICD-10-CM | POA: Diagnosis present

## 2019-01-08 DIAGNOSIS — I10 Essential (primary) hypertension: Secondary | ICD-10-CM | POA: Diagnosis present

## 2019-01-08 DIAGNOSIS — E7800 Pure hypercholesterolemia, unspecified: Secondary | ICD-10-CM | POA: Diagnosis present

## 2019-01-08 DIAGNOSIS — Z1159 Encounter for screening for other viral diseases: Secondary | ICD-10-CM | POA: Diagnosis not present

## 2019-01-08 DIAGNOSIS — Z794 Long term (current) use of insulin: Secondary | ICD-10-CM

## 2019-01-08 DIAGNOSIS — F17210 Nicotine dependence, cigarettes, uncomplicated: Secondary | ICD-10-CM | POA: Diagnosis present

## 2019-01-08 DIAGNOSIS — Z79899 Other long term (current) drug therapy: Secondary | ICD-10-CM | POA: Diagnosis not present

## 2019-01-08 DIAGNOSIS — F1410 Cocaine abuse, uncomplicated: Secondary | ICD-10-CM | POA: Diagnosis present

## 2019-01-08 DIAGNOSIS — G894 Chronic pain syndrome: Secondary | ICD-10-CM | POA: Diagnosis present

## 2019-01-08 DIAGNOSIS — I213 ST elevation (STEMI) myocardial infarction of unspecified site: Secondary | ICD-10-CM | POA: Diagnosis not present

## 2019-01-08 DIAGNOSIS — F17200 Nicotine dependence, unspecified, uncomplicated: Secondary | ICD-10-CM | POA: Diagnosis present

## 2019-01-08 HISTORY — PX: LEFT HEART CATH AND CORONARY ANGIOGRAPHY: CATH118249

## 2019-01-08 LAB — CBC
HCT: 49.2 % (ref 39.0–52.0)
HCT: 48.9 % (ref 39.0–52.0)
Hemoglobin: 16.1 g/dL (ref 13.0–17.0)
Hemoglobin: 15.6 g/dL (ref 13.0–17.0)
MCH: 28.6 pg (ref 26.0–34.0)
MCH: 28 pg (ref 26.0–34.0)
MCHC: 32.7 g/dL (ref 30.0–36.0)
MCHC: 31.9 g/dL (ref 30.0–36.0)
MCV: 87.4 fL (ref 80.0–100.0)
MCV: 87.8 fL (ref 80.0–100.0)
Platelets: 201 10*3/uL (ref 150–400)
Platelets: 177 10*3/uL (ref 150–400)
RBC: 5.63 MIL/uL (ref 4.22–5.81)
RBC: 5.57 MIL/uL (ref 4.22–5.81)
RDW: 13.6 % (ref 11.5–15.5)
RDW: 13.7 % (ref 11.5–15.5)
WBC: 11.1 10*3/uL — ABNORMAL HIGH (ref 4.0–10.5)
WBC: 12.1 10*3/uL — ABNORMAL HIGH (ref 4.0–10.5)
nRBC: 0 % (ref 0.0–0.2)
nRBC: 0 % (ref 0.0–0.2)

## 2019-01-08 LAB — COMPREHENSIVE METABOLIC PANEL
ALT: 24 U/L (ref 0–44)
ALT: 24 U/L (ref 0–44)
AST: 22 U/L (ref 15–41)
AST: 18 U/L (ref 15–41)
Albumin: 3.7 g/dL (ref 3.5–5.0)
Albumin: 3.4 g/dL — ABNORMAL LOW (ref 3.5–5.0)
Alkaline Phosphatase: 65 U/L (ref 38–126)
Alkaline Phosphatase: 60 U/L (ref 38–126)
Anion gap: 14 (ref 5–15)
Anion gap: 13 (ref 5–15)
BUN: 30 mg/dL — ABNORMAL HIGH (ref 8–23)
BUN: 28 mg/dL — ABNORMAL HIGH (ref 8–23)
CO2: 19 mmol/L — ABNORMAL LOW (ref 22–32)
CO2: 17 mmol/L — ABNORMAL LOW (ref 22–32)
Calcium: 9.1 mg/dL (ref 8.9–10.3)
Calcium: 8.9 mg/dL (ref 8.9–10.3)
Chloride: 103 mmol/L (ref 98–111)
Chloride: 104 mmol/L (ref 98–111)
Creatinine, Ser: 2.12 mg/dL — ABNORMAL HIGH (ref 0.61–1.24)
Creatinine, Ser: 1.62 mg/dL — ABNORMAL HIGH (ref 0.61–1.24)
GFR calc Af Amer: 38 mL/min — ABNORMAL LOW (ref >60)
GFR calc Af Amer: 52 mL/min — ABNORMAL LOW (ref >60)
GFR calc non Af Amer: 33 mL/min — ABNORMAL LOW (ref >60)
GFR calc non Af Amer: 45 mL/min — ABNORMAL LOW (ref >60)
Glucose, Bld: 276 mg/dL — ABNORMAL HIGH (ref 70–99)
Glucose, Bld: 424 mg/dL — ABNORMAL HIGH (ref 70–99)
Potassium: 3.8 mmol/L (ref 3.5–5.1)
Potassium: 4.2 mmol/L (ref 3.5–5.1)
Sodium: 136 mmol/L (ref 135–145)
Sodium: 134 mmol/L — ABNORMAL LOW (ref 135–145)
Total Bilirubin: 0.5 mg/dL (ref 0.3–1.2)
Total Bilirubin: 0.4 mg/dL (ref 0.3–1.2)
Total Protein: 6.1 g/dL — ABNORMAL LOW (ref 6.5–8.1)
Total Protein: 5.7 g/dL — ABNORMAL LOW (ref 6.5–8.1)

## 2019-01-08 LAB — LIPID PANEL
Cholesterol: 226 mg/dL — ABNORMAL HIGH (ref 0–200)
Cholesterol: 208 mg/dL — ABNORMAL HIGH (ref 0–200)
HDL: 30 mg/dL — ABNORMAL LOW (ref >40)
HDL: 30 mg/dL — ABNORMAL LOW (ref >40)
LDL Cholesterol: UNDETERMINED mg/dL (ref 0–99)
LDL Cholesterol: UNDETERMINED mg/dL (ref 0–99)
Total CHOL/HDL Ratio: 7.5 RATIO
Total CHOL/HDL Ratio: 6.9 RATIO
Triglycerides: 471 mg/dL — ABNORMAL HIGH (ref <150)
Triglycerides: 535 mg/dL — ABNORMAL HIGH (ref <150)
VLDL: UNDETERMINED mg/dL (ref 0–40)
VLDL: UNDETERMINED mg/dL (ref 0–40)

## 2019-01-08 LAB — HEMOGLOBIN A1C
Hgb A1c MFr Bld: 12.6 % — ABNORMAL HIGH (ref 4.8–5.6)
Hgb A1c MFr Bld: 12.5 % — ABNORMAL HIGH (ref 4.8–5.6)
Mean Plasma Glucose: 314.92 mg/dL
Mean Plasma Glucose: 312.05 mg/dL

## 2019-01-08 LAB — PROTIME-INR
INR: 1 (ref 0.8–1.2)
INR: 1 (ref 0.8–1.2)
Prothrombin Time: 12.9 seconds (ref 11.4–15.2)
Prothrombin Time: 13.3 seconds (ref 11.4–15.2)

## 2019-01-08 LAB — TROPONIN I (HIGH SENSITIVITY)
Troponin I (High Sensitivity): 7 ng/L (ref <18)
Troponin I (High Sensitivity): 5 ng/L (ref <18)

## 2019-01-08 LAB — LDL CHOLESTEROL, DIRECT
Direct LDL: 122.8 mg/dL — ABNORMAL HIGH (ref 0–99)
Direct LDL: 118.6 mg/dL — ABNORMAL HIGH (ref 0–99)

## 2019-01-08 LAB — GLUCOSE, CAPILLARY
Glucose-Capillary: 292 mg/dL — ABNORMAL HIGH (ref 70–99)
Glucose-Capillary: 437 mg/dL — ABNORMAL HIGH (ref 70–99)
Glucose-Capillary: 414 mg/dL — ABNORMAL HIGH (ref 70–99)

## 2019-01-08 LAB — APTT
aPTT: 33 seconds (ref 24–36)
aPTT: 34 seconds (ref 24–36)

## 2019-01-08 LAB — MRSA PCR SCREENING: MRSA by PCR: NEGATIVE

## 2019-01-08 LAB — SARS CORONAVIRUS 2 BY RT PCR (HOSPITAL ORDER, PERFORMED IN ~~LOC~~ HOSPITAL LAB): SARS Coronavirus 2: NEGATIVE

## 2019-01-08 SURGERY — LEFT HEART CATH AND CORONARY ANGIOGRAPHY
Anesthesia: LOCAL

## 2019-01-08 MED ORDER — IOHEXOL 350 MG/ML SOLN
INTRAVENOUS | Status: DC | PRN
  Administered 2019-01-08: 70 mL via INTRA_ARTERIAL

## 2019-01-08 MED ORDER — NITROGLYCERIN 1 MG/10 ML FOR IR/CATH LAB
INTRA_ARTERIAL | Status: AC
  Filled 2019-01-08: qty 10

## 2019-01-08 MED ORDER — LIDOCAINE HCL (PF) 1 % IJ SOLN
INTRAMUSCULAR | Status: DC | PRN
  Administered 2019-01-08: 2 mL

## 2019-01-08 MED ORDER — ONDANSETRON HCL 4 MG/2ML IJ SOLN: 4.0000 mg | Freq: Four times a day (QID) | INTRAMUSCULAR | Status: DC | PRN

## 2019-01-08 MED ORDER — VERAPAMIL HCL 2.5 MG/ML IV SOLN
INTRAVENOUS | Status: DC | PRN
  Administered 2019-01-08: 10 mL via INTRA_ARTERIAL

## 2019-01-08 MED ORDER — LABETALOL HCL 5 MG/ML IV SOLN: 10.0000 mg | INTRAVENOUS | Status: AC | PRN

## 2019-01-08 MED ORDER — NOREPINEPHRINE 4 MG/250ML-% IV SOLN
INTRAVENOUS | Status: AC
  Filled 2019-01-08: qty 250

## 2019-01-08 MED ORDER — SODIUM CHLORIDE 0.9 % IV SOLN
INTRAVENOUS | Status: AC | PRN
  Administered 2019-01-08: 200 mL/h via INTRAVENOUS

## 2019-01-08 MED ORDER — ACETAMINOPHEN 325 MG PO TABS: 650.0000 mg | ORAL_TABLET | ORAL | Status: DC | PRN

## 2019-01-08 MED ORDER — SODIUM CHLORIDE 0.9% FLUSH: 3.0000 mL | INTRAVENOUS | Status: DC | PRN

## 2019-01-08 MED ORDER — SODIUM CHLORIDE 0.9 % IV SOLN
INTRAVENOUS | Status: DC
  Administered 2019-01-08: 21:00:00 1 mL via INTRAVENOUS
  Administered 2019-01-08 – 2019-01-09 (×2): via INTRAVENOUS

## 2019-01-08 MED ORDER — SODIUM CHLORIDE 0.9 % IV SOLN: 250.0000 mL | INTRAVENOUS | Status: DC | PRN

## 2019-01-08 MED ORDER — HEPARIN (PORCINE) IN NACL 1000-0.9 UT/500ML-% IV SOLN
INTRAVENOUS | Status: AC
  Filled 2019-01-08: qty 1000

## 2019-01-08 MED ORDER — INSULIN ASPART 100 UNIT/ML ~~LOC~~ SOLN
0.0000 [IU] | Freq: Three times a day (TID) | SUBCUTANEOUS | Status: DC
  Administered 2019-01-08: 19:00:00 15 [IU] via SUBCUTANEOUS
  Administered 2019-01-09: 3 [IU] via SUBCUTANEOUS
  Administered 2019-01-09: 5 [IU] via SUBCUTANEOUS

## 2019-01-08 MED ORDER — INSULIN GLARGINE 100 UNIT/ML ~~LOC~~ SOLN
30.0000 [IU] | Freq: Every day | SUBCUTANEOUS | Status: DC
  Administered 2019-01-08 – 2019-01-09 (×2): 30 [IU] via SUBCUTANEOUS
  Filled 2019-01-08 (×2): qty 0.3

## 2019-01-08 MED ORDER — SODIUM CHLORIDE 0.9% FLUSH
3.0000 mL | Freq: Two times a day (BID) | INTRAVENOUS | Status: DC
  Administered 2019-01-08 – 2019-01-09 (×2): 3 mL via INTRAVENOUS

## 2019-01-08 MED ORDER — NOREPINEPHRINE BITARTRATE 1 MG/ML IV SOLN
INTRAVENOUS | Status: AC | PRN
  Administered 2019-01-08: 10 ug/min via INTRAVENOUS

## 2019-01-08 MED ORDER — ASPIRIN 81 MG PO CHEW
81.0000 mg | CHEWABLE_TABLET | Freq: Every day | ORAL | Status: DC
  Administered 2019-01-08 – 2019-01-09 (×2): 81 mg via ORAL
  Filled 2019-01-08 (×2): qty 1

## 2019-01-08 MED ORDER — GABAPENTIN 300 MG PO CAPS: 300.0000 mg | ORAL_CAPSULE | Freq: Two times a day (BID) | ORAL | Status: DC | PRN

## 2019-01-08 MED ORDER — TRAMADOL HCL 50 MG PO TABS
50.0000 mg | ORAL_TABLET | Freq: Four times a day (QID) | ORAL | Status: DC | PRN
  Administered 2019-01-09: 50 mg via ORAL
  Filled 2019-01-08: qty 1

## 2019-01-08 MED ORDER — VERAPAMIL HCL 2.5 MG/ML IV SOLN
INTRAVENOUS | Status: AC
  Filled 2019-01-08: qty 2

## 2019-01-08 MED ORDER — LIDOCAINE HCL (PF) 1 % IJ SOLN
INTRAMUSCULAR | Status: AC
  Filled 2019-01-08: qty 30

## 2019-01-08 MED ORDER — HYDRALAZINE HCL 20 MG/ML IJ SOLN: 10.0000 mg | INTRAMUSCULAR | Status: AC | PRN

## 2019-01-08 MED ORDER — ATORVASTATIN CALCIUM 80 MG PO TABS
80.0000 mg | ORAL_TABLET | Freq: Every day | ORAL | Status: DC
  Administered 2019-01-08: 80 mg via ORAL
  Filled 2019-01-08: qty 1

## 2019-01-08 SURGICAL SUPPLY — 14 items
BAG SNAP BAND KOVER 36X36 (MISCELLANEOUS) ×1 IMPLANT
CATH INFINITI 5FR ANG PIGTAIL (CATHETERS) ×1 IMPLANT
CATH OPTITORQUE TIG 4.0 5F (CATHETERS) ×1 IMPLANT
CATH VISTA GUIDE 6FR JR4 (CATHETERS) ×1 IMPLANT
COVER DOME SNAP 22 D (MISCELLANEOUS) ×1 IMPLANT
DEVICE RAD COMP TR BAND LRG (VASCULAR PRODUCTS) ×1 IMPLANT
GLIDESHEATH SLEND SS 6F .021 (SHEATH) ×1 IMPLANT
GUIDEWIRE INQWIRE 1.5J.035X260 (WIRE) IMPLANT
INQWIRE 1.5J .035X260CM (WIRE) ×2
KIT ENCORE 26 ADVANTAGE (KITS) ×1 IMPLANT
KIT HEART LEFT (KITS) ×2 IMPLANT
PACK CARDIAC CATHETERIZATION (CUSTOM PROCEDURE TRAY) ×2 IMPLANT
TRANSDUCER W/STOPCOCK (MISCELLANEOUS) ×2 IMPLANT
TUBING CIL FLEX 10 FLL-RA (TUBING) ×2 IMPLANT

## 2019-01-08 NOTE — Progress Notes (Signed)
cbg 437. Cards on-call provider paged. Orders for Lantus now and cover with [employer].
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
## 2019-01-09 ENCOUNTER — Other Ambulatory Visit: Payer: Self-pay

## 2019-01-09 ENCOUNTER — Telehealth: Payer: Self-pay | Admitting: Cardiovascular Disease

## 2019-01-09 ENCOUNTER — Encounter (HOSPITAL_COMMUNITY): Payer: Self-pay | Admitting: Cardiovascular Disease

## 2019-01-09 ENCOUNTER — Inpatient Hospital Stay (HOSPITAL_COMMUNITY): Payer: Medicare Other

## 2019-01-09 DIAGNOSIS — I2510 Atherosclerotic heart disease of native coronary artery without angina pectoris: Secondary | ICD-10-CM

## 2019-01-09 DIAGNOSIS — I213 ST elevation (STEMI) myocardial infarction of unspecified site: Secondary | ICD-10-CM

## 2019-01-09 DIAGNOSIS — F1410 Cocaine abuse, uncomplicated: Secondary | ICD-10-CM

## 2019-01-09 DIAGNOSIS — R55 Syncope and collapse: Secondary | ICD-10-CM

## 2019-01-09 HISTORY — DX: Atherosclerotic heart disease of native coronary artery without angina pectoris: I25.10

## 2019-01-09 LAB — GLUCOSE, CAPILLARY
Glucose-Capillary: 200 mg/dL — ABNORMAL HIGH (ref 70–99)
Glucose-Capillary: 219 mg/dL — ABNORMAL HIGH (ref 70–99)

## 2019-01-09 LAB — ECHOCARDIOGRAM COMPLETE: Weight: 3414.48 oz

## 2019-01-09 MED ORDER — INSULIN GLARGINE 100 UNIT/ML SOLOSTAR PEN: 35.0000 [IU] | PEN_INJECTOR | Freq: Every day | SUBCUTANEOUS | 5 refills | Status: DC

## 2019-01-09 MED ORDER — ASPIRIN 81 MG PO CHEW: 81.0000 mg | CHEWABLE_TABLET | Freq: Every day | ORAL | Status: DC

## 2019-01-09 MED ORDER — METFORMIN HCL 1000 MG PO TABS: 1000.0000 mg | ORAL_TABLET | Freq: Two times a day (BID) | ORAL | 6 refills | Status: DC

## 2019-01-09 MED ORDER — CHLORHEXIDINE GLUCONATE CLOTH 2 % EX PADS
6.0000 | MEDICATED_PAD | Freq: Every day | CUTANEOUS | Status: DC
  Administered 2019-01-09: 6 via TOPICAL

## 2019-01-09 MED FILL — Nitroglycerin IV Soln 100 MCG/ML in D5W: INTRA_ARTERIAL | Qty: 10 | Status: AC

## 2019-01-09 MED FILL — Heparin Sod (Porcine)-NaCl IV Soln 1000 Unit/500ML-0.9%: INTRAVENOUS | Qty: 500 | Status: AC

## 2019-01-09 NOTE — Plan of Care (Signed)
  Problem: Clinical Measurements: Goal: Diagnostic test results will improve Outcome: Progressing   Problem: Clinical Measurements: Goal: Cardiovascular complication will be avoided Outcome: Progressing

## 2019-01-09 NOTE — Progress Notes (Addendum)
Inpatient Diabetes Program Recommendations  AACE/ADA: New Consensus Statement on Inpatient Glycemic Control (2015)  Target Ranges:  Prepandial:   less than 140 mg/dL      Peak postprandial:   less than 180 mg/dL (1-2 hours)      Critically ill patients:  140 - 180 mg/dL   Lab Results  Component Value Date   GLUCAP 200 (H) 01/09/2019   HGBA1C 12.5 (H) 01/08/2019    Review of Glycemic Control  Diabetes history: DM 2 Outpatient Diabetes medications: Lantus 30 units Daily, Metformin 1000 mg bid Current orders for Inpatient glycemic control: Lantus 30 units, Novolog 0-15 units tid  Inpatient Diabetes Program Recommendations:    Fasting glucose 200 mg/dl. Consider increasing Lantus to 35 units.  Noted A1c 12.5%. Patient gets steroid injections in back every 2-3 months. Will need close follow up and titration by PCP. Patient followed by [facility] from [facility], last visit 08/07/18. And had instructions to titrate Lantus 2 units every 3-4 days until fasting blood glucose is less than 200.  Needs PCP follow up post hospitalization for DM control.   Addendum 1320 pm: Spoke with patient at bedside regarding A1c value of 12.5% this admission. Discussed glucose and A1c goals. Patient goes to [facility]. Discussed parameters MD wrote patient in titrating basal insulin and discussed when to call the PCP for titration. Patient has insulin and plenty of DM supplies.  Patient reports following a DM diet. Patient understands information discussed.  Thanks,  Tama Headings RN, MSN, BC-ADM Inpatient Diabetes Coordinator Team Pager [phone] (8a-5p)

## 2019-01-09 NOTE — Plan of Care (Signed)
  Problem: Education: Goal: Knowledge of General Education information will improve Description: Including pain rating scale, medication(s)/side effects and non-pharmacologic comfort measures Outcome: Completed/Met   Problem: Health Behavior/Discharge Planning: Goal: Ability to manage health-related needs will improve Outcome: Completed/Met   Problem: Clinical Measurements: Goal: Ability to maintain clinical measurements within normal limits will improve Outcome: Completed/Met Goal: Will remain free from infection Outcome: Completed/Met Goal: Diagnostic test results will improve Outcome: Completed/Met Goal: Respiratory complications will improve Outcome: Completed/Met Goal: Cardiovascular complication will be avoided Outcome: Completed/Met   Problem: Activity: Goal: Risk for activity intolerance will decrease Outcome: Completed/Met   Problem: Nutrition: Goal: Adequate nutrition will be maintained Outcome: Completed/Met   Problem: Coping: Goal: Level of anxiety will decrease Outcome: Completed/Met   Problem: Elimination: Goal: Will not experience complications related to bowel motility Outcome: Completed/Met Goal: Will not experience complications related to urinary retention Outcome: Completed/Met   Problem: Pain Managment: Goal: General experience of comfort will improve Outcome: Completed/Met   Problem: Safety: Goal: Ability to remain free from injury will improve Outcome: Completed/Met   Problem: Safety: Goal: Ability to remain free from injury will improve Outcome: Completed/Met   Problem: Skin Integrity: Goal: Risk for impaired skin integrity will decrease Outcome: Completed/Met

## 2019-01-09 NOTE — Progress Notes (Addendum)
TR band taken off patient R arm with no complications or bleeding. Site is level 0 with tergaderm dressing placed. Education given to patient about site and dressing and patient verbalized understanding.

## 2019-01-09 NOTE — Discharge Instructions (Signed)
Call [facility] at [phone] if any bleeding, swelling or drainage at cath site.  May shower, no tub baths for 48 hours for groin sticks. No lifting over 5 pounds for 3 days.  No Driving for 3 days  Stop tobacco and any drugs not ordered by MD  Hold your metformin until 01/11/19 - it may interact with cath dye.   We increased your lantus to 35 units.  Please see PCP as soon as possible.    We did add ASA 81 mg daily  Diabetic diet is very important.   If your glucose is above 200 increase your Lantus dose 2 units every 3-4 days until your glucose first thing in the morning is below 200. Call your PCP if your glucose is consistently above 200 all the time OR below 70 all the time.

## 2019-01-09 NOTE — Telephone Encounter (Signed)
New Message   Patient has a TOC scheduled with Jory Sims 7/8 10:45AM

## 2019-01-09 NOTE — Telephone Encounter (Signed)
Patient is still admitted to hospital 01/08/19

## 2019-01-09 NOTE — Discharge Summary (Signed)
Discharge Summary    Patient ID: Ryan Farmer MRN: [id number]; DOB: Nov 08, 1955  Admit date: 01/08/2019 Discharge date: 01/09/2019  Primary Care Provider: Charlott Rakes, MD  Primary Cardiologist: Ryan Majestic, MD  Primary Electrophysiologist:  None  Discharge Diagnoses    Active Problems:   HTN (hypertension)   Tobacco use disorder   Diabetes type 2, uncontrolled (Ryan Farmer)   Cocaine abuse (Ryan Farmer)   Syncope and collapse   CAD (coronary artery disease), native coronary artery, minimal by cardiac cath 01/08/19   Allergies No Known Allergies  Diagnostic Studies/Procedures    Cardiac cath 01/08/19 Ryan Farmer Conclusion    Prox RCA-1 lesion is 20% stenosed.  Prox RCA-2 lesion is 20% stenosed.  Dist RCA lesion is 20% stenosed.  Dist LAD lesion is 10% stenosed.  Mid LAD lesion is 20% stenosed.  The left ventricular systolic function is normal.  LV end diastolic pressure is normal.   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  The left circumflex coronary artery is normal.  The right coronary artery has mild 20% proximal mid stenosis and a small area of muscle bridging in the distal vessel proximal to the PDA takeoff.  Normal LV function with EF 55% without focal segmental wall motion abnormalities.  LVEDP 14 mm.  RECOMMENDATION: The patient denied any chest pain prior to his syncopal spell.  He had transient 1 mm inferior ST elevation with resolution on subsequent ECG suggesting potential transient spasm.  Patient was hypotensive requiring fluid bolus and levophed.  At the completion of the procedure blood pressure was 111/67 on a reduced dose of Levophed with plans to wean as blood pressure allows.   Recommendations  Antiplatelet/Anticoag Recommend Aspirin 37m daily for moderate CAD.  IWall Motion  Resting                 Left Heart  Left Ventricle The left ventricular size is normal. The left ventricular systolic function is normal. LV end diastolic pressure is normal. No regional wall motion abnormalities. Preserved global LV contractility with EF estimated 55%.  There were no focal segmental wall motion abnormalities.  LVEDP is 14 mmHg  Coronary Diagrams  Diagnostic Dominance: Right   _____________   History of Present Illness     63y.o. male with PMH of HTN, HL, DM, chronic pain, Hx of cocaine use and current tobacco use who had a syncopal episode in field.  Pt's co worker stated pt passed out in seat, pt notes he did feel dizzy prior to that time.   EMS with EKG with SR and 1 mm ST Elevation in inf leads, code STEMI called.  He was given ASA 324 mg and taken to the cath lab.   Initial labs with Cr 2.12  Troponin HS 7  Hgb 16.1   No chest pain prior to event.    Hospital Course     Consultants: none  In cath lab pt found to have resolved ST elevation.  Cardiac cath with minimal CAD and normal EF. Possible spasm.  Pt with hx of cocaine.   He was hypotensive in cath lab and fluids given.  Continue ASA EKG today is SR normal EKG - no EKGs from yesterday in chart.   CT of the head was neg.    Pt's hgb A1c is 12.6 - he is on insulin and have asked  him to see PCP ASAP.  His LDL is elevated and should continue statin.    Cr was 2.12 on admit but with fluids down to 1.62 , normally 1.25 or less.    DM will discharge with higher dose of lantus.  He will hold metformin until 7.3.20 and resume.   Dr. Margaretann Farmer has seen and examined and found stable for discharge.    _____________  Discharge Vitals Blood pressure 91/77, pulse 64, temperature 97.8 F (36.6 C), temperature source Oral, resp. rate 16, height 6' (1.829 m), weight 96.8 kg, SpO2 97 %.  Filed Weights   01/09/19 0500  Weight: 96.8 kg    Labs & Radiologic Studies    CBC Recent Labs    01/08/19 1500 01/08/19 1834  WBC 11.1* 12.1*  HGB  16.1 15.6  HCT 49.2 48.9  MCV 87.4 87.8  PLT 201 454   Basic Metabolic Panel Recent Labs    01/08/19 1500 01/08/19 1834  NA 136 134*  K 3.8 4.2  CL 103 104  CO2 19* 17*  GLUCOSE 276* 424*  BUN 30* 28*  CREATININE 2.12* 1.62*  CALCIUM 9.1 8.9   Liver Function Tests Recent Labs    01/08/19 1500 01/08/19 1834  AST 22 18  ALT 24 24  ALKPHOS 65 60  BILITOT 0.5 0.4  PROT 6.1* 5.7*  ALBUMIN 3.7 3.4*   No results for input(s): LIPASE, AMYLASE in the last 72 hours. Cardiac Enzymes No results for input(s): CKTOTAL, CKMB, CKMBINDEX, TROPONINI in the last 72 hours. BNP Invalid input(s): POCBNP D-Dimer No results for input(s): DDIMER in the last 72 hours. Hemoglobin A1C Recent Labs    01/08/19 1834  HGBA1C 12.5*   Fasting Lipid Panel Recent Labs    01/08/19 1834  CHOL 208*  HDL 30*  LDLCALC UNABLE TO CALCULATE IF TRIGLYCERIDE OVER 400 mg/dL  TRIG 535*  CHOLHDL 6.9  LDLDIRECT 118.6*   Thyroid Function Tests No results for input(s): TSH, T4TOTAL, T3FREE, THYROIDAB in the last 72 hours.  Invalid input(s): FREET3 _____________  Dg Chest 2 View  Result Date: 01/08/2019 CLINICAL DATA:  Wheezing EXAM: CHEST - 2 VIEW COMPARISON:  12/23/2012 FINDINGS: Mild peribronchial thickening and interstitial prominence. Heart is normal size. No effusions. No acute bony abnormality. IMPRESSION: Mild peribronchial thickening and interstitial prominence, favor bronchitic changes. Electronically Signed   By: Ryan Farmer M.D.   On: 01/08/2019 22:51   Ct Head Wo Contrast  Result Date: 01/08/2019 CLINICAL DATA:  63 y/o  M; syncope. EXAM: CT HEAD WITHOUT CONTRAST TECHNIQUE: Contiguous axial images were obtained from the base of the skull through the vertex without intravenous contrast. COMPARISON:  None. FINDINGS: Brain: No evidence of acute infarction, hemorrhage, hydrocephalus, extra-axial collection or mass lesion/mass effect. Vascular: Calcific atherosclerosis of the internal carotid  arteries. Skull: Normal. Negative for fracture or focal lesion. Sinuses/Orbits: No acute finding. Other: None. IMPRESSION: Negative CT of the head. Electronically Signed   By: Ryan Farmer M.D.   On: 01/08/2019 20:46   Mr Lumbar Spine Wo Contrast  Result Date: 01/04/2019 CLINICAL DATA:  Low back with bilateral buttock pain for 2.5 years. No known injury or prior relevant surgery. EXAM: MRI LUMBAR SPINE WITHOUT CONTRAST TECHNIQUE: Multiplanar, multisequence MR imaging of the lumbar spine was performed. No intravenous contrast was administered. COMPARISON:  Lumbar spine radiographs 12/05/2017. FINDINGS: Segmentation: Conventional anatomy assumed, with the last open disc space designated L5-S1. Alignment:  Normal. Vertebrae: No worrisome osseous lesion or acute fracture. Probable unilateral pars defect  on the right without marrow edema. Facet mediated edema on the right at L3-4. there are partially bridging osteophytes along the anterior aspect of the right sacroiliac joint. Conus medullaris: Extends to the upper L1 level and appears normal. Paraspinal and other soft tissues: No significant paraspinal findings. Disc levels: T12-L1: Mild disc degeneration with anterior osteophytes. No spinal stenosis or nerve root encroachment. L1-2: Mild disc bulging and osteophyte formation. No significant spinal stenosis or nerve root encroachment. L2-3: Annular disc bulging and mild facet hypertrophy. Mild narrowing of the lateral recesses and foramina bilaterally without definite nerve root encroachment. L3-4: Moderate annular disc bulging. There is advanced facet hypertrophy with bilateral facet joint effusions, subchondral cyst formation on the right and ligamentous thickening. These factors contribute to moderate spinal stenosis with narrowing of the lateral recesses and foramina, right greater than left. L4-5: Loss of disc height with a broad-based central disc protrusion and small left-sided disc extrusion in  the subarticular zone. Mild facet and ligamentous hypertrophy. Mild narrowing of the lateral recesses, worse on the left. The foramina remain sufficiently patent. L5-S1: Broad-based left paracentral disc protrusion is largely contained within the ventral epidural fat, although touches the S1 nerve roots. There is no nerve root displacement or compression. Mild bilateral facet hypertrophy. The foramina are patent. IMPRESSION: 1. Moderate multifactorial spinal stenosis at L3-4 with moderate narrowing of the lateral recesses and foramina, right greater than left. Advanced facet hypertrophy at this level may contribute to back pain. 2. Asymmetric narrowing of the left lateral recess at L4-5 secondary to a small disc extrusion in the subarticular zone. This could contribute to left L5 nerve root encroachment. 3. Lesser spondylosis at the other levels as detailed above. No other significant spinal stenosis or nerve root encroachment. Electronically Signed   By: Ryan Farmer M.D.   On: 01/04/2019 10:41   Disposition   Pt is being discharged home today in good condition.  Follow-up Plans & Appointments   Call [facility] at [phone] if any bleeding, swelling or drainage at cath site.  May shower, no tub baths for 48 hours for groin sticks. No lifting over 5 pounds for 3 days.  No Driving for 3 days  Stop tobacco and any drugs not ordered by MD  Hold your metformin until 01/11/19 - it may interact with cath dye.   We increased your lantus to 35 units.  Please see PCP as soon as possible.    Follow-up Information    Ryan Sine, MD Follow up.   Specialty: Cardiology Contact information: [address] [place] [address] [phone]            Discharge Medications   Allergies as of 01/09/2019   No Known Allergies     Medication List    TAKE these medications   Accu-Chek Aviva Plus w/Device Kit 1 Device by Does not apply route 3 (three)  times daily after meals.   Accu-Chek Softclix Lancets lancets 1 each by Other route 3 (three) times daily.   aspirin 81 MG chewable tablet Chew 1 tablet (81 mg total) by mouth daily. Start taking on: January 10, 2019   atorvastatin 40 MG tablet Commonly known as: LIPITOR Take 1 tablet (40 mg total) by mouth daily.   gabapentin 300 MG capsule Commonly known as: NEURONTIN Take 1 capsule (300 mg total) by mouth 2 (two) times daily.   glucose blood test strip Commonly known as: Accu-Chek Aviva Plus 1 each by Other  route 3 (three) times daily.   Insulin Glargine 100 UNIT/ML Solostar Pen Commonly known as: LANTUS Inject 35 Units into the skin daily. What changed: how much to take   Insulin Pen Needle 31G X 8 MM Misc Commonly known as: B-D ULTRAFINE III SHORT PEN 1 application by Does not apply route daily.   BD Pen Needle Micro U/F 32G X 6 MM Misc Generic drug: Insulin Pen Needle USE UTD DAILY   lisinopril 2.5 MG tablet Commonly known as: ZESTRIL Take 1 tablet (2.5 mg total) by mouth daily. Kidney protection   metFORMIN 1000 MG tablet Commonly known as: GLUCOPHAGE Take 1 tablet (1,000 mg total) by mouth 2 (two) times daily with a meal. Start taking on: January 11, 2019 What changed: These instructions start on January 11, 2019. If you are unsure what to do until then, ask your doctor or other care provider.   tiZANidine 4 MG tablet Commonly known as: ZANAFLEX Take 4 mg by mouth every 8 (eight) hours as needed for muscle spasms.        Acute coronary syndrome (MI, NSTEMI, STEMI, etc) this admission?: No.  Possible spasm on statin    Outstanding Labs/Studies   BMP   Duration of Discharge Encounter   Greater than 30 minutes including physician time.  Signed, Cecilie Kicks, NP 01/09/2019, 12:20 PM  ---------------------------------------------------------------------------------------------   History and all data above reviewed.  Patient examined.  I agree with the  findings as above.  Ryan Farmer has a normal echocardiogram and no significant disease on cath.   Constitutional: No acute distress ENMT: normal dentition, moist mucous membranes Cardiovascular: regular rhythm, normal rate, no murmurs. S1 and S2 normal. Radial pulses normal bilaterally. No jugular venous distention.  Respiratory: clear to auscultation bilaterally GI : normal bowel sounds, soft and nontender. No distention.   MSK: extremities warm, well perfused. No edema.  NEURO: grossly nonfocal exam, moves all extremities. PSYCH: alert and oriented x 3, normal mood and affect.   All available labs, radiology testing, previous records reviewed. Agree with documented assessment and plan of my colleague as stated above with the following additions or changes:  Active Problems:   HTN (hypertension)   Tobacco use disorder   Diabetes type 2, uncontrolled (HCC)   Cocaine abuse (Ryan Farmer)   Syncope and collapse   CAD (coronary artery disease), native coronary artery, minimal by cardiac cath 01/08/19    Plan: plan to discharge home today. No change to medical therapy. F/u July 8.  Time Spent Directly with Patient:  I have spent a total of 35 minutes with the patient reviewing hospital notes, telemetry, EKGs, labs and examining the patient as well as establishing an assessment and plan that was discussed personally with the patient.  > 50% of time was spent in direct patient care.  Length of Stay:  LOS: 1 day   Elouise Munroe, MD [facility] 3:15 PM  01/09/2019

## 2019-01-10 NOTE — Telephone Encounter (Signed)
[id number] 

## 2019-01-10 NOTE — Telephone Encounter (Signed)
01/08/2019 - 01/09/2019 [place] 7/8 10:45AM

## 2019-01-14 NOTE — Telephone Encounter (Signed)
[id number] 

## 2019-01-14 NOTE — Progress Notes (Signed)
Cardiology Office Note   Date:  01/16/2019   ID:  Ryan, Farmer Ryan Farmer, MRN [id number]  PCP:  Charlott Rakes, MD  Cardiologist:  Dr. Claiborne Billings  No chief complaint on file.    History of Present Illness: Ryan Farmer is a 63 y.o. male who presents for posthospitalization follow-up after admission which occurred after syncopal episode while he was sitting at work.  EMS was called by coworker.  EKG revealed sinus rhythm with 1 mm ST elevation in the inferior leads.  A code STEMI was called, and he was brought emergently to [facility].    He was taken emergently to the cardiac catheterization lab which revealed minimal CAD and normal EF, and therefore was likely to have had coronary spasm.  Patient does have a history of cocaine abuse.  UDS was not completed during hospitalization.  Hemoglobin A1c was also found to be very elevated at 12.6.  He is on insulin at home but was advised to follow-up with PCP ASAP after discharge.  He comes today stating that he has stopped using cocaine, continues to use marijuana which helps with pain control as he has a lot of sciatica pain.  He has been followed by primary care and has had an MRI completed and is due to follow-up next week to discuss results and plans for treatment.  He continues to have issues with hyperglycemia.  He states he is taking metformin and insulin as directed.  He states that his blood sugars been running around 220-240 sometimes as high as 300 even though he takes his medication as directed.  He does not feel that the medication regimen he is on is appropriately controlling his blood sugar.  He denies any dietary noncompliance.  Past Medical History:  Diagnosis Date  . Arthritis    All over  . CAD (coronary artery disease), native coronary artery, minimal by cardiac cath 01/08/19 01/09/2019  . Chronic pain syndrome   . Diabetes mellitus   . History of cocaine use   . History of kidney stones   . Hypercholesteremia   . Hypertension    . Low back pain   . Lumbar radiculopathy   . Right leg pain   . Seborrheic dermatitis of scalp   . Shingles     Past Surgical History:  Procedure Laterality Date  . CIRCUMCISION N/A 03/16/2018   Procedure: CIRCUMCISION ADULT WITH PENILE BLOCK;  Surgeon: Alexis Frock, MD;  Location: [facility];  Service: Urology;  Laterality: N/A;  30 MINS  . KNEE ARTHROSCOPY Bilateral   . LEFT HEART CATH AND CORONARY ANGIOGRAPHY N/A 01/08/2019   Procedure: LEFT HEART CATH AND CORONARY ANGIOGRAPHY;  Surgeon: Troy Sine, MD;  Location: [facility];  Service: Cardiovascular;  Laterality: N/A;     Current Outpatient Medications  Medication Sig Dispense Refill  . ACCU-CHEK SOFTCLIX LANCETS lancets 1 each by Other route 3 (three) times daily. 100 each 12  . aspirin 81 MG chewable tablet Chew 1 tablet (81 mg total) by mouth daily.    Marland Kitchen atorvastatin (LIPITOR) 40 MG tablet Take 1 tablet (40 mg total) by mouth daily. 90 tablet 3  . BD PEN NEEDLE MICRO U/F 32G X 6 MM MISC USE UTD DAILY  3  . Blood Glucose Monitoring Suppl (ACCU-CHEK AVIVA PLUS) w/Device KIT 1 Device by Does not apply route 3 (three) times daily after meals. 1 kit 0  . gabapentin (NEURONTIN) 300 MG capsule Take 1 capsule (300 mg total)  by mouth 2 (two) times daily. 60 capsule 6  . glucose blood (ACCU-CHEK AVIVA PLUS) test strip 1 each by Other route 3 (three) times daily. 100 each 12  . Insulin Glargine (LANTUS) 100 UNIT/ML Solostar Pen Inject 35 Units into the skin daily. 5 pen 5  . Insulin Pen Needle (B-D ULTRAFINE III SHORT PEN) 31G X 8 MM MISC 1 application by Does not apply route daily. 100 each 3  . lisinopril (ZESTRIL) 2.5 MG tablet Take 1 tablet (2.5 mg total) by mouth daily. Kidney protection 90 tablet 3  . metFORMIN (GLUCOPHAGE) 1000 MG tablet Take 1 tablet (1,000 mg total) by mouth 2 (two) times daily with a meal. 60 tablet 6  . tiZANidine (ZANAFLEX) 4 MG tablet Take 4 mg by mouth every 8 (eight) hours as  needed for muscle spasms.     No current facility-administered medications for this visit.     Allergies:   Patient has no known allergies.    Social History:  The patient  reports that he has been smoking cigarettes. He has a 50.00 pack-year smoking history. He has quit using smokeless tobacco.  His smokeless tobacco use included chew. He reports current alcohol use. He reports current drug use. Drugs: Marijuana and Cocaine.   Family History:  The patient's family history includes Cancer in his mother; Diabetes in his father; Heart disease in his father; Stroke in his father.    ROS: All other systems are reviewed and negative. Unless otherwise mentioned in H&P    PHYSICAL EXAM: VS:  BP 133/82   Pulse 75   Ht 6' (1.829 m)   Wt 218 lb (98.9 kg)   SpO2 95%   BMI 29.57 kg/m  , BMI Body mass index is 29.57 kg/m. GEN: Well nourished, well developed, in no acute distress HEENT: normal Neck: no JVD, carotid bruits, or masses Cardiac: RRR; no murmurs, rubs, or gallops,no edema  Respiratory:  Clear to auscultation bilaterally, normal work of breathing GI: soft, nontender, nondistended, + BS MS: no deformity or atrophy Skin: warm and dry, no rash Neuro:  Strength and sensation are intact Psych: euthymic mood, full affect   EKG: Normal sinus rhythm, heart rate of 75 bpm.  No ST-T wave abnormalities are noted.  Recent Labs: 01/08/2019: ALT 24; BUN 28; Creatinine, Ser 1.62; Hemoglobin 15.6; Platelets 177; Potassium 4.2; Sodium 134    Lipid Panel    Component Value Date/Time   CHOL 208 (H) 01/08/2019 1834   CHOL 208 (H) 08/07/2018 1134   TRIG 535 (H) 01/08/2019 1834   HDL 30 (L) 01/08/2019 1834   HDL 37 (L) 08/07/2018 1134   CHOLHDL 6.9 01/08/2019 1834   VLDL UNABLE TO CALCULATE IF TRIGLYCERIDE OVER 400 mg/dL 01/08/2019 1834   LDLCALC UNABLE TO CALCULATE IF TRIGLYCERIDE OVER 400 mg/dL 01/08/2019 1834   LDLCALC 97 08/07/2018 1134   LDLDIRECT 118.6 (H) 01/08/2019 1834       Wt Readings from Last 3 Encounters:  01/16/19 218 lb (98.9 kg)  01/09/19 213 lb 6.5 oz (96.8 kg)  09/28/18 220 lb (99.8 kg)      Other studies Reviewed: Conclusion    Prox RCA-1 lesion is 20% stenosed.  Prox RCA-2 lesion is 20% stenosed.  Dist RCA lesion is 20% stenosed.  Dist LAD lesion is 10% stenosed.  Mid LAD lesion is 20% stenosed.  The left ventricular systolic function is normal.  LV end diastolic pressure is normal.  Mild nonobstructive CAD with smooth mild 20% proximal LAD  narrowing and evidence for mild to moderate mid systolic bridging of the mid LAD with narrowing of to 40% during systole. The left circumflex coronary artery is normal. The right coronary artery has mild 20% proximal mid stenosis and a small area of muscle bridging in the distal vessel proximal to the PDA takeoff.  Normal LV function with EF 55% without focal segmental wall motion abnormalities. LVEDP 14 mm.  RECOMMENDATION: The patient denied any chest pain prior to his syncopal spell. He had transient 1 mm inferior ST elevation with resolution on subsequent ECG suggesting potential transient spasm. Patient was hypotensive requiring fluid bolus and levophed. At the completion of the procedure blood pressure was 111/67 on a reduced dose of Levophed with plans to wean as blood pressure allows.   Recommendations  Antiplatelet/Anticoag Recommend Aspirin 73m daily for moderate CAD.      ASSESSMENT AND PLAN:  1.  Non-ST elevation MI: Thought to be coronary artery spasm in the setting of cocaine use.  The patient has had a cardiac catheterization which revealed nonobstructive CAD.  He will continue on secondary prevention with statin therapy and ACE inhibitor.   2.  Uncontrolled insulin-dependent diabetes: He does not believe that his medication regimen is keeping his blood sugar well controlled.  He will be referred to endocrinology for stricter management and recommendations to work  alongside his primary care provider.  He has not been taking lisinopril as he did not feel that he needed it.  I have advised him to begin taking lisinopril 2.5 mg daily for cardio and renal protection in the setting of diabetes.  3.  Polysubstance abuse: The patient states that he has stopped using cocaine.  He continues to smoke marijuana and occasionally drinks alcohol.  He is advised to reduce his intake of alcohol and illicit drug use.  He states he uses marijuana for chronic pain.  He is asking about medical marijuana.  I have advised him to seek out pain management specialist to discuss options with him concerning this.  4.  Hyperlipidemia Continue statin therapy.  Goal of LDL less than 70 with history of CAD although nonobstructive.  While hospitalized, his total cholesterol was 208, HDL 30, triglycerides 1535, LDL could not be calculated.  He is to continue high-dose atorvastatin   Current medicines are reviewed at length with the patient today.    Labs/ tests ordered today include: Will need follow-up lipids and LFTs in 3 months. KPhill Myron LWest Pugh ANP, AACC   01/16/2019 11:03 AM    [email or web address] Office [phone] [phone]

## 2019-01-15 ENCOUNTER — Telehealth: Payer: Self-pay | Admitting: Adult Health

## 2019-01-15 NOTE — Telephone Encounter (Signed)
LVM, reminding pt of her appt with Jory Sims on 01-16-19.

## 2019-01-15 NOTE — Telephone Encounter (Signed)
Patient has been called multiple times and left messages- will take out of triage.

## 2019-01-16 ENCOUNTER — Ambulatory Visit (INDEPENDENT_AMBULATORY_CARE_PROVIDER_SITE_OTHER): Payer: Medicare Other | Admitting: Adult Health

## 2019-01-16 ENCOUNTER — Other Ambulatory Visit: Payer: Self-pay

## 2019-01-16 ENCOUNTER — Encounter: Payer: Self-pay | Admitting: Adult Health

## 2019-01-16 VITALS — BP 133/82 | HR 75 | Ht 72.0 in | Wt 218.0 lb

## 2019-01-16 DIAGNOSIS — E7800 Pure hypercholesterolemia, unspecified: Secondary | ICD-10-CM | POA: Diagnosis not present

## 2019-01-16 DIAGNOSIS — I10 Essential (primary) hypertension: Secondary | ICD-10-CM

## 2019-01-16 DIAGNOSIS — R7309 Other abnormal glucose: Secondary | ICD-10-CM | POA: Diagnosis not present

## 2019-01-16 MED ORDER — LISINOPRIL 2.5 MG PO TABS: 2.5000 mg | ORAL_TABLET | Freq: Every day | ORAL | 3 refills | Status: DC

## 2019-01-16 MED ORDER — ATORVASTATIN CALCIUM 40 MG PO TABS: 40.0000 mg | ORAL_TABLET | Freq: Every day | ORAL | 3 refills | Status: DC

## 2019-01-16 NOTE — Patient Instructions (Signed)
Medication Instructions:  Continue current medications  If you need a refill on your cardiac medications before your next appointment, please call your pharmacy.  Labwork: None Ordered   Testing/Procedures: None Ordered   Follow-Up:  You have been referred to Endocrinologist  You will need a follow up appointment in 6 months.  Please call our office 2 months in advance to schedule this appointment.  You may see Shelva Majestic, MD or one of the following Advanced Practice Providers on your designated Care Team: [place], [place] . Fabian Sharp, PA-C     At [facility], you and your health needs are our priority.  As part of our continuing mission to provide you with exceptional heart care, we have created designated Provider Care Teams.  These Care Teams include your primary Cardiologist (physician) and Advanced Practice Providers (APPs -  Physician Assistants and Nurse Practitioners) who all work together to provide you with the care you need, when you need it.  Thank you for choosing [facility] at [facility]!!

## 2019-01-20 ENCOUNTER — Other Ambulatory Visit: Payer: Self-pay | Admitting: Family Medicine

## 2019-01-20 DIAGNOSIS — IMO0001 Reserved for inherently not codable concepts without codable children: Secondary | ICD-10-CM

## 2019-01-24 ENCOUNTER — Other Ambulatory Visit: Payer: Self-pay

## 2019-01-24 ENCOUNTER — Ambulatory Visit (INDEPENDENT_AMBULATORY_CARE_PROVIDER_SITE_OTHER): Payer: Medicare Other | Admitting: Physical Medicine and Rehabilitation

## 2019-01-24 ENCOUNTER — Encounter: Payer: Self-pay | Admitting: Physical Medicine and Rehabilitation

## 2019-01-24 VITALS — BP 133/74 | HR 83 | Ht 72.0 in | Wt 213.0 lb

## 2019-01-24 DIAGNOSIS — G8929 Other chronic pain: Secondary | ICD-10-CM

## 2019-01-24 DIAGNOSIS — E1165 Type 2 diabetes mellitus with hyperglycemia: Secondary | ICD-10-CM | POA: Diagnosis not present

## 2019-01-24 DIAGNOSIS — M47816 Spondylosis without myelopathy or radiculopathy, lumbar region: Secondary | ICD-10-CM

## 2019-01-24 DIAGNOSIS — M5442 Lumbago with sciatica, left side: Secondary | ICD-10-CM | POA: Diagnosis not present

## 2019-01-24 DIAGNOSIS — M5416 Radiculopathy, lumbar region: Secondary | ICD-10-CM

## 2019-01-24 DIAGNOSIS — M48062 Spinal stenosis, lumbar region with neurogenic claudication: Secondary | ICD-10-CM

## 2019-01-24 DIAGNOSIS — M5441 Lumbago with sciatica, right side: Secondary | ICD-10-CM

## 2019-01-24 DIAGNOSIS — G894 Chronic pain syndrome: Secondary | ICD-10-CM

## 2019-01-24 NOTE — Progress Notes (Signed)
.  Numeric Pain Rating Scale and Functional Assessment Average Pain 10 Pain Right Now 8 My pain is intermittent, constant, stabbing and aching Pain is worse with: walking and standing Pain improves with: medication   In the last MONTH (on 0-10 scale) has pain interfered with the following?  1. General activity like being  able to carry out your everyday physical activities such as walking, climbing stairs, carrying groceries, or moving a chair?  Rating(9)  2. Relation with others like being able to carry out your usual social activities and roles such as  activities at home, at work and in your community. Rating(9)  3. Enjoyment of life such that you have  been bothered by emotional problems such as feeling anxious, depressed or irritable?  Rating(4)

## 2019-02-18 ENCOUNTER — Encounter: Payer: Self-pay | Admitting: Physical Medicine and Rehabilitation

## 2019-02-18 ENCOUNTER — Ambulatory Visit: Payer: Self-pay

## 2019-02-18 ENCOUNTER — Ambulatory Visit (INDEPENDENT_AMBULATORY_CARE_PROVIDER_SITE_OTHER): Payer: Medicare Other | Admitting: Physical Medicine and Rehabilitation

## 2019-02-18 VITALS — BP 131/78 | HR 70

## 2019-02-18 DIAGNOSIS — M5416 Radiculopathy, lumbar region: Secondary | ICD-10-CM

## 2019-02-18 MED ORDER — BETAMETHASONE SOD PHOS & ACET 6 (3-3) MG/ML IJ SUSP
12.0000 mg | Freq: Once | INTRAMUSCULAR | Status: AC
  Administered 2019-02-18: 12 mg

## 2019-02-18 NOTE — Progress Notes (Signed)
 .  Numeric Pain Rating Scale and Functional Assessment Average Pain 6   In the last MONTH (on 0-10 scale) has pain interfered with the following?  1. General activity like being  able to carry out your everyday physical activities such as walking, climbing stairs, carrying groceries, or moving a chair?  Rating(7)   +Driver, -BT, -Dye Allergies.  

## 2019-02-18 NOTE — Progress Notes (Signed)
Ryan Farmer - 63 y.o. male MRN [id number]  Date of birth: Aug 23, 1955  Office Visit Note: Visit Date: 02/18/2019 PCP: Charlott Rakes, MD Referred by: Charlott Rakes, MD  Subjective: Chief Complaint  Patient presents with  . Lower Back - Pain  . Right Leg - Pain   HPI:  Ryan Farmer is a 63 y.o. male who comes in today For planned L3 transforaminal epidural steroid injection for low back and bilateral back pain with right radicular leg pain.  Unfortunately patient has very bad diabetes type 2.  He is somewhat uncontrolled is at hospitalization released admission through the ED for uncontrolled blood sugars.  Last injection was in June and it did give him relief particularly on the left leg but not the right leg.  We have updated MRI of his spine is reviewed again below and he does have decent stenosis and disc herniation extrusion at L4-5 and L5-S1.  No overt nerve compression and mostly left-sided disc findings and right-sided pain.Marland Kitchen  He is failed medication management otherwise.  He has a history of substance abuse in the remote past.  He is also had issues with complications from diabetes and has had a heart cath recently.  His pain is pretty debilitating we are going to try an epidural injection today again with reduced steroid medication due to his diabetes.  Last hemoglobin A1c was 12.6 and this was in June.  His last blood glucose reading in the labs was in July and this was 200.  We have cautioned him that we just cannot do many injections with the steroid medication as it will likely increase his blood sugar.  This is temporary and we have asked him to stay hydrated.  If this just not lasting very long his next step would be to look at possible laminectomy but I am not sure he is a great surgical candidate.  Other than that chronic pain management might be an issue he can seek a plan through his primary care physician Dr. Margarita Rana.  ROS Otherwise per HPI.  Assessment & Plan: Visit  Diagnoses:  1. Lumbar radiculopathy     Plan: No additional findings.   Meds & Orders:  Meds ordered this encounter  Medications  . betamethasone acetate-betamethasone sodium phosphate (CELESTONE) injection 12 mg    Orders Placed This Encounter  Procedures  . XR C-ARM NO REPORT  . Epidural Steroid injection    Follow-up: No follow-ups on file.   Procedures: No procedures performed  Lumbosacral Transforaminal Epidural Steroid Injection - Sub-Pedicular Approach with Fluoroscopic Guidance  Patient: Ryan Farmer      Date of Birth: 02/21/1956 MRN: [id number] PCP: Charlott Rakes, MD      Visit Date: 02/18/2019   Universal Protocol:    Date/Time: 02/18/2019  Consent Given By: the patient  Position: PRONE  Additional Comments: Vital signs were monitored before and after the procedure. Patient was prepped and draped in the usual sterile fashion. The correct patient, procedure, and site was verified.   Injection Procedure Details:  Procedure Site One Meds Administered:  Meds ordered this encounter  Medications  . betamethasone acetate-betamethasone sodium phosphate (CELESTONE) injection 12 mg    Laterality: Bilateral  Location/Site:  L3-L4  Needle size: 22 G  Needle type: Spinal  Needle Placement: Transforaminal  Findings:    -Comments: Excellent flow of contrast along the nerve and into the epidural space.  Procedure Details: After squaring off the end-plates to get a true AP view, the C-arm  was positioned so that an oblique view of the foramen as noted above was visualized. The target area is just inferior to the "nose of the scotty dog" or sub pedicular. The soft tissues overlying this structure were infiltrated with 2-3 ml. of 1% Lidocaine without Epinephrine.  The spinal needle was inserted toward the target using a "trajectory" view along the fluoroscope beam.  Under AP and lateral visualization, the needle was advanced so it did not puncture dura and  was located close the 6 O'Clock position of the pedical in AP tracterory. Biplanar projections were used to confirm position. Aspiration was confirmed to be negative for CSF and/or blood. A 1-2 ml. volume of Isovue-250 was injected and flow of contrast was noted at each level. Radiographs were obtained for documentation purposes.   After attaining the desired flow of contrast documented above, a 0.5 to 1.0 ml test dose of 0.25% Marcaine was injected into each respective transforaminal space.  The patient was observed for 90 seconds post injection.  After no sensory deficits were reported, and normal lower extremity motor function was noted,   the above injectate was administered so that equal amounts of the injectate were placed at each foramen (level) into the transforaminal epidural space.   Additional Comments:  The patient tolerated the procedure well Dressing: 2 x 2 sterile gauze and Band-Aid    Post-procedure details: Patient was observed during the procedure. Post-procedure instructions were reviewed.  Patient left the clinic in stable condition.    Clinical History: MRI LUMBAR SPINE WITHOUT CONTRAST  TECHNIQUE: Multiplanar, multisequence MR imaging of the lumbar spine was performed. No intravenous contrast was administered.  COMPARISON:  Lumbar spine radiographs 12/05/2017.  FINDINGS: Segmentation: Conventional anatomy assumed, with the last open disc space designated L5-S1.  Alignment:  Normal.  Vertebrae: No worrisome osseous lesion or acute fracture. Probable unilateral pars defect on the right without marrow edema. Facet mediated edema on the right at L3-4. there are partially bridging osteophytes along the anterior aspect of the right sacroiliac joint.  Conus medullaris: Extends to the upper L1 level and appears normal.  Paraspinal and other soft tissues: No significant paraspinal findings.  Disc levels:  T12-L1: Mild disc degeneration with anterior  osteophytes. No spinal stenosis or nerve root encroachment.  L1-2: Mild disc bulging and osteophyte formation. No significant spinal stenosis or nerve root encroachment.  L2-3: Annular disc bulging and mild facet hypertrophy. Mild narrowing of the lateral recesses and foramina bilaterally without definite nerve root encroachment.  L3-4: Moderate annular disc bulging. There is advanced facet hypertrophy with bilateral facet joint effusions, subchondral cyst formation on the right and ligamentous thickening. These factors contribute to moderate spinal stenosis with narrowing of the lateral recesses and foramina, right greater than left.  L4-5: Loss of disc height with a broad-based central disc protrusion and small left-sided disc extrusion in the subarticular zone. Mild facet and ligamentous hypertrophy. Mild narrowing of the lateral recesses, worse on the left. The foramina remain sufficiently patent.  L5-S1: Broad-based left paracentral disc protrusion is largely contained within the ventral epidural fat, although touches the S1 nerve roots. There is no nerve root displacement or compression. Mild bilateral facet hypertrophy. The foramina are patent.  IMPRESSION: 1. Moderate multifactorial spinal stenosis at L3-4 with moderate narrowing of the lateral recesses and foramina, right greater than left. Advanced facet hypertrophy at this level may contribute to back pain. 2. Asymmetric narrowing of the left lateral recess at L4-5 secondary to a small disc extrusion in  the subarticular zone. This could contribute to left L5 nerve root encroachment. 3. Lesser spondylosis at the other levels as detailed above. No other significant spinal stenosis or nerve root encroachment.   Electronically Signed   By: Richardean Sale M.D.   On: 01/04/2019 10:41     Objective:  VS:  HT:    WT:   BMI:     BP:131/78  HR:70bpm  TEMP: ( )  RESP:  Physical Exam  Ortho Exam Imaging:  Xr C-arm No Report  Result Date: 02/18/2019 Please see Notes tab for imaging impression.

## 2019-02-18 NOTE — Procedures (Signed)
Lumbosacral Transforaminal Epidural Steroid Injection - Sub-Pedicular Approach with Fluoroscopic Guidance  Patient: Ryan Farmer      Date of Birth: [birth year] MRN: [id number] PCP: Charlott Rakes, MD      Visit Date: 02/18/2019   Universal Protocol:    Date/Time: 02/18/2019  Consent Given By: the patient  Position: PRONE  Additional Comments: Vital signs were monitored before and after the procedure. Patient was prepped and draped in the usual sterile fashion. The correct patient, procedure, and site was verified.   Injection Procedure Details:  Procedure Site One Meds Administered:  Meds ordered this encounter  Medications  . betamethasone acetate-betamethasone sodium phosphate (CELESTONE) injection 12 mg    Laterality: Bilateral  Location/Site:  L3-L4  Needle size: 22 G  Needle type: Spinal  Needle Placement: Transforaminal  Findings:    -Comments: Excellent flow of contrast along the nerve and into the epidural space.  Procedure Details: After squaring off the end-plates to get a true AP view, the C-arm was positioned so that an oblique view of the foramen as noted above was visualized. The target area is just inferior to the "nose of the scotty dog" or sub pedicular. The soft tissues overlying this structure were infiltrated with 2-3 ml. of 1% Lidocaine without Epinephrine.  The spinal needle was inserted toward the target using a "trajectory" view along the fluoroscope beam.  Under AP and lateral visualization, the needle was advanced so it did not puncture dura and was located close the 6 O'Clock position of the pedical in AP tracterory. Biplanar projections were used to confirm position. Aspiration was confirmed to be negative for CSF and/or blood. A 1-2 ml. volume of Isovue-250 was injected and flow of contrast was noted at each level. Radiographs were obtained for documentation purposes.   After attaining the desired flow of contrast documented above, a 0.5  to 1.0 ml test dose of 0.25% Marcaine was injected into each respective transforaminal space.  The patient was observed for 90 seconds post injection.  After no sensory deficits were reported, and normal lower extremity motor function was noted,   the above injectate was administered so that equal amounts of the injectate were placed at each foramen (level) into the transforaminal epidural space.   Additional Comments:  The patient tolerated the procedure well Dressing: 2 x 2 sterile gauze and Band-Aid    Post-procedure details: Patient was observed during the procedure. Post-procedure instructions were reviewed.  Patient left the clinic in stable condition.

## 2019-03-04 ENCOUNTER — Ambulatory Visit: Payer: Self-pay

## 2019-03-04 ENCOUNTER — Ambulatory Visit (INDEPENDENT_AMBULATORY_CARE_PROVIDER_SITE_OTHER): Payer: Medicare Other | Admitting: Physical Medicine and Rehabilitation

## 2019-03-04 ENCOUNTER — Encounter: Payer: Self-pay | Admitting: Physical Medicine and Rehabilitation

## 2019-03-04 VITALS — BP 153/84 | HR 83

## 2019-03-04 DIAGNOSIS — M47816 Spondylosis without myelopathy or radiculopathy, lumbar region: Secondary | ICD-10-CM | POA: Diagnosis not present

## 2019-03-04 MED ORDER — BUPIVACAINE HCL 0.5 % IJ SOLN
3.0000 mL | Freq: Once | INTRAMUSCULAR | Status: AC
  Administered 2019-03-04: 08:00:00 3 mL

## 2019-03-04 MED ORDER — METHYLPREDNISOLONE ACETATE 80 MG/ML IJ SUSP
40.0000 mg | Freq: Once | INTRAMUSCULAR | Status: AC
  Administered 2019-03-04: 08:00:00 40 mg

## 2019-03-04 MED ORDER — METHYLPREDNISOLONE ACETATE 80 MG/ML IJ SUSP: 80.0000 mg | Freq: Once | INTRAMUSCULAR | Status: DC

## 2019-03-04 NOTE — Progress Notes (Signed)
.  Numeric Pain Rating Scale and Functional Assessment Average Pain 10   In the last MONTH (on 0-10 scale) has pain interfered with the following?  1. General activity like being  able to carry out your everyday physical activities such as walking, climbing stairs, carrying groceries, or moving a chair?  Rating(9)   +Driver, -BT, -Dye Allergies.  

## 2019-03-05 NOTE — Procedures (Signed)
Lumbar Diagnostic Facet Joint Nerve Block with Fluoroscopic Guidance   Patient: Ryan Farmer      Date of Birth: October 15, 1955 MRN: [id number] PCP: Charlott Rakes, MD      Visit Date: 03/04/2019   Universal Protocol:    Date/Time: 08/25/205:21 AM  Consent Given By: the patient  Position: PRONE  Additional Comments: Vital signs were monitored before and after the procedure. Patient was prepped and draped in the usual sterile fashion. The correct patient, procedure, and site was verified.   Injection Procedure Details:  Procedure Site One Meds Administered:  Meds ordered this encounter  Medications  . bupivacaine (MARCAINE) 0.5 % (with pres) injection 3 mL  . DISCONTD: methylPREDNISolone acetate (DEPO-MEDROL) injection 80 mg  . methylPREDNISolone acetate (DEPO-MEDROL) injection 40 mg     Laterality: Bilateral  Location/Site:  L3-L4 L4-L5  Needle size: 22 ga.  Needle type:spinal  Needle Placement: Oblique pedical  Findings:   -Comments: There was excellent flow of contrast along the articular pillars without intravascular flow.  Procedure Details: The fluoroscope beam is vertically oriented in AP and then obliqued 15 to 20 degrees to the ipsilateral side of the desired nerve to achieve the "Scotty dog" appearance.  The skin over the target area of the junction of the superior articulating process and the transverse process (sacral ala if blocking the L5 dorsal rami) was locally anesthetized with a 1 ml volume of 1% Lidocaine without Epinephrine.  The spinal needle was inserted and advanced in a trajectory view down to the target.   After contact with periosteum and negative aspirate for blood and CSF, correct placement without intravascular or epidural spread was confirmed by injecting 0.5 ml. of Isovue-250.  A spot radiograph was obtained of this image.    Next, a 0.5 ml. volume of the injectate described above was injected. The needle was then redirected to the other  facet joint nerves mentioned above if needed.  Prior to the procedure, the patient was given a Pain Diary which was completed for baseline measurements.  After the procedure, the patient rated their pain every 30 minutes and will continue rating at this frequency for a total of 5 hours.  The patient has been asked to complete the Diary and return to [place] by mail, fax or hand delivered as soon as possible.   Additional Comments:  The patient tolerated the procedure well Dressing: 2 x 2 sterile gauze and Band-Aid    Post-procedure details: Patient was observed during the procedure. Post-procedure instructions were reviewed.  Patient left the clinic in stable condition.

## 2019-03-05 NOTE — Progress Notes (Signed)
Ryan Farmer - 63 y.o. male MRN [id number]  Date of birth: [birth year]  Office Visit Note: Visit Date: 03/04/2019 PCP: Charlott Rakes, MD Referred by: Charlott Rakes, MD  Subjective: Chief Complaint  Patient presents with  . Lower Back - Pain   HPI:  Ryan Farmer is a 63 y.o. male who comes in today For planned bilateral L3-4 and L4-5 medial branch blocks facet joint blocks.  Patient having axial low back pain worse with standing and ambulating.  Better at rest.  Worse from sit to stand.  Exam consistent with facet mediated pain with extension and facet loading.  Has had home exercises and activity modification as well as medication management.  His case is complicated by significant type 2 diabetes which is poorly controlled.  He also has a history of some substance abuse and alcohol.  Prior epidural injection has relieved leg pain and he has no leg pain at this point.  He does have severe back pain that does limit what he can do.  We will complete diagnostic medial branch blocks as part of a comprehensive management program.  If he does well with that would look at secondary block and possible radiofrequency ablation.  ROS Otherwise per HPI.  Assessment & Plan: Visit Diagnoses:  1. Spondylosis without myelopathy or radiculopathy, lumbar region     Plan: No additional findings.   Meds & Orders:  Meds ordered this encounter  Medications  . bupivacaine (MARCAINE) 0.5 % (with pres) injection 3 mL  . DISCONTD: methylPREDNISolone acetate (DEPO-MEDROL) injection 80 mg  . methylPREDNISolone acetate (DEPO-MEDROL) injection 40 mg    Orders Placed This Encounter  Procedures  . Facet Injection  . XR C-ARM NO REPORT    Follow-up: Return if symptoms worsen or fail to improve.   Procedures: No procedures performed  Lumbar Diagnostic Facet Joint Nerve Block with Fluoroscopic Guidance   Patient: Ryan Farmer      Date of Birth: Aug 30, 1955 MRN: [id number] PCP: Charlott Rakes, MD       Visit Date: 03/04/2019   Universal Protocol:    Date/Time: 08/25/205:21 AM  Consent Given By: the patient  Position: PRONE  Additional Comments: Vital signs were monitored before and after the procedure. Patient was prepped and draped in the usual sterile fashion. The correct patient, procedure, and site was verified.   Injection Procedure Details:  Procedure Site One Meds Administered:  Meds ordered this encounter  Medications  . bupivacaine (MARCAINE) 0.5 % (with pres) injection 3 mL  . DISCONTD: methylPREDNISolone acetate (DEPO-MEDROL) injection 80 mg  . methylPREDNISolone acetate (DEPO-MEDROL) injection 40 mg     Laterality: Bilateral  Location/Site:  L3-L4 L4-L5  Needle size: 22 ga.  Needle type:spinal  Needle Placement: Oblique pedical  Findings:   -Comments: There was excellent flow of contrast along the articular pillars without intravascular flow.  Procedure Details: The fluoroscope beam is vertically oriented in AP and then obliqued 15 to 20 degrees to the ipsilateral side of the desired nerve to achieve the "Scotty dog" appearance.  The skin over the target area of the junction of the superior articulating process and the transverse process (sacral ala if blocking the L5 dorsal rami) was locally anesthetized with a 1 ml volume of 1% Lidocaine without Epinephrine.  The spinal needle was inserted and advanced in a trajectory view down to the target.   After contact with periosteum and negative aspirate for blood and CSF, correct placement without intravascular or epidural spread was confirmed  by injecting 0.5 ml. of Isovue-250.  A spot radiograph was obtained of this image.    Next, a 0.5 ml. volume of the injectate described above was injected. The needle was then redirected to the other facet joint nerves mentioned above if needed.  Prior to the procedure, the patient was given a Pain Diary which was completed for baseline measurements.  After the  procedure, the patient rated their pain every 30 minutes and will continue rating at this frequency for a total of 5 hours.  The patient has been asked to complete the Diary and return to [place] by mail, fax or hand delivered as soon as possible.   Additional Comments:  The patient tolerated the procedure well Dressing: 2 x 2 sterile gauze and Band-Aid    Post-procedure details: Patient was observed during the procedure. Post-procedure instructions were reviewed.  Patient left the clinic in stable condition.   Clinical History: MRI LUMBAR SPINE WITHOUT CONTRAST  TECHNIQUE: Multiplanar, multisequence MR imaging of the lumbar spine was performed. No intravenous contrast was administered.  COMPARISON:  Lumbar spine radiographs 12/05/2017.  FINDINGS: Segmentation: Conventional anatomy assumed, with the last open disc space designated L5-S1.  Alignment:  Normal.  Vertebrae: No worrisome osseous lesion or acute fracture. Probable unilateral pars defect on the right without marrow edema. Facet mediated edema on the right at L3-4. there are partially bridging osteophytes along the anterior aspect of the right sacroiliac joint.  Conus medullaris: Extends to the upper L1 level and appears normal.  Paraspinal and other soft tissues: No significant paraspinal findings.  Disc levels:  T12-L1: Mild disc degeneration with anterior osteophytes. No spinal stenosis or nerve root encroachment.  L1-2: Mild disc bulging and osteophyte formation. No significant spinal stenosis or nerve root encroachment.  L2-3: Annular disc bulging and mild facet hypertrophy. Mild narrowing of the lateral recesses and foramina bilaterally without definite nerve root encroachment.  L3-4: Moderate annular disc bulging. There is advanced facet hypertrophy with bilateral facet joint effusions, subchondral cyst formation on the right and ligamentous thickening. These factors contribute to moderate  spinal stenosis with narrowing of the lateral recesses and foramina, right greater than left.  L4-5: Loss of disc height with a broad-based central disc protrusion and small left-sided disc extrusion in the subarticular zone. Mild facet and ligamentous hypertrophy. Mild narrowing of the lateral recesses, worse on the left. The foramina remain sufficiently patent.  L5-S1: Broad-based left paracentral disc protrusion is largely contained within the ventral epidural fat, although touches the S1 nerve roots. There is no nerve root displacement or compression. Mild bilateral facet hypertrophy. The foramina are patent.  IMPRESSION: 1. Moderate multifactorial spinal stenosis at L3-4 with moderate narrowing of the lateral recesses and foramina, right greater than left. Advanced facet hypertrophy at this level may contribute to back pain. 2. Asymmetric narrowing of the left lateral recess at L4-5 secondary to a small disc extrusion in the subarticular zone. This could contribute to left L5 nerve root encroachment. 3. Lesser spondylosis at the other levels as detailed above. No other significant spinal stenosis or nerve root encroachment.   Electronically Signed   By: Richardean Sale M.D.   On: 01/04/2019 10:41     Objective:  VS:  HT:    WT:   BMI:     BP:(!) 153/84  HR:83bpm  TEMP: ( )  RESP:  Physical Exam  Ortho Exam Imaging: Xr C-arm No Report  Result Date: 03/04/2019 Please see Notes tab for imaging impression.

## 2019-03-07 ENCOUNTER — Encounter: Payer: Self-pay | Admitting: Physical Medicine and Rehabilitation

## 2019-03-07 DIAGNOSIS — M5442 Lumbago with sciatica, left side: Secondary | ICD-10-CM | POA: Insufficient documentation

## 2019-03-07 DIAGNOSIS — M5416 Radiculopathy, lumbar region: Secondary | ICD-10-CM | POA: Insufficient documentation

## 2019-03-07 DIAGNOSIS — G8929 Other chronic pain: Secondary | ICD-10-CM | POA: Insufficient documentation

## 2019-03-07 DIAGNOSIS — M48062 Spinal stenosis, lumbar region with neurogenic claudication: Secondary | ICD-10-CM | POA: Insufficient documentation

## 2019-03-07 NOTE — Progress Notes (Signed)
ELICEO Farmer - 63 y.o. male MRN [id number]  Date of birth: 09/04/1955  Office Visit Note: Visit Date: 01/24/2019 PCP: Charlott Rakes, MD Referred by: Charlott Rakes, MD  Subjective: Chief Complaint  Patient presents with   Lower Back - Pain   Right Leg - Pain   Left Leg - Pain   HPI: Ryan Farmer is a 63 y.o. male who comes in today For evaluation and management of low back pain which is chronic and severe 10 out of 10 pain that can refer into both buttocks and both legs.  Patient had right L4 transforaminal injection on 11/21/2018 with good relief for a short while.  Prior L5 injection was very beneficial when I first saw the patient.  By way of quick review we saw the patient as a self-referral and he was having radicular leg pain in a classic L5 distribution and an L5 transforaminal injection did give him great relief.  He was sort of lost to follow-up and then we ended up injecting him again with good relief but short-lived.  At that point we decided given the issues involved and plus his medical and biopsychosocial history we decided that an MRI of the lumbar spine was a good plan and he is here today following up after MRI.  From a symptom standpoint he has not noted any focal weakness or bowel or bladder changes.  Does have pain down both legs now more than the right leg.  He describes this is intermittent stabbing and aching but on certain days it feels constant.  He gets worsening with standing and walking and activity and better at rest.  He has had no foot drop or other red flag complaints.  No unintended weight loss.  His history is very complicated by poorly controlled diabetes and heart disease.  He has had a heart cath since I have last seen him.  He also has a history of substance abuse both alcohol and cocaine and tobacco.  He is not taking any opioid medications and we would not provide any here.  MRI was reviewed with the patient today and is reviewed below.  We will show  this to him on spine models and discussed his condition.  MRI basically showed moderate multifactorial stenosis and facet arthritis with some edema at L3-4 and also a left sided disc protrusion subligamentous likely could cause irritation of the L V nerve root.  This disc was at L4-5.  Similar but smaller finding L5-S1.  Review of Systems  Constitutional: Negative for chills, fever, malaise/fatigue and weight loss.  HENT: Negative for hearing loss and sinus pain.   Eyes: Negative for blurred vision, double vision and photophobia.  Respiratory: Negative for cough and shortness of breath.   Cardiovascular: Negative for chest pain, palpitations and leg swelling.  Gastrointestinal: Negative for abdominal pain, nausea and vomiting.  Genitourinary: Negative for flank pain.  Musculoskeletal: Positive for back pain and joint pain. Negative for myalgias.       Bilateral leg pain  Skin: Negative for itching and rash.  Neurological: Positive for tingling. Negative for tremors, focal weakness and weakness.  Endo/Heme/Allergies: Negative.   Psychiatric/Behavioral: Negative for depression.  All other systems reviewed and are negative.  Otherwise per HPI.  Assessment & Plan: Visit Diagnoses:  1. Spinal stenosis of lumbar region with neurogenic claudication   2. Lumbar radiculopathy   3. Spondylosis without myelopathy or radiculopathy, lumbar region   4. Chronic bilateral low back pain with bilateral sciatica  5. Uncontrolled type 2 diabetes mellitus with hyperglycemia ([place])   6. Chronic pain syndrome     Plan: Findings:  Chronic worsening low back and bilateral radicular type leg pain in the setting of prior history of substance abuse and chronic pain syndrome.  He does have significant facet arthritis at L3-4 with moderate multifactorial stenosis but also small subligamentous disc protrusions mainly on the left side at L4 and L5.  We initially saw him with significant radicular complaints that were  helped with epidural injection for several months.  Last injection was also beneficial but short-lived.  I think the next step is bilateral L3 transforaminal injections or L4 injections around the level of stenosis and just see how well he does.  He should follow-up with his primary care physician Dr. Margarita Rana for more medication type treatment potentially with medication such as gabapentin increases or other medications etc.  We would consider starting this or self depending on the issue.  We will make further plans been on how he does with this injection.  We would need to limit him to just a few a year because of his diabetes in particular.  We discussed the role of steroid injections with increased blood sugar.  We did talk about core strengthening and exercises and he does try to stay physically active.    Meds & Orders: No orders of the defined types were placed in this encounter.  No orders of the defined types were placed in this encounter.   Follow-up: Return for Bilateral transforaminal epidural steroid injection.   Procedures: No procedures performed  No notes on file   Clinical History: MRI LUMBAR SPINE WITHOUT CONTRAST  TECHNIQUE: Multiplanar, multisequence MR imaging of the lumbar spine was performed. No intravenous contrast was administered.  COMPARISON:  Lumbar spine radiographs 12/05/2017.  FINDINGS: Segmentation: Conventional anatomy assumed, with the last open disc space designated L5-S1.  Alignment:  Normal.  Vertebrae: No worrisome osseous lesion or acute fracture. Probable unilateral pars defect on the right without marrow edema. Facet mediated edema on the right at L3-4. there are partially bridging osteophytes along the anterior aspect of the right sacroiliac joint.  Conus medullaris: Extends to the upper L1 level and appears normal.  Paraspinal and other soft tissues: No significant paraspinal findings.  Disc levels:  T12-L1: Mild disc  degeneration with anterior osteophytes. No spinal stenosis or nerve root encroachment.  L1-2: Mild disc bulging and osteophyte formation. No significant spinal stenosis or nerve root encroachment.  L2-3: Annular disc bulging and mild facet hypertrophy. Mild narrowing of the lateral recesses and foramina bilaterally without definite nerve root encroachment.  L3-4: Moderate annular disc bulging. There is advanced facet hypertrophy with bilateral facet joint effusions, subchondral cyst formation on the right and ligamentous thickening. These factors contribute to moderate spinal stenosis with narrowing of the lateral recesses and foramina, right greater than left.  L4-5: Loss of disc height with a broad-based central disc protrusion and small left-sided disc extrusion in the subarticular zone. Mild facet and ligamentous hypertrophy. Mild narrowing of the lateral recesses, worse on the left. The foramina remain sufficiently patent.  L5-S1: Broad-based left paracentral disc protrusion is largely contained within the ventral epidural fat, although touches the S1 nerve roots. There is no nerve root displacement or compression. Mild bilateral facet hypertrophy. The foramina are patent.  IMPRESSION: 1. Moderate multifactorial spinal stenosis at L3-4 with moderate narrowing of the lateral recesses and foramina, right greater than left. Advanced facet hypertrophy at this level may  contribute to back pain. 2. Asymmetric narrowing of the left lateral recess at L4-5 secondary to a small disc extrusion in the subarticular zone. This could contribute to left L5 nerve root encroachment. 3. Lesser spondylosis at the other levels as detailed above. No other significant spinal stenosis or nerve root encroachment.   Electronically Signed   By: Richardean Sale M.D.   On: 01/04/2019 10:41   He reports that he has been smoking cigarettes. He has a 50.00 pack-year smoking history. He has  quit using smokeless tobacco.  His smokeless tobacco use included chew.  Recent Labs    08/07/18 1040 01/08/19 1500 01/08/19 1834  HGBA1C 10.4* 12.6* 12.5*    Objective:  VS:  HT:6' (182.9 cm)    WT:213 lb (96.6 kg)   BMI:28.88     BP:133/74   HR:83bpm   TEMP: ( )   RESP:  Physical Exam Vitals signs and nursing note reviewed.  Constitutional:      General: He is not in acute distress.    Appearance: He is well-developed.  HENT:     Head: Normocephalic and atraumatic.     Nose: Nose normal.     Mouth/Throat:     Mouth: Mucous membranes are moist.     Pharynx: Oropharynx is clear.  Eyes:     Conjunctiva/sclera: Conjunctivae normal.     Pupils: Pupils are equal, round, and reactive to light.  Neck:     Musculoskeletal: Normal range of motion and neck supple.     Trachea: No tracheal deviation.  Cardiovascular:     Rate and Rhythm: Normal rate and regular rhythm.     Pulses: Normal pulses.  Pulmonary:     Effort: Pulmonary effort is normal.     Breath sounds: Normal breath sounds.  Abdominal:     General: There is no distension.     Palpations: Abdomen is soft.     Tenderness: There is no guarding or rebound.  Musculoskeletal:        General: No deformity.     Right lower leg: No edema.     Left lower leg: No edema.     Comments: Patient ambulates without aid he is slow to rise from seated position full extension does have pain with extension and facet loading of the low back.  He has no pain over the greater trochanters no pain with hip rotation.  He has negative slump test bilaterally he has good distal strength.  Skin:    General: Skin is warm and dry.     Findings: No erythema or rash.  Neurological:     General: No focal deficit present.     Mental Status: He is alert and oriented to person, place, and time.     Motor: No abnormal muscle tone.     Coordination: Coordination normal.     Gait: Gait normal.  Psychiatric:        Mood and Affect: Mood normal.         Behavior: Behavior normal.        Thought Content: Thought content normal.     Ortho Exam Imaging: No results found.  Past Medical/Family/Surgical/Social History: Medications & Allergies reviewed per EMR, new medications updated. Patient Active Problem List   Diagnosis Date Noted   Spinal stenosis of lumbar region with neurogenic claudication 03/07/2019   Lumbar radiculopathy 03/07/2019   Chronic bilateral low back pain with bilateral sciatica 03/07/2019   CAD (coronary artery disease), native coronary artery, minimal by  cardiac cath 01/08/19 01/09/2019   Syncope and collapse    Spondylosis without myelopathy or radiculopathy, lumbar region 04/26/2018   Cocaine abuse ([place]) 12/19/2016   Shingles outbreak 12/13/2016   Diabetes type 2, uncontrolled ([place]) 04/19/2016   Metatarsalgia 10/19/2015   Back muscle spasm 11/18/2013   Hyperlipidemia 11/18/2013   HTN (hypertension) 11/22/2012   Chronic pain syndrome 11/22/2012   Tobacco use disorder 11/22/2012   Dental caries 11/22/2012   Past Medical History:  Diagnosis Date   Arthritis    All over   CAD (coronary artery disease), native coronary artery, minimal by cardiac cath 01/08/19 01/09/2019   Chronic pain syndrome    Diabetes mellitus    History of cocaine use    History of kidney stones    Hypercholesteremia    Hypertension    Low back pain    Lumbar radiculopathy    Right leg pain    Seborrheic dermatitis of scalp    Shingles    Family History  Problem Relation Age of Onset   Cancer Mother    Stroke Father    Heart disease Father    Diabetes Father    Past Surgical History:  Procedure Laterality Date   CIRCUMCISION N/A 03/16/2018   Procedure: CIRCUMCISION ADULT WITH PENILE BLOCK;  Surgeon: Alexis Frock, MD;  [facility];  Service: Urology;  Laterality: N/A;  85 MINS   KNEE ARTHROSCOPY Bilateral    LEFT HEART CATH AND CORONARY ANGIOGRAPHY N/A 01/08/2019    Procedure: LEFT HEART CATH AND CORONARY ANGIOGRAPHY;  Surgeon: Troy Sine, MD;  Location: [facility];  Service: Cardiovascular;  Laterality: N/A;   Social History   Occupational History   Not on file  Tobacco Use   Smoking status: Current Every Day Smoker    Packs/day: 1.00    Years: 50.00    Pack years: 50.00    Types: Cigarettes   Smokeless tobacco: Former Systems developer    Types: Chew  Substance and Sexual Activity   Alcohol use: Yes    Comment: occ   Drug use: Yes    Types: Marijuana, Cocaine    Comment: twice a week   Sexual activity: Not on file

## 2019-03-19 ENCOUNTER — Telehealth: Payer: Self-pay | Admitting: Physical Medicine and Rehabilitation

## 2019-03-19 DIAGNOSIS — M48062 Spinal stenosis, lumbar region with neurogenic claudication: Secondary | ICD-10-CM

## 2019-03-19 DIAGNOSIS — G8929 Other chronic pain: Secondary | ICD-10-CM

## 2019-03-19 DIAGNOSIS — M5416 Radiculopathy, lumbar region: Secondary | ICD-10-CM

## 2019-03-19 NOTE — Telephone Encounter (Signed)
We need to get him in to See Nitka.

## 2019-03-20 NOTE — Telephone Encounter (Signed)
Referral placed to Dr. Louanne Skye. I left a message to notify patient.

## 2019-04-01 ENCOUNTER — Other Ambulatory Visit: Payer: Self-pay

## 2019-04-03 ENCOUNTER — Encounter: Payer: Medicare Other | Admitting: Internal Medicine

## 2019-04-03 NOTE — Progress Notes (Deleted)
Name: MARSEL Farmer  MRN/ DOB: [id number], 09/24/1955   Age/ Sex: 63 y.o., male    PCP: Ryan Rakes, MD   Reason for Endocrinology Evaluation: Type 2 Diabetes Mellitus     Date of Initial Endocrinology Visit: 04/03/2019     PATIENT IDENTIFIER: Ryan Farmer is a 63 y.o. male with a past medical history of T2DM, Hx of drug abuse (cocaine). The patient presented for initial endocrinology clinic visit on 04/03/2019 for consultative assistance with his diabetes management.    HPI: Ryan Farmer was    Diagnosed with DM in 2011 Prior Medications tried/Intolerance: Glipizide Currently checking blood sugars *** x / day,  before breakfast and ***.  Hypoglycemia episodes : ***               Symptoms: ***                 Frequency: ***/  Hemoglobin A1c has ranged from 7.4% in 2016, peaking at 12.6% in 12/2018. Patient required assistance for hypoglycemia:  Patient has required hospitalization within the last 1 year from hyper or hypoglycemia:   In terms of diet, the patient ***  Pt receives steroid intra-articular injections   HOME DIABETES REGIMEN: Lantus 35 units Metformin 1000 mg BID   Statin: yes ACE-I/ARB: yes Prior Diabetic Education: {Yes/No:11203}   METER DOWNLOAD SUMMARY: Date range evaluated: *** Fingerstick Blood Glucose Tests = *** Average Number Tests/Day = *** Overall Mean FS Glucose = *** Standard Deviation = ***  BG Ranges: Low = *** High = ***   Hypoglycemic Events/30 Days: BG < 50 = *** Episodes of symptomatic severe hypoglycemia = ***   DIABETIC COMPLICATIONS: Microvascular complications:   Neuropathy  Denies: ***  Last eye exam: Completed   Macrovascular complications:   ***  Denies: CAD, PVD, CVA   PAST HISTORY: Past Medical History:  Past Medical History:  Diagnosis Date  . Arthritis    All over  . CAD (coronary artery disease), native coronary artery, minimal by cardiac cath 01/08/19 01/09/2019  . Chronic pain syndrome   .  Diabetes mellitus   . History of cocaine use   . History of kidney stones   . Hypercholesteremia   . Hypertension   . Low back pain   . Lumbar radiculopathy   . Right leg pain   . Seborrheic dermatitis of scalp   . Shingles     Past Surgical History:  Past Surgical History:  Procedure Laterality Date  . CIRCUMCISION N/A 03/16/2018   Procedure: CIRCUMCISION ADULT WITH PENILE BLOCK;  Surgeon: Alexis Frock, MD;  Location[facility];  Service: Urology;  Laterality: N/A;  28 MINS  . KNEE ARTHROSCOPY Bilateral   . LEFT HEART CATH AND CORONARY ANGIOGRAPHY N/A 01/08/2019   Procedure: LEFT HEART CATH AND CORONARY ANGIOGRAPHY;  Surgeon: Troy Sine, MD;  Location: [facility];  Service: Cardiovascular;  Laterality: N/A;      Social History:  reports that he has been smoking cigarettes. He has a 50.00 pack-year smoking history. He has quit using smokeless tobacco.  His smokeless tobacco use included chew. He reports current alcohol use. He reports current drug use. Drugs: Marijuana and Cocaine. Family History:  Family History  Problem Relation Age of Onset  . Cancer Mother   . Stroke Father   . Heart disease Father   . Diabetes Father       HOME MEDICATIONS: Allergies as of 04/03/2019   No Known Allergies  Medication List       Accurate as of April 03, 2019  8:08 AM. If you have any questions, ask your nurse or doctor.        Accu-Chek Aviva Plus test strip Generic drug: glucose blood USE AS DIRECTED THREE TIMES DAILY   Accu-Chek Aviva Plus w/Device Kit 1 Device by Does not apply route 3 (three) times daily after meals.   Accu-Chek Softclix Lancets lancets 1 each by Other route 3 (three) times daily.   aspirin 81 MG chewable tablet Chew 1 tablet (81 mg total) by mouth daily.   atorvastatin 40 MG tablet Commonly known as: LIPITOR Take 1 tablet (40 mg total) by mouth daily.   gabapentin 300 MG capsule Commonly known as: NEURONTIN  Take 1 capsule (300 mg total) by mouth 2 (two) times daily.   Insulin Glargine 100 UNIT/ML Solostar Pen Commonly known as: LANTUS Inject 35 Units into the skin daily.   Insulin Pen Needle 31G X 8 MM Misc Commonly known as: B-D ULTRAFINE III SHORT PEN 1 application by Does not apply route daily.   BD Pen Needle Micro U/F 32G X 6 MM Misc Generic drug: Insulin Pen Needle USE UTD DAILY   lisinopril 2.5 MG tablet Commonly known as: ZESTRIL Take 1 tablet (2.5 mg total) by mouth daily. Kidney protection   metFORMIN 1000 MG tablet Commonly known as: GLUCOPHAGE Take 1 tablet (1,000 mg total) by mouth 2 (two) times daily with a meal.   tiZANidine 4 MG tablet Commonly known as: ZANAFLEX Take 4 mg by mouth every 8 (eight) hours as needed for muscle spasms.        ALLERGIES: No Known Allergies   REVIEW OF SYSTEMS: A comprehensive ROS was conducted with the patient and is negative except as per HPI and below:  ROS    OBJECTIVE:   VITAL SIGNS: There were no vitals taken for this visit.   PHYSICAL EXAM:  General: Pt appears well and is in NAD  Hydration: Well-hydrated with moist mucous membranes and good skin turgor  HEENT: Head: Unremarkable with good dentition. Oropharynx clear without exudate.  Eyes: External eye exam normal without stare, lid lag or exophthalmos.  EOM intact.  PERRL.  Neck: General: Supple without adenopathy or carotid bruits. Thyroid: Thyroid size normal.  No goiter or nodules appreciated. No thyroid bruit.  Lungs: Clear with good BS bilat with no rales, rhonchi, or wheezes  Heart: RRR with normal S1 and S2 and no gallops; no murmurs; no rub  Abdomen: Normoactive bowel sounds, soft, nontender, without masses or organomegaly palpable  Extremities:  Lower extremities - No pretibial edema. No lesions.  Skin: Normal texture and temperature to palpation. No rash noted. No Acanthosis nigricans/skin tags. No lipohypertrophy.  Neuro: MS is good with appropriate  affect, pt is alert and Ox3    DM foot exam:    DATA REVIEWED:  Lab Results  Component Value Date   HGBA1C 12.5 (H) 01/08/2019   HGBA1C 12.6 (H) 01/08/2019   HGBA1C 10.4 (A) 08/07/2018   Lab Results  Component Value Date   MICROALBUR 150 12/13/2016   LDLCALC UNABLE TO CALCULATE IF TRIGLYCERIDE OVER 400 mg/dL 01/08/2019   CREATININE 1.62 (H) 01/08/2019   Lab Results  Component Value Date   MICRALBCREAT >300 12/13/2016    Lab Results  Component Value Date   CHOL 208 (H) 01/08/2019   HDL 30 (Farmer) 01/08/2019   LDLCALC UNABLE TO CALCULATE IF TRIGLYCERIDE OVER 400 mg/dL 01/08/2019   LDLDIRECT  118.6 (H) 01/08/2019   TRIG 535 (H) 01/08/2019   CHOLHDL 6.9 01/08/2019        ASSESSMENT / PLAN / RECOMMENDATIONS:   1) Type 2 Diabetes Mellitus, Poorly controlled, With*** complications - Most recent A1c of *** %. Goal A1c < *** %.  ***  Plan: GENERAL:  ***  MEDICATIONS:  ***  EDUCATION / INSTRUCTIONS:  BG monitoring instructions: Patient is instructed to check his blood sugars *** times a day, ***.  Call [facility] if: BG persistently < 70 or > 300. . I reviewed the Rule of 15 for the treatment of hypoglycemia in detail with the patient. Literature supplied.   2) Diabetic complications:   Eye: Does *** have known diabetic retinopathy.   Neuro/ Feet: Does *** have known diabetic peripheral neuropathy.  Renal: Patient does *** have known baseline CKD. He is *** on an ACEI/ARB at present.Check urine albumin/creatinine ratio yearly starting at time of diagnosis. If albuminuria is positive, treatment is geared toward better glucose, blood pressure control and use of ACE inhibitors or ARBs. Monitor electrolytes and creatinine once to twice yearly.   3) Lipids: Patient is *** on a statin.    4) Hypertension: ***  at goal of < 140/90 mmHg.       Signed electronically by: Mack Guise, MD  [facility]  [facility]  [address] [place], [place] [address] Phone: [phone] FAX: [phone]   CC: Ryan Farmer, [place] [address] Phone: [phone]  Fax: [phone]    Return to Endocrinology clinic as below: Future Appointments  Date Time Provider [place]  04/03/2019  8:50 AM Shamleffer, Melanie Crazier, MD [facility] None  05/01/2019 10:30 AM Jessy Oto, MD [facility] None

## 2019-04-11 ENCOUNTER — Other Ambulatory Visit: Payer: Self-pay

## 2019-04-15 ENCOUNTER — Other Ambulatory Visit: Payer: Self-pay

## 2019-04-15 ENCOUNTER — Ambulatory Visit (INDEPENDENT_AMBULATORY_CARE_PROVIDER_SITE_OTHER): Payer: Medicare Other | Admitting: Internal Medicine

## 2019-04-15 ENCOUNTER — Encounter: Payer: Self-pay | Admitting: Internal Medicine

## 2019-04-15 VITALS — BP 144/82 | HR 83 | Temp 97.6°F | Ht 72.0 in | Wt 217.6 lb

## 2019-04-15 DIAGNOSIS — E1142 Type 2 diabetes mellitus with diabetic polyneuropathy: Secondary | ICD-10-CM

## 2019-04-15 DIAGNOSIS — E1159 Type 2 diabetes mellitus with other circulatory complications: Secondary | ICD-10-CM | POA: Diagnosis not present

## 2019-04-15 DIAGNOSIS — E1165 Type 2 diabetes mellitus with hyperglycemia: Secondary | ICD-10-CM | POA: Diagnosis not present

## 2019-04-15 DIAGNOSIS — E781 Pure hyperglyceridemia: Secondary | ICD-10-CM

## 2019-04-15 DIAGNOSIS — Z794 Long term (current) use of insulin: Secondary | ICD-10-CM | POA: Diagnosis not present

## 2019-04-15 LAB — BASIC METABOLIC PANEL
BUN: 24 mg/dL — ABNORMAL HIGH (ref 6–23)
CO2: 20 mEq/L (ref 19–32)
Calcium: 9.6 mg/dL (ref 8.4–10.5)
Chloride: 103 mEq/L (ref 96–112)
Creatinine, Ser: 0.93 mg/dL (ref 0.40–1.50)
GFR: 82.28 mL/min (ref >60.00)
Glucose, Bld: 313 mg/dL — ABNORMAL HIGH (ref 70–99)
Potassium: 4.7 mEq/L (ref 3.5–5.1)
Sodium: 134 mEq/L — ABNORMAL LOW (ref 135–145)

## 2019-04-15 LAB — POCT GLYCOSYLATED HEMOGLOBIN (HGB A1C): Hemoglobin A1C: 10.4 % — AB (ref 4.0–5.6)

## 2019-04-15 LAB — GLUCOSE, POCT (MANUAL RESULT ENTRY): POC Glucose: 317 mg/dl — AB (ref 70–99)

## 2019-04-15 MED ORDER — NOVOLOG MIX 70/30 FLEXPEN (70-30) 100 UNIT/ML ~~LOC~~ SUPN: 24.0000 [IU] | PEN_INJECTOR | Freq: Two times a day (BID) | SUBCUTANEOUS | 11 refills | Status: DC

## 2019-04-15 MED ORDER — ACCU-CHEK GUIDE VI STRP: ORAL_STRIP | 12 refills | Status: DC

## 2019-04-15 NOTE — Patient Instructions (Signed)
-   Stop Lantus  - Start Novolog Mix 24 units with Breakfast and 24 units with Supper - Continue Metformin 1 tablet twice a day     - Check sugars before Breakfast and Supper     - HOW TO TREAT LOW BLOOD SUGARS (Blood sugar LESS THAN 70 MG/DL)  Please follow the RULE OF 15 for the treatment of hypoglycemia treatment (when your (blood sugars are less than 70 mg/dL)    STEP 1: Take 15 grams of carbohydrates when your blood sugar is low, which includes:   3-4 GLUCOSE TABS  OR  3-4 OZ OF JUICE OR REGULAR SODA OR  ONE TUBE OF GLUCOSE GEL     STEP 2: RECHECK blood sugar in 15 MINUTES STEP 3: If your blood sugar is still low at the 15 minute recheck --> then, go back to STEP 1 and treat AGAIN with another 15 grams of carbohydrates.

## 2019-04-15 NOTE — Progress Notes (Signed)
Name: Ryan Farmer  MRN/ DOB: [id number], 1955/08/11   Age/ Sex: 63 y.o., male    PCP: Charlott Rakes, MD   Reason for Endocrinology Evaluation: Type 2 Diabetes Mellitus     Date of Initial Endocrinology Visit: 04/15/2019     PATIENT IDENTIFIER: [name] is a 63 y.o. male with a past medical history of CAD, T2DM, Hyperlipidemia and polysubstance abuse. The patient presented for initial endocrinology clinic visit on 04/15/2019 for consultative assistance with his diabetes management.    HPI: Mr. Bond was    Diagnosed with DM in 2011 Prior Medications tried/Intolerance: Glipizide, insulin started in 2018 Currently checking blood sugars 0 x / day.  Hypoglycemia episodes : no  Hemoglobin A1c has ranged from 6.4% in 2017, peaking at 12.6% in 2020. Patient required assistance for hypoglycemia: no Patient has required hospitalization within the last 1 year from hyper or hypoglycemia: no  In terms of diet, the patient eats 2 meals a day, avoids sugar sweetened beverages. Denies snacking.    Receives intra-articular injections periodically.    Lives with sister, on disability ( can not read or write)    Drinks 12-18 packs a few times a month   HOME DIABETES REGIMEN: Lantus 30 units daily - misses it 2-3x a week Metformin 1000 mg BID    Statin: yes ACE-I/ARB: yes Prior Diabetic Education: No - Declines a referral    METER DOWNLOAD SUMMARY: Did not bring meter    DIABETIC COMPLICATIONS: Microvascular complications:   Neuropathy   Denies: CKD , retinopathy   Last eye exam: Completed   Macrovascular complications:   CAD  Denies: PVD, CVA   PAST HISTORY: Past Medical History:  Past Medical History:  Diagnosis Date  . Arthritis    All over  . CAD (coronary artery disease), native coronary artery, minimal by cardiac cath 01/08/19 01/09/2019  . Chronic pain syndrome   . Diabetes mellitus   . History of cocaine use   . History of kidney stones   .  Hypercholesteremia   . Hypertension   . Low back pain   . Lumbar radiculopathy   . Right leg pain   . Seborrheic dermatitis of scalp   . Shingles    Past Surgical History:  Past Surgical History:  Procedure Laterality Date  . CIRCUMCISION N/A 03/16/2018   Procedure: CIRCUMCISION ADULT WITH PENILE BLOCK;  Surgeon: Alexis Frock, MD;  Location: [facility];  Service: Urology;  Laterality: N/A;  4 MINS  . KNEE ARTHROSCOPY Bilateral   . LEFT HEART CATH AND CORONARY ANGIOGRAPHY N/A 01/08/2019   Procedure: LEFT HEART CATH AND CORONARY ANGIOGRAPHY;  Surgeon: Troy Sine, MD;  Location: [facility];  Service: Cardiovascular;  Laterality: N/A;      Social History:  reports that he has been smoking cigarettes. He has a 50.00 pack-year smoking history. He has quit using smokeless tobacco.  His smokeless tobacco use included chew. He reports current alcohol use. He reports current drug use. Drugs: Marijuana and Cocaine. Family History:  Family History  Problem Relation Age of Onset  . Cancer Mother   . Stroke Father   . Heart disease Father   . Diabetes Father      HOME MEDICATIONS: Allergies as of 04/15/2019   No Known Allergies     Medication List       Accurate as of April 15, 2019  3:49 PM. If you have any questions, ask your nurse or doctor.  STOP taking these medications   Accu-Chek Softclix Lancets lancets Stopped by: Dorita Sciara, MD   Insulin Glargine 100 UNIT/ML Solostar Pen Commonly known as: LANTUS Stopped by: Dorita Sciara, MD     TAKE these medications   Accu-Chek Aviva Plus w/Device Kit 1 Device by Does not apply route 3 (three) times daily after meals.   Accu-Chek Guide test strip Generic drug: glucose blood Twice daily What changed:   when to take this  additional instructions Changed by: Dorita Sciara, MD   aspirin 81 MG chewable tablet Chew 1 tablet (81 mg total) by mouth daily.    atorvastatin 40 MG tablet Commonly known as: LIPITOR Take 1 tablet (40 mg total) by mouth daily.   BD Pen Needle Micro U/F 32G X 6 MM Misc Generic drug: Insulin Pen Needle USE UTD DAILY What changed: Another medication with the same name was removed. Continue taking this medication, and follow the directions you see here. Changed by: Dorita Sciara, MD   gabapentin 300 MG capsule Commonly known as: NEURONTIN Take 1 capsule (300 mg total) by mouth 2 (two) times daily.   lisinopril 2.5 MG tablet Commonly known as: ZESTRIL Take 1 tablet (2.5 mg total) by mouth daily. Kidney protection   metFORMIN 1000 MG tablet Commonly known as: GLUCOPHAGE Take 1 tablet (1,000 mg total) by mouth 2 (two) times daily with a meal.   NovoLOG Mix 70/30 FlexPen (70-30) 100 UNIT/ML FlexPen Generic drug: insulin aspart protamine - aspart Inject 0.24 mLs (24 Units total) into the skin 2 (two) times daily. Started by: Dorita Sciara, MD   tiZANidine 4 MG tablet Commonly known as: ZANAFLEX Take 4 mg by mouth every 8 (eight) hours as needed for muscle spasms.        ALLERGIES: No Known Allergies   REVIEW OF SYSTEMS: A comprehensive ROS was conducted with the patient and is negative except as per HPI and below:  Review of Systems  Constitutional: Negative for fever and weight loss.  HENT: Negative for congestion and sore throat.   Eyes: Negative for blurred vision and pain.  Respiratory: Negative for cough and shortness of breath.   Cardiovascular: Negative for chest pain and palpitations.  Gastrointestinal: Negative for diarrhea and nausea.  Genitourinary: Positive for frequency.  Musculoskeletal: Positive for back pain and joint pain.  Neurological: Positive for tingling. Negative for tremors.  Endo/Heme/Allergies: Negative for polydipsia.  Psychiatric/Behavioral: Negative for depression. The patient is not nervous/anxious.       OBJECTIVE:   VITAL SIGNS: BP (!) 144/82 (BP  Location: Left Arm, Patient Position: Sitting, Cuff Size: Normal)   Pulse 83   Temp 97.6 F (36.4 C)   Ht 6' (1.829 m)   Wt 217 lb 9.6 oz (98.7 kg)   SpO2 98%   BMI 29.51 kg/m    PHYSICAL EXAM:  General: Pt appears well and is in NAD  Hydration: Well-hydrated with moist mucous membranes and good skin turgor  HEENT: Head: Unremarkable with good dentition. Oropharynx clear without exudate.  Eyes: External eye exam normal without stare, lid lag or exophthalmos.  EOM intact.  PERRL.  Neck: General: Supple without adenopathy or carotid bruits. Thyroid: Thyroid size normal.  No goiter or nodules appreciated. No thyroid bruit.  Lungs: Clear with good BS bilat with no rales, rhonchi, or wheezes  Heart: RRR with normal S1 and S2 and no gallops; no murmurs; no rub  Abdomen: Normoactive bowel sounds, soft, nontender, without masses or organomegaly palpable  Extremities:  Lower extremities - No pretibial edema. No lesions.  Skin: Normal texture and temperature to palpation. No rash noted.  Neuro: MS is good with appropriate affect, pt is alert and Ox3    DM foot exam: 04/15/2019  The skin of the feet is intact without sores or ulcerations. The pedal pulses are 2+ on right and 2+ on left. The sensation is intact to a screening 5.07, 10 gram monofilament bilaterally   DATA REVIEWED:  Lab Results  Component Value Date   HGBA1C 10.4 (A) 04/15/2019   HGBA1C 12.5 (H) 01/08/2019   HGBA1C 12.6 (H) 01/08/2019   Lab Results  Component Value Date   LDLCALC UNABLE TO CALCULATE IF TRIGLYCERIDE OVER 400 mg/dL 01/08/2019   CREATININE 0.93 04/15/2019   Lab Results  Component Value Date   MICRALBCREAT >300 12/13/2016    Lab Results  Component Value Date   CHOL 208 (H) 01/08/2019   HDL 30 (L) 01/08/2019   LDLCALC UNABLE TO CALCULATE IF TRIGLYCERIDE OVER 400 mg/dL 01/08/2019   LDLDIRECT 118.6 (H) 01/08/2019   TRIG 535 (H) 01/08/2019   CHOLHDL 6.9 01/08/2019       In-Office BG 317 mg/dL   ASSESSMENT / PLAN / RECOMMENDATIONS:   1) Type 2  Diabetes Mellitus, Poorly controlled, With Neuropathy and CAD  complications - Most recent A1c of 10.4 %. Goal A1c < 7.5 %.    Plan: GENERAL: I have discussed with the patient the pathophysiology of diabetes. We went over the natural progression of the disease. We talked about both insulin resistance and insulin deficiency. We stressed the importance of lifestyle changes including diet and exercise. I explained the complications associated with diabetes including retinopathy, nephropathy, neuropathy as well as increased risk of cardiovascular disease. We went over the benefit seen with glycemic control  I explained to the patient that diabetic patients are at higher than normal risk for amputations.  He declined a CDE referral  Pt states his inability to read fluently or write limits his ability for diabetes care   He was given a new glucose meter and educated on use.   MEDICATIONS: - Stop Lantus  - Start Novolog Mix 24 units with Breakfast and 24 units with Supper - Continue Metformin 1 tablet twice a day    EDUCATION / INSTRUCTIONS:  BG monitoring instructions: Patient is instructed to check his blood sugars 2 times a day, before breakfast and supper.  Call [facility] if: BG persistently < 70 or > 300. . I reviewed the Rule of 15 for the treatment of hypoglycemia in detail with the patient. Literature supplied.   2) Diabetic complications:   Eye: Does not have known diabetic retinopathy.   Neuro/ Feet: Does  have known diabetic peripheral neuropathy.  Renal: Patient does not have known baseline CKD. He is on an ACEI/ARB at present.   3) Hypertriglyceridemia: Patient is on a statin. His TG are very elevated. His alcohol intake is not excessive. Part of this could be hereditary and also due to uncontrolled diabetes. Would recommend repeating lipid profile after improvement of his glucose, if still elevated,  would consider adding a  fenofibrate.    4) Hypertension: He is not  at goal of < 140/90 mmHg. Minimally elevated.    F/u in 8 weeks       Signed electronically by: Mack Guise, MD  [facility]  [address] [place], [place] [address] Phone: [phone] FAX: [phone]  CC: Charlott Rakes, MD [place] [address] Phone: [phone]  Fax: [phone]    Return to Endocrinology clinic as below: Future Appointments  Date Time Provider [place]  05/01/2019 10:30 AM Jessy Oto, MD [facility] None  06/11/2019  8:10 AM Shamleffer, Melanie Crazier, MD [facility] None

## 2019-04-17 ENCOUNTER — Other Ambulatory Visit: Payer: Self-pay

## 2019-04-17 MED ORDER — ACCU-CHEK SOFT TOUCH LANCETS MISC: 12 refills | Status: DC

## 2019-04-26 ENCOUNTER — Other Ambulatory Visit: Payer: Self-pay | Admitting: Family Medicine

## 2019-05-01 ENCOUNTER — Ambulatory Visit (INDEPENDENT_AMBULATORY_CARE_PROVIDER_SITE_OTHER): Payer: Medicare Other | Admitting: Specialist

## 2019-05-01 ENCOUNTER — Other Ambulatory Visit: Payer: Self-pay

## 2019-05-01 ENCOUNTER — Encounter: Payer: Self-pay | Admitting: Specialist

## 2019-05-01 ENCOUNTER — Ambulatory Visit: Payer: Self-pay

## 2019-05-01 VITALS — BP 123/67 | HR 79 | Ht 72.0 in | Wt 213.0 lb

## 2019-05-01 DIAGNOSIS — M4807 Spinal stenosis, lumbosacral region: Secondary | ICD-10-CM | POA: Diagnosis not present

## 2019-05-01 DIAGNOSIS — M4316 Spondylolisthesis, lumbar region: Secondary | ICD-10-CM | POA: Diagnosis not present

## 2019-05-01 DIAGNOSIS — M5416 Radiculopathy, lumbar region: Secondary | ICD-10-CM | POA: Diagnosis not present

## 2019-05-01 MED ORDER — TRAMADOL-ACETAMINOPHEN 37.5-325 MG PO TABS: 1.0000 | ORAL_TABLET | Freq: Four times a day (QID) | ORAL | 0 refills | Status: DC | PRN

## 2019-05-01 MED ORDER — GABAPENTIN 300 MG PO CAPS: 300.0000 mg | ORAL_CAPSULE | Freq: Three times a day (TID) | ORAL | 2 refills | Status: DC

## 2019-05-01 MED ORDER — DICLOFENAC SODIUM 50 MG PO TBEC: DELAYED_RELEASE_TABLET | ORAL | 0 refills | Status: AC

## 2019-05-01 MED ORDER — BACLOFEN 10 MG PO TABS: 10.0000 mg | ORAL_TABLET | Freq: Two times a day (BID) | ORAL | 1 refills | Status: DC

## 2019-05-01 NOTE — Progress Notes (Signed)
Office Visit Note   Patient: Ryan Farmer           Date of Birth: Oct 08, 1955           MRN: [id number] Visit Date: 05/01/2019              Requested by: Magnus Sinning, MD [address] [place],  [place] [address] PCP: Charlott Rakes, MD   Assessment & Plan: Visit Diagnoses:  1. Lumbar radiculopathy   2. Spinal stenosis of lumbosacral region   3. Spondylolisthesis, lumbar region     Plan: Avoid bending, stooping and avoid lifting weights greater than 10 lbs. Avoid prolong standing and walking. Avoid frequent bending and stooping  No lifting greater than 10 lbs. May use ice or moist heat for pain. Weight loss is of benefit. Handicap license is approved. Myelogram and post myelogram CT Scan lumbar  To document L3-4 spondylolisthesis and stenosis at L3-4. Ultracet for pain. Increase gabapentin for nerve pain. Baclofen for spasm.   Follow-Up Instructions: Return in about 3 weeks (around 05/22/2019).   Orders:  Orders Placed This Encounter  Procedures  . XR Lumbar Spine 2-3 Views  . DG Myelogram Lumbar  . CT LUMBAR SPINE W CONTRAST   Meds ordered this encounter  Medications  . gabapentin (NEURONTIN) 300 MG capsule    Sig: Take 1 capsule (300 mg total) by mouth 3 (three) times daily.    Dispense:  90 capsule    Refill:  2  . traMADol-acetaminophen (ULTRACET) 37.5-325 MG tablet    Sig: Take 1 tablet by mouth every 6 (six) hours as needed for moderate pain.    Dispense:  30 tablet    Refill:  0  . diclofenac (VOLTAREN) 50 MG EC tablet    Sig: Take 1 tablet (50 mg total) by mouth daily after breakfast for 4 days, THEN 1 tablet (50 mg total) 2 (two) times daily for 21 days.    Dispense:  46 tablet    Refill:  0  . baclofen (LIORESAL) 10 MG tablet    Sig: Take 1 tablet (10 mg total) by mouth 2 (two) times daily.    Dispense:  60 each    Refill:  1      Procedures: No procedures performed   Clinical Data: Findings:  CLINICAL DATA:  Low back with  bilateral buttock pain for 2.5 years. No known injury or prior relevant surgery.  EXAM: MRI LUMBAR SPINE WITHOUT CONTRAST  TECHNIQUE: Multiplanar, multisequence MR imaging of the lumbar spine was performed. No intravenous contrast was administered.  COMPARISON:  Lumbar spine radiographs 12/05/2017.  FINDINGS: Segmentation: Conventional anatomy assumed, with the last open disc space designated L5-S1.  Alignment:  Normal.  Vertebrae: No worrisome osseous lesion or acute fracture. Probable unilateral pars defect on the right without marrow edema. Facet mediated edema on the right at L3-4. there are partially bridging osteophytes along the anterior aspect of the right sacroiliac joint.  Conus medullaris: Extends to the upper L1 level and appears normal.  Paraspinal and other soft tissues: No significant paraspinal findings.  Disc levels:  T12-L1: Mild disc degeneration with anterior osteophytes. No spinal stenosis or nerve root encroachment.  L1-2: Mild disc bulging and osteophyte formation. No significant spinal stenosis or nerve root encroachment.  L2-3: Annular disc bulging and mild facet hypertrophy. Mild narrowing of the lateral recesses and foramina bilaterally without definite nerve root encroachment.  L3-4: Moderate annular disc bulging. There is advanced facet hypertrophy with bilateral facet joint  effusions, subchondral cyst formation on the right and ligamentous thickening. These factors contribute to moderate spinal stenosis with narrowing of the lateral recesses and foramina, right greater than left.  L4-5: Loss of disc height with a broad-based central disc protrusion and small left-sided disc extrusion in the subarticular zone. Mild facet and ligamentous hypertrophy. Mild narrowing of the lateral recesses, worse on the left. The foramina remain sufficiently patent.  L5-S1: Broad-based left paracentral disc protrusion is largely contained  within the ventral epidural fat, although touches the S1 nerve roots. There is no nerve root displacement or compression. Mild bilateral facet hypertrophy. The foramina are patent.  IMPRESSION: 1. Moderate multifactorial spinal stenosis at L3-4 with moderate narrowing of the lateral recesses and foramina, right greater than left. Advanced facet hypertrophy at this level may contribute to back pain. 2. Asymmetric narrowing of the left lateral recess at L4-5 secondary to a small disc extrusion in the subarticular zone. This could contribute to left L5 nerve root encroachment. 3. Lesser spondylosis at the other levels as detailed above. No other significant spinal stenosis or nerve root encroachment.   Electronically Signed   By: Richardean Sale M.D.   On: 01/04/2019 10:41    Subjective: Chief Complaint  Patient presents with  . Lower Back - Pain    63 year old male with history of back pain with posteror thigh pain and pain in the anterior thigh and knees. He reports having seen Dr. Lorin Mercy in 2016 but he could not offer anything. He was then seen by Dr. Ernestina Patches and underwent injection treatments for with bilateral ESIs done in 11/21/2018 and 02/18/2019. Bilateral L3-4 transforamenal ESIs Which at first seemed to help and then was no longer a benefit with the last set. He underwent bilateral facet injections  L3-4 and L4-5 bilaterally and this did not seem to help. He reports he felt like he had injection of the nerve causing acute sciatica into the right leg on the facet injections at the end of August. He has taken oxycodone in the past with Go pain relief but is now taking tylenol but if he can buy some "Roxis" he would to decrease his pain and he requests a prescription for percocet or roxicet.  He smokes marijuana every other day or every week. He is concerned due to the tendency to be hungery and gain weight and affect the diabetes. His last documented HgA1c was 10.4. He reports that  he has not seen his primary care MD in about 6 months. No bowel or bladder difficulty. He has pain with standing and walking. He is up and down all night and  Takes two or three tylenol for pain in the middle of the night. I save the oxycodone for when I am really hurting and not always just when I am hurting. He has diabetes, history of nephrolithias. No heart, lung or kidney disease. Reports having had a heat stroke and had a cardiac catheterization done that showed 3 blood vessels are narrowed but reportedly medicaid/medicare would not allow for stents of the arteries. He does odds and ends jobs to make ends meet. In the past he did Architect, Dentist and has a history of previous falls from height. He was a Horticulturist, commercial for 22 years. He stopped due to his back and leg pain 2-4 years ago. He reports injury to the right knee when a cement mixer landed on the right knee. He had a leg brace. He did physical therapy a couple  of time on [address],    Review of Systems   Objective: Vital Signs: BP 123/67 (BP Location: Left Arm, Patient Position: Sitting)   Pulse 79   Ht 6' (1.829 m)   Wt 213 lb (96.6 kg)   BMI 28.89 kg/m   Physical Exam  Ortho Exam  Specialty Comments:  No specialty comments available.  Imaging: Xr Lumbar Spine 2-3 Views  Result Date: 05/01/2019 Grade 1 anterolisthesis L3-4, minimal retrolisthesis L2-3, Mild DDD diffuse lumbar spine.     PMFS History: Patient Active Problem List   Diagnosis Date Noted  . Type 2 diabetes mellitus with diabetic polyneuropathy, with long-term current use of insulin ([place]) 04/15/2019  . Hypertriglyceridemia 04/15/2019  . Spinal stenosis of lumbar region with neurogenic claudication 03/07/2019  . Lumbar radiculopathy 03/07/2019  . Chronic bilateral low back pain with bilateral sciatica 03/07/2019  . CAD (coronary artery disease), native coronary artery, minimal by cardiac cath 01/08/19 01/09/2019  . Syncope and  collapse   . Spondylosis without myelopathy or radiculopathy, lumbar region 04/26/2018  . Cocaine abuse ([place]) 12/19/2016  . Shingles outbreak 12/13/2016  . Diabetes type 2, uncontrolled ([place]) 04/19/2016  . Metatarsalgia 10/19/2015  . Back muscle spasm 11/18/2013  . Hyperlipidemia 11/18/2013  . HTN (hypertension) 11/22/2012  . Chronic pain syndrome 11/22/2012  . Tobacco use disorder 11/22/2012  . Dental caries 11/22/2012   Past Medical History:  Diagnosis Date  . Arthritis    All over  . CAD (coronary artery disease), native coronary artery, minimal by cardiac cath 01/08/19 01/09/2019  . Chronic pain syndrome   . Diabetes mellitus   . History of cocaine use   . History of kidney stones   . Hypercholesteremia   . Hypertension   . Low back pain   . Lumbar radiculopathy   . Right leg pain   . Seborrheic dermatitis of scalp   . Shingles     Family History  Problem Relation Age of Onset  . Cancer Mother   . Stroke Father   . Heart disease Father   . Diabetes Father     Past Surgical History:  Procedure Laterality Date  . CIRCUMCISION N/A 03/16/2018   Procedure: CIRCUMCISION ADULT WITH PENILE BLOCK;  Surgeon: Alexis Frock, MD;  Location: [facility];  Service: Urology;  Laterality: N/A;  31 MINS  . KNEE ARTHROSCOPY Bilateral   . LEFT HEART CATH AND CORONARY ANGIOGRAPHY N/A 01/08/2019   Procedure: LEFT HEART CATH AND CORONARY ANGIOGRAPHY;  Surgeon: Troy Sine, MD;  Location: [facility] [facility];  Service: Cardiovascular;  Laterality: N/A;   Social History   Occupational History  . Not on file  Tobacco Use  . Smoking status: Current Every Day Smoker    Packs/day: 1.00    Years: 50.00    Pack years: 50.00    Types: Cigarettes  . Smokeless tobacco: Former Systems developer    Types: Chew  Substance and Sexual Activity  . Alcohol use: Yes    Comment: occ  . Drug use: Yes    Types: Marijuana, Cocaine    Comment: twice a week  . Sexual activity: Not on file

## 2019-05-01 NOTE — Patient Instructions (Signed)
Avoid bending, stooping and avoid lifting weights greater than 10 lbs. Avoid prolong standing and walking. Avoid frequent bending and stooping  No lifting greater than 10 lbs. May use ice or moist heat for pain. Weight loss is of benefit. Handicap license is approved. Myelogram and post myelogram CT Scan lumbar  To document L3-4 spondylolisthesis and stenosis at L3-4. Ultracet for pain. Increase gabapentin for nerve pain. Baclofen for spasm.

## 2019-05-07 ENCOUNTER — Telehealth: Payer: Self-pay | Admitting: Nurse Practitioner

## 2019-05-07 NOTE — Telephone Encounter (Signed)
Phone call to patient to verify medication list and allergies for myelogram procedure. Pt instructed to hold Ultracet for 48hrs prior to myelogram appointment time. Pt verbalized understanding. Pre and post procedure instructions reviewed with pt.

## 2019-05-10 ENCOUNTER — Ambulatory Visit
Admission: RE | Admit: 2019-05-10 | Discharge: 2019-05-10 | Disposition: A | Discharge status: Home / Self Care | Payer: Medicare Other | Source: Ambulatory Visit | Attending: Specialist | Admitting: Specialist

## 2019-05-10 ENCOUNTER — Other Ambulatory Visit: Payer: Self-pay

## 2019-05-10 DIAGNOSIS — M5416 Radiculopathy, lumbar region: Secondary | ICD-10-CM

## 2019-05-10 DIAGNOSIS — M4807 Spinal stenosis, lumbosacral region: Secondary | ICD-10-CM

## 2019-05-10 DIAGNOSIS — M48061 Spinal stenosis, lumbar region without neurogenic claudication: Secondary | ICD-10-CM | POA: Diagnosis not present

## 2019-05-10 DIAGNOSIS — M5126 Other intervertebral disc displacement, lumbar region: Secondary | ICD-10-CM | POA: Diagnosis not present

## 2019-05-10 MED ORDER — IOPAMIDOL (ISOVUE-M 200) INJECTION 41%
20.0000 mL | Freq: Once | INTRAMUSCULAR | Status: AC
  Administered 2019-05-10: 10:00:00 20 mL via INTRATHECAL

## 2019-05-10 MED ORDER — MEPERIDINE HCL 50 MG/ML IJ SOLN
50.0000 mg | Freq: Once | INTRAMUSCULAR | Status: AC
  Administered 2019-05-10: 50 mg via INTRAMUSCULAR

## 2019-05-10 MED ORDER — ONDANSETRON HCL 4 MG/2ML IJ SOLN
4.0000 mg | Freq: Once | INTRAMUSCULAR | Status: AC
  Administered 2019-05-10: 4 mg via INTRAMUSCULAR

## 2019-05-10 NOTE — Discharge Instructions (Signed)
Myelogram Discharge Instructions  1. Go home and rest quietly for the next 24 hours.  It is important to lie flat for the next 24 hours.  Get up only to go to the restroom.  You may lie in the bed or on a couch on your back, your stomach, your left side or your right side.  You may have one pillow under your head.  You may have pillows between your knees while you are on your side or under your knees while you are on your back.  2. DO NOT drive today.  Recline the seat as far back as it will go, while still wearing your seat belt, on the way home.  3. You may get up to go to the bathroom as needed.  You may sit up for 10 minutes to eat.  You may resume your normal diet and medications unless otherwise indicated.  Drink lots of extra fluids today and tomorrow.  4. The incidence of headache, nausea, or vomiting is about 5% (one in 20 patients).  If you develop a headache, lie flat and drink plenty of fluids until the headache goes away.  Caffeinated beverages may be helpful.  If you develop severe nausea and vomiting or a headache that does not go away with flat bed rest, call [phone].  5. You may resume normal activities after your 24 hours of bed rest is over; however, do not exert yourself strongly or do any heavy lifting tomorrow. If when you get up you have a headache when standing, go back to bed and force fluids for another 24 hours.  6. Call your physician for a follow-up appointment.  The results of your myelogram will be sent directly to your physician by the following day.  7. If you have any questions or if complications develop after you arrive home, please call [phone].  Discharge instructions have been explained to the patient.  The patient, or the person responsible for the patient, fully understands these instructions  YOU MAY RESTART YOUR TRAMADOL TOMORROW 05/11/2019 AT 930 AM

## 2019-05-24 ENCOUNTER — Ambulatory Visit (INDEPENDENT_AMBULATORY_CARE_PROVIDER_SITE_OTHER): Payer: Medicare Other | Admitting: Specialist

## 2019-05-24 ENCOUNTER — Encounter: Payer: Self-pay | Admitting: Specialist

## 2019-05-24 ENCOUNTER — Other Ambulatory Visit: Payer: Self-pay

## 2019-05-24 VITALS — BP 139/80 | HR 78 | Ht 72.0 in | Wt 213.0 lb

## 2019-05-24 DIAGNOSIS — M4316 Spondylolisthesis, lumbar region: Secondary | ICD-10-CM | POA: Diagnosis not present

## 2019-05-24 DIAGNOSIS — M5136 Other intervertebral disc degeneration, lumbar region: Secondary | ICD-10-CM

## 2019-05-24 DIAGNOSIS — M48062 Spinal stenosis, lumbar region with neurogenic claudication: Secondary | ICD-10-CM

## 2019-05-24 MED ORDER — DICLOFENAC SODIUM 50 MG PO TBEC: 50.0000 mg | DELAYED_RELEASE_TABLET | Freq: Two times a day (BID) | ORAL | 1 refills | Status: DC

## 2019-05-24 MED ORDER — GABAPENTIN 300 MG PO CAPS: 300.0000 mg | ORAL_CAPSULE | Freq: Three times a day (TID) | ORAL | 2 refills | Status: DC

## 2019-05-24 MED ORDER — HYDROCODONE-ACETAMINOPHEN 5-325 MG PO TABS: 1.0000 | ORAL_TABLET | Freq: Four times a day (QID) | ORAL | 0 refills | Status: DC | PRN

## 2019-05-24 MED ORDER — BACLOFEN 10 MG PO TABS: 10.0000 mg | ORAL_TABLET | Freq: Three times a day (TID) | ORAL | 0 refills | Status: DC

## 2019-05-24 NOTE — Patient Instructions (Addendum)
Avoid bending, stooping and avoid lifting weights greater than 10 lbs. Avoid prolong standing and walking. Order for a new walker with wheels. Surgery scheduling secretary Sherri Billings, will call you in the next week to schedule for surgery.  Surgery recommended is a one level lumbar fusion L4-5 this would be done with rods, screws and cages with local bone graft and allograft (donor bone graft). Take hydrocodone for for pain. Risk of surgery includes risk of infection 1 in 200 patients, bleeding 1/2% chance you would need a transfusion.   Risk to the nerves is one in 10,000. You will need to use a brace for 3 months and wean from the brace on the 4th month. Expect improved walking and standing tolerance. Expect relief of leg pain but numbness may persist depending on the length and degree of pressure that has been present.   

## 2019-05-24 NOTE — Progress Notes (Signed)
Office Visit Note   Patient: Ryan Farmer           Date of Birth: 06-12-1956           MRN: [id number] Visit Date: 05/24/2019              Requested by: Charlott Rakes, MD [place],  [place] [address] PCP: Charlott Rakes, MD   Assessment & Plan: Visit Diagnoses:  1. Spinal stenosis of lumbar region with neurogenic claudication   2. Spondylolisthesis, lumbar region   3. Degenerative disc disease, lumbar     Plan: Avoid bending, stooping and avoid lifting weights greater than 10 lbs. Avoid prolong standing and walking. Order for a new walker with wheels. Surgery scheduling secretary Kandice Hams, will call you in the next week to schedule for surgery.  Surgery recommended is a one level lumbar fusion L4-5 this would be done with rods, screws and cages with local bone graft and allograft (donor bone graft). Take hydrocodone for for pain. Risk of surgery includes risk of infection 1 in 100 patients, bleeding 1/2% chance you would need a transfusion.   Risk to the nerves is one in 10,000. You will need to use a brace for 3 months and wean from the brace on the 4th month. Expect improved walking and standing tolerance. Expect relief of leg pain but numbness may persist depending on the length and degree of pressure that has been present.  Follow-Up Instructions: No follow-ups on file.   Orders:  No orders of the defined types were placed in this encounter.  No orders of the defined types were placed in this encounter.     Procedures: No procedures performed   Clinical Data: No additional findings.   Subjective: Chief Complaint  Patient presents with  . Lower Back - Follow-up    63 year old male with low back pain and radiation into the buttocks and both thighs and legs, both are painful with standing and walking. Some better with sitting but if he sits to long he gets stiff. No bowel or bladder difficulties.    Review of Systems   Constitutional: Negative.   HENT: Negative.   Eyes: Negative.   Respiratory: Positive for cough.   Cardiovascular: Negative.  Negative for chest pain, palpitations and leg swelling.  Gastrointestinal: Negative.  Negative for abdominal distention, abdominal pain, anal bleeding, blood in stool, constipation, diarrhea, nausea, rectal pain and vomiting.  Endocrine: Negative for cold intolerance, heat intolerance, polydipsia, polyphagia and polyuria.  Genitourinary: Negative for difficulty urinating, flank pain, frequency, hematuria and urgency.  Musculoskeletal: Positive for back pain and gait problem. Negative for arthralgias, joint swelling, myalgias, neck pain and neck stiffness.  Skin: Negative for color change, pallor, rash and wound.  Allergic/Immunologic: Negative for environmental allergies, food allergies and immunocompromised state.  Neurological: Positive for dizziness, weakness and numbness. Negative for seizures, facial asymmetry, speech difficulty, light-headedness and headaches.  Hematological: Negative for adenopathy. Does not bruise/bleed easily.  Psychiatric/Behavioral: Negative for agitation, behavioral problems, confusion, decreased concentration, dysphoric mood, hallucinations, self-injury, sleep disturbance and suicidal ideas. The patient is not nervous/anxious and is not hyperactive.      Objective: Vital Signs: BP 139/80 (BP Location: Left Arm, Patient Position: Sitting)   Pulse 78   Ht 6' (1.829 m)   Wt 213 lb (96.6 kg)   BMI 28.89 kg/m   Physical Exam Constitutional:      Appearance: He is well-developed.  HENT:     Head:  Normocephalic and atraumatic.  Eyes:     Pupils: Pupils are equal, round, and reactive to light.  Neck:     Musculoskeletal: Normal range of motion and neck supple.  Pulmonary:     Effort: Pulmonary effort is normal.     Breath sounds: Normal breath sounds.  Abdominal:     General: Bowel sounds are normal.     Palpations: Abdomen is  soft.  Skin:    General: Skin is warm and dry.  Neurological:     Mental Status: He is alert and oriented to person, place, and time.  Psychiatric:        Behavior: Behavior normal.        Thought Content: Thought content normal.        Judgment: Judgment normal.     Back Exam   Tenderness  The patient is experiencing tenderness in the lumbar.  Range of Motion  Extension: abnormal  Flexion: normal  Lateral bend right: normal  Lateral bend left: normal  Rotation right: normal  Rotation left: normal   Muscle Strength  Right Quadriceps:  5/5  Left Quadriceps:  5/5  Right Hamstrings:  5/5  Left Hamstrings:  5/5   Tests  Straight leg raise right: negative Straight leg raise left: negative  Reflexes  Patellar: 1/4 Achilles: 1/4 Babinski's sign: normal   Other  Toe walk: normal Heel walk: normal Sensation: normal Gait: normal  Erythema: no back redness      Specialty Comments:  No specialty comments available.  Imaging: No results found.   PMFS History: Patient Active Problem List   Diagnosis Date Noted  . Type 2 diabetes mellitus with diabetic polyneuropathy, with long-term current use of insulin ([place]) 04/15/2019  . Hypertriglyceridemia 04/15/2019  . Spinal stenosis of lumbar region with neurogenic claudication 03/07/2019  . Lumbar radiculopathy 03/07/2019  . Chronic bilateral low back pain with bilateral sciatica 03/07/2019  . CAD (coronary artery disease), native coronary artery, minimal by cardiac cath 01/08/19 01/09/2019  . Syncope and collapse   . Spondylosis without myelopathy or radiculopathy, lumbar region 04/26/2018  . Cocaine abuse ([place]) 12/19/2016  . Shingles outbreak 12/13/2016  . Diabetes type 2, uncontrolled ([place]) 04/19/2016  . Metatarsalgia 10/19/2015  . Back muscle spasm 11/18/2013  . Hyperlipidemia 11/18/2013  . HTN (hypertension) 11/22/2012  . Chronic pain syndrome 11/22/2012  . Tobacco use disorder 11/22/2012  . Dental caries  11/22/2012   Past Medical History:  Diagnosis Date  . Arthritis    All over  . CAD (coronary artery disease), native coronary artery, minimal by cardiac cath 01/08/19 01/09/2019  . Chronic pain syndrome   . Diabetes mellitus   . History of cocaine use   . History of kidney stones   . Hypercholesteremia   . Hypertension   . Low back pain   . Lumbar radiculopathy   . Right leg pain   . Seborrheic dermatitis of scalp   . Shingles     Family History  Problem Relation Age of Onset  . Cancer Mother   . Stroke Father   . Heart disease Father   . Diabetes Father     Past Surgical History:  Procedure Laterality Date  . CIRCUMCISION N/A 03/16/2018   Procedure: CIRCUMCISION ADULT WITH PENILE BLOCK;  Surgeon: Alexis Frock, MD;  Location: [facility];  Service: Urology;  Laterality: N/A;  58 MINS  . KNEE ARTHROSCOPY Bilateral   . LEFT HEART CATH AND CORONARY ANGIOGRAPHY N/A 01/08/2019   Procedure: LEFT  HEART CATH AND CORONARY ANGIOGRAPHY;  Surgeon: Troy Sine, MD;  Location: [facility];  Service: Cardiovascular;  Laterality: N/A;   Social History   Occupational History  . Not on file  Tobacco Use  . Smoking status: Current Every Day Smoker    Packs/day: 1.00    Years: 50.00    Pack years: 50.00    Types: Cigarettes  . Smokeless tobacco: Former Systems developer    Types: Chew  Substance and Sexual Activity  . Alcohol use: Yes    Comment: occ  . Drug use: Yes    Types: Marijuana, Cocaine    Comment: twice a week  . Sexual activity: Not on file

## 2019-05-24 NOTE — Addendum Note (Signed)
Addended by: Basil Dess on: 05/24/2019 11:35 AM   Modules accepted: Orders

## 2019-06-10 ENCOUNTER — Other Ambulatory Visit: Payer: Self-pay

## 2019-06-11 ENCOUNTER — Ambulatory Visit (INDEPENDENT_AMBULATORY_CARE_PROVIDER_SITE_OTHER): Payer: Medicare Other | Admitting: Internal Medicine

## 2019-06-11 ENCOUNTER — Encounter: Payer: Self-pay | Admitting: Internal Medicine

## 2019-06-11 VITALS — BP 122/68 | HR 77 | Temp 97.8°F | Ht 72.0 in | Wt 227.2 lb

## 2019-06-11 DIAGNOSIS — Z794 Long term (current) use of insulin: Secondary | ICD-10-CM | POA: Diagnosis not present

## 2019-06-11 DIAGNOSIS — E1142 Type 2 diabetes mellitus with diabetic polyneuropathy: Secondary | ICD-10-CM | POA: Diagnosis not present

## 2019-06-11 MED ORDER — NOVOLOG MIX 70/30 FLEXPEN (70-30) 100 UNIT/ML ~~LOC~~ SUPN: 30.0000 [IU] | PEN_INJECTOR | Freq: Two times a day (BID) | SUBCUTANEOUS | 11 refills | Status: DC

## 2019-06-11 MED ORDER — INSULIN PEN NEEDLE 32G X 4 MM MISC: 1.0000 | Freq: Two times a day (BID) | 11 refills | Status: DC

## 2019-06-11 NOTE — Patient Instructions (Addendum)
-   Novolog Mix 30 units with Breakfast and 30 units with Supper - Metformin 1 tablet twice a day     - Check sugars before Breakfast and Supper     - HOW TO TREAT LOW BLOOD SUGARS (Blood sugar LESS THAN 70 MG/DL)  Please follow the RULE OF 15 for the treatment of hypoglycemia treatment (when your (blood sugars are less than 70 mg/dL)    STEP 1: Take 15 grams of carbohydrates when your blood sugar is low, which includes:   3-4 GLUCOSE TABS  OR  3-4 OZ OF JUICE OR REGULAR SODA OR  ONE TUBE OF GLUCOSE GEL     STEP 2: RECHECK blood sugar in 15 MINUTES STEP 3: If your blood sugar is still low at the 15 minute recheck --> then, go back to STEP 1 and treat AGAIN with another 15 grams of carbohydrates.

## 2019-06-11 NOTE — Progress Notes (Signed)
Name: Ryan Farmer  Age/ Sex: 63 y.o., male   MRN/ DOB: [id number], 1955/11/29     PCP: Charlott Rakes, MD   Reason for Endocrinology Evaluation: Type 2 Diabetes Mellitus  Initial Endocrine Consultative Visit: 04/15/2019    PATIENT IDENTIFIER: Ryan Farmer is a 63 y.o. male with a past medical history of CAD, T2DM, Hyperlipidemia and polysubstance abuse. The patient has followed with Endocrinology clinic since 04/15/2019 for consultative assistance with management of his diabetes.  DIABETIC HISTORY:  Mr. Weckwerth was diagnosed with DM in 2011. He has been on SU in the past, has been on metformin since diagnosis, insulin started in 2018. His hemoglobin A1c has ranged from  6.4% in 2017, peaking at 12.6% in 2020.  On his initial visit to our clinic he was on Lantus and Metformin with an A1c 10.4% . We stopped lantus and started a novolog Mix.   Receives intra-articular injections periodically.    Lives with sister, on disability ( can not read or write)    Drinks 12-18 packs a few times a month   SUBJECTIVE:   During the last visit (04/15/2019): A1c 10.4% . We stopped Lantus , continued metformin and started a Novolog Mix .   Today (06/11/2019): Ryan Farmer is here for an 8 week follow up on diabetes management.   He checks his blood sugars 2 times daily, preprandial to breakfast and supper. The patient has not had hypoglycemic episodes since the last clinic visit. Otherwise, the patient has not required any recent emergency interventions for hypoglycemia and has not had recent hospitalizations secondary to hyper or hypoglycemic episodes.    ROS: As per HPI and as detailed below: Review of Systems  Constitutional: Negative for chills and fever.  HENT: Negative for congestion and sore throat.   Respiratory: Negative for cough and shortness of breath.   Cardiovascular: Negative for chest pain and palpitations.      HOME DIABETES REGIMEN:  Novolog Mix 24 units with  Breakfast and 24 units with Supper - takes 30 units  Metformin 1 tablet twice a day     METER DOWNLOAD SUMMARY: Date range evaluated: 11/18-12/07/2018 Fingerstick Blood Glucose Tests = 21 Average Number Tests/Day = 1.5 Overall Mean FS Glucose = 267   BG Ranges: Low = 148 High = 428   Hypoglycemic Events/30 Days: BG < 50 = 0 Episodes of symptomatic severe hypoglycemia = 0    HISTORY:  Past Medical History:  Past Medical History:  Diagnosis Date  . Arthritis    All over  . CAD (coronary artery disease), native coronary artery, minimal by cardiac cath 01/08/19 01/09/2019  . Chronic pain syndrome   . Diabetes mellitus   . History of cocaine use   . History of kidney stones   . Hypercholesteremia   . Hypertension   . Low back pain   . Lumbar radiculopathy   . Right leg pain   . Seborrheic dermatitis of scalp   . Shingles    Past Surgical History:  Past Surgical History:  Procedure Laterality Date  . CIRCUMCISION N/A 03/16/2018   Procedure: CIRCUMCISION ADULT WITH PENILE BLOCK;  Surgeon: Alexis Frock, MD;  Location[facility];  Service: Urology;  Laterality: N/A;  27 MINS  . KNEE ARTHROSCOPY Bilateral   . LEFT HEART CATH AND CORONARY ANGIOGRAPHY N/A 01/08/2019   Procedure: LEFT HEART CATH AND CORONARY ANGIOGRAPHY;  Surgeon: Troy Sine, MD;  Location: [place] [facility];  Service: Cardiovascular;  Laterality: N/A;    Social History:  reports that he has been smoking cigarettes. He has a 50.00 pack-year smoking history. He has quit using smokeless tobacco.  His smokeless tobacco use included chew. He reports current alcohol use. He reports current drug use. Drugs: Marijuana and Cocaine. Family History:  Family History  Problem Relation Age of Onset  . Cancer Mother   . Stroke Father   . Heart disease Father   . Diabetes Father      HOME MEDICATIONS: Allergies as of 06/11/2019   No Known Allergies     Medication List       Accurate as of  June 11, 2019  8:24 AM. If you have any questions, ask your nurse or doctor.        Accu-Chek Aviva Plus w/Device Kit 1 Device by Does not apply route 3 (three) times daily after meals.   Accu-Chek Guide test strip Generic drug: glucose blood Twice daily   accu-chek soft touch lancets Use as instructed to test blood sugar 3 times daily DX E11.42   aspirin 81 MG chewable tablet Chew by mouth daily.   aspirin 81 MG chewable tablet Chew 1 tablet (81 mg total) by mouth daily.   atorvastatin 40 MG tablet Commonly known as: LIPITOR Take 40 mg by mouth daily.   atorvastatin 40 MG tablet Commonly known as: LIPITOR Take 1 tablet (40 mg total) by mouth daily.   baclofen 10 MG tablet Commonly known as: LIORESAL Take 1 tablet (10 mg total) by mouth 2 (two) times daily.   baclofen 10 MG tablet Commonly known as: LIORESAL Take 1 tablet (10 mg total) by mouth 3 (three) times daily.   diclofenac 50 MG EC tablet Commonly known as: VOLTAREN Take 1 tablet (50 mg total) by mouth 2 (two) times daily.   gabapentin 300 MG capsule Commonly known as: NEURONTIN Take 1 capsule (300 mg total) by mouth 3 (three) times daily.   HYDROcodone-acetaminophen 5-325 MG tablet Commonly known as: NORCO/VICODIN Take 1 tablet by mouth every 6 (six) hours as needed for moderate pain.   Insulin Pen Needle 32G X 4 MM Misc 1 Device by Does not apply route 2 (two) times daily with a meal. What changed:   medication strength  See the new instructions. Changed by: Dorita Sciara, MD   lisinopril 2.5 MG tablet Commonly known as: ZESTRIL Take 1 tablet (2.5 mg total) by mouth daily. Kidney protection   metFORMIN 1000 MG tablet Commonly known as: GLUCOPHAGE Take 1,000 mg by mouth 2 (two) times daily with a meal.   metFORMIN 1000 MG tablet Commonly known as: GLUCOPHAGE Take 1 tablet (1,000 mg total) by mouth 2 (two) times daily with a meal.   NovoLOG Mix 70/30 FlexPen (70-30) 100 UNIT/ML  FlexPen Generic drug: insulin aspart protamine - aspart Inject 0.3 mLs (30 Units total) into the skin 2 (two) times daily. What changed: how much to take Changed by: Dorita Sciara, MD        OBJECTIVE:   Vital Signs: BP 122/68 (BP Location: Left Arm, Patient Position: Sitting, Cuff Size: Large)   Pulse 77   Temp 97.8 F (36.6 C)   Ht 6' (1.829 m)   Wt 227 lb 3.2 oz (103.1 kg)   SpO2 98%   BMI 30.81 kg/m   Wt Readings from Last 3 Encounters:  06/11/19 227 lb 3.2 oz (103.1 kg)  05/24/19 213 lb (96.6 kg)  05/01/19 213 lb (96.6 kg)  Exam: General: Pt appears well and is in NAD  Lungs: Clear with good BS bilat with no rales, rhonchi, or wheezes  Heart: RRR with normal S1 and S2 and no gallops; no murmurs; no rub  Abdomen: Normoactive bowel sounds, soft, nontender, without masses or organomegaly palpable  Extremities: No pretibial edema.   Skin: Normal texture and temperature to palpation.   Neuro: MS is good with appropriate affect, pt is alert and Ox3      DM foot exam: 04/15/2019  The skin of the feet is intact without sores or ulcerations. The pedal pulses are 2+ on right and 2+ on left. The sensation is intact to a screening 5.07, 10 gram monofilament bilaterally   DATA REVIEWED:  Lab Results  Component Value Date   HGBA1C 10.4 (A) 04/15/2019   HGBA1C 12.5 (H) 01/08/2019   HGBA1C 12.6 (H) 01/08/2019   Lab Results  Component Value Date   MICROALBUR 150 12/13/2016   LDLCALC UNABLE TO CALCULATE IF TRIGLYCERIDE OVER 400 mg/dL 01/08/2019   CREATININE 0.93 04/15/2019   Lab Results  Component Value Date   MICRALBCREAT 145 (H) 08/07/2018     Lab Results  Component Value Date   CHOL 208 (H) 01/08/2019   HDL 30 (L) 01/08/2019   LDLCALC UNABLE TO CALCULATE IF TRIGLYCERIDE OVER 400 mg/dL 01/08/2019   LDLDIRECT 118.6 (H) 01/08/2019   TRIG 535 (H) 01/08/2019   CHOLHDL 6.9 01/08/2019         ASSESSMENT / PLAN / RECOMMENDATIONS:   1) Type 2   Diabetes Mellitus, Poorly Controlled, With Neuropathy and CAD  complications - Most recent A1c of 10.4 %. Goal A1c < 7.5 %.     - Praised the patient on glucose checks and brining his meter today . He was noted with fluctuating hyperglycemia and euglycemia. Pt admits to skipping his evening dose of insulin because his glucose readings are "good" he doubles up on the dose of Novolog mix up to 60 units sometimes in the morning when his glucose are > 200 mg/dL.  - We discussed the pharmacokinetics of insulin mix and the importance of taking it twice daily with meals to prevent hyperglycemia in the mornings. We also discussed avoiding  Increasing the dose in the future on his own, we discussed the risk of fatality with hypoglycemia.  - Pt brought a humalog pen that was given to him by his friend but he was advised not to use it.  - Pt is awaiting on improved glucose control to proceed with back sx.   Plan: MEDICATIONS:  Novolog Mix 30 units BID with meals   Metformin 1000 mg BID   EDUCATION / INSTRUCTIONS:  BG monitoring instructions: Patient is instructed to check his blood sugars 2 times a day, fasting and supper .  Call [facility] if: BG persistently < 70 or > 300. . I reviewed the Rule of 15 for the treatment of hypoglycemia in detail with the patient. Literature supplied.   2) Hypertriglyceridemia: Patient is on a statin. His TG are very elevated. His alcohol intake is not excessive. Part of this could be hereditary and also due to uncontrolled diabetes. Would recommend repeating lipid profile after improvement of his glucose, if still elevated, would consider adding a  fenofibrate.    F/U in 3 months    Signed electronically by: Mack Guise, MD  [facility]  [facility] [address] [place], [place] [address] Phone: [phone] FAX: [phone]  CC: Charlott Rakes, MD [place]  [address] Phone: [phone]  Fax: [phone]  Return to Endocrinology clinic as below: Future Appointments  Date Time Provider [place]  09/11/2019  7:30 AM Shamleffer, Melanie Crazier, MD [facility] None

## 2019-06-18 ENCOUNTER — Other Ambulatory Visit: Payer: Self-pay | Admitting: Radiology

## 2019-06-18 MED ORDER — BACLOFEN 10 MG PO TABS: 10.0000 mg | ORAL_TABLET | Freq: Three times a day (TID) | ORAL | 0 refills | Status: DC

## 2019-06-20 ENCOUNTER — Telehealth: Payer: Self-pay

## 2019-06-20 ENCOUNTER — Other Ambulatory Visit: Payer: Self-pay | Admitting: Internal Medicine

## 2019-06-20 NOTE — Telephone Encounter (Signed)
MEDICATION: glucose blood (ACCU-CHEK GUIDE) test strip  PHARMACY:  [facility] [id number] - [place], [place] - [address] AT [place] OF [address]  IS THIS A 90 DAY SUPPLY :   IS PATIENT OUT OF MEDICATION: yes   IF NOT; HOW MUCH IS LEFT:   LAST APPOINTMENT DATE: @12 /04/2019  NEXT APPOINTMENT DATE:@3 /09/2019  DO WE HAVE YOUR PERMISSION TO LEAVE A DETAILED MESSAGE:  OTHER COMMENTS:    **Let patient know to contact pharmacy at the end of the day to make sure medication is ready. **  ** Please notify patient to allow 48-72 hours to process**  **Encourage patient to contact the pharmacy for refills or they can request refills through [facility]**

## 2019-06-26 ENCOUNTER — Other Ambulatory Visit: Payer: Self-pay | Admitting: Radiology

## 2019-06-26 ENCOUNTER — Telehealth: Payer: Self-pay

## 2019-06-26 ENCOUNTER — Other Ambulatory Visit: Payer: Self-pay | Admitting: Specialist

## 2019-06-26 MED ORDER — HYDROCODONE-ACETAMINOPHEN 5-325 MG PO TABS: 1.0000 | ORAL_TABLET | Freq: Four times a day (QID) | ORAL | 0 refills | Status: DC | PRN

## 2019-06-26 NOTE — Telephone Encounter (Signed)
This request has been sent to Dr. Nitka 

## 2019-06-26 NOTE — Telephone Encounter (Signed)
Patient came in and wanted a refill for his   Hydro/Acet 5-325. Please advise, thank you.

## 2019-07-11 ENCOUNTER — Other Ambulatory Visit: Payer: Self-pay | Admitting: Specialist

## 2019-07-15 ENCOUNTER — Other Ambulatory Visit: Payer: Self-pay | Admitting: Specialist

## 2019-07-15 MED ORDER — HYDROCODONE-ACETAMINOPHEN 5-325 MG PO TABS: 1.0000 | ORAL_TABLET | Freq: Four times a day (QID) | ORAL | 0 refills | Status: DC | PRN

## 2019-07-15 NOTE — Telephone Encounter (Signed)
Patient called. He wants a refill of hydrocodone and tramadol.  Call back number: [phone]

## 2019-07-15 NOTE — Telephone Encounter (Signed)
I have sent requests to Dr. Louanne Skye

## 2019-07-15 NOTE — Addendum Note (Signed)
Addended by: Minda Ditto, Alyse Low N on: 07/15/2019 11:42 AM   Modules accepted: Orders

## 2019-07-23 ENCOUNTER — Encounter: Payer: Self-pay | Admitting: Physician Assistant

## 2019-07-23 ENCOUNTER — Other Ambulatory Visit: Payer: Self-pay

## 2019-07-23 ENCOUNTER — Ambulatory Visit (INDEPENDENT_AMBULATORY_CARE_PROVIDER_SITE_OTHER): Payer: Medicare Other | Admitting: Physician Assistant

## 2019-07-23 VITALS — BP 131/80 | HR 68 | Temp 97.5°F | Ht 72.0 in | Wt 228.8 lb

## 2019-07-23 DIAGNOSIS — I2510 Atherosclerotic heart disease of native coronary artery without angina pectoris: Secondary | ICD-10-CM

## 2019-07-23 DIAGNOSIS — I10 Essential (primary) hypertension: Secondary | ICD-10-CM

## 2019-07-23 DIAGNOSIS — E119 Type 2 diabetes mellitus without complications: Secondary | ICD-10-CM | POA: Diagnosis not present

## 2019-07-23 DIAGNOSIS — F1410 Cocaine abuse, uncomplicated: Secondary | ICD-10-CM | POA: Diagnosis not present

## 2019-07-23 DIAGNOSIS — J3489 Other specified disorders of nose and nasal sinuses: Secondary | ICD-10-CM

## 2019-07-23 DIAGNOSIS — E785 Hyperlipidemia, unspecified: Secondary | ICD-10-CM | POA: Diagnosis not present

## 2019-07-23 DIAGNOSIS — Z720 Tobacco use: Secondary | ICD-10-CM

## 2019-07-23 MED ORDER — BENZONATATE 100 MG PO CAPS: 100.0000 mg | ORAL_CAPSULE | Freq: Three times a day (TID) | ORAL | 0 refills | Status: DC | PRN

## 2019-07-23 MED ORDER — LORATADINE 10 MG PO TABS: 10.0000 mg | ORAL_TABLET | Freq: Every day | ORAL | 1 refills | Status: DC

## 2019-07-23 NOTE — Patient Instructions (Signed)
Medication Instructions:   START CLARITIN 10 MG DAILY  START TESSALON 100 MG 3 TIMES A DAY AS NEEDED  *If you need a refill on your cardiac medications before your next appointment, please call your pharmacy*  Lab Work: Your physician recommends that you return for lab work TODAY:  FASTING LIPID PANEL  HEPATIC (LIVER) FUNCTION  If you have labs (blood work) drawn today and your tests are completely normal, you will receive your results only by: Marland Kitchen MyChart Message (if you have MyChart) OR . A paper copy in the mail If you have any lab test that is abnormal or we need to change your treatment, we will call you to review the results.  Testing/Procedures:  NONE ordered at this time of appointment   Follow-Up: At [facility], you and your health needs are our priority.  As part of our continuing mission to provide you with exceptional heart care, we have created designated Provider Care Teams.  These Care Teams include your primary Cardiologist (physician) and Advanced Practice Providers (APPs -  Physician Assistants and Nurse Practitioners) who all work together to provide you with the care you need, when you need it.  Your next appointment:   6 month(s)  The format for your next appointment:   In Person  Provider:   Shelva Majestic, MD  Other Instructions

## 2019-07-23 NOTE — Progress Notes (Signed)
Cardiology Office Note:    Date:  07/25/2019   ID:  [name], DOB [birth year], MRN [id number]  PCP:  Charlott Rakes, MD  Cardiologist:  Shelva Majestic, MD  Electrophysiologist:  None   Referring MD: Charlott Rakes, MD   Chief Complaint  Patient presents with  . Follow-up    seen for Dr. Claiborne Billings.     History of Present Illness:    Ryan Farmer is a 64 y.o. male with a hx of DM 2, hypertension, hyperlipidemia and minimal CAD on cardiac catheterization in June 2020.  He had a syncopal episode in June 2020, this led to emergent cardiac catheterization because EKG at the time showed 1 mm ST elevation in the inferior leads.  Cardiac catheterization however only revealed minimal CAD and a normal EF.  It was felt he likely had coronary spasm.  Patient had a prior history of cocaine abuse which can potentially induce coronary spasm.  Urine drug test was not completed during hospitalization.  Hemoglobin A1c was found to be quite elevated to 12.6 at the time.  He was on insulin at home prior to arrival.  He was last seen by Bunnie Domino, NP on 01/16/2019, at which time he was doing well after hospital discharge.  Patient presents today for cardiology office visit. He has not had any syncopal episode.  According to patient, he has not touched cocaine in the past 6 months.  He does occasionally still use marijuana.  I encouraged him to continue to follow-up with his primary care provider to help manage diabetes.  Otherwise he has no lower extremity edema, orthopnea or PND.  He does not have any heart murmur on physical exam either.  Overall he is doing quite well from the cardiology perspective.  His only complaint is sinus drainage and cough.  I encouraged him to use Claritin, he asked for prescription to see if it will be covered by Medicaid.  He also requested some Tessalon Perle, and I recommended he only use the Tessalon Perle if it interferes with his sleep.  He is overdue for fasting lipid  panel and liver function test.  He can follow-up with Dr. Claiborne Billings in 6 months.   Past Medical History:  Diagnosis Date  . Arthritis    All over  . CAD (coronary artery disease), native coronary artery, minimal by cardiac cath 01/08/19 01/09/2019  . Chronic pain syndrome   . Diabetes mellitus   . History of cocaine use   . History of kidney stones   . Hypercholesteremia   . Hypertension   . Low back pain   . Lumbar radiculopathy   . Right leg pain   . Seborrheic dermatitis of scalp   . Shingles     Past Surgical History:  Procedure Laterality Date  . CIRCUMCISION N/A 03/16/2018   Procedure: CIRCUMCISION ADULT WITH PENILE BLOCK;  Surgeon: Alexis Frock, MD;  Location: [facility];  Service: Urology;  Laterality: N/A;  82 MINS  . KNEE ARTHROSCOPY Bilateral   . LEFT HEART CATH AND CORONARY ANGIOGRAPHY N/A 01/08/2019   Procedure: LEFT HEART CATH AND CORONARY ANGIOGRAPHY;  Surgeon: Troy Sine, MD;  Location: [place] [facility];  Service: Cardiovascular;  Laterality: N/A;    Current Medications: Current Meds  Medication Sig  . aspirin 81 MG chewable tablet Chew 1 tablet (81 mg total) by mouth daily.  Marland Kitchen aspirin 81 MG chewable tablet Chew by mouth daily.  Marland Kitchen atorvastatin (LIPITOR) 40 MG tablet Take  1 tablet (40 mg total) by mouth daily.  Marland Kitchen atorvastatin (LIPITOR) 40 MG tablet Take 40 mg by mouth daily.  . baclofen (LIORESAL) 10 MG tablet Take 1 tablet (10 mg total) by mouth 2 (two) times daily.  . baclofen (LIORESAL) 10 MG tablet Take 1 tablet (10 mg total) by mouth 3 (three) times daily.  . Blood Glucose Monitoring Suppl (ACCU-CHEK AVIVA PLUS) w/Device KIT 1 Device by Does not apply route 3 (three) times daily after meals.  . diclofenac (VOLTAREN) 50 MG EC tablet Take 1 tablet (50 mg total) by mouth 2 (two) times daily.  Marland Kitchen gabapentin (NEURONTIN) 300 MG capsule Take 1 capsule (300 mg total) by mouth 3 (three) times daily.  Marland Kitchen glucose blood (ACCU-CHEK GUIDE) test strip TEST  TWICE DAILY AS DIRECTED  . HYDROcodone-acetaminophen (NORCO/VICODIN) 5-325 MG tablet Take 1 tablet by mouth every 6 (six) hours as needed for moderate pain.  Marland Kitchen insulin aspart protamine - aspart (NOVOLOG MIX 70/30 FLEXPEN) (70-30) 100 UNIT/ML FlexPen Inject 0.3 mLs (30 Units total) into the skin 2 (two) times daily.  . Insulin Pen Needle 32G X 4 MM MISC 1 Device by Does not apply route 2 (two) times daily with a meal.  . Lancets (ACCU-CHEK SOFT TOUCH) lancets Use as instructed to test blood sugar 3 times daily DX E11.42  . lisinopril (ZESTRIL) 2.5 MG tablet Take 1 tablet (2.5 mg total) by mouth daily. Kidney protection  . metFORMIN (GLUCOPHAGE) 1000 MG tablet Take 1 tablet (1,000 mg total) by mouth 2 (two) times daily with a meal.  . metFORMIN (GLUCOPHAGE) 1000 MG tablet Take 1,000 mg by mouth 2 (two) times daily with a meal.     Allergies:   Patient has no known allergies.   Social History   Socioeconomic History  . Marital status: Single    Spouse name: Not on file  . Number of children: Not on file  . Years of education: Not on file  . Highest education level: Not on file  Occupational History  . Not on file  Tobacco Use  . Smoking status: Current Every Day Smoker    Packs/day: 1.00    Years: 50.00    Pack years: 50.00    Types: Cigarettes  . Smokeless tobacco: Former Systems developer    Types: Chew  Substance and Sexual Activity  . Alcohol use: Yes    Comment: occ  . Drug use: Yes    Types: Marijuana, Cocaine    Comment: twice a week  . Sexual activity: Not on file  Other Topics Concern  . Not on file  Social History Narrative  . Not on file   Social Determinants of Health   Financial Resource Strain:   . Difficulty of Paying Living Expenses: Not on file  Food Insecurity:   . Worried About Charity fundraiser in the Last Year: Not on file  . Ran Out of Food in the Last Year: Not on file  Transportation Needs:   . Lack of Transportation (Medical): Not on file  . Lack of  Transportation (Non-Medical): Not on file  Physical Activity: Unknown  . Days of Exercise per Week: 0 days  . Minutes of Exercise per Session: Not on file  Stress: Stress Concern Present  . Feeling of Stress : To some extent  Social Connections: Unknown  . Frequency of Communication with Friends and Family: Patient refused  . Frequency of Social Gatherings with Friends and Family: Patient refused  . Attends Religious Services: Patient  refused  . Active Member of Clubs or Organizations: Patient refused  . Attends Archivist Meetings: Patient refused  . Marital Status: Patient refused     Family History: The patient's family history includes Cancer in his mother; Diabetes in his father; Heart disease in his father; Stroke in his father.  ROS:   Please see the history of present illness.     All other systems reviewed and are negative.  EKGs/Labs/Other Studies Reviewed:    The following studies were reviewed today:  Cath 01/08/2019  Prox RCA-1 lesion is 20% stenosed.  Prox RCA-2 lesion is 20% stenosed.  Dist RCA lesion is 20% stenosed.  Dist LAD lesion is 10% stenosed.  Mid LAD lesion is 20% stenosed.  The left ventricular systolic function is normal.  LV end diastolic pressure is normal.   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  The left circumflex coronary artery is normal.  The right coronary artery has mild 20% proximal mid stenosis and a small area of muscle bridging in the distal vessel proximal to the PDA takeoff.  Normal LV function with EF 55% without focal segmental wall motion abnormalities.  LVEDP 14 mm.  RECOMMENDATION: The patient denied any chest pain prior to his syncopal spell.  He had transient 1 mm inferior ST elevation with resolution on subsequent ECG suggesting potential transient spasm.  Patient was  hypotensive requiring fluid bolus and levophed.  At the completion of the procedure blood pressure was 111/67 on a reduced dose of Levophed with plans to wean as blood pressure allows.   EKG:  EKG is ordered today.  The ekg ordered today demonstrates normal sinus rhythm without significant ST-T wave changes  Recent Labs: 01/08/2019: Hemoglobin 15.6; Platelets 177 04/15/2019: BUN 24; Creatinine, Ser 0.93; Potassium 4.7; Sodium 134 07/23/2019: ALT 82  Recent Lipid Panel    Component Value Date/Time   CHOL 125 07/23/2019 0907   TRIG 248 (H) 07/23/2019 0907   HDL 29 (L) 07/23/2019 0907   CHOLHDL 4.3 07/23/2019 0907   CHOLHDL 6.9 01/08/2019 1834   VLDL UNABLE TO CALCULATE IF TRIGLYCERIDE OVER 400 mg/dL 01/08/2019 1834   LDLCALC 56 07/23/2019 0907   LDLDIRECT 118.6 (H) 01/08/2019 1834    Physical Exam:    VS:  BP 131/80   Pulse 68   Temp (!) 97.5 F (36.4 C)   Ht 6' (1.829 m)   Wt 228 lb 12.8 oz (103.8 kg)   SpO2 96%   BMI 31.03 kg/m     Wt Readings from Last 3 Encounters:  07/23/19 228 lb 12.8 oz (103.8 kg)  06/11/19 227 lb 3.2 oz (103.1 kg)  05/24/19 213 lb (96.6 kg)     GEN:  Well nourished, well developed in no acute distress HEENT: Normal NECK: No JVD; No carotid bruits LYMPHATICS: No lymphadenopathy CARDIAC: RRR, no murmurs, rubs, gallops RESPIRATORY:  Clear to auscultation without rales, wheezing or rhonchi  ABDOMEN: Soft, non-tender, non-distended MUSCULOSKELETAL:  No edema; No deformity  SKIN: Warm and dry NEUROLOGIC:  Alert and oriented x 3 PSYCHIATRIC:  Normal affect   ASSESSMENT:    1. Coronary artery disease involving native coronary artery of native heart without angina pectoris   2. Hyperlipidemia, unspecified hyperlipidemia type   3. Cocaine abuse ([place])   4. Controlled type 2 diabetes mellitus without complication, without long-term current use of insulin (  [place])   5. Essential hypertension   6. Tobacco abuse   7. Sinus drainage    PLAN:    In  order of problems listed above:  1. CAD: Recent cardiac catheterization showed mild disease.  Tobacco cessation strongly advised.  Continue aspirin and Lipitor  2. Hyperlipidemia: On Lipitor 40 mg daily.  LDL goal less than 70  3. Cocaine abuse: Fortunately he has quit cocaine for the past several months.  4. DM2: Managed by primary care provider  5. Hypertension: Blood pressure stable on current therapy  6. Sinus drainage: Suspect he has allergies.  I recommend antihistamine.  He can take as needed dose of Tessalon Perles for occasional cough.  He should continue to have yearly physical with his primary care provider and regular screening for lung cancer given his history of smoking.  7. Tobacco abuse: Smoking cessation strongly advised.   Medication Adjustments/Labs and Tests Ordered: Current medicines are reviewed at length with the patient today.  Concerns regarding medicines are outlined above.  Orders Placed This Encounter  Procedures  . Hepatic function panel  . Lipid panel  . EKG 12-Lead   Meds ordered this encounter  Medications  . benzonatate (TESSALON PERLES) 100 MG capsule    Sig: Take 1 capsule (100 mg total) by mouth 3 (three) times daily as needed for cough.    Dispense:  20 capsule    Refill:  0  . loratadine (CLARITIN) 10 MG tablet    Sig: Take 1 tablet (10 mg total) by mouth daily.    Dispense:  90 tablet    Refill:  1    Patient Instructions  Medication Instructions:   START CLARITIN 10 MG DAILY  START TESSALON 100 MG 3 TIMES A DAY AS NEEDED  *If you need a refill on your cardiac medications before your next appointment, please call your pharmacy*  Lab Work: Your physician recommends that you return for lab work TODAY:  FASTING LIPID PANEL  HEPATIC (LIVER) FUNCTION  If you have labs (blood work) drawn today and your tests are completely normal, you will receive your results only by: Marland Kitchen MyChart Message (if you have MyChart) OR . A paper copy in  the mail If you have any lab test that is abnormal or we need to change your treatment, we will call you to review the results.  Testing/Procedures:  NONE ordered at this time of appointment   Follow-Up: At [facility], you and your health needs are our priority.  As part of our continuing mission to provide you with exceptional heart care, we have created designated Provider Care Teams.  These Care Teams include your primary Cardiologist (physician) and Advanced Practice Providers (APPs -  Physician Assistants and Nurse Practitioners) who all work together to provide you with the care you need, when you need it.  Your next appointment:   6 month(s)  The format for your next appointment:   In Person  Provider:   Shelva Majestic, MD  Other Instructions      Signed, Almyra Deforest, [place]  07/25/2019 11:04 PM    [place]

## 2019-07-24 LAB — LIPID PANEL
Chol/HDL Ratio: 4.3 ratio (ref 0.0–5.0)
Cholesterol, Total: 125 mg/dL (ref 100–199)
HDL: 29 mg/dL — ABNORMAL LOW
LDL Chol Calc (NIH): 56 mg/dL (ref 0–99)
Triglycerides: 248 mg/dL — ABNORMAL HIGH (ref 0–149)
VLDL Cholesterol Cal: 40 mg/dL (ref 5–40)

## 2019-07-24 LAB — HEPATIC FUNCTION PANEL
ALT: 82 IU/L — ABNORMAL HIGH (ref 0–44)
AST: 35 IU/L (ref 0–40)
Albumin: 4.8 g/dL (ref 3.8–4.8)
Alkaline Phosphatase: 85 IU/L (ref 39–117)
Bilirubin Total: 0.2 mg/dL (ref 0.0–1.2)
Bilirubin, Direct: 0.08 mg/dL (ref 0.00–0.40)
Total Protein: 6.7 g/dL (ref 6.0–8.5)

## 2019-07-25 ENCOUNTER — Encounter: Payer: Self-pay | Admitting: Physician Assistant

## 2019-07-31 ENCOUNTER — Other Ambulatory Visit: Payer: Self-pay

## 2019-07-31 MED ORDER — OMEGA-3-ACID ETHYL ESTERS 1 G PO CAPS: 1.0000 g | ORAL_CAPSULE | Freq: Two times a day (BID) | ORAL | 6 refills | Status: DC

## 2019-08-02 ENCOUNTER — Other Ambulatory Visit: Payer: Self-pay

## 2019-08-02 ENCOUNTER — Telehealth: Payer: Self-pay | Admitting: Physician Assistant

## 2019-08-02 ENCOUNTER — Other Ambulatory Visit: Payer: Self-pay | Admitting: Specialist

## 2019-08-02 DIAGNOSIS — R7989 Other specified abnormal findings of blood chemistry: Secondary | ICD-10-CM

## 2019-08-02 MED ORDER — OMEGA-3-ACID ETHYL ESTERS 1 G PO CAPS: 1.0000 g | ORAL_CAPSULE | Freq: Two times a day (BID) | ORAL | 11 refills | Status: DC

## 2019-08-02 NOTE — Telephone Encounter (Signed)
Spoke with patient and reviewed instructions from labs. Patient is going to get Lovaza card from pharmacist that will help lower cost. He will have follow up LFT's in 3 months

## 2019-08-02 NOTE — Telephone Encounter (Signed)
Patient called in regards to some medication. He saw Almyra Deforest on 01-12 and had labs drawn. He got a call about his lab results and was told he had high cholesterol, as well as something wrong with his liver and kidneys. He went to the pharmacy to pick up a medication yesterday but was told the medicine was for his cholesterol. He was under the impression that the medication would be for his Kidneys or Liver. He did not have the bottle of medication in front of him, so it could not be determined what the new medication was. He just wants a Nurse to call him and make sure he got the right medicine.   He also mentioned that his Medicaid did not cover that specific medication, and it was $75 for him out of pocket. He will pay if he needs to, but if there is  A cheaper alternative that his Medicaid would cover he would prefer that

## 2019-08-05 ENCOUNTER — Other Ambulatory Visit: Payer: Self-pay

## 2019-08-06 ENCOUNTER — Telehealth: Payer: Self-pay | Admitting: Physical Medicine and Rehabilitation

## 2019-08-06 NOTE — Telephone Encounter (Signed)
Should follow with [place]

## 2019-08-06 NOTE — Telephone Encounter (Signed)
Called patient to advise  °

## 2019-08-29 ENCOUNTER — Other Ambulatory Visit: Payer: Self-pay | Admitting: Cardiology

## 2019-08-29 ENCOUNTER — Other Ambulatory Visit: Payer: Self-pay | Admitting: Specialist

## 2019-08-29 DIAGNOSIS — R809 Proteinuria, unspecified: Secondary | ICD-10-CM

## 2019-08-29 DIAGNOSIS — E1129 Type 2 diabetes mellitus with other diabetic kidney complication: Secondary | ICD-10-CM

## 2019-08-29 NOTE — Telephone Encounter (Signed)
Please advise 

## 2019-08-30 ENCOUNTER — Other Ambulatory Visit: Payer: Self-pay

## 2019-08-30 MED ORDER — OMEGA-3-ACID ETHYL ESTERS 1 G PO CAPS: 1.0000 g | ORAL_CAPSULE | Freq: Two times a day (BID) | ORAL | 11 refills | Status: DC

## 2019-09-03 ENCOUNTER — Other Ambulatory Visit: Payer: Self-pay

## 2019-09-03 MED ORDER — HYDROCODONE-ACETAMINOPHEN 5-325 MG PO TABS: 1.0000 | ORAL_TABLET | Freq: Four times a day (QID) | ORAL | 0 refills | Status: DC | PRN

## 2019-09-03 NOTE — Telephone Encounter (Signed)
Pt came into the office this morning requesting a refill on his hydrocodone.  Please advise

## 2019-09-03 NOTE — Addendum Note (Signed)
Addended by: Minda Ditto, Geoffery Spruce on: 09/03/2019 08:55 AM   Modules accepted: Orders

## 2019-09-10 NOTE — Progress Notes (Deleted)
Name: Ryan Farmer  Age/ Sex: 64 y.o., male   MRN/ DOB: [id number], 01/11/1956     PCP: Charlott Rakes, MD   Reason for Endocrinology Evaluation: Type 2 Diabetes Mellitus  Initial Endocrine Consultative Visit: 04/15/2019    PATIENT IDENTIFIER: Ryan Farmer is a 64 y.o. male with a past medical history of CAD, T2DM, Hyperlipidemia and polysubstance abuse. The patient has followed with Endocrinology clinic since 04/15/2019 for consultative assistance with management of his diabetes.  DIABETIC HISTORY:  Ryan Farmer was diagnosed with DM in 2011. He has been on SU in the past, has been on metformin since diagnosis, insulin started in 2018. His hemoglobin A1c has ranged from  6.4% in 2017, peaking at 12.6% in 2020.  On his initial visit to our clinic he was on Lantus and Metformin with an A1c 10.4% . We stopped lantus and started a novolog Mix.   Receives intra-articular injections periodically.    Lives with sister, on disability ( can not read or write)    Drinks 12-18 packs a few times a month   SUBJECTIVE:   During the last visit (06/11/2019): We continued metformin and novolog mix   Today (09/11/2019): Ryan Farmer is here for an 8 week follow up on diabetes management.   He checks his blood sugars 2 times daily, preprandial to breakfast and supper. The patient has not had hypoglycemic episodes since the last clinic visit. Otherwise, the patient has not required any recent emergency interventions for hypoglycemia and has not had recent hospitalizations secondary to hyper or hypoglycemic episodes.    ROS: As per HPI and as detailed below: Review of Systems  Constitutional: Negative for chills and fever.  HENT: Negative for congestion and sore throat.   Respiratory: Negative for cough and shortness of breath.   Cardiovascular: Negative for chest pain and palpitations.      HOME DIABETES REGIMEN:  Novolog Mix 24 units with Breakfast and 24 units with Supper - takes 30  units  Metformin 1 tablet twice a day     METER DOWNLOAD SUMMARY: Date range evaluated: 11/18-12/07/2018 Fingerstick Blood Glucose Tests = 21 Average Number Tests/Day = 1.5 Overall Mean FS Glucose = 267   BG Ranges: Low = 148 High = 428   Hypoglycemic Events/30 Days: BG < 50 = 0 Episodes of symptomatic severe hypoglycemia = 0    HISTORY:  Past Medical History:  Past Medical History:  Diagnosis Date  . Arthritis    All over  . CAD (coronary artery disease), native coronary artery, minimal by cardiac cath 01/08/19 01/09/2019  . Chronic pain syndrome   . Diabetes mellitus   . History of cocaine use   . History of kidney stones   . Hypercholesteremia   . Hypertension   . Low back pain   . Lumbar radiculopathy   . Right leg pain   . Seborrheic dermatitis of scalp   . Shingles    Past Surgical History:  Past Surgical History:  Procedure Laterality Date  . CIRCUMCISION N/A 03/16/2018   Procedure: CIRCUMCISION ADULT WITH PENILE BLOCK;  Surgeon: Alexis Frock, MD;  Location: [facility];  Service: Urology;  Laterality: N/A;  15 MINS  . KNEE ARTHROSCOPY Bilateral   . LEFT HEART CATH AND CORONARY ANGIOGRAPHY N/A 01/08/2019   Procedure: LEFT HEART CATH AND CORONARY ANGIOGRAPHY;  Surgeon: Troy Sine, MD;  Location: [place] [facility];  Service: Cardiovascular;  Laterality: N/A;    Social History:  reports that he has been smoking cigarettes. He has a 50.00 pack-year smoking history. He has quit using smokeless tobacco.  His smokeless tobacco use included chew. He reports current alcohol use. He reports current drug use. Drugs: Marijuana and Cocaine. Family History:  Family History  Problem Relation Age of Onset  . Cancer Mother   . Stroke Father   . Heart disease Father   . Diabetes Father      HOME MEDICATIONS: Allergies as of 09/11/2019   No Known Allergies     Medication List       Accurate as of September 11, 2019  7:28 AM. If you have any  questions, ask your nurse or doctor.        Accu-Chek Aviva Plus w/Device Kit 1 Device by Does not apply route 3 (three) times daily after meals.   Accu-Chek Guide test strip Generic drug: glucose blood TEST TWICE DAILY AS DIRECTED   accu-chek soft touch lancets Use as instructed to test blood sugar 3 times daily DX E11.42   aspirin 81 MG chewable tablet Chew by mouth daily.   aspirin 81 MG chewable tablet Chew 1 tablet (81 mg total) by mouth daily.   atorvastatin 40 MG tablet Commonly known as: LIPITOR Take 40 mg by mouth daily.   atorvastatin 40 MG tablet Commonly known as: LIPITOR Take 1 tablet (40 mg total) by mouth daily.   baclofen 10 MG tablet Commonly known as: LIORESAL Take 1 tablet (10 mg total) by mouth 3 (three) times daily.   baclofen 10 MG tablet Commonly known as: LIORESAL Take 1 tablet (10 mg total) by mouth 2 (two) times daily.   benzonatate 100 MG capsule Commonly known as: Tessalon Perles Take 1 capsule (100 mg total) by mouth 3 (three) times daily as needed for cough.   diclofenac 50 MG EC tablet Commonly known as: VOLTAREN TAKE 1 TABLET(50 MG) BY MOUTH TWICE DAILY   gabapentin 300 MG capsule Commonly known as: NEURONTIN Take 1 capsule (300 mg total) by mouth 3 (three) times daily.   HYDROcodone-acetaminophen 5-325 MG tablet Commonly known as: NORCO/VICODIN Take 1 tablet by mouth every 6 (six) hours as needed for moderate pain.   Insulin Pen Needle 32G X 4 MM Misc 1 Device by Does not apply route 2 (two) times daily with a meal.   lisinopril 2.5 MG tablet Commonly known as: ZESTRIL Take 1 tablet (2.5 mg total) by mouth daily. Kidney protection   loratadine 10 MG tablet Commonly known as: Claritin Take 1 tablet (10 mg total) by mouth daily.   metFORMIN 1000 MG tablet Commonly known as: GLUCOPHAGE Take 1,000 mg by mouth 2 (two) times daily with a meal.   metFORMIN 1000 MG tablet Commonly known as: GLUCOPHAGE Take 1 tablet (1,000  mg total) by mouth 2 (two) times daily with a meal.   NovoLOG Mix 70/30 FlexPen (70-30) 100 UNIT/ML FlexPen Generic drug: insulin aspart protamine - aspart Inject 0.3 mLs (30 Units total) into the skin 2 (two) times daily.   omega-3 acid ethyl esters 1 g capsule Commonly known as: Lovaza Take 1 capsule (1 g total) by mouth 2 (two) times daily.        OBJECTIVE:   Vital Signs: There were no vitals taken for this visit.  Wt Readings from Last 3 Encounters:  07/23/19 228 lb 12.8 oz (103.8 kg)  06/11/19 227 lb 3.2 oz (103.1 kg)  05/24/19 213 lb (96.6 kg)     Exam: General: Pt appears  well and is in NAD  Lungs: Clear with good BS bilat with no rales, rhonchi, or wheezes  Heart: RRR with normal S1 and S2 and no gallops; no murmurs; no rub  Abdomen: Normoactive bowel sounds, soft, nontender, without masses or organomegaly palpable  Extremities: No pretibial edema.   Skin: Normal texture and temperature to palpation.   Neuro: MS is good with appropriate affect, pt is alert and Ox3      DM foot exam: 04/15/2019  The skin of the feet is intact without sores or ulcerations. The pedal pulses are 2+ on right and 2+ on left. The sensation is intact to a screening 5.07, 10 gram monofilament bilaterally   DATA REVIEWED:  Lab Results  Component Value Date   HGBA1C 10.4 (A) 04/15/2019   HGBA1C 12.5 (H) 01/08/2019   HGBA1C 12.6 (H) 01/08/2019   Lab Results  Component Value Date   MICROALBUR 150 12/13/2016   LDLCALC 56 07/23/2019   CREATININE 0.93 04/15/2019   Lab Results  Component Value Date   MICRALBCREAT 145 (H) 08/07/2018     Lab Results  Component Value Date   CHOL 125 07/23/2019   HDL 29 (L) 07/23/2019   LDLCALC 56 07/23/2019   LDLDIRECT 118.6 (H) 01/08/2019   TRIG 248 (H) 07/23/2019   CHOLHDL 4.3 07/23/2019         ASSESSMENT / PLAN / RECOMMENDATIONS:   1) Type 2  Diabetes Mellitus, Poorly Controlled, With Neuropathy and CAD  complications - Most recent  A1c of 10.4 %. Goal A1c < 7.5 %.     - Praised the patient on glucose checks and brining his meter today . He was noted with fluctuating hyperglycemia and euglycemia. Pt admits to skipping his evening dose of insulin because his glucose readings are "good" he doubles up on the dose of Novolog mix up to 60 units sometimes in the morning when his glucose are > 200 mg/dL.  - We discussed the pharmacokinetics of insulin mix and the importance of taking it twice daily with meals to prevent hyperglycemia in the mornings. We also discussed avoiding  Increasing the dose in the future on his own, we discussed the risk of fatality with hypoglycemia.  - Pt brought a humalog pen that was given to him by his friend but he was advised not to use it.  - Pt is awaiting on improved glucose control to proceed with back sx.   Plan: MEDICATIONS:  Novolog Mix 30 units BID with meals   Metformin 1000 mg BID   EDUCATION / INSTRUCTIONS:  BG monitoring instructions: Patient is instructed to check his blood sugars 2 times a day, fasting and supper .  Call [facility] if: BG persistently < 70 or > 300. . I reviewed the Rule of 15 for the treatment of hypoglycemia in detail with the patient. Literature supplied.   2) Hypertriglyceridemia: Patient is on a statin. His TG are very elevated. His alcohol intake is not excessive. Part of this could be hereditary and also due to uncontrolled diabetes. Would recommend repeating lipid profile after improvement of his glucose, if still elevated, would consider adding a  fenofibrate.    F/U in 3 months    Signed electronically by: Mack Guise, MD  [facility]  [address] [place], [place] [address] Phone: [phone] FAX: [phone]   CC: Charlott Rakes, [place] [address] Phone: [phone]  Fax: [phone]  Return  to Endocrinology clinic as  below: Future Appointments  Date Time Provider [place]  09/11/2019  7:30 AM Ezequiel Macauley, Melanie Crazier, MD [id number] None

## 2019-09-11 ENCOUNTER — Ambulatory Visit: Payer: Medicare Other | Admitting: Internal Medicine

## 2019-09-16 ENCOUNTER — Telehealth: Payer: Self-pay | Admitting: *Deleted

## 2019-09-16 NOTE — Telephone Encounter (Signed)
Pt called asking for an appt to see Dr. Ernestina Patches for injection, per Dr. Ernestina Patches patient needs to follow up with Dr. Louanne Skye. Advised patient and he understood and agreed and will call to get appt scheduled.

## 2019-09-18 ENCOUNTER — Telehealth: Payer: Self-pay

## 2019-09-18 NOTE — Telephone Encounter (Signed)
I have added him to the cancellation list 

## 2019-09-18 NOTE — Telephone Encounter (Signed)
Patient made appt for 10/10/2019. He would like to be on the cancellation list. I did advise him he can call us everyday to see if any one has cx. He prefers we call him instead since he never can get through to someone

## 2019-09-30 ENCOUNTER — Encounter: Payer: Self-pay | Admitting: Specialist

## 2019-09-30 ENCOUNTER — Other Ambulatory Visit: Payer: Self-pay

## 2019-09-30 ENCOUNTER — Ambulatory Visit (INDEPENDENT_AMBULATORY_CARE_PROVIDER_SITE_OTHER): Payer: Medicare Other | Admitting: Specialist

## 2019-09-30 VITALS — BP 128/86 | HR 78 | Ht 72.0 in | Wt 213.0 lb

## 2019-09-30 DIAGNOSIS — M4316 Spondylolisthesis, lumbar region: Secondary | ICD-10-CM

## 2019-09-30 DIAGNOSIS — Z794 Long term (current) use of insulin: Secondary | ICD-10-CM | POA: Diagnosis not present

## 2019-09-30 DIAGNOSIS — M48062 Spinal stenosis, lumbar region with neurogenic claudication: Secondary | ICD-10-CM | POA: Diagnosis not present

## 2019-09-30 DIAGNOSIS — I2510 Atherosclerotic heart disease of native coronary artery without angina pectoris: Secondary | ICD-10-CM | POA: Diagnosis not present

## 2019-09-30 DIAGNOSIS — E11610 Type 2 diabetes mellitus with diabetic neuropathic arthropathy: Secondary | ICD-10-CM | POA: Diagnosis not present

## 2019-09-30 DIAGNOSIS — M5416 Radiculopathy, lumbar region: Secondary | ICD-10-CM | POA: Diagnosis not present

## 2019-09-30 DIAGNOSIS — M4726 Other spondylosis with radiculopathy, lumbar region: Secondary | ICD-10-CM

## 2019-09-30 DIAGNOSIS — M5136 Other intervertebral disc degeneration, lumbar region: Secondary | ICD-10-CM | POA: Diagnosis not present

## 2019-09-30 MED ORDER — HYDROCODONE-ACETAMINOPHEN 5-325 MG PO TABS: 1.0000 | ORAL_TABLET | Freq: Four times a day (QID) | ORAL | 0 refills | Status: DC | PRN

## 2019-09-30 NOTE — Progress Notes (Signed)
Office Visit Note   Patient: Ryan Farmer           Date of Birth: 1956/01/04           MRN: [id number] Visit Date: 09/30/2019              Requested by: Charlott Rakes, MD [place],  [place] [address] PCP: Charlott Rakes, MD   Assessment & Plan: Visit Diagnoses:  1. Spinal stenosis of lumbar region with neurogenic claudication   2. Spondylolisthesis, lumbar region   3. Degenerative disc disease, lumbar   4. Lumbar radiculopathy   5. Other spondylosis with radiculopathy, lumbar region   6. Type 2 diabetes mellitus with diabetic neuropathic arthropathy, with long-term current use of insulin ([place])   Surgery is a consideration but his blood sugars have prevented a surgical solution. I will have him contact us when his blood sugar is under contol and we with check a HGbA1c today. I will not prescribe narcotics beyond today's visit as there is no surgical solution until his medical  Status is stable.   Plan: Plan: Avoid bending, stooping and avoid lifting weights greater than 10 lbs. Avoid prolong standing and walking. Order for a new walker with wheels. Surgery scheduling secretary Kandice Hams, will call you in the next week to schedule for surgery.  Surgery recommended is a one level lumbar fusion L4-5 this would be done with rods, screws and cages with local bone graft and allograft (donor bone graft). Take hydrocodone for for pain. Risk of surgery includes risk of infection 1 in 100 patients, bleeding 1/2% chance you would need a transfusion.   Risk to the nerves is one in 10,000. You will need to use a brace for 3 months and wean from the brace on the 4th month. Expect improved walking and standing tolerance. Expect relief of leg pain but numbness may persist depending on the length and degree of pressure that has been present.   Follow-Up Instructions: Return if symptoms worsen or fail to improve, for Return when blood sugars are under control and we may  be able to consider surgery then..   Orders:  Orders Placed This Encounter  Procedures  . HgB A1c  . Ambulatory referral to Physical Medicine Rehab   Meds ordered this encounter  Medications  . HYDROcodone-acetaminophen (NORCO/VICODIN) 5-325 MG tablet    Sig: Take 1 tablet by mouth every 6 (six) hours as needed for moderate pain.    Dispense:  30 tablet    Refill:  0      Procedures: No procedures performed   Clinical Data: Findings:  CLINICAL DATA:  Chronic low back pain radiating down both legs to the ankles. No prior surgery.  EXAM: LUMBAR MYELOGRAM  CT LUMBAR MYELOGRAM  FLUOROSCOPY TIME:  Radiation Exposure Index (as provided by the fluoroscopic device): 14.7 mGy  Fluoroscopy Time:  14 seconds  Number of Acquired Images:  15  PROCEDURE: After thorough discussion of risks and benefits of the procedure including bleeding, infection, injury to nerves, blood vessels, adjacent structures as well as headache and CSF leak, written and oral informed consent was obtained. Consent was obtained by Dr. Fabiola Backer. Time out form was completed.  Patient was positioned prone on the fluoroscopy table. Local anesthesia was provided with 1% lidocaine without epinephrine after prepped and draped in the usual sterile fashion. Puncture was performed at L2-L3 using a 3 1/2 inch 22-gauge spinal needle via right interlaminar approach. Using a single pass  through the dura, the needle was placed within the thecal sac, with return of clear CSF. 15 mL of Isovue M-200 was injected into the thecal sac, with normal opacification of the nerve roots and cauda equina consistent with free flow within the subarachnoid space.  I personally performed the lumbar puncture and administered the intrathecal contrast. I also personally supervised acquisition of the myelogram images.  TECHNIQUE: Contiguous axial images were obtained through the lumbar spine after the intrathecal  infusion of contrast. Coronal and sagittal reconstructions were obtained of the axial image sets.  COMPARISON:  MRI lumbar spine dated January 04, 2019.  FINDINGS: LUMBAR MYELOGRAM FINDINGS:  There are six lumbar type vertebral bodies. There are twelve rib-bearing thoracic vertebral bodies based on prior chest x-rays. Therefore, there is a fully lumbarized S1 vertebral body and the last well-formed disc space is designated S1-S2. Please note that this is different when compared to the prior MRI report.  6 mm anterolisthesis at L4-L5 increases to 9 mm with flexion and reduces to 4 mm with extension. Trace stepwise retrolisthesis from L1-L2 through L3-L4 with standing.  Large ventral extradural defect at L4-L5 increases with flexion. Severe spinal canal stenosis at L4-L5 with underfilling of the bilateral exiting L5 nerve roots. Small ventral extradural defects at L2-L3, L3-L4, and L5-S1.  CT LUMBAR MYELOGRAM FINDINGS:  Segmentation: Transitional lumbosacral anatomy. Six lumbar type vertebral bodies. Twelve rib-bearing thoracic vertebral bodies based on prior chest x-rays. Therefore, there is a fully lumbarized S1 vertebral body and the last well-formed disc space is designated S1-S2. Please note that this is different when compared to the prior MRI report.  Alignment: 6 mm anterolisthesis at L4-L5.  Vertebrae: No acute fracture or other focal pathologic process.  Conus medullaris and cauda equina: Conus extends to the L2 level. Clumping of the cauda equina nerve roots due to downstream stenosis.  Paraspinal and other soft tissues: Aortoiliac atherosclerosis. Otherwise negative.  Disc levels:  T12-L1:  Negative.  L1-L2:  Unchanged minimal diffuse disc bulging.  No stenosis.  L2-L3:  Unchanged mild diffuse disc bulging.  No stenosis.  L3-L4: Unchanged mild diffuse disc bulging. Unchanged mild right greater than left neuroforaminal stenosis. No spinal canal  stenosis.  L4-L5: Worsened mild diffuse disc bulging with severe bilateral facet arthropathy and ligamentum flavum hypertrophy. Progressive severe spinal canal stenosis and moderate bilateral neuroforaminal stenosis.  L5-S1: Unchanged broad-based posterior disc protrusion with superimposed small left subarticular disc extrusion migrating superiorly. Unchanged mild bilateral facet arthropathy. Unchanged mild bilateral lateral recess stenosis and borderline mild right neuroforaminal stenosis. No spinal canal stenosis.  S1-S2: Chronic right-sided pars defect. Unchanged broad-based posterior disc protrusion with superimposed small focal left paracentral disc protrusion. Unchanged mild bilateral facet arthropathy. No stenosis.  IMPRESSION: 1. Transitional lumbosacral anatomy with a fully lumbarized S1 vertebral body. The last well-formed disc space is designated S1-S2. Please note that this is different when compared to the prior MRI report. Correlation with radiographs is recommended prior to any operative intervention. 2. Dynamic instability and stenosis at L4-L5. Facet mediated grade 1 anterolisthesis with severe spinal canal and moderate bilateral neuroforaminal stenosis, worsened with flexion. 3. Mild spondylosis at the remaining lumbar levels is similar to prior study and without high-grade stenosis or impingement. 4.  Aortic atherosclerosis (ICD10-I70.0).   Electronically Signed   By: Titus Dubin M.D.   On: 05/10/2019 13:43     Subjective: Chief Complaint  Patient presents with  . Lower Back - Pain    64 year old male with  history of Insulin dependent diabetes and lumbar spinal stenosis. Reports that the leg pain is some better but he is still limited in his standing and walking tolerance. He has been to [facility] and they have changed him to Novolog and metformin and this seems to keep him at about 250-200 bs. He is taking his blood sugars twice  to three times per day. He spoke with  Dr. Ernestina Patches about a further ESI for his back pain. He has had Facet blocks bilateral L3-4 and L4-5 in 03/2019 with some improvement in his back pain. He has undergone previous evaluation for consideration of intervention surgically. He requests refill on his hydrocodone and wishes to have repeat facet blocks as done at his last visit with Dr. Ernestina Patches. We have not operated on this patient, his condition is stenosis With neurogenic claudication. He is unable to walk for exercise. He is awaiting surgery due to elevated sugars.    Review of Systems  Constitutional: Negative.   HENT: Negative.   Eyes: Negative.   Respiratory: Negative.   Cardiovascular: Negative.   Gastrointestinal: Negative.   Endocrine: Negative.   Genitourinary: Negative.   Musculoskeletal: Negative.   Skin: Negative.   Allergic/Immunologic: Negative.   Neurological: Negative.   Hematological: Negative.   Psychiatric/Behavioral: Negative.      Objective: Vital Signs: BP 128/86 (BP Location: Left Arm, Patient Position: Sitting)   Pulse 78   Ht 6' (1.829 m)   Wt 213 lb (96.6 kg)   BMI 28.89 kg/m   Physical Exam Constitutional:      Appearance: He is well-developed.  HENT:     Head: Normocephalic and atraumatic.  Eyes:     Pupils: Pupils are equal, round, and reactive to light.  Pulmonary:     Effort: Pulmonary effort is normal.     Breath sounds: Normal breath sounds.  Abdominal:     General: Bowel sounds are normal.     Palpations: Abdomen is soft.  Musculoskeletal:     Cervical back: Normal range of motion and neck supple.     Lumbar back: Negative right straight leg raise test and negative left straight leg raise test.  Skin:    General: Skin is warm and dry.  Neurological:     Mental Status: He is alert and oriented to person, place, and time.  Psychiatric:        Behavior: Behavior normal.        Thought Content: Thought content normal.        Judgment:  Judgment normal.     Back Exam   Tenderness  The patient is experiencing tenderness in the lumbar.  Range of Motion  Extension: abnormal  Flexion: abnormal  Lateral bend right: abnormal  Lateral bend left: abnormal  Rotation right: abnormal  Rotation left: abnormal   Muscle Strength  Right Quadriceps:  5/5  Left Quadriceps:  5/5  Right Hamstrings:  5/5  Left Hamstrings:  5/5   Tests  Straight leg raise right: negative Straight leg raise left: negative  Reflexes  Patellar: 0/4 Achilles: 0/4 Babinski's sign: normal   Other  Toe walk: abnormal Heel walk: abnormal Sensation: decreased Gait: abnormal  Erythema: no back redness Scars: absent  Comments:  SLR is negative,  Motor without focal deficit. Ambulation with cart in grocery store. Can walk to mail box and back.       Specialty Comments:  No specialty comments available.  Imaging: No results found.   PMFS History: Patient Active Problem List  Diagnosis Date Noted  . Type 2 diabetes mellitus with diabetic polyneuropathy, with long-term current use of insulin ([place]) 04/15/2019  . Hypertriglyceridemia 04/15/2019  . Spinal stenosis of lumbar region with neurogenic claudication 03/07/2019  . Lumbar radiculopathy 03/07/2019  . Chronic bilateral low back pain with bilateral sciatica 03/07/2019  . CAD (coronary artery disease), native coronary artery, minimal by cardiac cath 01/08/19 01/09/2019  . Syncope and collapse   . Spondylosis without myelopathy or radiculopathy, lumbar region 04/26/2018  . Cocaine abuse ([place]) 12/19/2016  . Shingles outbreak 12/13/2016  . Diabetes type 2, uncontrolled ([place]) 04/19/2016  . Metatarsalgia 10/19/2015  . Back muscle spasm 11/18/2013  . Hyperlipidemia 11/18/2013  . HTN (hypertension) 11/22/2012  . Chronic pain syndrome 11/22/2012  . Tobacco use disorder 11/22/2012  . Dental caries 11/22/2012   Past Medical History:  Diagnosis Date  . Arthritis    All over  . CAD  (coronary artery disease), native coronary artery, minimal by cardiac cath 01/08/19 01/09/2019  . Chronic pain syndrome   . Diabetes mellitus   . History of cocaine use   . History of kidney stones   . Hypercholesteremia   . Hypertension   . Low back pain   . Lumbar radiculopathy   . Right leg pain   . Seborrheic dermatitis of scalp   . Shingles     Family History  Problem Relation Age of Onset  . Cancer Mother   . Stroke Father   . Heart disease Father   . Diabetes Father     Past Surgical History:  Procedure Laterality Date  . CIRCUMCISION N/A 03/16/2018   Procedure: CIRCUMCISION ADULT WITH PENILE BLOCK;  Surgeon: Alexis Frock, MD;  Location: [facility];  Service: Urology;  Laterality: N/A;  16 MINS  . KNEE ARTHROSCOPY Bilateral   . LEFT HEART CATH AND CORONARY ANGIOGRAPHY N/A 01/08/2019   Procedure: LEFT HEART CATH AND CORONARY ANGIOGRAPHY;  Surgeon: Troy Sine, MD;  Location: [facility];  Service: Cardiovascular;  Laterality: N/A;   Social History   Occupational History  . Not on file  Tobacco Use  . Smoking status: Current Every Day Smoker    Packs/day: 1.00    Years: 50.00    Pack years: 50.00    Types: Cigarettes  . Smokeless tobacco: Former Systems developer    Types: Chew  Substance and Sexual Activity  . Alcohol use: Yes    Comment: occ  . Drug use: Yes    Types: Marijuana, Cocaine    Comment: twice a week  . Sexual activity: Not on file

## 2019-09-30 NOTE — Patient Instructions (Addendum)
Plan: Avoid bending, stooping and avoid lifting weights greater than 10 lbs. Avoid prolong standing and walking. Order for a new walker with wheels. Surgery scheduling secretary Kandice Hams, you should call to schedule for surgery when your blood sugars are stable., we will check a blood sugar today. Surgery recommended is a one level lumbar fusion L4-5 this would be done with rods, screws and cages with local bone graft and allograft (donor bone graft). Take hydrocodone for for pain. Risk of surgery includes risk of infection 1 in 100 patients, bleeding 1/2% chance you would need a transfusion.   Risk to the nerves is one in 10,000. You will need to use a brace for 3 months and wean from the brace on the 4th month. Expect improved walking and standing tolerance. Expect relief of leg pain but numbness may persist depending on the length and degree of pressure that has been present.

## 2019-10-01 LAB — HEMOGLOBIN A1C
Hgb A1c MFr Bld: 9.8 % of total Hgb — ABNORMAL HIGH (ref <5.7)
Mean Plasma Glucose: 235 (calc)
eAG (mmol/L): 13 (calc)

## 2019-10-09 ENCOUNTER — Other Ambulatory Visit: Payer: Self-pay | Admitting: Internal Medicine

## 2019-10-09 ENCOUNTER — Other Ambulatory Visit: Payer: Self-pay

## 2019-10-09 ENCOUNTER — Telehealth: Payer: Self-pay | Admitting: Internal Medicine

## 2019-10-09 DIAGNOSIS — E1129 Type 2 diabetes mellitus with other diabetic kidney complication: Secondary | ICD-10-CM

## 2019-10-09 MED ORDER — METFORMIN HCL 1000 MG PO TABS: 1000.0000 mg | ORAL_TABLET | Freq: Two times a day (BID) | ORAL | 0 refills | Status: DC

## 2019-10-09 NOTE — Telephone Encounter (Signed)
LM on VM for patient that RX was sent in to [facility] on [address].

## 2019-10-09 NOTE — Telephone Encounter (Signed)
MEDICATION: Metformin 1000 MG  PHARMACY:  [facility] at [address]  IS THIS A 90 DAY SUPPLY :   IS PATIENT OUT OF MEDICATION: no  IF NOT; HOW MUCH IS LEFT:   LAST APPOINTMENT DATE: @3 /09/2019  NEXT APPOINTMENT DATE:@04 /11/2019  DO WE HAVE YOUR PERMISSION TO LEAVE A DETAILED MESSAGE:  OTHER COMMENTS:    **Let patient know to contact pharmacy at the end of the day to make sure medication is ready. **  ** Please notify patient to allow 48-72 hours to process**  **Encourage patient to contact the pharmacy for refills or they can request refills through [facility]**

## 2019-10-10 ENCOUNTER — Ambulatory Visit: Payer: Medicare Other | Admitting: Specialist

## 2019-10-10 ENCOUNTER — Other Ambulatory Visit: Payer: Self-pay

## 2019-10-14 ENCOUNTER — Encounter: Payer: Self-pay | Admitting: Internal Medicine

## 2019-10-14 ENCOUNTER — Ambulatory Visit (INDEPENDENT_AMBULATORY_CARE_PROVIDER_SITE_OTHER): Payer: Medicare Other | Admitting: Internal Medicine

## 2019-10-14 ENCOUNTER — Other Ambulatory Visit: Payer: Self-pay

## 2019-10-14 VITALS — BP 128/82 | HR 83 | Temp 98.3°F | Ht 72.0 in | Wt 227.0 lb

## 2019-10-14 DIAGNOSIS — R809 Proteinuria, unspecified: Secondary | ICD-10-CM

## 2019-10-14 DIAGNOSIS — Z794 Long term (current) use of insulin: Secondary | ICD-10-CM

## 2019-10-14 DIAGNOSIS — E781 Pure hyperglyceridemia: Secondary | ICD-10-CM | POA: Diagnosis not present

## 2019-10-14 DIAGNOSIS — E1129 Type 2 diabetes mellitus with other diabetic kidney complication: Secondary | ICD-10-CM | POA: Diagnosis not present

## 2019-10-14 MED ORDER — NOVOLOG MIX 70/30 FLEXPEN (70-30) 100 UNIT/ML ~~LOC~~ SUPN: 28.0000 [IU] | PEN_INJECTOR | Freq: Two times a day (BID) | SUBCUTANEOUS | 6 refills | Status: DC

## 2019-10-14 MED ORDER — METFORMIN HCL 1000 MG PO TABS: 1000.0000 mg | ORAL_TABLET | Freq: Two times a day (BID) | ORAL | 3 refills | Status: DC

## 2019-10-14 NOTE — Patient Instructions (Signed)
-   Novolog Mix 28 units with Breakfast and 28 units with Supper - Metformin 1 tablet twice a day      - HOW TO TREAT LOW BLOOD SUGARS (Blood sugar LESS THAN 70 MG/DL)  Please follow the RULE OF 15 for the treatment of hypoglycemia treatment (when your (blood sugars are less than 70 mg/dL)    STEP 1: Take 15 grams of carbohydrates when your blood sugar is low, which includes:   3-4 GLUCOSE TABS  OR  3-4 OZ OF JUICE OR REGULAR SODA OR  ONE TUBE OF GLUCOSE GEL     STEP 2: RECHECK blood sugar in 15 MINUTES STEP 3: If your blood sugar is still low at the 15 minute recheck --> then, go back to STEP 1 and treat AGAIN with another 15 grams of carbohydrates.

## 2019-10-14 NOTE — Progress Notes (Signed)
Name: Ryan Farmer  Age/ Sex: 64 y.o., male   MRN/ DOB: [id number], Dec 24, 1955     PCP: Charlott Rakes, MD   Reason for Endocrinology Evaluation: Type 2 Diabetes Mellitus  Initial Endocrine Consultative Visit: 04/15/2019    PATIENT IDENTIFIER: [name] is a 64 y.o. male with a past medical history of CAD, T2DM, Hyperlipidemia and polysubstance abuse. The patient has followed with Endocrinology clinic since 04/15/2019 for consultative assistance with management of his diabetes.  DIABETIC HISTORY:  Ryan Farmer was diagnosed with DM in 2011. He has been on SU in the past, has been on metformin since diagnosis, insulin started in 2018. His hemoglobin A1c has ranged from 6.4% in 2017, peaking at 12.6% in 2020.  On his initial visit to our clinic he was on Lantus and Metformin with an A1c 10.4% . We stopped lantus and started a novolog Mix.   Receives intra-articular injections periodically.    Lives with sister, on disability ( can not read or write)    Drinks 12-18 packs a few times a month   SUBJECTIVE:   During the last visit (06/11/2019): A1c 10.4% . Continued metformin and Novolog Mix .   Today (10/14/2019): Ryan Farmer is here for a follow up on diabetes management.   He checks his blood sugars occasionally. The patient has not had hypoglycemic episodes since the last clinic visit. Otherwise, the patient has not required any recent emergency interventions for hypoglycemia and has not had recent hospitalizations secondary to hyper or hypoglycemic episodes.    ROS: As per HPI and as detailed below: Review of Systems  Constitutional: Negative for chills and fever.  HENT: Negative for congestion and sore throat.   Respiratory: Negative for cough and shortness of breath.   Cardiovascular: Negative for chest pain and palpitations.  Gastrointestinal: Positive for diarrhea. Negative for nausea.  Musculoskeletal: Positive for back pain.      HOME DIABETES REGIMEN:   Novolog Mix 30 units BID - has been taking 24 units  Metformin 1000 mg BID    METER DOWNLOAD SUMMARY: Date range evaluated: 3/23-10/14/2019 Fingerstick Blood Glucose Tests = 14 Average Number Tests/Day = 1.0 Overall Mean FS Glucose = 232   BG Ranges: Low = 148 High = 399   Hypoglycemic Events/30 Days: BG < 50 = 0 Episodes of symptomatic severe hypoglycemia = 0    HISTORY:  Past Medical History:  Past Medical History:  Diagnosis Date  . Arthritis    All over  . CAD (coronary artery disease), native coronary artery, minimal by cardiac cath 01/08/19 01/09/2019  . Chronic pain syndrome   . Diabetes mellitus   . History of cocaine use   . History of kidney stones   . Hypercholesteremia   . Hypertension   . Low back pain   . Lumbar radiculopathy   . Right leg pain   . Seborrheic dermatitis of scalp   . Shingles    Past Surgical History:  Past Surgical History:  Procedure Laterality Date  . CIRCUMCISION N/A 03/16/2018   Procedure: CIRCUMCISION ADULT WITH PENILE BLOCK;  Surgeon: Alexis Frock, MD;  Location: [facility];  Service: Urology;  Laterality: N/A;  23 MINS  . KNEE ARTHROSCOPY Bilateral   . LEFT HEART CATH AND CORONARY ANGIOGRAPHY N/A 01/08/2019   Procedure: LEFT HEART CATH AND CORONARY ANGIOGRAPHY;  Surgeon: Troy Sine, MD;  Location: [facility];  Service: Cardiovascular;  Laterality: N/A;    Social History:  reports that he has been smoking cigarettes. He has a 50.00 pack-year smoking history. He has quit using smokeless tobacco.  His smokeless tobacco use included chew. He reports current alcohol use. He reports current drug use. Drugs: Marijuana and Cocaine. Family History:  Family History  Problem Relation Age of Onset  . Cancer Mother   . Stroke Father   . Heart disease Father   . Diabetes Father      HOME MEDICATIONS: Allergies as of 10/14/2019   No Known Allergies     Medication List       Accurate as of October 14, 2019   7:58 AM. If you have any questions, ask your nurse or doctor.        Accu-Chek Aviva Plus w/Device Kit 1 Device by Does not apply route 3 (three) times daily after meals.   Accu-Chek Guide test strip Generic drug: glucose blood TEST TWICE DAILY AS DIRECTED   accu-chek soft touch lancets Use as instructed to test blood sugar 3 times daily DX E11.42   aspirin 81 MG chewable tablet Chew 1 tablet (81 mg total) by mouth daily.   atorvastatin 40 MG tablet Commonly known as: LIPITOR Take 40 mg by mouth daily.   atorvastatin 40 MG tablet Commonly known as: LIPITOR Take 1 tablet (40 mg total) by mouth daily.   baclofen 10 MG tablet Commonly known as: LIORESAL Take 1 tablet (10 mg total) by mouth 3 (three) times daily.   benzonatate 100 MG capsule Commonly known as: Tessalon Perles Take 1 capsule (100 mg total) by mouth 3 (three) times daily as needed for cough.   diclofenac 50 MG EC tablet Commonly known as: VOLTAREN TAKE 1 TABLET(50 MG) BY MOUTH TWICE DAILY   gabapentin 300 MG capsule Commonly known as: NEURONTIN Take 1 capsule (300 mg total) by mouth 3 (three) times daily.   HYDROcodone-acetaminophen 5-325 MG tablet Commonly known as: NORCO/VICODIN Take 1 tablet by mouth every 6 (six) hours as needed for moderate pain.   Insulin Pen Needle 32G X 4 MM Misc 1 Device by Does not apply route 2 (two) times daily with a meal.   lisinopril 2.5 MG tablet Commonly known as: ZESTRIL Take 1 tablet (2.5 mg total) by mouth daily. Kidney protection   loratadine 10 MG tablet Commonly known as: Claritin Take 1 tablet (10 mg total) by mouth daily.   metFORMIN 1000 MG tablet Commonly known as: GLUCOPHAGE Take 1 tablet (1,000 mg total) by mouth 2 (two) times daily with a meal.   NovoLOG Mix 70/30 FlexPen (70-30) 100 UNIT/ML FlexPen Generic drug: insulin aspart protamine - aspart Inject 0.28 mLs (28 Units total) into the skin 2 (two) times daily. What changed: how much to take  Changed by: Dorita Sciara, MD   omega-3 acid ethyl esters 1 g capsule Commonly known as: Lovaza Take 1 capsule (1 g total) by mouth 2 (two) times daily.        OBJECTIVE:   Vital Signs: BP 128/82 (BP Location: Left Arm, Patient Position: Sitting, Cuff Size: Large)   Pulse 83   Temp 98.3 F (36.8 C)   Ht 6' (1.829 m)   Wt 227 lb (103 kg)   SpO2 98%   BMI 30.79 kg/m   Wt Readings from Last 3 Encounters:  10/14/19 227 lb (103 kg)  09/30/19 213 lb (96.6 kg)  07/23/19 228 lb 12.8 oz (103.8 kg)     Exam: General: Pt appears well and is in NAD  Lungs:  Clear with good BS bilat with no rales, rhonchi, or wheezes  Heart: RRR with normal S1 and S2 and no gallops; no murmurs; no rub  Neuro: MS is good with appropriate affect, pt is alert and Ox3      DM foot exam: 04/15/2019  The skin of the feet is intact without sores or ulcerations. The pedal pulses are 2+ on right and 2+ on left. The sensation is intact to a screening 5.07, 10 gram monofilament bilaterally   DATA REVIEWED:  Lab Results  Component Value Date   HGBA1C 9.8 (H) 09/30/2019   HGBA1C 10.4 (A) 04/15/2019   HGBA1C 12.5 (H) 01/08/2019   Lab Results  Component Value Date   MICROALBUR 150 12/13/2016   LDLCALC 56 07/23/2019   CREATININE 0.93 04/15/2019   Lab Results  Component Value Date   MICRALBCREAT 145 (H) 08/07/2018     Lab Results  Component Value Date   CHOL 125 07/23/2019   HDL 29 (L) 07/23/2019   LDLCALC 56 07/23/2019   LDLDIRECT 118.6 (H) 01/08/2019   TRIG 248 (H) 07/23/2019   CHOLHDL 4.3 07/23/2019         ASSESSMENT / PLAN / RECOMMENDATIONS:   1) Type 2  Diabetes Mellitus, Poorly Controlled, With Neuropathy and CAD  complications - Most recent A1c of 9.8 %. Goal A1c < 7.5 %.    - Slow but gradual improvement in glycemic control.  - I again emphasized the importance of taking insulin with the first and last meal of the day.  - Somehow he continues to get confused about his  dosing of insulin. On the last visit he told me he was taking 30 units of insulin which I have continued , but today he is down to 24 units.  - Will adjust the dose as below  - Will consider add-on therapy with GLp-1 agonists, his TG  Are improving and no hx of pancreatitis.    Plan: MEDICATIONS:  Novolog Mix 28 units BID with meals   Metformin 1000 mg BID   EDUCATION / INSTRUCTIONS:  BG monitoring instructions: Patient is instructed to check his blood sugars 2 times a day, fasting and supper .  Call [facility] if: BG persistently < 70 or > 300. . I reviewed the Rule of 15 for the treatment of hypoglycemia in detail with the patient. Literature supplied.   2) Hypertriglyceridemia: Patient is on a statin. His TG were  very elevated but this has been slowly improving.     F/U in 3 months    Signed electronically by: Mack Guise, MD  [facility]  [address] [place], [place] [address] Phone: [phone] FAX: [phone]   CC: Charlott Rakes, [place] [address] Phone: [phone]  Fax: [phone]  Return to Endocrinology clinic as below: Future Appointments  Date Time Provider [place]  10/21/2019 10:00 AM Magnus Sinning, MD [facility] None  02/13/2020  7:30 AM Zaylin Pistilli, Melanie Crazier, MD [facility] None

## 2019-10-21 ENCOUNTER — Other Ambulatory Visit: Payer: Self-pay

## 2019-10-21 ENCOUNTER — Ambulatory Visit (INDEPENDENT_AMBULATORY_CARE_PROVIDER_SITE_OTHER): Payer: Medicare Other | Admitting: Physical Medicine and Rehabilitation

## 2019-10-21 ENCOUNTER — Ambulatory Visit: Payer: Self-pay

## 2019-10-21 ENCOUNTER — Encounter: Payer: Self-pay | Admitting: Physical Medicine and Rehabilitation

## 2019-10-21 VITALS — BP 121/77 | HR 73

## 2019-10-21 DIAGNOSIS — M48062 Spinal stenosis, lumbar region with neurogenic claudication: Secondary | ICD-10-CM | POA: Diagnosis not present

## 2019-10-21 DIAGNOSIS — M5416 Radiculopathy, lumbar region: Secondary | ICD-10-CM | POA: Diagnosis not present

## 2019-10-21 MED ORDER — METHYLPREDNISOLONE ACETATE 80 MG/ML IJ SUSP
40.0000 mg | Freq: Once | INTRAMUSCULAR | Status: AC
  Administered 2019-10-21: 40 mg

## 2019-10-21 NOTE — Progress Notes (Signed)
Numeric Pain Rating Scale and Functional Assessment Average Pain 10   In the last MONTH (on 0-10 scale) has pain interfered with the following?  1. General activity like being  able to carry out your everyday physical activities such as walking, climbing stairs, carrying groceries, or moving a chair?  Rating(10)   +Driver, -BT, -Dye Allergies. 

## 2019-10-21 NOTE — Progress Notes (Signed)
Ryan Farmer - 64 y.o. male MRN [id number]  Date of birth: 29-Nov-1955  Office Visit Note: Visit Date: 10/21/2019 PCP: Charlott Rakes, MD Referred by: Charlott Rakes, MD  Subjective: Chief Complaint  Patient presents with  . Lower Back - Pain  . Right Leg - Pain   HPI:  Ryan Farmer is a 64 y.o. male who comes in today At the request of Dr. Basil Dess for repeat lumbar epidural injection.  Last injection performed was a L3 transforaminal injection bilaterally that gave him some relief.  He has been taking hydrocodone with some relief.  He has had a myelogram since have seen him showing a dynamic listhesis of L4 on L5 if you are counting the lowest lumbar level as S1.  The S1 segment is fully lumbarized.  The MRI and the myelogram number these differently.  Dr. Louanne Skye did suggest surgery but the patient's blood sugars have been out of control.  His last hemoglobin A1c is lower now at 9.8 which is actually better than it has been.  He is on insulin.  We talked about injections and increased blood sugars today.  We did complete bilateral L4 transforaminal injection if you consider the lowest level S1.  This would be based on the CT myelogram.  ROS Otherwise per HPI.  Assessment & Plan: Visit Diagnoses:  1. Lumbar radiculopathy   2. Spinal stenosis of lumbar region with neurogenic claudication     Plan: No additional findings.   Meds & Orders:  Meds ordered this encounter  Medications  . methylPREDNISolone acetate (DEPO-MEDROL) injection 40 mg    Orders Placed This Encounter  Procedures  . XR C-ARM NO REPORT  . Epidural Steroid injection    Follow-up: Return for Basil Dess, M.D..   Procedures: No procedures performed  Lumbosacral Transforaminal Epidural Steroid Injection - Sub-Pedicular Approach with Fluoroscopic Guidance  Patient: Ryan Farmer      Date of Birth: 1955-10-12 MRN: [id number] PCP: Charlott Rakes, MD      Visit Date: 10/21/2019   Universal Protocol:     Date/Time: 10/21/2019  Consent Given By: the patient  Position: PRONE  Additional Comments: Vital signs were monitored before and after the procedure. Patient was prepped and draped in the usual sterile fashion. The correct patient, procedure, and site was verified.   Injection Procedure Details:  Procedure Site One Meds Administered:  Meds ordered this encounter  Medications  . methylPREDNISolone acetate (DEPO-MEDROL) injection 40 mg    Laterality: Bilateral  Location/Site: This location is the same as the prior injection although the prior injection was labeled L3.  According to the CT myelogram the patient has a fully lumbarized transitional S1 segment.  Based on that numbering scheme today the injection was performed at the L4 position. L4-L5  Needle size: 22 G  Needle type: Spinal  Needle Placement: Transforaminal  Findings:    -Comments: Excellent flow of contrast along the nerve and into the epidural space.  Procedure Details: After squaring off the end-plates to get a true AP view, the C-arm was positioned so that an oblique view of the foramen as noted above was visualized. The target area is just inferior to the "nose of the scotty dog" or sub pedicular. The soft tissues overlying this structure were infiltrated with 2-3 ml. of 1% Lidocaine without Epinephrine.  The spinal needle was inserted toward the target using a "trajectory" view along the fluoroscope beam.  Under AP and lateral visualization, the needle was advanced so  it did not puncture dura and was located close the 6 O'Clock position of the pedical in AP tracterory. Biplanar projections were used to confirm position. Aspiration was confirmed to be negative for CSF and/or blood. A 1-2 ml. volume of Isovue-250 was injected and flow of contrast was noted at each level. Radiographs were obtained for documentation purposes.   After attaining the desired flow of contrast documented above, a 0.5 to 1.0 ml test  dose of 0.25% Marcaine was injected into each respective transforaminal space.  The patient was observed for 90 seconds post injection.  After no sensory deficits were reported, and normal lower extremity motor function was noted,   the above injectate was administered so that equal amounts of the injectate were placed at each foramen (level) into the transforaminal epidural space.   Additional Comments:  The patient tolerated the procedure well Dressing: 2 x 2 sterile gauze and Band-Aid    Post-procedure details: Patient was observed during the procedure. Post-procedure instructions were reviewed.  Patient left the clinic in stable condition.      Clinical History: MRI LUMBAR SPINE WITHOUT CONTRAST  TECHNIQUE: Multiplanar, multisequence MR imaging of the lumbar spine was performed. No intravenous contrast was administered.  COMPARISON:  Lumbar spine radiographs 12/05/2017.  FINDINGS: Segmentation: Conventional anatomy assumed, with the last open disc space designated L5-S1.  Alignment:  Normal.  Vertebrae: No worrisome osseous lesion or acute fracture. Probable unilateral pars defect on the right without marrow edema. Facet mediated edema on the right at L3-4. there are partially bridging osteophytes along the anterior aspect of the right sacroiliac joint.  Conus medullaris: Extends to the upper L1 level and appears normal.  Paraspinal and other soft tissues: No significant paraspinal findings.  Disc levels:  T12-L1: Mild disc degeneration with anterior osteophytes. No spinal stenosis or nerve root encroachment.  L1-2: Mild disc bulging and osteophyte formation. No significant spinal stenosis or nerve root encroachment.  L2-3: Annular disc bulging and mild facet hypertrophy. Mild narrowing of the lateral recesses and foramina bilaterally without definite nerve root encroachment.  L3-4: Moderate annular disc bulging. There is advanced facet  hypertrophy with bilateral facet joint effusions, subchondral cyst formation on the right and ligamentous thickening. These factors contribute to moderate spinal stenosis with narrowing of the lateral recesses and foramina, right greater than left.  L4-5: Loss of disc height with a broad-based central disc protrusion and small left-sided disc extrusion in the subarticular zone. Mild facet and ligamentous hypertrophy. Mild narrowing of the lateral recesses, worse on the left. The foramina remain sufficiently patent.  L5-S1: Broad-based left paracentral disc protrusion is largely contained within the ventral epidural fat, although touches the S1 nerve roots. There is no nerve root displacement or compression. Mild bilateral facet hypertrophy. The foramina are patent.  IMPRESSION: 1. Moderate multifactorial spinal stenosis at L3-4 with moderate narrowing of the lateral recesses and foramina, right greater than left. Advanced facet hypertrophy at this level may contribute to back pain. 2. Asymmetric narrowing of the left lateral recess at L4-5 secondary to a small disc extrusion in the subarticular zone. This could contribute to left L5 nerve root encroachment. 3. Lesser spondylosis at the other levels as detailed above. No other significant spinal stenosis or nerve root encroachment.   Electronically Signed   By: Richardean Sale M.D.   On: 01/04/2019 10:41     Objective:  VS:  HT:    WT:   BMI:     BP:121/77  HR:73bpm  TEMP: ( )  RESP:  Physical Exam  Ortho Exam Imaging: XR C-ARM NO REPORT  Result Date: 10/21/2019 Please see Notes tab for imaging impression.

## 2019-10-22 NOTE — Procedures (Signed)
Lumbosacral Transforaminal Epidural Steroid Injection - Sub-Pedicular Approach with Fluoroscopic Guidance  Patient: Ryan Farmer      Date of Birth: [birth year] MRN: [id number] PCP: Charlott Rakes, MD      Visit Date: 10/21/2019   Universal Protocol:    Date/Time: 10/21/2019  Consent Given By: the patient  Position: PRONE  Additional Comments: Vital signs were monitored before and after the procedure. Patient was prepped and draped in the usual sterile fashion. The correct patient, procedure, and site was verified.   Injection Procedure Details:  Procedure Site One Meds Administered:  Meds ordered this encounter  Medications  . methylPREDNISolone acetate (DEPO-MEDROL) injection 40 mg    Laterality: Bilateral  Location/Site: This location is the same as the prior injection although the prior injection was labeled L3.  According to the CT myelogram the patient has a fully lumbarized transitional S1 segment.  Based on that numbering scheme today the injection was performed at the L4 position. L4-L5  Needle size: 22 G  Needle type: Spinal  Needle Placement: Transforaminal  Findings:    -Comments: Excellent flow of contrast along the nerve and into the epidural space.  Procedure Details: After squaring off the end-plates to get a true AP view, the C-arm was positioned so that an oblique view of the foramen as noted above was visualized. The target area is just inferior to the "nose of the scotty dog" or sub pedicular. The soft tissues overlying this structure were infiltrated with 2-3 ml. of 1% Lidocaine without Epinephrine.  The spinal needle was inserted toward the target using a "trajectory" view along the fluoroscope beam.  Under AP and lateral visualization, the needle was advanced so it did not puncture dura and was located close the 6 O'Clock position of the pedical in AP tracterory. Biplanar projections were used to confirm position. Aspiration was confirmed to be  negative for CSF and/or blood. A 1-2 ml. volume of Isovue-250 was injected and flow of contrast was noted at each level. Radiographs were obtained for documentation purposes.   After attaining the desired flow of contrast documented above, a 0.5 to 1.0 ml test dose of 0.25% Marcaine was injected into each respective transforaminal space.  The patient was observed for 90 seconds post injection.  After no sensory deficits were reported, and normal lower extremity motor function was noted,   the above injectate was administered so that equal amounts of the injectate were placed at each foramen (level) into the transforaminal epidural space.   Additional Comments:  The patient tolerated the procedure well Dressing: 2 x 2 sterile gauze and Band-Aid    Post-procedure details: Patient was observed during the procedure. Post-procedure instructions were reviewed.  Patient left the clinic in stable condition.

## 2019-11-09 ENCOUNTER — Other Ambulatory Visit: Payer: Self-pay | Admitting: Adult Health

## 2019-11-09 ENCOUNTER — Other Ambulatory Visit: Payer: Self-pay | Admitting: Physician Assistant

## 2019-11-09 ENCOUNTER — Other Ambulatory Visit: Payer: Self-pay | Admitting: Internal Medicine

## 2019-11-09 ENCOUNTER — Other Ambulatory Visit: Payer: Self-pay | Admitting: Specialist

## 2019-11-09 DIAGNOSIS — E7800 Pure hypercholesterolemia, unspecified: Secondary | ICD-10-CM

## 2019-11-09 DIAGNOSIS — R809 Proteinuria, unspecified: Secondary | ICD-10-CM

## 2019-11-09 DIAGNOSIS — E1129 Type 2 diabetes mellitus with other diabetic kidney complication: Secondary | ICD-10-CM

## 2019-11-12 ENCOUNTER — Other Ambulatory Visit: Payer: Self-pay

## 2019-11-12 DIAGNOSIS — E7800 Pure hypercholesterolemia, unspecified: Secondary | ICD-10-CM

## 2019-11-12 MED ORDER — ATORVASTATIN CALCIUM 40 MG PO TABS: 40.0000 mg | ORAL_TABLET | Freq: Every day | ORAL | 3 refills | Status: DC

## 2019-12-05 ENCOUNTER — Other Ambulatory Visit: Payer: Self-pay | Admitting: Specialist

## 2019-12-05 NOTE — Telephone Encounter (Signed)
Patient called.   Needs a refill on his hydrocodone.   Call back: [phone]

## 2019-12-05 NOTE — Addendum Note (Signed)
Addended by: Minda Ditto, Geoffery Spruce on: 12/05/2019 03:49 PM   Modules accepted: Orders

## 2019-12-05 NOTE — Telephone Encounter (Signed)
Request has been sent to Dr. Nitka 

## 2019-12-09 MED ORDER — HYDROCODONE-ACETAMINOPHEN 5-325 MG PO TABS: 1.0000 | ORAL_TABLET | Freq: Four times a day (QID) | ORAL | 0 refills | Status: DC | PRN

## 2019-12-10 ENCOUNTER — Other Ambulatory Visit: Payer: Self-pay | Admitting: Radiology

## 2019-12-11 ENCOUNTER — Other Ambulatory Visit: Payer: Self-pay

## 2019-12-11 MED ORDER — DICLOFENAC SODIUM 50 MG PO TBEC: DELAYED_RELEASE_TABLET | ORAL | 1 refills | Status: DC

## 2019-12-11 MED ORDER — LORATADINE 10 MG PO TABS: 10.0000 mg | ORAL_TABLET | Freq: Every day | ORAL | 1 refills | Status: DC

## 2020-01-10 ENCOUNTER — Telehealth: Payer: Self-pay | Admitting: Physical Medicine and Rehabilitation

## 2020-01-10 ENCOUNTER — Other Ambulatory Visit: Payer: Self-pay | Admitting: Specialist

## 2020-01-10 ENCOUNTER — Telehealth: Payer: Self-pay | Admitting: Specialist

## 2020-01-10 MED ORDER — HYDROCODONE-ACETAMINOPHEN 5-325 MG PO TABS: 1.0000 | ORAL_TABLET | Freq: Four times a day (QID) | ORAL | 0 refills | Status: DC | PRN

## 2020-01-10 NOTE — Telephone Encounter (Signed)
I left voicemail for patient advising. 

## 2020-01-10 NOTE — Telephone Encounter (Signed)
Please advise 

## 2020-01-10 NOTE — Telephone Encounter (Signed)
Could you advise or hold for Dr. Louanne Skye? Hydrocodone 5/325 1 po  q 6 prn #30 with no refills sent in on 12/09/2019.

## 2020-01-10 NOTE — Telephone Encounter (Signed)
Pt states he would like a refill of his hydrocodone medication sent in.

## 2020-01-10 NOTE — Telephone Encounter (Signed)
Rx sent 

## 2020-01-10 NOTE — Telephone Encounter (Signed)
Pt states he would like to set up an appt for his back; if possible this month.   [phone]

## 2020-01-14 ENCOUNTER — Other Ambulatory Visit: Payer: Self-pay | Admitting: Radiology

## 2020-01-17 NOTE — Telephone Encounter (Signed)
Patient last injection 10/21/19 bilateral L4 transforaminal  Please advise.

## 2020-01-20 NOTE — Telephone Encounter (Signed)
Scheduled for 7/14 at 1530 with driver.

## 2020-01-20 NOTE — Telephone Encounter (Signed)
ok 

## 2020-01-22 ENCOUNTER — Other Ambulatory Visit: Payer: Self-pay

## 2020-01-22 ENCOUNTER — Ambulatory Visit (INDEPENDENT_AMBULATORY_CARE_PROVIDER_SITE_OTHER): Payer: Medicare Other | Admitting: Physical Medicine and Rehabilitation

## 2020-01-22 ENCOUNTER — Encounter: Payer: Self-pay | Admitting: Physical Medicine and Rehabilitation

## 2020-01-22 ENCOUNTER — Ambulatory Visit: Payer: Self-pay

## 2020-01-22 VITALS — BP 141/85 | HR 84

## 2020-01-22 DIAGNOSIS — M48062 Spinal stenosis, lumbar region with neurogenic claudication: Secondary | ICD-10-CM

## 2020-01-22 DIAGNOSIS — M5416 Radiculopathy, lumbar region: Secondary | ICD-10-CM

## 2020-01-22 MED ORDER — METHYLPREDNISOLONE ACETATE 80 MG/ML IJ SUSP
40.0000 mg | Freq: Once | INTRAMUSCULAR | Status: AC
  Administered 2020-01-22: 40 mg

## 2020-01-22 NOTE — Progress Notes (Signed)
Pt states lower back and his leg is giving him pain. Pt states walking and standing makes his pain worse. Pt state laying down makes the pain worse. Pt states nothing makes the pain.     Numeric Pain Rating Scale and Functional Assessment Average Pain 10   In the last MONTH (on 0-10 scale) has pain interfered with the following?  1. General activity like being  able to carry out your everyday physical activities such as walking, climbing stairs, carrying groceries, or moving a chair?  Rating(10)   +Driver, -BT, -Dye Allergies.

## 2020-02-10 ENCOUNTER — Other Ambulatory Visit: Payer: Self-pay | Admitting: Specialist

## 2020-02-10 NOTE — Telephone Encounter (Signed)
Sent request to Dr. Nitka 

## 2020-02-10 NOTE — Telephone Encounter (Signed)
Pt would like a refill of his norco/vicodin called in please.

## 2020-02-11 MED ORDER — HYDROCODONE-ACETAMINOPHEN 5-325 MG PO TABS: 1.0000 | ORAL_TABLET | Freq: Four times a day (QID) | ORAL | 0 refills | Status: DC | PRN

## 2020-02-13 ENCOUNTER — Encounter: Payer: Self-pay | Admitting: Internal Medicine

## 2020-02-13 ENCOUNTER — Ambulatory Visit (INDEPENDENT_AMBULATORY_CARE_PROVIDER_SITE_OTHER): Payer: Medicare Other | Admitting: Internal Medicine

## 2020-02-13 ENCOUNTER — Other Ambulatory Visit: Payer: Self-pay

## 2020-02-13 VITALS — BP 118/72 | HR 77 | Ht 72.0 in | Wt 214.0 lb

## 2020-02-13 DIAGNOSIS — E1142 Type 2 diabetes mellitus with diabetic polyneuropathy: Secondary | ICD-10-CM

## 2020-02-13 DIAGNOSIS — E1159 Type 2 diabetes mellitus with other circulatory complications: Secondary | ICD-10-CM

## 2020-02-13 DIAGNOSIS — E1129 Type 2 diabetes mellitus with other diabetic kidney complication: Secondary | ICD-10-CM

## 2020-02-13 DIAGNOSIS — Z794 Long term (current) use of insulin: Secondary | ICD-10-CM

## 2020-02-13 DIAGNOSIS — R809 Proteinuria, unspecified: Secondary | ICD-10-CM

## 2020-02-13 LAB — POCT GLYCOSYLATED HEMOGLOBIN (HGB A1C): Hemoglobin A1C: 7.4 % — AB (ref 4.0–5.6)

## 2020-02-13 LAB — MICROALBUMIN / CREATININE URINE RATIO
Creatinine,U: 148.7 mg/dL
Microalb Creat Ratio: 18.6 mg/g (ref 0.0–30.0)
Microalb, Ur: 27.6 mg/dL — ABNORMAL HIGH (ref 0.0–1.9)

## 2020-02-13 NOTE — Patient Instructions (Signed)
-   You have done an AMAZING work. Keep Up the Good Work !  - Increase Novolog Mix to 30 units with Breakfast and 30 units with Supper -Continue Metformin 1000 mg twice daily       HOW TO TREAT LOW BLOOD SUGARS (Blood sugar LESS THAN 70 MG/DL)  Please follow the RULE OF 15 for the treatment of hypoglycemia treatment (when your (blood sugars are less than 70 mg/dL)    STEP 1: Take 15 grams of carbohydrates when your blood sugar is low, which includes:   3-4 GLUCOSE TABS  OR  3-4 OZ OF JUICE OR REGULAR SODA OR  ONE TUBE OF GLUCOSE GEL     STEP 2: RECHECK blood sugar in 15 MINUTES STEP 3: If your blood sugar is still low at the 15 minute recheck --> then, go back to STEP 1 and treat AGAIN with another 15 grams of carbohydrates.

## 2020-02-13 NOTE — Progress Notes (Signed)
Name: Ryan Farmer  Age/ Sex: 64 y.o., male   MRN/ DOB: [id number], 03-20-1956     PCP: Charlott Rakes, MD   Reason for Endocrinology Evaluation: Type 2 Diabetes Mellitus  Initial Endocrine Consultative Visit: 04/15/2019    PATIENT IDENTIFIER: Ryan Farmer is a 64 y.o. male with a past medical history of CAD, T2DM, Hyperlipidemia and polysubstance abuse. The patient has followed with Endocrinology clinic since 04/15/2019 for consultative assistance with management of his diabetes.  DIABETIC HISTORY:  Ryan Farmer was diagnosed with DM in 2011. He has been on SU in the past, has been on metformin since diagnosis, insulin started in 2018. His hemoglobin A1c has ranged from 6.4% in 2017, peaking at 12.6% in 2020.  On his initial visit to our clinic he was on Lantus and Metformin with an A1c 10.4% . We stopped lantus and started a novolog Mix.   Receives intra-articular injections periodically.    Lives with sister, on disability ( can not read or write)    Drinks 12-18 packs a few times a month   SUBJECTIVE:   During the last visit (10/14/2019): A1c 9.8 % . Continued metformin and Novolog Mix .   Today (02/13/2020): Ryan Farmer is here for a follow up on diabetes management.   He checks his blood sugars twice daily . The patient has not had hypoglycemic episodes since the last clinic visit. Otherwise, the patient has not required any recent emergency interventions for hypoglycemia and has not had recent hospitalizations secondary to hyper or hypoglycemic episodes.   He has made drastic lifestyle changes, avoiding fried foods and fast foods, has been eating healthier at home  Eager to have his back sx   ROS: As per HPI and as detailed below: Review of Systems  Gastrointestinal: Negative for diarrhea and nausea.  Musculoskeletal: Positive for back pain.      HOME DIABETES REGIMEN:  Novolog Mix 28 units BID Metformin 1000 mg BID    METER DOWNLOAD SUMMARY: Date range  evaluated: 7/23-02/13/2020 Fingerstick Blood Glucose Tests = 12 Average Number Tests/Day = 0.9 Overall Mean FS Glucose = 166   BG Ranges: Low = 124 High = 190   Hypoglycemic Events/30 Days: BG < 50 = 0 Episodes of symptomatic severe hypoglycemia = 0    HISTORY:  Past Medical History:  Past Medical History:  Diagnosis Date  . Arthritis    All over  . CAD (coronary artery disease), native coronary artery, minimal by cardiac cath 01/08/19 01/09/2019  . Chronic pain syndrome   . Diabetes mellitus   . History of cocaine use   . History of kidney stones   . Hypercholesteremia   . Hypertension   . Low back pain   . Lumbar radiculopathy   . Right leg pain   . Seborrheic dermatitis of scalp   . Shingles    Past Surgical History:  Past Surgical History:  Procedure Laterality Date  . CIRCUMCISION N/A 03/16/2018   Procedure: CIRCUMCISION ADULT WITH PENILE BLOCK;  Surgeon: Alexis Frock, MD;  Location: [facility];  Service: Urology;  Laterality: N/A;  67 MINS  . KNEE ARTHROSCOPY Bilateral   . LEFT HEART CATH AND CORONARY ANGIOGRAPHY N/A 01/08/2019   Procedure: LEFT HEART CATH AND CORONARY ANGIOGRAPHY;  Surgeon: Troy Sine, MD;  Location: [facility];  Service: Cardiovascular;  Laterality: N/A;    Social History:  reports that he has been smoking cigarettes. He has a 50.00 pack-year smoking  history. He has quit using smokeless tobacco.  His smokeless tobacco use included chew. He reports current alcohol use. He reports current drug use. Drugs: Marijuana and Cocaine. Family History:  Family History  Problem Relation Age of Onset  . Cancer Mother   . Stroke Father   . Heart disease Father   . Diabetes Father      HOME MEDICATIONS: Allergies as of 02/13/2020   No Known Allergies     Medication List       Accurate as of February 13, 2020  7:50 AM. If you have any questions, ask your nurse or doctor.        Accu-Chek Aviva Plus w/Device Kit 1 Device  by Does not apply route 3 (three) times daily after meals.   Accu-Chek Guide test strip Generic drug: glucose blood TEST TWICE DAILY AS DIRECTED   accu-chek soft touch lancets Use as instructed to test blood sugar 3 times daily DX E11.42   aspirin 81 MG chewable tablet Chew 1 tablet (81 mg total) by mouth daily.   atorvastatin 40 MG tablet Commonly known as: LIPITOR Take 40 mg by mouth daily.   atorvastatin 40 MG tablet Commonly known as: LIPITOR Take 1 tablet (40 mg total) by mouth daily.   baclofen 10 MG tablet Commonly known as: LIORESAL TAKE 1 TABLET(10 MG) BY MOUTH TWICE DAILY   benzonatate 100 MG capsule Commonly known as: Tessalon Perles Take 1 capsule (100 mg total) by mouth 3 (three) times daily as needed for cough.   diclofenac 50 MG EC tablet Commonly known as: VOLTAREN TAKE 1 TABLET(50 MG) BY MOUTH TWICE DAILY   gabapentin 300 MG capsule Commonly known as: NEURONTIN TAKE 1 CAPSULE(300 MG) BY MOUTH THREE TIMES DAILY   HYDROcodone-acetaminophen 5-325 MG tablet Commonly known as: NORCO/VICODIN Take 1 tablet by mouth every 6 (six) hours as needed for moderate pain.   Insulin Pen Needle 32G X 4 MM Misc 1 Device by Does not apply route 2 (two) times daily with a meal.   lisinopril 2.5 MG tablet Commonly known as: ZESTRIL Take 1 tablet (2.5 mg total) by mouth daily. Kidney protection   loratadine 10 MG tablet Commonly known as: Claritin Take 1 tablet (10 mg total) by mouth daily.   metFORMIN 1000 MG tablet Commonly known as: GLUCOPHAGE TAKE 1 TABLET(1000 MG) BY MOUTH TWICE DAILY WITH A MEAL   NovoLOG Mix 70/30 FlexPen (70-30) 100 UNIT/ML FlexPen Generic drug: insulin aspart protamine - aspart Inject 0.28 mLs (28 Units total) into the skin 2 (two) times daily.   omega-3 acid ethyl esters 1 g capsule Commonly known as: Lovaza Take 1 capsule (1 g total) by mouth 2 (two) times daily.        OBJECTIVE:   Vital Signs: BP 118/72 (BP Location: Left  Arm, Patient Position: Sitting, Cuff Size: Normal)   Pulse 77   Ht 6' (1.829 m)   Wt 214 lb (97.1 kg)   SpO2 96%   BMI 29.02 kg/m   Wt Readings from Last 3 Encounters:  02/13/20 214 lb (97.1 kg)  10/14/19 227 lb (103 kg)  09/30/19 213 lb (96.6 kg)     Exam: General: Pt appears well and is in NAD  Lungs: Clear with good BS bilat with no rales, rhonchi, or wheezes  Heart: RRR with normal S1 and S2 and no gallops; no murmurs; no rub  Neuro: MS is good with appropriate affect, pt is alert and Ox3      DM  foot exam: 02/13/2020  The skin of the feet is intact without sores or ulcerations. The pedal pulses are 2+ on right and 2+ on left. The sensation is decreased at the right heel to a screening 5.07, 10 gram monofilament    DATA REVIEWED:  Lab Results  Component Value Date   HGBA1C 7.4 (A) 02/13/2020   HGBA1C 9.8 (H) 09/30/2019   HGBA1C 10.4 (A) 04/15/2019   Lab Results  Component Value Date   MICROALBUR 150 12/13/2016   LDLCALC 56 07/23/2019   CREATININE 0.93 04/15/2019      Lab Results  Component Value Date   CHOL 125 07/23/2019   HDL 29 (L) 07/23/2019   LDLCALC 56 07/23/2019   LDLDIRECT 118.6 (H) 01/08/2019   TRIG 248 (H) 07/23/2019   CHOLHDL 4.3 07/23/2019       Results for MALAKHAI, BEITLER (MRN [id number]) as of 02/13/2020 13:27  Ref. Range 10/16/2017 11:10 08/07/2018 11:34 02/13/2020 07:57  MICROALB/CREAT RATIO Latest Ref Range: 0.0 - 30.0 mg/g 30.3 (H) 145 (H) 18.6    ASSESSMENT / PLAN / RECOMMENDATIONS:   1) Type 2  Diabetes Mellitus, with improving glucose control, With Neuropathy and CAD  complications - Most recent A1c of 7.4 %. Goal A1c < 7.5 %.    - I have congratulated him on the weight loss and improved glycemic control - I have encouraged him to continue with lifestyle changes  - We discussed add-on therapy with SGLT-2 inhibitors. He is a great candidate given prior CAD history but he declines at this time     Plan: MEDICATIONS:  Increase  Novolog Mix to 30 units BID with meals   Metformin 1000 mg BID   EDUCATION / INSTRUCTIONS:  BG monitoring instructions: Patient is instructed to check his blood sugars 2 times a day, fasting and supper .  Call [facility] if: BG persistently < 70 . I reviewed the Rule of 15 for the treatment of hypoglycemia in detail with the patient. Literature supplied.   2) Hypertriglyceridemia: Patient is on a statin. His TG were  very elevated but this has been slowly improving.     F/U in 6 months    Signed electronically by: Mack Guise, MD  [facility] End[facility]  [facility] [address] [place], [place] [address] Phone: [phone] FAX: [phone]   CC: Charlott Rakes, [place] [address] Phone: [phone]  Fax: [phone]  Return to Endocrinology clinic as below: Future Appointments  Date Time Provider [place]  02/26/2020  8:45 AM Jessy Oto, MD [facility] None

## 2020-02-17 NOTE — Procedures (Signed)
Lumbosacral Transforaminal Epidural Steroid Injection - Sub-Pedicular Approach with Fluoroscopic Guidance  Patient: Ryan Farmer      Date of Birth: February 03, 1956 MRN: [id number] PCP: Charlott Rakes, MD      Visit Date: 01/22/2020   Universal Protocol:    Date/Time: 01/22/2020  Consent Given By: the patient  Position: PRONE  Additional Comments: Vital signs were monitored before and after the procedure. Patient was prepped and draped in the usual sterile fashion. The correct patient, procedure, and site was verified.   Injection Procedure Details:  Procedure Site One Meds Administered:  Meds ordered this encounter  Medications  . methylPREDNISolone acetate (DEPO-MEDROL) injection 40 mg    Laterality: Bilateral  Location/Site:  L4-L5  Needle size: 22 G  Needle type: Spinal  Needle Placement: Transforaminal  Findings:    -Comments: Excellent flow of contrast along the nerve, nerve root and into the epidural space.  Procedure Details: After squaring off the end-plates to get a true AP view, the C-arm was positioned so that an oblique view of the foramen as noted above was visualized. The target area is just inferior to the "nose of the scotty dog" or sub pedicular. The soft tissues overlying this structure were infiltrated with 2-3 ml. of 1% Lidocaine without Epinephrine.  The spinal needle was inserted toward the target using a "trajectory" view along the fluoroscope beam.  Under AP and lateral visualization, the needle was advanced so it did not puncture dura and was located close the 6 O'Clock position of the pedical in AP tracterory. Biplanar projections were used to confirm position. Aspiration was confirmed to be negative for CSF and/or blood. A 1-2 ml. volume of Isovue-250 was injected and flow of contrast was noted at each level. Radiographs were obtained for documentation purposes.   After attaining the desired flow of contrast documented above, a 0.5 to 1.0 ml  test dose of 0.25% Marcaine was injected into each respective transforaminal space.  The patient was observed for 90 seconds post injection.  After no sensory deficits were reported, and normal lower extremity motor function was noted,   the above injectate was administered so that equal amounts of the injectate were placed at each foramen (level) into the transforaminal epidural space.   Additional Comments:  The patient tolerated the procedure well Dressing: 2 x 2 sterile gauze and Band-Aid    Post-procedure details: Patient was observed during the procedure. Post-procedure instructions were reviewed.  Patient left the clinic in stable condition.

## 2020-02-17 NOTE — Progress Notes (Signed)
KEEGHAN BIALY - 64 y.o. male MRN [id number]  Date of birth: [birth year]  Office Visit Note: Visit Date: 01/22/2020 PCP: Charlott Rakes, MD Referred by: Charlott Rakes, MD  Subjective: Chief Complaint  Patient presents with  . Lower Back - Pain   HPI: Ryan Farmer is a 64 y.o. male who comes in today at the request of Dr. Basil Dess for planned Bilateral L4-L5 Lumbar epidural steroid injection with fluoroscopic guidance.  The patient has failed conservative care including home exercise, medications, time and activity modification.  This injection will be diagnostic and hopefully therapeutic.  Please see requesting physician notes for further details and justification.  Patient continues to work on blood sugar and diabetes.  He really needs lumbar decompression surgery with Dr. Louanne Skye.  He will follow up with Dr. Louanne Skye depending on results of injection.   ROS Otherwise per HPI.  Assessment & Plan: Visit Diagnoses:  1. Lumbar radiculopathy   2. Spinal stenosis of lumbar region with neurogenic claudication     Plan: No additional findings.   Meds & Orders:  Meds ordered this encounter  Medications  . methylPREDNISolone acetate (DEPO-MEDROL) injection 40 mg    Orders Placed This Encounter  Procedures  . XR C-ARM NO REPORT  . Epidural Steroid injection    Follow-up: Return for visit to requesting physician as needed.   Procedures: No procedures performed  Lumbosacral Transforaminal Epidural Steroid Injection - Sub-Pedicular Approach with Fluoroscopic Guidance  Patient: Ryan Farmer      Date of Birth: 06/03/1956 MRN: [id number] PCP: Charlott Rakes, MD      Visit Date: 01/22/2020   Universal Protocol:    Date/Time: 01/22/2020  Consent Given By: the patient  Position: PRONE  Additional Comments: Vital signs were monitored before and after the procedure. Patient was prepped and draped in the usual sterile fashion. The correct patient, procedure, and site was  verified.   Injection Procedure Details:  Procedure Site One Meds Administered:  Meds ordered this encounter  Medications  . methylPREDNISolone acetate (DEPO-MEDROL) injection 40 mg    Laterality: Bilateral  Location/Site:  L4-L5  Needle size: 22 G  Needle type: Spinal  Needle Placement: Transforaminal  Findings:    -Comments: Excellent flow of contrast along the nerve, nerve root and into the epidural space.  Procedure Details: After squaring off the end-plates to get a true AP view, the C-arm was positioned so that an oblique view of the foramen as noted above was visualized. The target area is just inferior to the "nose of the scotty dog" or sub pedicular. The soft tissues overlying this structure were infiltrated with 2-3 ml. of 1% Lidocaine without Epinephrine.  The spinal needle was inserted toward the target using a "trajectory" view along the fluoroscope beam.  Under AP and lateral visualization, the needle was advanced so it did not puncture dura and was located close the 6 O'Clock position of the pedical in AP tracterory. Biplanar projections were used to confirm position. Aspiration was confirmed to be negative for CSF and/or blood. A 1-2 ml. volume of Isovue-250 was injected and flow of contrast was noted at each level. Radiographs were obtained for documentation purposes.   After attaining the desired flow of contrast documented above, a 0.5 to 1.0 ml test dose of 0.25% Marcaine was injected into each respective transforaminal space.  The patient was observed for 90 seconds post injection.  After no sensory deficits were reported, and normal lower extremity motor function was noted,  the above injectate was administered so that equal amounts of the injectate were placed at each foramen (level) into the transforaminal epidural space.   Additional Comments:  The patient tolerated the procedure well Dressing: 2 x 2 sterile gauze and Band-Aid    Post-procedure  details: Patient was observed during the procedure. Post-procedure instructions were reviewed.  Patient left the clinic in stable condition.      Clinical History: MRI LUMBAR SPINE WITHOUT CONTRAST  TECHNIQUE: Multiplanar, multisequence MR imaging of the lumbar spine was performed. No intravenous contrast was administered.  COMPARISON:  Lumbar spine radiographs 12/05/2017.  FINDINGS: Segmentation: Conventional anatomy assumed, with the last open disc space designated L5-S1.  Alignment:  Normal.  Vertebrae: No worrisome osseous lesion or acute fracture. Probable unilateral pars defect on the right without marrow edema. Facet mediated edema on the right at L3-4. there are partially bridging osteophytes along the anterior aspect of the right sacroiliac joint.  Conus medullaris: Extends to the upper L1 level and appears normal.  Paraspinal and other soft tissues: No significant paraspinal findings.  Disc levels:  T12-L1: Mild disc degeneration with anterior osteophytes. No spinal stenosis or nerve root encroachment.  L1-2: Mild disc bulging and osteophyte formation. No significant spinal stenosis or nerve root encroachment.  L2-3: Annular disc bulging and mild facet hypertrophy. Mild narrowing of the lateral recesses and foramina bilaterally without definite nerve root encroachment.  L3-4: Moderate annular disc bulging. There is advanced facet hypertrophy with bilateral facet joint effusions, subchondral cyst formation on the right and ligamentous thickening. These factors contribute to moderate spinal stenosis with narrowing of the lateral recesses and foramina, right greater than left.  L4-5: Loss of disc height with a broad-based central disc protrusion and small left-sided disc extrusion in the subarticular zone. Mild facet and ligamentous hypertrophy. Mild narrowing of the lateral recesses, worse on the left. The foramina remain  sufficiently patent.  L5-S1: Broad-based left paracentral disc protrusion is largely contained within the ventral epidural fat, although touches the S1 nerve roots. There is no nerve root displacement or compression. Mild bilateral facet hypertrophy. The foramina are patent.  IMPRESSION: 1. Moderate multifactorial spinal stenosis at L3-4 with moderate narrowing of the lateral recesses and foramina, right greater than left. Advanced facet hypertrophy at this level may contribute to back pain. 2. Asymmetric narrowing of the left lateral recess at L4-5 secondary to a small disc extrusion in the subarticular zone. This could contribute to left L5 nerve root encroachment. 3. Lesser spondylosis at the other levels as detailed above. No other significant spinal stenosis or nerve root encroachment.   Electronically Signed   By: Richardean Sale M.D.   On: 01/04/2019 10:41   He reports that he has been smoking cigarettes. He has a 50.00 pack-year smoking history. He has quit using smokeless tobacco.  His smokeless tobacco use included chew.  Recent Labs    04/15/19 0827 09/30/19 0955 02/13/20 0732  HGBA1C 10.4* 9.8* 7.4*    Objective:  VS:  HT:    WT:   BMI:     BP:(!) 141/85  HR:84bpm  TEMP: ( )  RESP:  Physical Exam Constitutional:      General: He is not in acute distress.    Appearance: Normal appearance. He is not ill-appearing.  HENT:     Head: Normocephalic and atraumatic.     Right Ear: External ear normal.     Left Ear: External ear normal.  Eyes:     Extraocular Movements: Extraocular movements  intact.  Cardiovascular:     Rate and Rhythm: Normal rate.     Pulses: Normal pulses.  Abdominal:     General: There is no distension.     Palpations: Abdomen is soft.  Musculoskeletal:        General: No tenderness or signs of injury.     Right lower leg: No edema.     Left lower leg: No edema.     Comments: Patient has good distal strength without clonus.   Skin:    Findings: No erythema or rash.  Neurological:     General: No focal deficit present.     Mental Status: He is alert and oriented to person, place, and time.     Sensory: No sensory deficit.     Motor: No weakness or abnormal muscle tone.     Coordination: Coordination normal.  Psychiatric:        Mood and Affect: Mood normal.        Behavior: Behavior normal.     Ortho Exam  Imaging: No results found.  Past Medical/Family/Surgical/Social History: Medications & Allergies reviewed per EMR, new medications updated. Patient Active Problem List   Diagnosis Date Noted  . Type 2 diabetes mellitus with microalbuminuria, with long-term current use of insulin ([place]) 10/14/2019  . Type 2 diabetes mellitus with diabetic polyneuropathy, with long-term current use of insulin ([place]) 04/15/2019  . Hypertriglyceridemia 04/15/2019  . Spinal stenosis of lumbar region with neurogenic claudication 03/07/2019  . Lumbar radiculopathy 03/07/2019  . Chronic bilateral low back pain with bilateral sciatica 03/07/2019  . CAD (coronary artery disease), native coronary artery, minimal by cardiac cath 01/08/19 01/09/2019  . Syncope and collapse   . Spondylosis without myelopathy or radiculopathy, lumbar region 04/26/2018  . Cocaine abuse ([place]) 12/19/2016  . Shingles outbreak 12/13/2016  . Diabetes type 2, uncontrolled ([place]) 04/19/2016  . Metatarsalgia 10/19/2015  . Back muscle spasm 11/18/2013  . Hyperlipidemia 11/18/2013  . HTN (hypertension) 11/22/2012  . Chronic pain syndrome 11/22/2012  . Tobacco use disorder 11/22/2012  . Dental caries 11/22/2012   Past Medical History:  Diagnosis Date  . Arthritis    All over  . CAD (coronary artery disease), native coronary artery, minimal by cardiac cath 01/08/19 01/09/2019  . Chronic pain syndrome   . Diabetes mellitus   . History of cocaine use   . History of kidney stones   . Hypercholesteremia   . Hypertension   . Low back pain   . Lumbar  radiculopathy   . Right leg pain   . Seborrheic dermatitis of scalp   . Shingles    Family History  Problem Relation Age of Onset  . Cancer Mother   . Stroke Father   . Heart disease Father   . Diabetes Father    Past Surgical History:  Procedure Laterality Date  . CIRCUMCISION N/A 03/16/2018   Procedure: CIRCUMCISION ADULT WITH PENILE BLOCK;  Surgeon: Alexis Frock, MD;  Location: [facility];  Service: Urology;  Laterality: N/A;  22 MINS  . KNEE ARTHROSCOPY Bilateral   . LEFT HEART CATH AND CORONARY ANGIOGRAPHY N/A 01/08/2019   Procedure: LEFT HEART CATH AND CORONARY ANGIOGRAPHY;  Surgeon: Troy Sine, MD;  Location: [facility];  Service: Cardiovascular;  Laterality: N/A;   Social History   Occupational History  . Not on file  Tobacco Use  . Smoking status: Current Every Day Smoker    Packs/day: 1.00    Years: 50.00  Pack years: 50.00    Types: Cigarettes  . Smokeless tobacco: Former Systems developer    Types: Secondary school teacher  . Vaping Use: Never used  Substance and Sexual Activity  . Alcohol use: Yes    Comment: occ  . Drug use: Yes    Types: Marijuana, Cocaine    Comment: twice a week  . Sexual activity: Not on file

## 2020-02-26 ENCOUNTER — Other Ambulatory Visit: Payer: Self-pay | Admitting: Specialist

## 2020-02-26 ENCOUNTER — Ambulatory Visit (INDEPENDENT_AMBULATORY_CARE_PROVIDER_SITE_OTHER): Payer: Medicare Other | Admitting: Specialist

## 2020-02-26 ENCOUNTER — Encounter: Payer: Self-pay | Admitting: Specialist

## 2020-02-26 VITALS — BP 110/66 | HR 64 | Ht 72.0 in | Wt 200.0 lb

## 2020-02-26 DIAGNOSIS — M48062 Spinal stenosis, lumbar region with neurogenic claudication: Secondary | ICD-10-CM

## 2020-02-26 DIAGNOSIS — Z794 Long term (current) use of insulin: Secondary | ICD-10-CM | POA: Diagnosis not present

## 2020-02-26 DIAGNOSIS — M5416 Radiculopathy, lumbar region: Secondary | ICD-10-CM

## 2020-02-26 DIAGNOSIS — M5136 Other intervertebral disc degeneration, lumbar region: Secondary | ICD-10-CM

## 2020-02-26 DIAGNOSIS — I2510 Atherosclerotic heart disease of native coronary artery without angina pectoris: Secondary | ICD-10-CM | POA: Diagnosis not present

## 2020-02-26 DIAGNOSIS — E11610 Type 2 diabetes mellitus with diabetic neuropathic arthropathy: Secondary | ICD-10-CM

## 2020-02-26 DIAGNOSIS — M4316 Spondylolisthesis, lumbar region: Secondary | ICD-10-CM | POA: Diagnosis not present

## 2020-02-26 DIAGNOSIS — M4807 Spinal stenosis, lumbosacral region: Secondary | ICD-10-CM | POA: Diagnosis not present

## 2020-02-26 MED ORDER — HYDROCODONE-ACETAMINOPHEN 5-325 MG PO TABS: 1.0000 | ORAL_TABLET | Freq: Four times a day (QID) | ORAL | 0 refills | Status: DC | PRN

## 2020-02-26 MED ORDER — GABAPENTIN 300 MG PO CAPS: ORAL_CAPSULE | ORAL | 2 refills | Status: DC

## 2020-02-26 NOTE — Patient Instructions (Signed)
Plan: Avoid bending, stooping and avoid lifting weights greater than 10 lbs. Avoid prolong standing and walking. Order for a new walker with wheels. Surgery scheduling secretary Kandice Hams, will call you in the next week to schedule for surgery.  Surgery recommended is a two level lumbar decompression L3-4 and L4-5 for spinal stenosis with fusion of the L3-4 level for spondylolisthesis the fusion would be done with rods, screws and cages with local bone graft and allograft (donor bone graft). Take hydrocodone for for pain. Risk of surgery includes risk of infection 1 in 100 patients, bleeding 1/2% chance you would need a transfusion.   Risk to the nerves is one in 10,000. You will need to use a brace for 3 months and wean from the brace on the 4th month. Expect improved walking and standing tolerance. Expect relief of leg pain but numbness may persist depending on the length and degree of pressure that has been present.

## 2020-02-26 NOTE — Progress Notes (Signed)
Office Visit Note   Patient: Ryan Farmer           Date of Birth: [birth year]           MRN: [id number] Visit Date: 02/26/2020              Requested by: Ryan Rakes, MD [place],  [place] [id number] PCP: Ryan Rakes, MD   Assessment & Plan: Visit Diagnoses:  1. Spondylolisthesis, lumbar region   2. Spinal stenosis of lumbar region with neurogenic claudication   3. Degenerative disc disease, lumbar   4. Type 2 diabetes mellitus with diabetic neuropathic arthropathy, with long-term current use of insulin (HCC)   5. Spinal stenosis of lumbosacral region   6. Lumbar radiculopathy     Plan: Avoid bending, stooping and avoid lifting weights greater than 10 lbs. Avoid prolong standing and walking. Order for a new walker with wheels. Surgery scheduling secretary Ryan Farmer, will call you in the next week to schedule for surgery.  Surgery recommended is a two level lumbar decompression L3-4 and L4-5 for spinal stenosis with fusion of the L3-4 level for spondylolisthesis the fusion would be done with rods, screws and cages with local bone graft and allograft (donor bone graft). Take hydrocodone for for pain. Risk of surgery includes risk of infection 1 in 100 patients, bleeding 1/2% chance you would need a transfusion.   Risk to the nerves is one in 10,000. You will need to use a brace for 3 months and wean from the brace on the 4th month. Expect improved walking and standing tolerance. Expect relief of leg pain but numbness may persist depending on the length and degree of pressure that has been present.    Follow-Up Instructions: No follow-ups on file.   Orders:  No orders of the defined types were placed in this encounter.  No orders of the defined types were placed in this encounter.     Procedures: No procedures performed   Clinical Data: No additional findings.   Subjective: Chief Complaint  Patient presents with  . Lower Back -  Follow-up    Had Bilateral L4-5 TF injection with Dr. Ernestina Farmer on 01/22/20, he states that he got 50% relief with it. Wants to discuss getting the space that the nerves come out of opened up, states that they are closing in.    64 year old male with history of right and left buttock pain and lower back pain in the buttocks and the small of the back. Increased  Standing and walking especially if he lifts any thing and carries it. He has been to his primary care MD and his HgA1 c is in the 7.2 range. His major complaints are standing and walking and buttock pain and thigh pain with standing. Improvement with sitting and stooping and  Leaning on cart while grocery shopping. He had bilateral L4-5 ESI which relieved the pain for several weeks but the pain has recurred. He Does not wish to have any further ESIs due to pain in the back and right leg that lasted about 2-3 days following the ESIs.    Review of Systems  Constitutional: Negative.  Negative for activity change, appetite change, chills, diaphoresis, fatigue, fever and unexpected weight change.  HENT: Negative.  Negative for congestion, dental problem, drooling, ear discharge, ear pain, facial swelling, hearing loss, mouth sores, nosebleeds, postnasal drip, rhinorrhea, sinus pressure, sinus pain, sneezing, sore throat, tinnitus, trouble swallowing and voice change.   Eyes: Negative.  Respiratory: Negative.  Negative for apnea, cough, choking, chest tightness, shortness of breath, wheezing and stridor.   Cardiovascular: Negative.  Negative for chest pain, palpitations and leg swelling.  Gastrointestinal: Negative.  Negative for abdominal distention, abdominal pain, anal bleeding, blood in stool, constipation, diarrhea, nausea, rectal pain and vomiting.  Endocrine: Negative.  Negative for cold intolerance, heat intolerance, polydipsia, polyphagia and polyuria.  Genitourinary: Negative.  Negative for difficulty urinating, dysuria, enuresis, flank  pain, frequency, genital sores, hematuria, penile pain and urgency.  Musculoskeletal: Negative.  Negative for arthralgias, back pain, gait problem, joint swelling, myalgias, neck pain and neck stiffness.  Skin: Negative.  Negative for color change, pallor, rash and wound.  Allergic/Immunologic: Negative.  Negative for environmental allergies, food allergies and immunocompromised state.  Neurological: Negative.  Negative for dizziness, tremors, seizures, syncope, facial asymmetry, speech difficulty, weakness, light-headedness, numbness and headaches.  Hematological: Negative.  Negative for adenopathy. Does not bruise/bleed easily.  Psychiatric/Behavioral: Negative.  Negative for agitation, behavioral problems, confusion, decreased concentration, dysphoric mood, hallucinations, self-injury, sleep disturbance and suicidal ideas. The patient is not nervous/anxious and is not hyperactive.      Objective: Vital Signs: BP 110/66 (BP Location: Right Arm, Patient Position: Sitting)   Pulse 64   Ht 6' (1.829 m)   Wt 200 lb (90.7 kg)   BMI 27.12 kg/m   Physical Exam Constitutional:      Appearance: He is well-developed.  HENT:     Head: Normocephalic and atraumatic.  Eyes:     Pupils: Pupils are equal, round, and reactive to light.  Pulmonary:     Effort: Pulmonary effort is normal.     Breath sounds: Normal breath sounds.  Abdominal:     General: Bowel sounds are normal.     Palpations: Abdomen is soft.  Musculoskeletal:     Cervical back: Normal range of motion and neck supple.     Lumbar back: Negative right straight leg raise test and negative left straight leg raise test.  Skin:    General: Skin is warm and dry.  Neurological:     Mental Status: He is alert and oriented to person, place, and time.  Psychiatric:        Behavior: Behavior normal.        Thought Content: Thought content normal.        Judgment: Judgment normal.     Back Exam   Tenderness  The patient is  experiencing tenderness in the lumbar.  Range of Motion  Extension: abnormal  Flexion: abnormal  Lateral bend right: normal  Lateral bend left: normal  Rotation right: normal  Rotation left: normal   Muscle Strength  Right Quadriceps:  5/5  Left Quadriceps:  5/5  Right Hamstrings:  5/5   Tests  Straight leg raise right: negative Straight leg raise left: negative  Reflexes  Patellar:  0/4 normal Achilles:  0/4 normal Babinski's sign: normal   Other  Toe walk: normal Heel walk: normal Sensation: normal Gait: normal  Erythema: no back redness Scars: absent      Specialty Comments:  No specialty comments available.  Imaging: No results found.   PMFS History: Patient Active Problem List   Diagnosis Date Noted  . Type 2 diabetes mellitus with microalbuminuria, with long-term current use of insulin ([place]) 10/14/2019  . Type 2 diabetes mellitus with diabetic polyneuropathy, with long-term current use of insulin ([place]) 04/15/2019  . Hypertriglyceridemia 04/15/2019  . Spinal stenosis of lumbar region with neurogenic claudication 03/07/2019  . Lumbar  radiculopathy 03/07/2019  . Chronic bilateral low back pain with bilateral sciatica 03/07/2019  . CAD (coronary artery disease), native coronary artery, minimal by cardiac cath 01/08/19 01/09/2019  . Syncope and collapse   . Spondylosis without myelopathy or radiculopathy, lumbar region 04/26/2018  . Cocaine abuse ([place]) 12/19/2016  . Shingles outbreak 12/13/2016  . Diabetes type 2, uncontrolled ([place]) 04/19/2016  . Metatarsalgia 10/19/2015  . Back muscle spasm 11/18/2013  . Hyperlipidemia 11/18/2013  . HTN (hypertension) 11/22/2012  . Chronic pain syndrome 11/22/2012  . Tobacco use disorder 11/22/2012  . Dental caries 11/22/2012   Past Medical History:  Diagnosis Date  . Arthritis    All over  . CAD (coronary artery disease), native coronary artery, minimal by cardiac cath 01/08/19 01/09/2019  . Chronic pain syndrome    . Diabetes mellitus   . History of cocaine use   . History of kidney stones   . Hypercholesteremia   . Hypertension   . Low back pain   . Lumbar radiculopathy   . Right leg pain   . Seborrheic dermatitis of scalp   . Shingles     Family History  Problem Relation Age of Onset  . Cancer Mother   . Stroke Father   . Heart disease Father   . Diabetes Father     Past Surgical History:  Procedure Laterality Date  . CIRCUMCISION N/A 03/16/2018   Procedure: CIRCUMCISION ADULT WITH PENILE BLOCK;  Surgeon: Alexis Frock, MD;  Location: [facility];  Service: Urology;  Laterality: N/A;  50 MINS  . KNEE ARTHROSCOPY Bilateral   . LEFT HEART CATH AND CORONARY ANGIOGRAPHY N/A 01/08/2019   Procedure: LEFT HEART CATH AND CORONARY ANGIOGRAPHY;  Surgeon: Troy Sine, MD;  Location: [facility];  Service: Cardiovascular;  Laterality: N/A;   Social History   Occupational History  . Not on file  Tobacco Use  . Smoking status: Current Every Day Smoker    Packs/day: 1.00    Years: 50.00    Pack years: 50.00    Types: Cigarettes  . Smokeless tobacco: Former Systems developer    Types: Secondary school teacher  . Vaping Use: Never used  Substance and Sexual Activity  . Alcohol use: Yes    Comment: occ  . Drug use: Yes    Types: Marijuana, Cocaine    Comment: twice a week  . Sexual activity: Not on file

## 2020-03-23 ENCOUNTER — Telehealth: Payer: Self-pay

## 2020-03-23 NOTE — Telephone Encounter (Signed)
   [facility] Pre-operative Risk Assessment    HEARTCARE STAFF: - Please ensure there is not already an duplicate clearance open for this procedure. - Under Visit Info/Reason for Call, type in Other and utilize the format Clearance MM/DD/YY or Clearance TBD. Do not use dashes or single digits. - If request is for dental extraction, please clarify the # of teeth to be extracted.  Request for surgical clearance:  1. What type of surgery is being performed? Lumbar decompression w/fusion of L3-4, rods, screws, and cages  2. When is this surgery scheduled?  tbd  3. What type of clearance is required (medical clearance vs. Pharmacy clearance to hold med vs. Both)? both  4. Are there any medications that need to be held prior to surgery and how long? Not listed  5. Practice name and name of physician performing surgery? [facility]- Dr. Basil Dess  6. What is the office phone number? [phone]   7.   What is the office fax number? [phone]  8.   Anesthesia type (None, local, MAC, general) ? General L3-4, L4-5   Ryan Farmer Ryan Farmer 03/23/2020, 9:49 AM  _________________________________________________________________   (provider comments below)

## 2020-03-24 NOTE — Telephone Encounter (Signed)
   Primary Cardiologist:Thomas Claiborne [place], MD  Chart reviewed as part of pre-operative protocol coverage. Because of Ryan Farmer's past medical history and time since last visit, they will require a follow-up visit in order to better assess preoperative cardiovascular risk.  Pre-op covering staff: - Please schedule appointment and call patient to inform them. If patient already had an upcoming appointment within acceptable timeframe, please add "pre-op clearance" to the appointment notes so provider is aware. - Please contact requesting surgeon's office via preferred method (i.e, phone, fax) to inform them of need for appointment prior to surgery.  If applicable, this message will also be routed to pharmacy pool and/or primary cardiologist for input on holding anticoagulant/antiplatelet agent as requested below so that this information is available to the clearing provider at time of patient's appointment.   [place], [place]  03/24/2020, 3:29 PM

## 2020-03-24 NOTE — Telephone Encounter (Signed)
Left message on voicemail for patient to return call to the office.

## 2020-03-27 NOTE — Telephone Encounter (Signed)
2nd attempt to reach pt to discuss need for an appointment.

## 2020-03-30 NOTE — Telephone Encounter (Signed)
3rd attempt to reach pt with no success. Will route back to requesting surgeons office to make them aware and remove from pre op pool.

## 2020-03-31 ENCOUNTER — Other Ambulatory Visit: Payer: Self-pay | Admitting: Physician Assistant

## 2020-05-09 ENCOUNTER — Other Ambulatory Visit: Payer: Self-pay | Admitting: Specialist

## 2020-05-12 ENCOUNTER — Telehealth: Payer: Self-pay | Admitting: Specialist

## 2020-05-12 NOTE — Telephone Encounter (Signed)
Pt came into the office requesting for his hydrocodone to be filled.  Please advise.

## 2020-05-19 ENCOUNTER — Other Ambulatory Visit: Payer: Self-pay | Admitting: Specialist

## 2020-05-19 MED ORDER — HYDROCODONE-ACETAMINOPHEN 5-325 MG PO TABS: 1.0000 | ORAL_TABLET | Freq: Four times a day (QID) | ORAL | 0 refills | Status: DC | PRN

## 2020-05-19 NOTE — Telephone Encounter (Signed)
Rx refill Hydrocodone   [facility] @ [place]

## 2020-06-28 ENCOUNTER — Emergency Department (HOSPITAL_COMMUNITY): Payer: Medicare Other

## 2020-06-28 ENCOUNTER — Emergency Department (HOSPITAL_COMMUNITY)
Admission: EM | Admit: 2020-06-28 | Discharge: 2020-06-28 | Disposition: A | Discharge status: Home / Self Care | Payer: Medicare Other | Attending: Emergency Medicine | Admitting: Emergency Medicine

## 2020-06-28 ENCOUNTER — Encounter (HOSPITAL_COMMUNITY): Payer: Self-pay | Admitting: Emergency Medicine

## 2020-06-28 ENCOUNTER — Other Ambulatory Visit: Payer: Self-pay | Admitting: Internal Medicine

## 2020-06-28 ENCOUNTER — Other Ambulatory Visit: Payer: Self-pay

## 2020-06-28 DIAGNOSIS — R0602 Shortness of breath: Secondary | ICD-10-CM | POA: Diagnosis not present

## 2020-06-28 DIAGNOSIS — E1140 Type 2 diabetes mellitus with diabetic neuropathy, unspecified: Secondary | ICD-10-CM | POA: Insufficient documentation

## 2020-06-28 DIAGNOSIS — Z7982 Long term (current) use of aspirin: Secondary | ICD-10-CM | POA: Diagnosis not present

## 2020-06-28 DIAGNOSIS — R160 Hepatomegaly, not elsewhere classified: Secondary | ICD-10-CM | POA: Diagnosis not present

## 2020-06-28 DIAGNOSIS — N281 Cyst of kidney, acquired: Secondary | ICD-10-CM | POA: Diagnosis not present

## 2020-06-28 DIAGNOSIS — F17210 Nicotine dependence, cigarettes, uncomplicated: Secondary | ICD-10-CM | POA: Insufficient documentation

## 2020-06-28 DIAGNOSIS — Z794 Long term (current) use of insulin: Secondary | ICD-10-CM | POA: Diagnosis not present

## 2020-06-28 DIAGNOSIS — Z7984 Long term (current) use of oral hypoglycemic drugs: Secondary | ICD-10-CM | POA: Insufficient documentation

## 2020-06-28 DIAGNOSIS — M545 Low back pain, unspecified: Secondary | ICD-10-CM

## 2020-06-28 DIAGNOSIS — I2510 Atherosclerotic heart disease of native coronary artery without angina pectoris: Secondary | ICD-10-CM | POA: Insufficient documentation

## 2020-06-28 DIAGNOSIS — R109 Unspecified abdominal pain: Secondary | ICD-10-CM | POA: Diagnosis present

## 2020-06-28 DIAGNOSIS — Z96653 Presence of artificial knee joint, bilateral: Secondary | ICD-10-CM | POA: Insufficient documentation

## 2020-06-28 DIAGNOSIS — I700 Atherosclerosis of aorta: Secondary | ICD-10-CM | POA: Diagnosis not present

## 2020-06-28 DIAGNOSIS — Z79899 Other long term (current) drug therapy: Secondary | ICD-10-CM | POA: Diagnosis not present

## 2020-06-28 DIAGNOSIS — K760 Fatty (change of) liver, not elsewhere classified: Secondary | ICD-10-CM | POA: Diagnosis not present

## 2020-06-28 DIAGNOSIS — R079 Chest pain, unspecified: Secondary | ICD-10-CM | POA: Diagnosis not present

## 2020-06-28 DIAGNOSIS — M5459 Other low back pain: Secondary | ICD-10-CM | POA: Diagnosis not present

## 2020-06-28 DIAGNOSIS — I10 Essential (primary) hypertension: Secondary | ICD-10-CM | POA: Insufficient documentation

## 2020-06-28 DIAGNOSIS — R0789 Other chest pain: Secondary | ICD-10-CM | POA: Diagnosis not present

## 2020-06-28 LAB — TROPONIN I (HIGH SENSITIVITY): Troponin I (High Sensitivity): 4 ng/L (ref <18)

## 2020-06-28 LAB — CBC
HCT: 53.4 % — ABNORMAL HIGH (ref 39.0–52.0)
Hemoglobin: 16.8 g/dL (ref 13.0–17.0)
MCH: 29.2 pg (ref 26.0–34.0)
MCHC: 31.5 g/dL (ref 30.0–36.0)
MCV: 92.9 fL (ref 80.0–100.0)
Platelets: 196 10*3/uL (ref 150–400)
RBC: 5.75 MIL/uL (ref 4.22–5.81)
RDW: 13.8 % (ref 11.5–15.5)
WBC: 6.7 10*3/uL (ref 4.0–10.5)
nRBC: 0 % (ref 0.0–0.2)

## 2020-06-28 LAB — BASIC METABOLIC PANEL
Anion gap: 10 (ref 5–15)
BUN: 24 mg/dL — ABNORMAL HIGH (ref 8–23)
CO2: 24 mmol/L (ref 22–32)
Calcium: 9.3 mg/dL (ref 8.9–10.3)
Chloride: 109 mmol/L (ref 98–111)
Creatinine, Ser: 1.59 mg/dL — ABNORMAL HIGH (ref 0.61–1.24)
GFR, Estimated: 48 mL/min — ABNORMAL LOW (ref >60)
Glucose, Bld: 173 mg/dL — ABNORMAL HIGH (ref 70–99)
Potassium: 4.6 mmol/L (ref 3.5–5.1)
Sodium: 143 mmol/L (ref 135–145)

## 2020-06-28 LAB — URINALYSIS, ROUTINE W REFLEX MICROSCOPIC
Bacteria, UA: NONE SEEN
Bilirubin Urine: NEGATIVE
Glucose, UA: 150 mg/dL — AB
Ketones, ur: NEGATIVE mg/dL
Leukocytes,Ua: NEGATIVE
Nitrite: NEGATIVE
Protein, ur: 30 mg/dL — AB
Specific Gravity, Urine: 1.019 (ref 1.005–1.030)
pH: 5 (ref 5.0–8.0)

## 2020-06-28 MED ORDER — METHOCARBAMOL 500 MG PO TABS: 500.0000 mg | ORAL_TABLET | Freq: Three times a day (TID) | ORAL | 0 refills | Status: DC | PRN

## 2020-06-28 MED ORDER — KETOROLAC TROMETHAMINE 30 MG/ML IJ SOLN
30.0000 mg | Freq: Once | INTRAMUSCULAR | Status: AC
  Administered 2020-06-28: 16:00:00 30 mg via INTRAMUSCULAR
  Filled 2020-06-28: qty 1

## 2020-06-28 MED ORDER — PREDNISONE 20 MG PO TABS: 40.0000 mg | ORAL_TABLET | Freq: Every day | ORAL | 0 refills | Status: DC

## 2020-06-28 NOTE — ED Triage Notes (Signed)
C/o L flank pain/ back pain since Friday that is worse with movement.  Denies urinary complaints.  Also reports intermittent pain to L chest over the past week.

## 2020-06-28 NOTE — ED Notes (Signed)
Pt to CT

## 2020-06-28 NOTE — ED Provider Notes (Signed)
[facility] Provider Note   CSN: [id number] Arrival date & time: 06/28/20  1237     History Chief Complaint  Patient presents with  . Flank Pain  . Chest Pain    PAIGE VANDERWOUDE is a 64 y.o. male.  HPI Patient presents with 2 days of left flank/low back pain.  Has a history of chronic back pain but states this pain feels different.  Usually the pain in her low back is on both sides and will go to the left leg.  States this is a little further out on the left flank.  States sometimes he feels as if he has some urinary frequency and sometimes he feels as if he has difficulty urinating.  No diarrhea or constipation.  Patient sees Dr. Louanne Skye in the supposed to be having back surgery but they have been unable to get scheduled.  No relief with the patient's hydrocodone at home.  States he has been taking Tylenol also. Patient did have some chest pain a couple weeks ago.  States he was seen for it and discharged.  States no real chest pain now.  No fevers.  No cough.  Does have history of coronary artery disease.    Past Medical History:  Diagnosis Date  . Arthritis    All over  . CAD (coronary artery disease), native coronary artery, minimal by cardiac cath 01/08/19 01/09/2019  . Chronic pain syndrome   . Diabetes mellitus   . History of cocaine use   . History of kidney stones   . Hypercholesteremia   . Hypertension   . Low back pain   . Lumbar radiculopathy   . Right leg pain   . Seborrheic dermatitis of scalp   . Shingles     Patient Active Problem List   Diagnosis Date Noted  . Type 2 diabetes mellitus with microalbuminuria, with long-term current use of insulin ([place]) 10/14/2019  . Type 2 diabetes mellitus with diabetic polyneuropathy, with long-term current use of insulin ([place]) 04/15/2019  . Hypertriglyceridemia 04/15/2019  . Spinal stenosis of lumbar region with neurogenic claudication 03/07/2019  . Lumbar radiculopathy 03/07/2019  .  Chronic bilateral low back pain with bilateral sciatica 03/07/2019  . CAD (coronary artery disease), native coronary artery, minimal by cardiac cath 01/08/19 01/09/2019  . Syncope and collapse   . Spondylosis without myelopathy or radiculopathy, lumbar region 04/26/2018  . Cocaine abuse ([place]) 12/19/2016  . Shingles outbreak 12/13/2016  . Diabetes type 2, uncontrolled ([place]) 04/19/2016  . Metatarsalgia 10/19/2015  . Back muscle spasm 11/18/2013  . Hyperlipidemia 11/18/2013  . HTN (hypertension) 11/22/2012  . Chronic pain syndrome 11/22/2012  . Tobacco use disorder 11/22/2012  . Dental caries 11/22/2012    Past Surgical History:  Procedure Laterality Date  . CIRCUMCISION N/A 03/16/2018   Procedure: CIRCUMCISION ADULT WITH PENILE BLOCK;  Surgeon: Alexis Frock, MD;  Location: [facility];  Service: Urology;  Laterality: N/A;  84 MINS  . KNEE ARTHROSCOPY Bilateral   . LEFT HEART CATH AND CORONARY ANGIOGRAPHY N/A 01/08/2019   Procedure: LEFT HEART CATH AND CORONARY ANGIOGRAPHY;  Surgeon: Troy Sine, MD;  Location: [facility];  Service: Cardiovascular;  Laterality: N/A;       Family History  Problem Relation Age of Onset  . Cancer Mother   . Stroke Father   . Heart disease Father   . Diabetes Father     Social History   Tobacco Use  . Smoking  status: Current Every Day Smoker    Packs/day: 1.00    Years: 50.00    Pack years: 50.00    Types: Cigarettes  . Smokeless tobacco: Former Systems developer    Types: Secondary school teacher  . Vaping Use: Never used  Substance Use Topics  . Alcohol use: Yes    Comment: occ  . Drug use: Yes    Types: Marijuana, Cocaine    Comment: twice a week    Home Medications Prior to Admission medications   Medication Sig Start Date End Date Taking? Authorizing Provider  aspirin 81 MG chewable tablet Chew 1 tablet (81 mg total) by mouth daily. 01/10/19   Isaiah Serge, NP  atorvastatin (LIPITOR) 40 MG tablet Take 40 mg by mouth  daily.    [provider]  atorvastatin (LIPITOR) 40 MG tablet Take 1 tablet (40 mg total) by mouth daily. 11/12/19   Almyra Deforest, PA  baclofen (LIORESAL) 10 MG tablet TAKE 1 TABLET(10 MG) BY MOUTH TWICE DAILY 01/14/20   Jessy Oto, MD  benzonatate (TESSALON PERLES) 100 MG capsule Take 1 capsule (100 mg total) by mouth 3 (three) times daily as needed for cough. Patient not taking: Reported on 02/13/2020 07/23/19   Almyra Deforest, PA  Blood Glucose Monitoring Suppl (ACCU-CHEK AVIVA PLUS) w/Device KIT 1 Device by Does not apply route 3 (three) times daily after meals. 12/13/16   Funches, Adriana Mccallum, MD  diclofenac (VOLTAREN) 50 MG EC tablet TAKE 1 TABLET(50 MG) BY MOUTH TWICE DAILY 05/11/20   Jessy Oto, MD  gabapentin (NEURONTIN) 300 MG capsule TAKE 1 CAPSULE(300 MG) BY MOUTH THREE TIMES DAILY 05/11/20   Jessy Oto, MD  glucose blood (ACCU-CHEK GUIDE) test strip TEST TWICE DAILY AS DIRECTED 06/21/19   Shamleffer, Melanie Crazier, MD  HYDROcodone-acetaminophen (NORCO/VICODIN) 5-325 MG tablet Take 1 tablet by mouth every 6 (six) hours as needed for moderate pain. 05/19/20   Jessy Oto, MD  insulin aspart protamine - aspart (NOVOLOG MIX 70/30 FLEXPEN) (70-30) 100 UNIT/ML FlexPen Inject 0.28 mLs (28 Units total) into the skin 2 (two) times daily. 10/14/19   Shamleffer, Melanie Crazier, MD  Insulin Pen Needle 32G X 4 MM MISC 1 Device by Does not apply route 2 (two) times daily with a meal. 06/11/19   Shamleffer, Melanie Crazier, MD  Lancets (ACCU-CHEK SOFT TOUCH) lancets Use as instructed to test blood sugar 3 times daily DX E11.42 04/17/19   Shamleffer, Melanie Crazier, MD  lisinopril (ZESTRIL) 2.5 MG tablet Take 1 tablet (2.5 mg total) by mouth daily. Kidney protection 01/16/19   Lendon Colonel, NP  loratadine (CLARITIN) 10 MG tablet TAKE 1 TABLET(10 MG) BY MOUTH DAILY 03/31/20   Almyra Deforest, PA  metFORMIN (GLUCOPHAGE) 1000 MG tablet TAKE 1 TABLET(1000 MG) BY MOUTH TWICE DAILY WITH A MEAL 11/11/19    Shamleffer, Melanie Crazier, MD  methocarbamol (ROBAXIN) 500 MG tablet Take 1 tablet (500 mg total) by mouth every 8 (eight) hours as needed for muscle spasms. 06/28/20   Davonna Belling, MD  omega-3 acid ethyl esters (LOVAZA) 1 g capsule Take 1 capsule (1 g total) by mouth 2 (two) times daily. Patient not taking: Reported on 02/13/2020 08/30/19   Almyra Deforest, PA  predniSONE (DELTASONE) 20 MG tablet Take 2 tablets (40 mg total) by mouth daily. 06/28/20   Davonna Belling, MD    Allergies    Patient has no known allergies.  Review of Systems   Review of Systems  Constitutional: Negative for  appetite change.  HENT: Negative for congestion.   Respiratory: Negative for shortness of breath.   Cardiovascular: Positive for chest pain.  Gastrointestinal: Negative for abdominal pain.  Genitourinary: Positive for flank pain.  Musculoskeletal: Negative for gait problem.  Skin: Negative for pallor and rash.  Psychiatric/Behavioral: Negative for confusion.    Physical Exam Updated Vital Signs BP (!) 150/67   Pulse 72   Temp 98.4 F (36.9 C) (Oral)   Resp 17   Ht 6' (1.829 m)   Wt 101 kg   SpO2 96%   BMI 30.20 kg/m   Physical Exam Vitals and nursing note reviewed.  HENT:     Head: Normocephalic and atraumatic.  Cardiovascular:     Rate and Rhythm: Normal rate and regular rhythm.  Pulmonary:     Breath sounds: No wheezing, rhonchi or rales.  Chest:     Chest wall: No tenderness.  Abdominal:     Palpations: There is no mass.     Tenderness: There is no abdominal tenderness.  Musculoskeletal:     Right lower leg: No edema.     Left lower leg: No edema.  Skin:    General: Skin is warm.     Capillary Refill: Capillary refill takes less than 2 seconds.  Neurological:     Mental Status: He is alert and oriented to person, place, and time.     ED Results / Procedures / Treatments   Labs (all labs ordered are listed, but only abnormal results are displayed) Labs Reviewed  BASIC  METABOLIC PANEL - Abnormal; Notable for the following components:      Result Value   Glucose, Bld 173 (*)    BUN 24 (*)    Creatinine, Ser 1.59 (*)    GFR, Estimated 48 (*)    All other components within normal limits  CBC - Abnormal; Notable for the following components:   HCT 53.4 (*)    All other components within normal limits  URINALYSIS, ROUTINE W REFLEX MICROSCOPIC - Abnormal; Notable for the following components:   Glucose, UA 150 (*)    Hgb urine dipstick SMALL (*)    Protein, ur 30 (*)    All other components within normal limits  TROPONIN I (HIGH SENSITIVITY)    EKG EKG Interpretation  Date/Time:  Sunday June 28 2020 13:11:10 EST Ventricular Rate:  84 PR Interval:  176 QRS Duration: 86 QT Interval:  362 QTC Calculation: 427 R Axis:   59 Text Interpretation: Normal sinus rhythm Cannot rule out Anterior infarct , age undetermined Abnormal ECG No STEMI Confirmed by Octaviano Glow [phone]) on 06/28/2020 3:03:28 PM   Radiology DG Chest 2 View  Result Date: 06/28/2020 CLINICAL DATA:  Left chest pain for 3 days. Shortness of breath in the patient tries to get up. EXAM: CHEST - 2 VIEW COMPARISON:  PA and lateral chest 01/08/2019. FINDINGS: Lungs clear. Heart size normal. Aortic atherosclerosis. No pneumothorax or pleural fluid. No acute or focal bony abnormality. IMPRESSION: No acute disease. Aortic Atherosclerosis (ICD10-I70.0). Electronically Signed   By: Inge Rise M.D.   On: 06/28/2020 14:12   CT Renal Stone Study  Result Date: 06/28/2020 CLINICAL DATA:  Onset left flank pain 06/26/2020. EXAM: CT ABDOMEN AND PELVIS WITHOUT CONTRAST TECHNIQUE: Multidetector CT imaging of the abdomen and pelvis was performed following the standard protocol without IV contrast. COMPARISON:  CT abdomen and pelvis 05/24/2006. FINDINGS: Lower chest: Lung bases clear.  No pleural or pericardial effusion. Hepatobiliary: No focal liver abnormality  is seen. No gallstones, gallbladder  wall thickening, or biliary dilatation. There is diffuse fatty infiltration of the liver and the liver measures 21 cm craniocaudal. Pancreas: Unremarkable. No pancreatic ductal dilatation or surrounding inflammatory changes. Spleen: Normal in size without focal abnormality. Adrenals/Urinary Tract: The adrenal glands appear normal. The right kidney is normal in appearance. No urinary tract stones. There is a lesion off the lower pole of the left kidney measuring approximately 5.7 cm craniocaudal by 4.7 cm transverse by 5.4 cm AP. The lesion has Hounsfield unit measurements of 18 compatible with a simple cyst. It is new since the prior CT. Ureters and urinary bladder appear normal. Stomach/Bowel: Stomach is within normal limits. Appendix appears normal. No evidence of bowel wall thickening, distention, or inflammatory changes. Scattered diverticulosis without diverticulitis noted. Vascular/Lymphatic: Aortic atherosclerosis. No enlarged abdominal or pelvic lymph nodes. Reproductive: Prostate is unremarkable. Other: None. Musculoskeletal: No acute or focal abnormality. IMPRESSION: Negative for urinary tract stone. No acute abnormality abdomen or pelvis. New simple cyst lower pole left kidney noted. Hepatomegaly and fatty infiltration of the liver. Diverticulosis without diverticulitis. Aortic Atherosclerosis (ICD10-I70.0). Electronically Signed   By: Inge Rise M.D.   On: 06/28/2020 15:09    Procedures Procedures (including critical care time)  Medications Ordered in ED Medications  ketorolac (TORADOL) 30 MG/ML injection 30 mg (has no administration in time range)    ED Course  I have reviewed the triage vital signs and the nursing notes.  Pertinent labs & imaging results that were available during my care of the patient were reviewed by me and considered in my medical decision making (see chart for details).    MDM Rules/Calculators/A&P                         Patient with acute on chronic low  back pain.  This is more lateral.  With potential urinary symptoms and flank pain CT scan done and reassuring.  Does have cyst over the kidney that can be followed.  Think likely musculoskeletal back pain.  Will give shot of Toradol here and adjust medications to add muscle x-ray give short course of steroids.  Follow-up with PCP and Dr. Louanne Skye.   Initial differential diagnosis included but was not limited to musculoskeletal back pain.  AAA, kidney stone. Final Clinical Impression(s) / ED Diagnoses Final diagnoses:  Low back pain without sciatica, unspecified back pain laterality, unspecified chronicity    Rx / DC Orders ED Discharge Orders         Ordered    methocarbamol (ROBAXIN) 500 MG tablet  Every 8 hours PRN        06/28/20 1535    predniSONE (DELTASONE) 20 MG tablet  Daily        06/28/20 1535           Davonna Belling, MD 06/28/20 1535

## 2020-06-28 NOTE — Discharge Instructions (Addendum)
Take the steroids and the muscle asked her to help with the back pain.  If you are still on the baclofen stop that or take the baclofen instead of the Robaxin.  Follow-up with Dr. Louanne Skye and your primary care doctor.

## 2020-06-28 NOTE — ED Notes (Signed)
Patient verbalizes understanding of discharge instructions. Opportunity for questioning and answers were provided. Armband removed by staff, pt discharged from ED in wheelchair to home.   

## 2020-07-02 ENCOUNTER — Other Ambulatory Visit: Payer: Self-pay | Admitting: Specialist

## 2020-08-10 ENCOUNTER — Other Ambulatory Visit: Payer: Self-pay

## 2020-08-10 MED ORDER — METHOCARBAMOL 500 MG PO TABS: 500.0000 mg | ORAL_TABLET | Freq: Three times a day (TID) | ORAL | 0 refills | Status: DC | PRN

## 2020-08-10 MED ORDER — HYDROCODONE-ACETAMINOPHEN 5-325 MG PO TABS: 1.0000 | ORAL_TABLET | Freq: Four times a day (QID) | ORAL | 0 refills | Status: DC | PRN

## 2020-08-10 NOTE — Telephone Encounter (Signed)
Pt came into the office requesting a refill on hydrocodone  The pt requested if we could refill these medications he received at the hospital  prednisone and methocarbamol

## 2020-08-18 ENCOUNTER — Other Ambulatory Visit: Payer: Self-pay

## 2020-08-20 ENCOUNTER — Encounter: Payer: Self-pay | Admitting: Internal Medicine

## 2020-08-20 ENCOUNTER — Other Ambulatory Visit: Payer: Self-pay | Admitting: Specialist

## 2020-08-20 ENCOUNTER — Other Ambulatory Visit: Payer: Self-pay

## 2020-08-20 ENCOUNTER — Ambulatory Visit (INDEPENDENT_AMBULATORY_CARE_PROVIDER_SITE_OTHER): Payer: Medicare Other | Admitting: Internal Medicine

## 2020-08-20 VITALS — BP 118/80 | HR 66 | Ht 72.0 in | Wt 226.5 lb

## 2020-08-20 DIAGNOSIS — E1142 Type 2 diabetes mellitus with diabetic polyneuropathy: Secondary | ICD-10-CM

## 2020-08-20 DIAGNOSIS — E1165 Type 2 diabetes mellitus with hyperglycemia: Secondary | ICD-10-CM

## 2020-08-20 DIAGNOSIS — E1129 Type 2 diabetes mellitus with other diabetic kidney complication: Secondary | ICD-10-CM

## 2020-08-20 DIAGNOSIS — Z794 Long term (current) use of insulin: Secondary | ICD-10-CM

## 2020-08-20 DIAGNOSIS — R809 Proteinuria, unspecified: Secondary | ICD-10-CM | POA: Diagnosis not present

## 2020-08-20 DIAGNOSIS — E781 Pure hyperglyceridemia: Secondary | ICD-10-CM

## 2020-08-20 DIAGNOSIS — E1159 Type 2 diabetes mellitus with other circulatory complications: Secondary | ICD-10-CM

## 2020-08-20 LAB — POCT GLYCOSYLATED HEMOGLOBIN (HGB A1C): Hemoglobin A1C: 8.8 % — AB (ref 4.0–5.6)

## 2020-08-20 MED ORDER — NOVOLOG MIX 70/30 FLEXPEN (70-30) 100 UNIT/ML ~~LOC~~ SUPN: 34.0000 [IU] | PEN_INJECTOR | Freq: Two times a day (BID) | SUBCUTANEOUS | 3 refills | Status: DC

## 2020-08-20 MED ORDER — DAPAGLIFLOZIN PROPANEDIOL 5 MG PO TABS: 5.0000 mg | ORAL_TABLET | Freq: Every day | ORAL | 6 refills | Status: DC

## 2020-08-20 NOTE — Patient Instructions (Signed)
-   Increase Novolog Mix to 34 units with Breakfast and 34 units with Supper - Continue Metformin 1000 mg twice daily  - Start Faxiga 5 mg, 1 tablet daily       HOW TO TREAT LOW BLOOD SUGARS (Blood sugar LESS THAN 70 MG/DL)  Please follow the RULE OF 15 for the treatment of hypoglycemia treatment (when your (blood sugars are less than 70 mg/dL)    STEP 1: Take 15 grams of carbohydrates when your blood sugar is low, which includes:   3-4 GLUCOSE TABS  OR  3-4 OZ OF JUICE OR REGULAR SODA OR  ONE TUBE OF GLUCOSE GEL     STEP 2: RECHECK blood sugar in 15 MINUTES STEP 3: If your blood sugar is still low at the 15 minute recheck --> then, go back to STEP 1 and treat AGAIN with another 15 grams of carbohydrates.

## 2020-08-20 NOTE — Progress Notes (Signed)
Name: Ryan Farmer  Age/ Sex: 65 y.o., male   MRN/ DOB: [id number], 05-May-1956     PCP: Charlott Rakes, MD   Reason for Endocrinology Evaluation: Type 2 Diabetes Mellitus  Initial Endocrine Consultative Visit: 04/15/2019    PATIENT IDENTIFIER: [name] is a 65 y.o. male with a past medical history of CAD, T2DM, Hyperlipidemia and polysubstance abuse. The patient has followed with Endocrinology clinic since 04/15/2019 for consultative assistance with management of his diabetes.  DIABETIC HISTORY:  Ryan Farmer was diagnosed with DM in 2011. He has been on SU in the past, has been on metformin since diagnosis, insulin started in 2018. His hemoglobin A1c has ranged from 6.4% in 2017, peaking at 12.6% in 2020.  On his initial visit to our clinic he was on Lantus and Metformin with an A1c 10.4% . We stopped lantus and started a novolog Mix.   Receives intra-articular injections periodically.    Lives with sister, on disability ( can not read or write)    Drinks 12-18 packs a few times a month   SUBJECTIVE:   During the last visit (02/13/2020): A1c 7.4 % . Continued metformin and Novolog Mix .      Today (08/20/2020): Ryan Farmer is here for a follow up on diabetes management.   He checks his blood sugars occasionally . The patient has not had hypoglycemic episodes since the last clinic visit.   He received a pack of steroid 06/2020  Denies nausea or diarrhea     HOME DIABETES REGIMEN:  Novolog Mix 30 units BID Metformin 1000 mg BID    METER DOWNLOAD SUMMARY: No data on the download for the past 2 weeks     HISTORY:  Past Medical History:  Past Medical History:  Diagnosis Date  . Arthritis    All over  . CAD (coronary artery disease), native coronary artery, minimal by cardiac cath 01/08/19 01/09/2019  . Chronic pain syndrome   . Diabetes mellitus   . History of cocaine use   . History of kidney stones   . Hypercholesteremia   . Hypertension   . Low back  pain   . Lumbar radiculopathy   . Right leg pain   . Seborrheic dermatitis of scalp   . Shingles    Past Surgical History:  Past Surgical History:  Procedure Laterality Date  . CIRCUMCISION N/A 03/16/2018   Procedure: CIRCUMCISION ADULT WITH PENILE BLOCK;  Surgeon: Alexis Frock, MD;  Location: [facility];  Service: Urology;  Laterality: N/A;  17 MINS  . KNEE ARTHROSCOPY Bilateral   . LEFT HEART CATH AND CORONARY ANGIOGRAPHY N/A 01/08/2019   Procedure: LEFT HEART CATH AND CORONARY ANGIOGRAPHY;  Surgeon: Troy Sine, MD;  Location: [facility];  Service: Cardiovascular;  Laterality: N/A;    Social History:  reports that he has been smoking cigarettes. He has a 50.00 pack-year smoking history. He has quit using smokeless tobacco.  His smokeless tobacco use included chew. He reports current alcohol use. He reports current drug use. Drugs: Marijuana and Cocaine. Family History:  Family History  Problem Relation Age of Onset  . Cancer Mother   . Stroke Father   . Heart disease Father   . Diabetes Father      HOME MEDICATIONS: Allergies as of 08/20/2020   No Known Allergies     Medication List       Accurate as of August 20, 2020  7:40 AM. If you have  any questions, ask your nurse or doctor.        STOP taking these medications   benzonatate 100 MG capsule Commonly known as: Best boy Stopped by: Dorita Sciara, MD     TAKE these medications   Accu-Chek Aviva Plus w/Device Kit 1 Device by Does not apply route 3 (three) times daily after meals.   Accu-Chek Guide test strip Generic drug: glucose blood TEST TWICE DAILY AS DIRECTED   accu-chek soft touch lancets Use as instructed to test blood sugar 3 times daily DX E11.42   aspirin 81 MG chewable tablet Chew 1 tablet (81 mg total) by mouth daily.   atorvastatin 40 MG tablet Commonly known as: LIPITOR Take 1 tablet (40 mg total) by mouth daily.   baclofen 10 MG  tablet Commonly known as: LIORESAL TAKE 1 TABLET(10 MG) BY MOUTH TWICE DAILY   BD Pen Needle Nano 2nd Gen 32G X 4 MM Misc Generic drug: Insulin Pen Needle USE TWICE DAILY WITH A MEAL AS DIRECTED   diclofenac 50 MG EC tablet Commonly known as: VOLTAREN TAKE 1 TABLET(50 MG) BY MOUTH TWICE DAILY   gabapentin 300 MG capsule Commonly known as: NEURONTIN TAKE 1 CAPSULE(300 MG) BY MOUTH THREE TIMES DAILY   HYDROcodone-acetaminophen 5-325 MG tablet Commonly known as: NORCO/VICODIN Take 1 tablet by mouth every 6 (six) hours as needed for moderate pain.   lisinopril 2.5 MG tablet Commonly known as: ZESTRIL Take 1 tablet (2.5 mg total) by mouth daily. Kidney protection   loratadine 10 MG tablet Commonly known as: CLARITIN TAKE 1 TABLET(10 MG) BY MOUTH DAILY   metFORMIN 1000 MG tablet Commonly known as: GLUCOPHAGE TAKE 1 TABLET(1000 MG) BY MOUTH TWICE DAILY WITH A MEAL   methocarbamol 500 MG tablet Commonly known as: ROBAXIN Take 1 tablet (500 mg total) by mouth every 8 (eight) hours as needed for muscle spasms.   NovoLOG Mix 70/30 FlexPen (70-30) 100 UNIT/ML FlexPen Generic drug: insulin aspart protamine - aspart Inject 0.28 mLs (28 Units total) into the skin 2 (two) times daily. What changed: how much to take   omega-3 acid ethyl esters 1 g capsule Commonly known as: Lovaza Take 1 capsule (1 g total) by mouth 2 (two) times daily.   predniSONE 20 MG tablet Commonly known as: DELTASONE Take 2 tablets (40 mg total) by mouth daily.        OBJECTIVE:   Vital Signs: BP 118/80   Pulse 66   Ht 6' (1.829 m)   Wt 226 lb 8 oz (102.7 kg)   SpO2 98%   BMI 30.72 kg/m   Wt Readings from Last 3 Encounters:  08/20/20 226 lb 8 oz (102.7 kg)  06/28/20 222 lb 11.2 oz (101 kg)  02/26/20 200 lb (90.7 kg)     Exam: General: Pt appears well and is in NAD  Lungs: Clear with good BS bilat with no rales, rhonchi, or wheezes  Heart: RRR with normal S1 and S2 and no gallops; no  murmurs; no rub  Neuro: MS is good with appropriate affect, pt is alert and Ox3      DM foot exam: 02/13/2020  The skin of the feet is intact without sores or ulcerations. The pedal pulses are 2+ on right and 2+ on left. The sensation is decreased at the right heel to a screening 5.07, 10 gram monofilament    DATA REVIEWED:  Lab Results  Component Value Date   HGBA1C 8.8 (A) 08/20/2020   HGBA1C 7.4 (A)  02/13/2020   HGBA1C 9.8 (H) 09/30/2019   Lab Results  Component Value Date   MICROALBUR 27.6 (H) 02/13/2020   LDLCALC 56 07/23/2019   CREATININE 1.59 (H) 06/28/2020      Lab Results  Component Value Date   CHOL 125 07/23/2019   HDL 29 (L) 07/23/2019   LDLCALC 56 07/23/2019   LDLDIRECT 118.6 (H) 01/08/2019   TRIG 248 (H) 07/23/2019   CHOLHDL 4.3 07/23/2019         ASSESSMENT / PLAN / RECOMMENDATIONS:   1) Type 2  Diabetes Mellitus, poorly control, With Neuropathy and CAD  complications - Most recent A1c of 8.8 %. Goal A1c < 7.5 %.    -Pt with hyperglycemia again  - Will adjust insulin, he has been taking it incorrectly at times without eating, we discussed the importance of taking it with breakfast and supper  - We discussed add-on therapy with SGLT-2 inhibitors. He is a great candidate given prior CAD history , he agreed to this.    Plan: MEDICATIONS:  Increase Novolog Mix to 34 units BID with meals   Metformin 1000 mg BID   Start Farxiga 5 mg, 1 tablet daily   EDUCATION / INSTRUCTIONS:  BG monitoring instructions: Patient is instructed to check his blood sugars 2 times a day, fasting and supper .  Call [facility] if: BG persistently < 70 . I reviewed the Rule of 15 for the treatment of hypoglycemia in detail with the patient. Literature supplied.   2) Hypertriglyceridemia:  His  TG has been as high as 545 mg/dL in 2015 but this has been trending down to mid 200's. If this remains elevated, will consider adjusting Lovaza    Continue  Atorvastatin 40 mg daily  Continue Lovaza 1 gram BID   F/U in 4 months    Signed electronically by: Mack Guise, MD  [facility] Endocrinology  [facility] [facility], [place] [address] Phone: [phone] FAX: [phone]   CC: Charlott Rakes, [place] [address] Phone: [phone]  Fax: [phone]  Return to Endocrinology clinic as below: No future appointments.

## 2020-10-01 ENCOUNTER — Other Ambulatory Visit: Payer: Self-pay

## 2020-10-01 ENCOUNTER — Other Ambulatory Visit: Payer: Self-pay | Admitting: Specialist

## 2020-10-01 NOTE — Telephone Encounter (Signed)
Patient came into the office he is requesting rx refill for hydrocodone and methocarbamol call back:[phone]

## 2020-10-02 MED ORDER — METHOCARBAMOL 500 MG PO TABS: 500.0000 mg | ORAL_TABLET | Freq: Three times a day (TID) | ORAL | 0 refills | Status: DC | PRN

## 2020-10-02 MED ORDER — HYDROCODONE-ACETAMINOPHEN 5-325 MG PO TABS: 1.0000 | ORAL_TABLET | Freq: Four times a day (QID) | ORAL | 0 refills | Status: DC | PRN

## 2020-11-09 ENCOUNTER — Other Ambulatory Visit: Payer: Self-pay | Admitting: Specialist

## 2020-11-09 ENCOUNTER — Other Ambulatory Visit: Payer: Self-pay | Admitting: Internal Medicine

## 2020-11-09 DIAGNOSIS — R809 Proteinuria, unspecified: Secondary | ICD-10-CM

## 2020-11-09 DIAGNOSIS — E1129 Type 2 diabetes mellitus with other diabetic kidney complication: Secondary | ICD-10-CM

## 2020-11-09 MED ORDER — HYDROCODONE-ACETAMINOPHEN 5-325 MG PO TABS: 1.0000 | ORAL_TABLET | Freq: Four times a day (QID) | ORAL | 0 refills | Status: DC | PRN

## 2020-11-09 MED ORDER — METHOCARBAMOL 500 MG PO TABS: 500.0000 mg | ORAL_TABLET | Freq: Three times a day (TID) | ORAL | 0 refills | Status: DC | PRN

## 2020-11-09 NOTE — Telephone Encounter (Signed)
Pt came in asking for a refill of his hydrocodone rx and he also mentioned he believes he's missing his rx that supposed to be for his kidneys, as well as his muscle relaxer.  [phone]

## 2020-12-01 ENCOUNTER — Telehealth: Payer: Self-pay

## 2020-12-01 NOTE — Telephone Encounter (Signed)
Scheduled with Dr. Louanne Skye.

## 2020-12-01 NOTE — Telephone Encounter (Signed)
Patient's last injection was 01/22/2020- bilateral L4 TF. He reported 50% relief when he saw Dr. Louanne Skye on 02/26/20. Per Dr. Otho Ket note, surgery was to be scheduled. It does not look like this was done. Patient seen in ED in December 2021 and follow up with Dr. Louanne Skye was recommended, but patient has not returned to see Dr. Louanne Skye.. Please advise.

## 2020-12-01 NOTE — Telephone Encounter (Signed)
Pt came into the office asking if he can make an appt with Dr. Ernestina Patches

## 2020-12-01 NOTE — Telephone Encounter (Signed)
F/up [facility]

## 2020-12-24 ENCOUNTER — Ambulatory Visit (INDEPENDENT_AMBULATORY_CARE_PROVIDER_SITE_OTHER): Payer: Medicare Other | Admitting: Internal Medicine

## 2020-12-24 ENCOUNTER — Other Ambulatory Visit: Payer: Self-pay

## 2020-12-24 VITALS — BP 140/78 | HR 68 | Wt 224.0 lb

## 2020-12-24 DIAGNOSIS — E1129 Type 2 diabetes mellitus with other diabetic kidney complication: Secondary | ICD-10-CM | POA: Diagnosis not present

## 2020-12-24 DIAGNOSIS — E1142 Type 2 diabetes mellitus with diabetic polyneuropathy: Secondary | ICD-10-CM

## 2020-12-24 DIAGNOSIS — E785 Hyperlipidemia, unspecified: Secondary | ICD-10-CM | POA: Diagnosis not present

## 2020-12-24 DIAGNOSIS — E1159 Type 2 diabetes mellitus with other circulatory complications: Secondary | ICD-10-CM

## 2020-12-24 DIAGNOSIS — Z794 Long term (current) use of insulin: Secondary | ICD-10-CM

## 2020-12-24 DIAGNOSIS — R809 Proteinuria, unspecified: Secondary | ICD-10-CM

## 2020-12-24 DIAGNOSIS — R252 Cramp and spasm: Secondary | ICD-10-CM | POA: Diagnosis not present

## 2020-12-24 DIAGNOSIS — E7800 Pure hypercholesterolemia, unspecified: Secondary | ICD-10-CM | POA: Diagnosis not present

## 2020-12-24 LAB — MICROALBUMIN / CREATININE URINE RATIO
Creatinine,U: 87.3 mg/dL
Microalb Creat Ratio: 13 mg/g (ref 0.0–30.0)
Microalb, Ur: 11.3 mg/dL — ABNORMAL HIGH (ref 0.0–1.9)

## 2020-12-24 LAB — LIPID PANEL
Cholesterol: 196 mg/dL (ref 0–200)
HDL: 35.8 mg/dL — ABNORMAL LOW (ref >39.00)
NonHDL: 159.71
Total CHOL/HDL Ratio: 5
Triglycerides: 318 mg/dL — ABNORMAL HIGH (ref 0.0–149.0)
VLDL: 63.6 mg/dL — ABNORMAL HIGH (ref 0.0–40.0)

## 2020-12-24 LAB — LDL CHOLESTEROL, DIRECT: Direct LDL: 127 mg/dL

## 2020-12-24 LAB — COMPREHENSIVE METABOLIC PANEL
ALT: 25 U/L (ref 0–53)
AST: 19 U/L (ref 0–37)
Albumin: 4.2 g/dL (ref 3.5–5.2)
Alkaline Phosphatase: 56 U/L (ref 39–117)
BUN: 33 mg/dL — ABNORMAL HIGH (ref 6–23)
CO2: 23 mEq/L (ref 19–32)
Calcium: 9.6 mg/dL (ref 8.4–10.5)
Chloride: 105 mEq/L (ref 96–112)
Creatinine, Ser: 0.97 mg/dL (ref 0.40–1.50)
GFR: 82.87 mL/min (ref >60.00)
Glucose, Bld: 119 mg/dL — ABNORMAL HIGH (ref 70–99)
Potassium: 4.6 mEq/L (ref 3.5–5.1)
Sodium: 137 mEq/L (ref 135–145)
Total Bilirubin: 0.3 mg/dL (ref 0.2–1.2)
Total Protein: 6.8 g/dL (ref 6.0–8.3)

## 2020-12-24 LAB — HEMOGLOBIN A1C: Hgb A1c MFr Bld: 7.8 % — ABNORMAL HIGH (ref 4.6–6.5)

## 2020-12-24 MED ORDER — DAPAGLIFLOZIN PROPANEDIOL 5 MG PO TABS: 5.0000 mg | ORAL_TABLET | Freq: Every day | ORAL | 6 refills | Status: DC

## 2020-12-24 NOTE — Patient Instructions (Addendum)
-   Conitnue  Novolog Mix to 32 units with Breakfast and 32 units with Supper - Continue Metformin 1000 mg twice daily  - Start Faxiga 5 mg, 1 tablet daily   Your should also be on Lisinopril 2.5 mg daily and Lipitor 40 mg daily    HOW TO TREAT LOW BLOOD SUGARS (Blood sugar LESS THAN 70 MG/DL) Please follow the RULE OF 15 for the treatment of hypoglycemia treatment (when your (blood sugars are less than 70 mg/dL)   STEP 1: Take 15 grams of carbohydrates when your blood sugar is low, which includes:  3-4 GLUCOSE TABS  OR 3-4 OZ OF JUICE OR REGULAR SODA OR ONE TUBE OF GLUCOSE GEL    STEP 2: RECHECK blood sugar in 15 MINUTES STEP 3: If your blood sugar is still low at the 15 minute recheck --> then, go back to STEP 1 and treat AGAIN with another 15 grams of carbohydrates.

## 2020-12-24 NOTE — Progress Notes (Signed)
Name: Ryan Farmer  Age/ Sex: 65 y.o., male   MRN/ DOB: [id number], 10/28/1955     PCP: Charlott Rakes, MD   Reason for Endocrinology Evaluation: Type 2 Diabetes Mellitus  Initial Endocrine Consultative Visit: 04/15/2019    PATIENT IDENTIFIER: [name] is a 65 y.o. male with a past medical history of CAD, T2DM, Hyperlipidemia and polysubstance abuse. The patient has followed with Endocrinology clinic since 04/15/2019 for consultative assistance with management of his diabetes.  DIABETIC HISTORY:  [name] was diagnosed with DM in 2011. He has been on SU in the past, has been on metformin since diagnosis, insulin started in 2018. His hemoglobin A1c has ranged from 6.4% in 2017, peaking at 12.6% in 2020.  On his initial visit to our clinic he was on Lantus and Metformin with an A1c 10.4% . We stopped lantus and started a novolog Mix.   Receives intra-articular injections periodically.     Lives with sister, on disability ( can not read or write)      Drinks 12-18 packs a few times a month   SUBJECTIVE:   During the last visit (08/20/2020): A1c 8.8 % . Continued metformin and increased Novolog Mix .      Today (12/24/2020): [name] is here for a follow up on diabetes management.   He checks his blood sugars occasionally . The patient has not had hypoglycemic episodes since the last clinic visit.   He received a pack of steroid 06/2020  Denies nausea or diarrhea  Continues with back pain - sees specialist    HOME DIABETES REGIMEN:  Novolog Mix 34 units BID- 32 units BID  Metformin 1000 mg BID Farxiga 5 mg - no taking    METER DOWNLOAD SUMMARY: Unable to download  140-177 mg/dL        DIABETIC COMPLICATIONS: Microvascular complications:  Neuropathy  Denies: CKD , retinopathy  Last eye exam: Completed > 1    Macrovascular complications:  CAD Denies: PVD, CVA      HISTORY:  Past Medical History:  Past Medical History:  Diagnosis Date    Arthritis    All over   CAD (coronary artery disease), native coronary artery, minimal by cardiac cath 01/08/19 01/09/2019   Chronic pain syndrome    Diabetes mellitus    History of cocaine use    History of kidney stones    Hypercholesteremia    Hypertension    Low back pain    Lumbar radiculopathy    Right leg pain    Seborrheic dermatitis of scalp    Shingles    Past Surgical History:  Past Surgical History:  Procedure Laterality Date   CIRCUMCISION N/A 03/16/2018   Procedure: CIRCUMCISION ADULT WITH PENILE BLOCK;  Surgeon: Alexis Frock, MD;  Location: [facility];  Service: Urology;  Laterality: N/A;  76 MINS   KNEE ARTHROSCOPY Bilateral    LEFT HEART CATH AND CORONARY ANGIOGRAPHY N/A 01/08/2019   Procedure: LEFT HEART CATH AND CORONARY ANGIOGRAPHY;  Surgeon: Troy Sine, MD;  Location: [facility];  Service: Cardiovascular;  Laterality: N/A;   Social History:  reports that he has been smoking cigarettes. He has a 50.00 pack-year smoking history. He has quit using smokeless tobacco.  His smokeless tobacco use included chew. He reports current alcohol use. He reports current drug use. Drugs: Marijuana and Cocaine. Family History:  Family History  Problem Relation Age of Onset   Cancer Mother    Stroke  Father    Heart disease Father    Diabetes Father      HOME MEDICATIONS: Allergies as of 12/24/2020   No Known Allergies      Medication List        Accurate as of December 24, 2020  7:50 AM. If you have any questions, ask your nurse or doctor.          Accu-Chek Aviva Plus w/Device Kit 1 Device by Does not apply route 3 (three) times daily after meals.   Accu-Chek Guide test strip Generic drug: glucose blood USE TWICE DAILY AS DIRECTED TO TEST BLOOD SUGAR   accu-chek soft touch lancets Use as instructed to test blood sugar 3 times daily DX E11.42   aspirin 81 MG chewable tablet Chew 1 tablet (81 mg total) by mouth daily.    atorvastatin 40 MG tablet Commonly known as: LIPITOR Take 1 tablet (40 mg total) by mouth daily.   baclofen 10 MG tablet Commonly known as: LIORESAL TAKE 1 TABLET(10 MG) BY MOUTH TWICE DAILY   BD Pen Needle Nano 2nd Gen 32G X 4 MM Misc Generic drug: Insulin Pen Needle USE TWICE DAILY WITH A MEAL AS DIRECTED   dapagliflozin propanediol 5 MG Tabs tablet Commonly known as: Farxiga Take 1 tablet (5 mg total) by mouth daily before breakfast.   diclofenac 50 MG EC tablet Commonly known as: VOLTAREN TAKE 1 TABLET(50 MG) BY MOUTH TWICE DAILY   gabapentin 300 MG capsule Commonly known as: NEURONTIN TAKE 1 CAPSULE(300 MG) BY MOUTH THREE TIMES DAILY   HYDROcodone-acetaminophen 5-325 MG tablet Commonly known as: NORCO/VICODIN Take 1 tablet by mouth every 6 (six) hours as needed for moderate pain.   lisinopril 2.5 MG tablet Commonly known as: ZESTRIL Take 1 tablet (2.5 mg total) by mouth daily. Kidney protection   loratadine 10 MG tablet Commonly known as: CLARITIN TAKE 1 TABLET(10 MG) BY MOUTH DAILY   metFORMIN 1000 MG tablet Commonly known as: GLUCOPHAGE TAKE 1 TABLET(1000 MG) BY MOUTH TWICE DAILY WITH A MEAL   methocarbamol 500 MG tablet Commonly known as: ROBAXIN Take 1 tablet (500 mg total) by mouth every 8 (eight) hours as needed for muscle spasms.   NovoLOG Mix 70/30 FlexPen (70-30) 100 UNIT/ML FlexPen Generic drug: insulin aspart protamine - aspart Inject 0.34 mLs (34 Units total) into the skin 2 (two) times daily.   omega-3 acid ethyl esters 1 g capsule Commonly known as: Lovaza Take 1 capsule (1 g total) by mouth 2 (two) times daily.         OBJECTIVE:   Vital Signs: BP 140/78   Pulse 68   Wt 224 lb (101.6 kg)   SpO2 96%   BMI 30.38 kg/m   Wt Readings from Last 3 Encounters:  12/24/20 224 lb (101.6 kg)  08/20/20 226 lb 8 oz (102.7 kg)  06/28/20 222 lb 11.2 oz (101 kg)     Exam: General: Pt appears well and is in NAD  Lungs: Clear with good BS  bilat with no rales, rhonchi, or wheezes  Heart: RRR with normal S1 and S2 and no gallops; no murmurs; no rub  Neuro: MS is good with appropriate affect, pt is alert and Ox3      DM foot exam: 02/13/2020   The skin of the feet is intact without sores or ulcerations. The pedal pulses are 2+ on right and 2+ on left. The sensation is decreased at the right heel to a screening 5.07, 10 gram monofilament  DATA REVIEWED:  Lab Results  Component Value Date   HGBA1C 8.8 (A) 08/20/2020   HGBA1C 7.4 (A) 02/13/2020   HGBA1C 9.8 (H) 09/30/2019     Results for Ryan Farmer, Ryan Farmer (MRN [id number]) as of 12/25/2020 16:15  Ref. Range 12/24/2020 08:02  Sodium Latest Ref Range: 135 - 145 mEq/L 137  Potassium Latest Ref Range: 3.5 - 5.1 mEq/L 4.6  Chloride Latest Ref Range: 96 - 112 mEq/L 105  CO2 Latest Ref Range: 19 - 32 mEq/L 23  Glucose Latest Ref Range: 70 - 99 mg/dL 119 (H)  BUN Latest Ref Range: 6 - 23 mg/dL 33 (H)  Creatinine Latest Ref Range: 0.40 - 1.50 mg/dL 0.97  Calcium Latest Ref Range: 8.4 - 10.5 mg/dL 9.6  Alkaline Phosphatase Latest Ref Range: 39 - 117 U/L 56  Albumin Latest Ref Range: 3.5 - 5.2 g/dL 4.2  AST Latest Ref Range: 0 - 37 U/L 19  ALT Latest Ref Range: 0 - 53 U/L 25  Total Protein Latest Ref Range: 6.0 - 8.3 g/dL 6.8  Total Bilirubin Latest Ref Range: 0.2 - 1.2 mg/dL 0.3  GFR Latest Ref Range: >60.00 mL/min 82.87  Total CHOL/HDL Ratio Unknown 5  Cholesterol Latest Ref Range: 0 - 200 mg/dL 196  HDL Cholesterol Latest Ref Range: >39.00 mg/dL 35.80 (L)  Direct LDL Latest Units: mg/dL 127.0  MICROALB/CREAT RATIO Latest Ref Range: 0.0 - 30.0 mg/g 13.0  NonHDL Unknown 159.71  Triglycerides Latest Ref Range: 0.0 - 149.0 mg/dL 318.0 (H)  VLDL Latest Ref Range: 0.0 - 40.0 mg/dL 63.6 (H)  Hemoglobin A1C Latest Ref Range: 4.6 - 6.5 % 7.8 (H)  Creatinine,U Latest Units: mg/dL 87.3  Microalb, Ur Latest Ref Range: 0.0 - 1.9 mg/dL 11.3 (H)           ASSESSMENT / PLAN /  RECOMMENDATIONS:   1) Type 2  Diabetes Mellitus, poorly control, With Neuropathy, and CAD  complications - Most recent A1c of 7.8. Goal A1c < 7.5 %.    - I have manually reviewed his meter and BG's seems to be acceptable  - He is not aware of Wilder Glade, that I started last visit, will resend again. We discussed benefits of SGLT-2  inhibitors as well as cautioned against genital infections    MEDICATIONS: Continue Novolog Mix to 32 units BID with meals  Metformin 1000 mg BID  Start Farxiga 5 mg, 1 tablet daily   EDUCATION / INSTRUCTIONS: BG monitoring instructions: Patient is instructed to check his blood sugars 2 times a day, fasting and supper . Call [facility] if: BG persistently < 70 I reviewed the Rule of 15 for the treatment of hypoglycemia in detail with the patient. Literature supplied.  2) Diabetic complications:  Eye: Does not have known diabetic retinopathy. Neuro/ Feet: Does  have known diabetic peripheral neuropathy. Renal: Patient does not have known baseline CKD. He is on an ACEI/ARB at present   3) Hypertriglyceridemia:    His  TG has been as high as 545 mg/dL in 2015 but this has been trending down to mid 200's. He is not on Atorvastatin, he is not sure when/why he stopped taking it.  -Lipid panel today continues to show LDL above goal and elevated triglycerides, patient will be advised to restart his statin therapy, discussed cardiovascular benefits    Continue atorvastatin 40 mg daily   F/U in 4 months    Signed electronically by: Mack Guise, MD  [facility]  [facility] [address] [place], [place] [address] Phone: [phone] FAX: [phone]   CC: Charlott Rakes, MD [place] [address] Phone: [phone]  Fax: [phone]  Return to Endocrinology clinic as below: Future Appointments  Date Time Provider [place]  01/06/2021  9:15 AM Jessy Oto, MD [facility] None

## 2020-12-25 ENCOUNTER — Telehealth: Payer: Self-pay | Admitting: Internal Medicine

## 2020-12-25 ENCOUNTER — Encounter: Payer: Self-pay | Admitting: Internal Medicine

## 2020-12-25 MED ORDER — ATORVASTATIN CALCIUM 40 MG PO TABS
40.0000 mg | ORAL_TABLET | Freq: Every day | ORAL | 3 refills | Status: DC
Start: 2020-12-25 — End: 2021-03-30

## 2020-12-25 NOTE — Telephone Encounter (Signed)
Please let the patient know that his cholesterol is elevated and he needs to make sure that he is taking the atorvastatin (Lipitor) 40 mg daily  Otherwise his urine test and kidney function have all come back normal which is great news    Thank you Abby Nena Jordan, MD  [facility]  [facility] [address] [place], [place] [address] Phone: [phone] FAX: [phone]

## 2020-12-28 NOTE — Telephone Encounter (Signed)
Notified pt regarding lab results and instruction by Dr. Kelton Pillar. Notified pt Rx atovastation sent to the pharmacy 12/25/20.

## 2021-01-01 ENCOUNTER — Telehealth: Payer: Self-pay

## 2021-01-01 NOTE — Telephone Encounter (Signed)
Pt called stating that he has either a yeast infection or a UTI, he cant hold his pee and it is burning in his penis.   He would like Dr. Louanne Skye to call him in a rx for it.  I explained to the pt that Dr. Louanne Skye was a orthopedic doctor and he needs to see his PCP but he insisted that I leave a message for him to call in something.   Please advise he is waiting on a CB

## 2021-01-06 ENCOUNTER — Telehealth: Payer: Self-pay

## 2021-01-06 ENCOUNTER — Ambulatory Visit: Payer: Medicare Other | Admitting: Specialist

## 2021-01-06 NOTE — Telephone Encounter (Signed)
Patient called he had a appointment scheduled this morning but had to cancel due to having car trouble,patient is requesting a call back from [facility] regarding getting injections from [facility] or getting scheduled for surgery call back:[phone]

## 2021-01-22 ENCOUNTER — Other Ambulatory Visit: Payer: Self-pay | Admitting: Specialist

## 2021-01-28 ENCOUNTER — Other Ambulatory Visit: Payer: Self-pay | Admitting: Specialist

## 2021-01-28 NOTE — Telephone Encounter (Signed)
Pt would like to know if he could get a refill on hydrocodone and something for the arthritis in his back.

## 2021-01-28 NOTE — Addendum Note (Signed)
Addended by: Minda Ditto, Alyse Low N on: 01/28/2021 11:22 AM   Modules accepted: Orders

## 2021-01-30 ENCOUNTER — Other Ambulatory Visit: Payer: Self-pay | Admitting: Specialist

## 2021-01-30 MED ORDER — HYDROCODONE-ACETAMINOPHEN 5-325 MG PO TABS: 1.0000 | ORAL_TABLET | Freq: Four times a day (QID) | ORAL | 0 refills | Status: DC | PRN

## 2021-01-30 MED ORDER — BACLOFEN 10 MG PO TABS: ORAL_TABLET | ORAL | 3 refills | Status: DC

## 2021-01-31 ENCOUNTER — Encounter (HOSPITAL_COMMUNITY): Payer: Self-pay | Admitting: Emergency Medicine

## 2021-01-31 ENCOUNTER — Emergency Department (HOSPITAL_COMMUNITY)
Admission: EM | Admit: 2021-01-31 | Discharge: 2021-01-31 | Disposition: A | Discharge status: Home / Self Care | Payer: Medicare Other | Attending: Emergency Medicine | Admitting: Emergency Medicine

## 2021-01-31 DIAGNOSIS — M791 Myalgia, unspecified site: Secondary | ICD-10-CM | POA: Diagnosis not present

## 2021-01-31 DIAGNOSIS — Z5321 Procedure and treatment not carried out due to patient leaving prior to being seen by health care provider: Secondary | ICD-10-CM | POA: Diagnosis not present

## 2021-01-31 DIAGNOSIS — R3981 Functional urinary incontinence: Secondary | ICD-10-CM | POA: Diagnosis not present

## 2021-01-31 DIAGNOSIS — M79604 Pain in right leg: Secondary | ICD-10-CM | POA: Insufficient documentation

## 2021-01-31 DIAGNOSIS — R350 Frequency of micturition: Secondary | ICD-10-CM | POA: Diagnosis not present

## 2021-01-31 DIAGNOSIS — M79605 Pain in left leg: Secondary | ICD-10-CM | POA: Insufficient documentation

## 2021-01-31 LAB — URINALYSIS, ROUTINE W REFLEX MICROSCOPIC
Bilirubin Urine: NEGATIVE
Glucose, UA: 50 mg/dL — AB
Ketones, ur: NEGATIVE mg/dL
Nitrite: NEGATIVE
Protein, ur: 100 mg/dL — AB
Specific Gravity, Urine: 1.029 (ref 1.005–1.030)
WBC, UA: >50 WBC/hpf — ABNORMAL HIGH (ref 0–5)
pH: 5 (ref 5.0–8.0)

## 2021-01-31 NOTE — ED Triage Notes (Signed)
Pt reports pain to buttocks and bilateral legs that has been worse x 1 week.  History of sciatica.  Reports hematuria last week that resolved after taking OTC medication. States he is having frequent urination and did have urinary incontinence last week.

## 2021-01-31 NOTE — ED Notes (Signed)
Pt came up to desk and said "I'm just going to go home, and I'm starting to have chest pain." I encouraged the pt to stay and he refused and I told him he should stay. The pt became aggressive when I asked for his arm band and he ripped his arm band off and threw it on the desk and the pt left.

## 2021-02-02 ENCOUNTER — Other Ambulatory Visit: Payer: Self-pay | Admitting: Specialist

## 2021-02-07 ENCOUNTER — Other Ambulatory Visit: Payer: Self-pay | Admitting: Specialist

## 2021-02-08 NOTE — Telephone Encounter (Signed)
Please advise 

## 2021-02-15 ENCOUNTER — Ambulatory Visit: Payer: Medicare Other | Admitting: Specialist

## 2021-02-26 ENCOUNTER — Other Ambulatory Visit: Payer: Self-pay

## 2021-02-26 ENCOUNTER — Ambulatory Visit: Payer: Self-pay

## 2021-02-26 ENCOUNTER — Ambulatory Visit (INDEPENDENT_AMBULATORY_CARE_PROVIDER_SITE_OTHER): Payer: Medicare Other | Admitting: Specialist

## 2021-02-26 ENCOUNTER — Encounter: Payer: Self-pay | Admitting: Specialist

## 2021-02-26 ENCOUNTER — Ambulatory Visit (INDEPENDENT_AMBULATORY_CARE_PROVIDER_SITE_OTHER): Payer: Medicare Other

## 2021-02-26 VITALS — BP 110/67 | HR 57 | Ht 72.0 in | Wt 224.0 lb

## 2021-02-26 DIAGNOSIS — M5136 Other intervertebral disc degeneration, lumbar region: Secondary | ICD-10-CM

## 2021-02-26 DIAGNOSIS — M48062 Spinal stenosis, lumbar region with neurogenic claudication: Secondary | ICD-10-CM

## 2021-02-26 DIAGNOSIS — M4807 Spinal stenosis, lumbosacral region: Secondary | ICD-10-CM | POA: Diagnosis not present

## 2021-02-26 DIAGNOSIS — M4316 Spondylolisthesis, lumbar region: Secondary | ICD-10-CM | POA: Diagnosis not present

## 2021-02-26 MED ORDER — DICLOFENAC SODIUM 1 % EX GEL: 4.0000 g | Freq: Four times a day (QID) | CUTANEOUS | 4 refills | Status: DC

## 2021-02-26 MED ORDER — HYDROCODONE-ACETAMINOPHEN 7.5-325 MG PO TABS: 1.0000 | ORAL_TABLET | Freq: Four times a day (QID) | ORAL | 0 refills | Status: DC | PRN

## 2021-02-26 NOTE — Patient Instructions (Signed)
Avoid bending, stooping and avoid lifting weights greater than 10 lbs. Avoid prolong standing and walking. Avoid frequent bending and stooping  No lifting greater than 10 lbs. May use ice or moist heat for pain. Weight loss is of benefit. Handicap license is approved. MRI of the lumbar spine is ordered Spondylolisthesis is worsening at L3-4 and there is new retrolisthesis at L4-5.

## 2021-02-26 NOTE — Progress Notes (Addendum)
Office Visit Note   Patient: Ryan Farmer           Date of Birth: May 10, 1956           MRN: [id number] Visit Date: 02/26/2021              Requested by: Charlott Rakes, MD [address] [place],   [address] PCP: Charlott Rakes, MD 03/17/2021 MRI lumbar spine9/10/2020 shows severe lumbar spinal stenosis L3-4 with spondylolisthesis and large central disc herniation. He presented to the ER 9/4/202for evaluation due to increasing leg pain and weakness. Given meds for pain and told that surgery will be scheduled. Avoid bending, stooping and avoid lifting weights greater than 10 lbs. Avoid prolong standing and walking. Order for a new walker with wheels. Surgery scheduling secretary Kandice Hams, will call you in the next week to schedule for surgery.  Surgery recommended is a one level lumbar fusion L3-4 this would be done with rods, screws and cages with local bone graft and allograft (donor bone graft). Take hydrocodone for for pain. Risk of surgery includes risk of infection 1 in 100 patients with diabetes, bleeding 1/2% chance you would need a transfusion.   Risk to the nerves is one in 10,000. You will need to use a brace for 3 months and wean from the brace on the 4th month. Expect improved walking and standing tolerance. Expect relief of leg pain but numbness may persist depending on the length and degree of pressure that has been present.    Assessment & Plan: Visit Diagnoses:  1. Spondylolisthesis, lumbar region   2. Spinal stenosis of lumbar region with neurogenic claudication   3. Degenerative disc disease, lumbar   4. Spinal stenosis of lumbosacral region   5. Spondylolisthesis of lumbar region   65 year old male with radiographs documenting a progressive spondylolisthesis at L3-4 and L4-5. He is experiencing increasing symptoms of neurogenic claudication and also discogenic pain. These are likely due to increase disc degeneration and increased slip and  consequently worsening nerve compression at the sites of spondylolisthesis. Will continue gabapentin and provide medication for pain control and obtain MRI. His plain radiographs show definite progression of the spondylolisthesis at both L3-4 and L4-5.   Plan: Avoid bending, stooping and avoid lifting weights greater than 10 lbs. Avoid prolong standing and walking. Avoid frequent bending and stooping  No lifting greater than 10 lbs. May use ice or moist heat for pain. Weight loss is of benefit. Handicap license is approved. MRI of the lumbar spine is ordered Spondylolisthesis is worsening at L3-4 and there is new retrolisthesis at L4-5.   Follow-Up Instructions: Return in about 4 weeks (around 03/26/2021).   Orders:  Orders Placed This Encounter  Procedures   XR Lumbar Spine Complete W/Bend   No orders of the defined types were placed in this encounter.     Procedures: No procedures performed   Clinical Data: No additional findings.   Subjective: Chief Complaint  Patient presents with   Lower Back - Pain    65 year old male with history of mild spondylolisthesis at L3-4 and mild spinal stenosis L3-4 and L4-5. He has increasing back pain and radiation into both thighs and legs laterally. Pain is worse with prolong standing and walking. Pain is becoming severe with bending and stooping and now pain with cough and sneeze radiating down the backs and sides of the legs. No bowel or bladder discomfort. Previous radiographs and myelogram and post myelogram CT  scan has show degenerative spondylolisthesis L3-4 with mild lateral recess stenosis L3-4 and L4-5.  He report pain is now severe and he is stooping and leaning on his furniture at home to get around and on the grocery cart when shopping. There is a history of polysubstance abuse in the past.   Review of Systems  Constitutional: Negative.   HENT: Negative.    Eyes: Negative.   Respiratory: Negative.    Cardiovascular:  Negative.   Gastrointestinal: Negative.   Endocrine: Negative.   Genitourinary: Negative.   Musculoskeletal: Negative.   Skin: Negative.   Allergic/Immunologic: Negative.   Neurological: Negative.   Hematological: Negative.   Psychiatric/Behavioral: Negative.      Objective: Vital Signs: BP 110/67   Pulse (!) 57   Ht 6' (1.829 m)   Wt 224 lb (101.6 kg)   BMI 30.38 kg/m   Physical Exam Constitutional:      Appearance: He is well-developed.  HENT:     Head: Normocephalic and atraumatic.  Eyes:     Pupils: Pupils are equal, round, and reactive to light.  Pulmonary:     Effort: Pulmonary effort is normal.     Breath sounds: Normal breath sounds.  Abdominal:     General: Bowel sounds are normal.     Palpations: Abdomen is soft.  Musculoskeletal:     Cervical back: Normal range of motion and neck supple.  Skin:    General: Skin is warm and dry.  Neurological:     Mental Status: He is alert and oriented to person, place, and time.  Psychiatric:        Behavior: Behavior normal.        Thought Content: Thought content normal.        Judgment: Judgment normal.   Back Exam   Tenderness  The patient is experiencing tenderness in the lumbar.  Range of Motion  Extension:  abnormal  Flexion:  abnormal  Lateral bend right:  abnormal  Lateral bend left:  abnormal  Rotation right:  abnormal  Rotation left:  abnormal   Muscle Strength  Right Quadriceps:  4/5  Left Quadriceps:  4/5  Right Hamstrings:  5/5  Left Hamstrings:  5/5   Reflexes  Patellar:  0/4 Achilles:  2/4  Other  Toe walk: abnormal Heel walk: abnormal Sensation: decreased Gait: antalgic   Comments:  Pain with hyperextension of the lumbar spine. There is bilateral knee extension strength and foot DF strength that is normal.    Specialty Comments:  No specialty comments available.  Imaging: XR Lumbar Spine Complete W/Bend  Result Date: 02/26/2021 AP and lateral flexion and extension  radiographs demonstrate a grade 1-2 spondylolisthesis at L3-4, with grade one Retrolisthesis at L4-5. There is narrowing of the neuroforamen at L4-5 and compared with radiographs from 2020 the retrolisthesis at L4-5 is new and the anterolisthesis at L3-4 is worsened to nearly grade 2.     PMFS History: Patient Active Problem List   Diagnosis Date Noted   Type 2 diabetes mellitus with hyperglycemia, with long-term current use of insulin ([facility]) 08/20/2020   Type 2 diabetes mellitus with microalbuminuria, with long-term current use of insulin ([facility]) 10/14/2019   Type 2 diabetes mellitus with diabetic polyneuropathy, with long-term current use of insulin ([facility]) 04/15/2019   Hypertriglyceridemia 04/15/2019   Spinal stenosis of lumbar region with neurogenic claudication 03/07/2019   Lumbar radiculopathy 03/07/2019   Chronic bilateral low back pain with bilateral sciatica 03/07/2019   CAD (coronary artery disease), native  coronary artery, minimal by cardiac cath 01/08/19 01/09/2019   Syncope and collapse    Spondylosis without myelopathy or radiculopathy, lumbar region 04/26/2018   Cocaine abuse ([facility]) 12/19/2016   Shingles outbreak 12/13/2016   Diabetes type 2, uncontrolled ([facility]) 04/19/2016   Metatarsalgia 10/19/2015   Back muscle spasm 11/18/2013   Hyperlipidemia 11/18/2013   HTN (hypertension) 11/22/2012   Chronic pain syndrome 11/22/2012   Tobacco use disorder 11/22/2012   Dental caries 11/22/2012   Past Medical History:  Diagnosis Date   Arthritis    All over   CAD (coronary artery disease), native coronary artery, minimal by cardiac cath 01/08/19 01/09/2019   Chronic pain syndrome    Diabetes mellitus    History of cocaine use    History of kidney stones    Hypercholesteremia    Hypertension    Low back pain    Lumbar radiculopathy    Right leg pain    Seborrheic dermatitis of scalp    Shingles     Family History  Problem Relation Age of Onset   Cancer Mother    Stroke Father     Heart disease Father    Diabetes Father     Past Surgical History:  Procedure Laterality Date   CIRCUMCISION N/A 03/16/2018   Procedure: CIRCUMCISION ADULT WITH PENILE BLOCK;  Surgeon: Alexis Frock, MD;  Location: [facility];  Service: Urology;  Laterality: N/A;  23 MINS   KNEE ARTHROSCOPY Bilateral    LEFT HEART CATH AND CORONARY ANGIOGRAPHY N/A 01/08/2019   Procedure: LEFT HEART CATH AND CORONARY ANGIOGRAPHY;  Surgeon: Troy Sine, MD;  Location: [facility];  Service: Cardiovascular;  Laterality: N/A;   Social History   Occupational History   Not on file  Tobacco Use   Smoking status: Every Day    Packs/day: 1.00    Years: 50.00    Pack years: 50.00    Types: Cigarettes   Smokeless tobacco: Former    Types: Nurse, children's Use: Never used  Substance and Sexual Activity   Alcohol use: Yes    Comment: occ   Drug use: Yes    Types: Marijuana, Cocaine    Comment: twice a week   Sexual activity: Not on file

## 2021-03-14 ENCOUNTER — Encounter (HOSPITAL_COMMUNITY): Payer: Self-pay | Admitting: Emergency Medicine

## 2021-03-14 ENCOUNTER — Emergency Department (HOSPITAL_COMMUNITY)
Admission: EM | Admit: 2021-03-14 | Discharge: 2021-03-14 | Disposition: A | Discharge status: Home / Self Care | Payer: Medicare Other | Attending: Emergency Medicine | Admitting: Emergency Medicine

## 2021-03-14 ENCOUNTER — Ambulatory Visit
Admission: RE | Admit: 2021-03-14 | Discharge: 2021-03-14 | Disposition: A | Discharge status: Home / Self Care | Payer: Medicare Other | Source: Ambulatory Visit | Attending: Specialist | Admitting: Specialist

## 2021-03-14 ENCOUNTER — Other Ambulatory Visit: Payer: Self-pay

## 2021-03-14 ENCOUNTER — Emergency Department (HOSPITAL_COMMUNITY): Payer: Medicare Other

## 2021-03-14 DIAGNOSIS — M4316 Spondylolisthesis, lumbar region: Secondary | ICD-10-CM

## 2021-03-14 DIAGNOSIS — F17210 Nicotine dependence, cigarettes, uncomplicated: Secondary | ICD-10-CM | POA: Diagnosis not present

## 2021-03-14 DIAGNOSIS — Z20822 Contact with and (suspected) exposure to covid-19: Secondary | ICD-10-CM | POA: Diagnosis not present

## 2021-03-14 DIAGNOSIS — M545 Low back pain, unspecified: Secondary | ICD-10-CM | POA: Diagnosis not present

## 2021-03-14 DIAGNOSIS — Z79899 Other long term (current) drug therapy: Secondary | ICD-10-CM | POA: Diagnosis not present

## 2021-03-14 DIAGNOSIS — Z7982 Long term (current) use of aspirin: Secondary | ICD-10-CM | POA: Diagnosis not present

## 2021-03-14 DIAGNOSIS — E1142 Type 2 diabetes mellitus with diabetic polyneuropathy: Secondary | ICD-10-CM | POA: Insufficient documentation

## 2021-03-14 DIAGNOSIS — Z7984 Long term (current) use of oral hypoglycemic drugs: Secondary | ICD-10-CM | POA: Insufficient documentation

## 2021-03-14 DIAGNOSIS — G8929 Other chronic pain: Secondary | ICD-10-CM | POA: Diagnosis not present

## 2021-03-14 DIAGNOSIS — M4807 Spinal stenosis, lumbosacral region: Secondary | ICD-10-CM

## 2021-03-14 DIAGNOSIS — Z794 Long term (current) use of insulin: Secondary | ICD-10-CM | POA: Insufficient documentation

## 2021-03-14 DIAGNOSIS — M5441 Lumbago with sciatica, right side: Secondary | ICD-10-CM | POA: Diagnosis not present

## 2021-03-14 DIAGNOSIS — M5442 Lumbago with sciatica, left side: Secondary | ICD-10-CM | POA: Diagnosis not present

## 2021-03-14 DIAGNOSIS — M48061 Spinal stenosis, lumbar region without neurogenic claudication: Secondary | ICD-10-CM | POA: Diagnosis not present

## 2021-03-14 DIAGNOSIS — I2510 Atherosclerotic heart disease of native coronary artery without angina pectoris: Secondary | ICD-10-CM | POA: Insufficient documentation

## 2021-03-14 DIAGNOSIS — R079 Chest pain, unspecified: Secondary | ICD-10-CM | POA: Diagnosis not present

## 2021-03-14 LAB — CBC
HCT: 52.8 % — ABNORMAL HIGH (ref 39.0–52.0)
Hemoglobin: 16.7 g/dL (ref 13.0–17.0)
MCH: 29.1 pg (ref 26.0–34.0)
MCHC: 31.6 g/dL (ref 30.0–36.0)
MCV: 92 fL (ref 80.0–100.0)
Platelets: 192 10*3/uL (ref 150–400)
RBC: 5.74 MIL/uL (ref 4.22–5.81)
RDW: 13.8 % (ref 11.5–15.5)
WBC: 8.1 10*3/uL (ref 4.0–10.5)
nRBC: 0 % (ref 0.0–0.2)

## 2021-03-14 LAB — RESP PANEL BY RT-PCR (FLU A&B, COVID) ARPGX2
Influenza A by PCR: NEGATIVE
Influenza B by PCR: NEGATIVE
SARS Coronavirus 2 by RT PCR: NEGATIVE

## 2021-03-14 LAB — TROPONIN I (HIGH SENSITIVITY)
Troponin I (High Sensitivity): 5 ng/L (ref <18)
Troponin I (High Sensitivity): 6 ng/L (ref <18)

## 2021-03-14 LAB — URINALYSIS, ROUTINE W REFLEX MICROSCOPIC
Bilirubin Urine: NEGATIVE
Glucose, UA: NEGATIVE mg/dL
Ketones, ur: NEGATIVE mg/dL
Nitrite: NEGATIVE
Protein, ur: 100 mg/dL — AB
RBC / HPF: >50 RBC/hpf — ABNORMAL HIGH (ref 0–5)
Specific Gravity, Urine: 1.032 — ABNORMAL HIGH (ref 1.005–1.030)
WBC, UA: >50 WBC/hpf — ABNORMAL HIGH (ref 0–5)
pH: 5 (ref 5.0–8.0)

## 2021-03-14 LAB — BASIC METABOLIC PANEL
Anion gap: 9 (ref 5–15)
BUN: 32 mg/dL — ABNORMAL HIGH (ref 8–23)
CO2: 20 mmol/L — ABNORMAL LOW (ref 22–32)
Calcium: 9.8 mg/dL (ref 8.9–10.3)
Chloride: 108 mmol/L (ref 98–111)
Creatinine, Ser: 1.07 mg/dL (ref 0.61–1.24)
GFR, Estimated: >60 mL/min (ref >60)
Glucose, Bld: 106 mg/dL — ABNORMAL HIGH (ref 70–99)
Potassium: 4.5 mmol/L (ref 3.5–5.1)
Sodium: 137 mmol/L (ref 135–145)

## 2021-03-14 MED ORDER — HYDROMORPHONE HCL 1 MG/ML IJ SOLN
1.0000 mg | Freq: Once | INTRAMUSCULAR | Status: AC
  Administered 2021-03-14: 1 mg via INTRAVENOUS
  Filled 2021-03-14: qty 1

## 2021-03-14 MED ORDER — OXYCODONE-ACETAMINOPHEN 5-325 MG PO TABS: 1.0000 | ORAL_TABLET | Freq: Four times a day (QID) | ORAL | 0 refills | Status: DC | PRN

## 2021-03-14 NOTE — Progress Notes (Signed)
Call from [facility] ER 03/14/2021 patient presented with worsening  Sciatica, neurogenic claudication symptoms worsening. MRI was done and it shows progression of spine stenosis from mild to severe. His radiographs show progression of spondylolisthesis so he is a candidate for surgical decompression and fusion surgery. He should be taking medication for pain and a neuromembrane stabilizer like gabapentin ramp up to '300mg'$ TID. I will have office contact him to schedule surgery.

## 2021-03-14 NOTE — Discharge Instructions (Addendum)
Call your primary care doctor or specialist as discussed in the next 2-3 days.   Return immediately back to the ER if:  Your symptoms worsen within the next 12-24 hours. You develop new symptoms such as new fevers, persistent vomiting, new pain, shortness of breath, or new weakness or numbness, or if you have any other concerns.  

## 2021-03-14 NOTE — ED Provider Notes (Addendum)
Emergency Medicine Provider Triage Evaluation Note  Ryan Farmer , a 65 y.o. male  was evaluated in triage.  Pt complains of bilateral lower extremity " sciatic type" pain after laying in MRI machine for evaluation known lumbar radiculopathy.  Denies bowel or bladder incontinence saddle anesthesia.  Complaints of chest pain that is recurrent.  States it happened last night sitting down watching TV and again a few weeks ago.  Review of Systems  Positive: Pain in BLE, lumbar back pain, chest pain Negative: Bowel or bladder incontinence, falls, syncope, palpitations, shortness of breath   Physical Exam  BP (!) 161/89 (BP Location: Left Arm)   Pulse 81   Temp 98.2 F (36.8 C) (Oral)   Resp 16   SpO2 96%  Gen:   Awake, no distress, alert and oriented  Resp:  Normal effort  MSK:   Moves extremities without difficulty  Medical Decision Making  Medically screening exam initiated at 8:06 AM.  Appropriate orders placed.  JACHAI KENNER was informed that the remainder of the evaluation will be completed by another provider, this initial triage assessment does not replace that evaluation, and the importance of remaining in the ED until their evaluation is complete.     Mickie Hillier, PA-C 03/14/21 0813    Mickie Hillier, PA-C 03/14/21 [id number]    Luna Fuse, MD 03/14/21 1125

## 2021-03-14 NOTE — ED Triage Notes (Signed)
Pt reports "sciatica".  States he has lower back pain that radiates down bilateral legs.  Had MRI this morning and states they had to do it twice because he was unable to lay still.  Also reports L sided chest pain last night and a few weeks ago.  Denies chest pain at present.  Reports burning with urination and frequent urination.  States he has been drinking cranberry juice.  Reports hematuria a few weeks ago that resolved.

## 2021-03-14 NOTE — ED Provider Notes (Signed)
[facility] EMERGENCY DEPARTMENT Provider Note   CSN: [id number] Arrival date & time: 03/14/21  0800     History Chief Complaint  Patient presents with   Chest Pain   Back Pain    Ryan Farmer is a 65 y.o. male.  Patient presents with lower back pain.  He states it been going on for several years worse in the last few weeks.  He is followed closely by orthopedic spine surgeon who sent him for an outpatient MRI which was performed this morning.  He states he was able to finish the study but has significant pain from lying flat and came into the ER for assistance.  Otherwise denies any fevers or cough no vomiting or diarrhea.  No new numbness or weakness.  No bowel or bladder dysfunction.  He states he had a recent urinary tract infection but otherwise has not lost control of his bowels or bladder function.      Past Medical History:  Diagnosis Date   Arthritis    All over   CAD (coronary artery disease), native coronary artery, minimal by cardiac cath 01/08/19 01/09/2019   Chronic pain syndrome    Diabetes mellitus    History of cocaine use    History of kidney stones    Hypercholesteremia    Hypertension    Low back pain    Lumbar radiculopathy    Right leg pain    Seborrheic dermatitis of scalp    Shingles     Patient Active Problem List   Diagnosis Date Noted   Type 2 diabetes mellitus with hyperglycemia, with long-term current use of insulin ([place]) 08/20/2020   Type 2 diabetes mellitus with microalbuminuria, with long-term current use of insulin ([place]) 10/14/2019   Type 2 diabetes mellitus with diabetic polyneuropathy, with long-term current use of insulin ([place]) 04/15/2019   Hypertriglyceridemia 04/15/2019   Spinal stenosis of lumbar region with neurogenic claudication 03/07/2019   Lumbar radiculopathy 03/07/2019   Chronic bilateral low back pain with bilateral sciatica 03/07/2019   CAD (coronary artery disease), native coronary artery, minimal by  cardiac cath 01/08/19 01/09/2019   Syncope and collapse    Spondylosis without myelopathy or radiculopathy, lumbar region 04/26/2018   Cocaine abuse ([place]) 12/19/2016   Shingles outbreak 12/13/2016   Diabetes type 2, uncontrolled ([place]) 04/19/2016   Metatarsalgia 10/19/2015   Back muscle spasm 11/18/2013   Hyperlipidemia 11/18/2013   HTN (hypertension) 11/22/2012   Chronic pain syndrome 11/22/2012   Tobacco use disorder 11/22/2012   Dental caries 11/22/2012    Past Surgical History:  Procedure Laterality Date   CIRCUMCISION N/A 03/16/2018   Procedure: CIRCUMCISION ADULT WITH PENILE BLOCK;  Surgeon: Alexis Frock, MD;  Location: [facility];  Service: Urology;  Laterality: N/A;  35 MINS   KNEE ARTHROSCOPY Bilateral    LEFT HEART CATH AND CORONARY ANGIOGRAPHY N/A 01/08/2019   Procedure: LEFT HEART CATH AND CORONARY ANGIOGRAPHY;  Surgeon: Troy Sine, MD;  Location: [facility];  Service: Cardiovascular;  Laterality: N/A;       Family History  Problem Relation Age of Onset   Cancer Mother    Stroke Father    Heart disease Father    Diabetes Father     Social History   Tobacco Use   Smoking status: Every Day    Packs/day: 1.00    Years: 50.00    Pack years: 50.00    Types: Cigarettes   Smokeless tobacco: Former  Types: Chew  Vaping Use   Vaping Use: Never used  Substance Use Topics   Alcohol use: Yes    Comment: occ   Drug use: Yes    Types: Marijuana, Cocaine    Comment: twice a week    Home Medications Prior to Admission medications   Medication Sig Start Date End Date Taking? Authorizing Provider  oxyCODONE-acetaminophen (PERCOCET/ROXICET) 5-325 MG tablet Take 1 tablet by mouth every 6 (six) hours as needed for up to 12 doses for severe pain. 03/14/21  Yes Luna Fuse, MD  aspirin 81 MG chewable tablet Chew 1 tablet (81 mg total) by mouth daily. 01/10/19   Isaiah Serge, NP  atorvastatin (LIPITOR) 40 MG tablet Take 1 tablet (40 mg  total) by mouth daily. 12/25/20   Shamleffer, Melanie Crazier, MD  baclofen (LIORESAL) 10 MG tablet TAKE 1 TABLET(10 MG) BY MOUTH TWICE DAILY 01/30/21   Jessy Oto, MD  BD PEN NEEDLE NANO 2ND GEN 32G X 4 MM MISC USE TWICE DAILY WITH A MEAL AS DIRECTED 06/29/20   Shamleffer, Melanie Crazier, MD  Blood Glucose Monitoring Suppl (ACCU-CHEK AVIVA PLUS) w/Device KIT 1 Device by Does not apply route 3 (three) times daily after meals. 12/13/16   Boykin Nearing, MD  dapagliflozin propanediol (FARXIGA) 5 MG TABS tablet Take 1 tablet (5 mg total) by mouth daily before breakfast. 12/24/20   Shamleffer, Melanie Crazier, MD  diclofenac (VOLTAREN) 50 MG EC tablet TAKE 1 TABLET(50 MG) BY MOUTH TWICE DAILY 02/08/21   Jessy Oto, MD  diclofenac Sodium (VOLTAREN) 1 % GEL Apply 4 g topically 4 (four) times daily. 02/26/21   Jessy Oto, MD  gabapentin (NEURONTIN) 300 MG capsule TAKE 1 CAPSULE(300 MG) BY MOUTH THREE TIMES DAILY 10/01/20   Jessy Oto, MD  glucose blood (ACCU-CHEK GUIDE) test strip USE TWICE DAILY AS DIRECTED TO TEST BLOOD SUGAR 11/09/20   Shamleffer, Melanie Crazier, MD  HYDROcodone-acetaminophen (NORCO) 7.5-325 MG tablet Take 1 tablet by mouth every 6 (six) hours as needed for moderate pain. 02/26/21   Jessy Oto, MD  HYDROcodone-acetaminophen (NORCO/VICODIN) 5-325 MG tablet Take 1 tablet by mouth every 6 (six) hours as needed for moderate pain. 01/30/21   Jessy Oto, MD  insulin aspart protamine - aspart (NOVOLOG MIX 70/30 FLEXPEN) (70-30) 100 UNIT/ML FlexPen Inject 0.34 mLs (34 Units total) into the skin 2 (two) times daily. 08/20/20   Shamleffer, Melanie Crazier, MD  Lancets (ACCU-CHEK SOFT TOUCH) lancets Use as instructed to test blood sugar 3 times daily DX E11.42 04/17/19   Shamleffer, Melanie Crazier, MD  lisinopril (ZESTRIL) 2.5 MG tablet Take 1 tablet (2.5 mg total) by mouth daily. Kidney protection 01/16/19   Lendon Colonel, NP  loratadine (CLARITIN) 10 MG tablet TAKE 1 TABLET(10 MG)  BY MOUTH DAILY 03/31/20   Almyra Deforest, PA  metFORMIN (GLUCOPHAGE) 1000 MG tablet TAKE 1 TABLET(1000 MG) BY MOUTH TWICE DAILY WITH A MEAL 11/09/20   Shamleffer, Melanie Crazier, MD  methocarbamol (ROBAXIN) 500 MG tablet Take 1 tablet (500 mg total) by mouth every 8 (eight) hours as needed for muscle spasms. 11/09/20   Jessy Oto, MD  omega-3 acid ethyl esters (LOVAZA) 1 g capsule Take 1 capsule (1 g total) by mouth 2 (two) times daily. 08/30/19   Almyra Deforest, [place]    Allergies    Patient has no known allergies.  Review of Systems   Review of Systems  Constitutional:  Negative for fever.  HENT:  Negative for ear pain and sore throat.   Eyes:  Negative for pain.  Respiratory:  Negative for cough.   Cardiovascular:  Negative for chest pain.  Gastrointestinal:  Negative for abdominal pain.  Genitourinary:  Negative for flank pain.  Musculoskeletal:  Positive for back pain.  Skin:  Negative for color change and rash.  Neurological:  Negative for syncope.  All other systems reviewed and are negative.  Physical Exam Updated Vital Signs BP 126/65   Pulse (!) 56   Temp 98.2 F (36.8 C) (Oral)   Resp 13   Ht 6' (1.829 m)   Wt 99.8 kg   SpO2 94%   BMI 29.84 kg/m   Physical Exam Constitutional:      Appearance: He is well-developed.  HENT:     Head: Normocephalic.     Nose: Nose normal.  Eyes:     Extraocular Movements: Extraocular movements intact.  Cardiovascular:     Rate and Rhythm: Normal rate.  Pulmonary:     Effort: Pulmonary effort is normal.  Musculoskeletal:     Comments: Positive bilateral straight leg test.  Otherwise strength is 5/5 all extremities.  Patient able to transition from standing to sitting to laying down in the bed without assistance.  He is ambulatory here with a moderately antalgic gait but able to weight-bear and walk.  Skin:    Coloration: Skin is not jaundiced.  Neurological:     General: No focal deficit present.     Mental Status: He is alert and  oriented to person, place, and time. Mental status is at baseline.     Cranial Nerves: No cranial nerve deficit.     Motor: No weakness.     Comments: No saddle anesthesia noted.  Rectal tone is normal.    ED Results / Procedures / Treatments   Labs (all labs ordered are listed, but only abnormal results are displayed) Labs Reviewed  BASIC METABOLIC PANEL - Abnormal; Notable for the following components:      Result Value   CO2 20 (*)    Glucose, Bld 106 (*)    BUN 32 (*)    All other components within normal limits  CBC - Abnormal; Notable for the following components:   HCT 52.8 (*)    All other components within normal limits  URINALYSIS, ROUTINE W REFLEX MICROSCOPIC - Abnormal; Notable for the following components:   APPearance CLOUDY (*)    Specific Gravity, Urine 1.032 (*)    Hgb urine dipstick MODERATE (*)    Protein, ur 100 (*)    Leukocytes,Ua LARGE (*)    RBC / HPF >50 (*)    WBC, UA >50 (*)    Bacteria, UA FEW (*)    Non Squamous Epithelial 0-5 (*)    All other components within normal limits  RESP PANEL BY RT-PCR (FLU A&B, COVID) ARPGX2  TROPONIN I (HIGH SENSITIVITY)  TROPONIN I (HIGH SENSITIVITY)    EKG EKG Interpretation  Date/Time:  Sunday March 14 2021 08:08:18 EDT Ventricular Rate:  81 PR Interval:  180 QRS Duration: 88 QT Interval:  350 QTC Calculation: 406 R Axis:   42 Text Interpretation: Normal sinus rhythm Normal ECG Confirmed by Thamas Jaegers ([id number]) on 03/14/2021 9:07:52 AM  Radiology DG Chest 2 View  Result Date: 03/14/2021 CLINICAL DATA:  Chest and back pain for several days, current smoker EXAM: CHEST - 2 VIEW COMPARISON:  06/28/2020 chest radiograph. FINDINGS: Stable cardiomediastinal silhouette with normal heart size. No pneumothorax. No  pleural effusion. Lungs appear clear, with no acute consolidative airspace disease and no pulmonary edema. IMPRESSION: No active cardiopulmonary disease. Electronically Signed   By: Ilona Sorrel M.D.   On:  03/14/2021 08:48   MR Lumbar Spine w/o contrast  Result Date: 03/14/2021 CLINICAL DATA:  Chronic back pain with extreme bilateral leg and ankle pain for 2.5 years. Bladder changes. No previous relevant surgery. EXAM: MRI LUMBAR SPINE WITHOUT CONTRAST TECHNIQUE: Multiplanar, multisequence MR imaging of the lumbar spine was performed. No intravenous contrast was administered. COMPARISON:  Lumbar MRI 01/04/2019. Abdominal CT 06/28/2020 and lumbar spine radiographs 02/26/2021. FINDINGS: Despite efforts by the technologist and patient, mild motion artifact is present on today's exam and could not be eliminated. This reduces exam sensitivity and specificity. Segmentation: Conventional anatomy assumed, with the last open disc space designated L5-S1.In correlation with interval CTs, there is transitional lumbosacral anatomy, and there are no visible ribs at what is labeled T12. The numbering for this examination is in keeping with the numbering applied to the previous lumbar MRI. Alignment: Interval development of 7 mm of degenerative anterolisthesis at L3-4. Otherwise normal. Vertebrae: Chronic unilateral pars defect on the right at L5 without marrow edema or displacement. No evidence of acute fracture or focal osseous lesion. The visualized sacroiliac joints appear unremarkable. Conus medullaris: Extends to the L1 level and appears normal. Paraspinal and other soft tissues: No significant paraspinal findings. Disc levels: T12-L1: Mild disc bulging with anterior osteophytes. No spinal stenosis or nerve root encroachment. L1-2: Mild disc bulging and osteophyte formation. Mild bilateral facet hypertrophy. No spinal stenosis or nerve root encroachment. L2-3: Disc height is relatively preserved. There is annular disc bulging which is mildly eccentric to the right. There is mild facet and ligamentous hypertrophy. Stable mild narrowing of the lateral recesses and foramina without nerve root encroachment. L3-4: As above,  interval development of a grade 1 degenerative anterolisthesis with progressive disc degeneration and loss of disc height. There is a new large central disc extrusion with slight cranial up turning and significant mass effect on the thecal sac. There is moderate facet and ligamentous hypertrophy with a small right-sided facet joint effusion. These factors contribute to severe multifactorial spinal stenosis with marked narrowing of both lateral recesses and moderate right-greater-than-left foraminal narrowing. L4-5: Stable loss of disc height with annular disc bulging. The previously demonstrated small disc protrusion in the left subarticular zone has partially involuted. Mild facet and ligamentous hypertrophy. Mild narrowing of the lateral recesses and foramina bilaterally without definite residual nerve root encroachment. L5-S1: Stable chronic broad-based left paracentral disc protrusion. No S1 nerve root displacement. Stable mild bilateral facet hypertrophy. IMPRESSION: 1. Compared with the previous MRI from 2020, a degenerative anterolisthesis has developed at L3-4 associated with progressive disc degeneration and a large central disc extrusion causing severe spinal stenosis. There is probable encroachment on both L4 nerve roots in the canal as well as moderate right-greater-than-left foraminal narrowing. 2. A previously demonstrated small disc protrusion in the left subarticular zone at L4-5 has partially involuted, without residual nerve root encroachment. 3. The additional disc space levels appear stable. No acute osseous findings. Electronically Signed   By: Richardean Sale M.D.   On: 03/14/2021 09:37    Procedures Procedures   Medications Ordered in ED Medications  HYDROmorphone (DILAUDID) injection 1 mg (1 mg Intravenous Given 03/14/21 0946)    ED Course  I have reviewed the triage vital signs and the nursing notes.  Pertinent labs & imaging results that were available during  my care of the  patient were reviewed by me and considered in my medical decision making (see chart for details).    MDM Rules/Calculators/A&P                           Review of outside MRI done.  This shows worsening spinal stenosis now severe.  Case discussed with orthopedic surgeon Dr.Nitka review of imaging.  Recommending continued outpatient follow-up with expedited outpatient surgery.  Patient given Dilaudid here is ambulatory with moderate pain no focal neurodeficit noted.  Advised immediate return for new numbness weakness or additional concerns otherwise advised follow-up with orthopedic surgery for surgical intervention within the week.  Patient expressed understanding discharged home stable condition.  Final Clinical Impression(s) / ED Diagnoses Final diagnoses:  Chronic midline low back pain with sciatica, sciatica laterality unspecified    Rx / DC Orders ED Discharge Orders          Ordered    oxyCODONE-acetaminophen (PERCOCET/ROXICET) 5-325 MG tablet  Every 6 hours PRN        03/14/21 1129             Luna Fuse, MD 03/14/21 1129

## 2021-03-18 ENCOUNTER — Other Ambulatory Visit: Payer: Self-pay | Admitting: Specialist

## 2021-03-18 NOTE — Telephone Encounter (Signed)
Pt would like a refill on hydrocodone.

## 2021-03-18 NOTE — Addendum Note (Signed)
Addended by: Minda Ditto, Alyse Low N on: 03/18/2021 10:29 AM   Modules accepted: Orders

## 2021-03-19 ENCOUNTER — Other Ambulatory Visit: Payer: Self-pay | Admitting: Specialist

## 2021-03-19 ENCOUNTER — Telehealth: Payer: Self-pay | Admitting: Specialist

## 2021-03-19 MED ORDER — OXYCODONE-ACETAMINOPHEN 5-325 MG PO TABS: 1.0000 | ORAL_TABLET | Freq: Four times a day (QID) | ORAL | 0 refills | Status: DC | PRN

## 2021-03-19 NOTE — Telephone Encounter (Signed)
Been approved    

## 2021-03-19 NOTE — Telephone Encounter (Signed)
This is a duplicate message, the refill request is pending approval in Dr. Otho Ket in box.

## 2021-03-19 NOTE — Telephone Encounter (Signed)
Pt called requesting a refill of hydrocodone. Please send to pharmacy on file. Pt phone number is [phone].

## 2021-03-24 ENCOUNTER — Ambulatory Visit (INDEPENDENT_AMBULATORY_CARE_PROVIDER_SITE_OTHER): Payer: Medicare Other | Admitting: Surgery

## 2021-03-24 ENCOUNTER — Encounter: Payer: Self-pay | Admitting: Surgery

## 2021-03-24 ENCOUNTER — Other Ambulatory Visit: Payer: Self-pay

## 2021-03-24 VITALS — BP 128/77 | HR 87 | Ht 72.0 in | Wt 224.0 lb

## 2021-03-24 DIAGNOSIS — M5416 Radiculopathy, lumbar region: Secondary | ICD-10-CM

## 2021-03-24 DIAGNOSIS — M48062 Spinal stenosis, lumbar region with neurogenic claudication: Secondary | ICD-10-CM

## 2021-03-24 NOTE — Progress Notes (Signed)
Surgical Instructions   Your procedure is scheduled on Monday 03/29/2021.  Report to [facility] Main Entrance "A" at 05:30 A.M., then check in with the Admitting office.  Call [phone] if you have problems or questions between now and the morning of surgery:   Remember: Do not eat after midnight the night before your surgery  You may drink clear liquids until 04:30am the morning of your surgery.   Clear liquids allowed are: Water, Non-Citrus Juices (without pulp), Carbonated Beverages, Clear Tea, Black Coffee Only (NO MILK, CREAM, or POWDERED CREAMER of any kind), and Gatorade   Take these medicines the morning of surgery with A SIP OF WATER:  Atorvastatin (Lipitor) Baclofen (Lioresal) Gabapentin (Neurontin) Loratadine (Claritin)   If needed you may take these medications the morning of surgery: Hydrocodone-acetaminophen (Norco) Methocarbamol (Robaxin) Oxycodone-acetaminophen (Percocet)  As of today, STOP taking any Diclofenac (Voltaren); Aspirin (unless otherwise instructed by your surgeon) or Aspirin-containing products; NSAIDS - Aleve, Naproxen, Ibuprofen, Motrin, Advil, Goody's, BC's, all herbal medications, fish oil, and all vitamins.  Follow your surgeon's instructions on when to stop Aspirin.  If no instructions were given by your surgeon then you will need to call the office to get those instructions.     WHAT DO I DO ABOUT MY DIABETES MEDICATION?  THE DAY BEFORE SURGERY Take usual dose of morning Novolog 70/30 insulin Take usual doses of Metformin (Glucophage) twice daily with meals DO NOT take Dapagliplozin [place])  THE NIGHT BEFORE SURGERY Take 24 units (70% of dose) of Novolog mix 70/30 insulin (Insulin aspart protamine-aspart).      THE MORNING OF SURGERY DO NOT TAKE INSULIN DO NOT TAKE ORAL DIABETES MEDICATIONS Hold Metformin (Glucophage) and Dapagliplozin (Farxiga)   HOW TO MANAGE YOUR DIABETES BEFORE AND AFTER SURGERY  Why is it important to  control my blood sugar before and after surgery? Improving blood sugar levels before and after surgery helps healing and can limit problems. A way of improving blood sugar control is eating a healthy diet by:  Eating less sugar and carbohydrates  Increasing activity/exercise  Talking with your doctor about reaching your blood sugar goals High blood sugars (greater than 180 mg/dL) can raise your risk of infections and slow your recovery, so you will need to focus on controlling your diabetes during the weeks before surgery. Make sure that the doctor who takes care of your diabetes knows about your planned surgery including the date and location.  How do I manage my blood sugar before surgery? Check your blood sugar at least 4 times a day, starting 2 days before surgery, to make sure that the level is not too high or low.  Check your blood sugar the morning of your surgery when you wake up and every 2 hours until you get to the Short Stay unit.  If your blood sugar is less than 70 mg/dL, you will need to treat for low blood sugar: Do not take insulin. Treat a low blood sugar (less than 70 mg/dL) with  cup of clear juice (cranberry or apple), 4 glucose tablets, OR glucose gel. Recheck blood sugar in 15 minutes after treatment (to make sure it is greater than 70 mg/dL). If your blood sugar is not greater than 70 mg/dL on recheck, call [phone] for further instructions. Report your blood sugar to the short stay nurse when you get to Short Stay.  If you are admitted to the hospital after surgery: Your blood sugar will be checked by the staff and you will  probably be given insulin after surgery (instead of oral diabetes medicines) to make sure you have good blood sugar levels. The goal for blood sugar control after surgery is 80-180 mg/dL.           Do not wear jewelry Do not wear lotions, powders, colognes, or deodorant. Do not shave 48 hours prior to surgery.  Men may shave face and  neck. Do not bring valuables to the hospital - [facility] is not responsible for any belongings or valuables.              Do NOT Smoke (Tobacco/Vaping) or drink Alcohol 24 hours prior to your procedure  If you use a CPAP at night, you may bring all equipment for your overnight stay.   Contacts, glasses, hearing aids, dentures or partials may not be worn into surgery, please bring cases for these belongings   For patients admitted to the hospital, discharge time will be determined by your treatment team.   Patients discharged the day of surgery will not be allowed to drive home, and someone needs to stay with them for 24 hours.  ONLY ONE (1) SUPPORT PERSON MAY WAIT IN THE WAITING AREA WHILE YOU ARE IN SURGERY. NO VISITORS WILL BE ALLOWED IN PRE-OP WHERE PATIENTS GET READY FOR SURGERY.  TWO (2) VISITORS WILL BE ALLOWED IN YOUR ROOM IF YOU ARE ADMITTED AFTER SURGERY.  Minor children may have two parents present. Special consideration for safety and communication needs will be reviewed on a case by case basis.  Special instructions:    Oral Hygiene is also important to reduce your risk of infection.  Remember - BRUSH YOUR TEETH THE MORNING OF SURGERY WITH YOUR REGULAR TOOTHPASTE   [place]- Preparing For Surgery  Before surgery, you can play an important role. Because skin is not sterile, your skin needs to be as free of germs as possible. You can reduce the number of germs on your skin by washing with CHG (chlorahexidine gluconate) Soap before surgery.  CHG is an antiseptic cleaner which kills germs and bonds with the skin to continue killing germs even after washing.     Please do not use if you have an allergy to CHG or antibacterial soaps. If your skin becomes reddened/irritated stop using the CHG.  Do not shave (including legs and underarms) for at least 48 hours prior to first CHG shower. It is OK to shave your face.  Please follow these instructions carefully.     Shower the  NIGHT BEFORE SURGERY and the MORNING OF SURGERY with CHG Soap.   If you chose to wash your hair, wash your hair first as usual with your normal shampoo. After you shampoo, rinse your hair and body thoroughly to remove the shampoo.    Then [employer] and genitals (private parts) with your normal soap and rinse thoroughly to remove soap.  Next use the CHG Soap as you would any other liquid soap. You can apply CHG directly to the skin and wash gently with a clean washcloth.   Apply the CHG Soap to your body ONLY FROM THE NECK DOWN.  Do not use on open wounds or open sores. Avoid contact with your eyes, ears, mouth and genitals (private parts). Wash Face and genitals (private parts)  with your normal soap.   Wash thoroughly, paying special attention to the area where your surgery will be performed.  Thoroughly rinse your body with warm water from the neck down.  DO NOT shower/wash  with your normal soap after using and rinsing off the CHG Soap.  Pat yourself dry with a CLEAN TOWEL.  Wear CLEAN PAJAMAS to bed the night before surgery  Place CLEAN SHEETS on your bed the night before your surgery  DO NOT SLEEP WITH PETS.   Day of Surgery:  Take a shower with CHG soap. Wear Clean/Comfortable clothing the morning of surgery Do not apply any deodorants/lotions.   Remember to brush your teeth WITH YOUR REGULAR TOOTHPASTE.   Please read over the following fact sheets that you were given.

## 2021-03-24 NOTE — Progress Notes (Signed)
65 year old white male with history of L3-4 HNP/stenosis comes in for preop evaluation.  He continues to have worsening low back pain and lower extremity radiculopathy.  Failed conservative treatment.  He is wanting to proceed with RIGHT L3-4 TRANSFORAMINAL LUMBAR INTERBODY FUSION WITH RODS, SCREWS AND CAGE, LOCAL BONE GRAFT, ALLOGRAFT BONE GRAFT AND VIVIGEN.  Today history and physical performed.  Review of systems negative.  I reviewed patient's chart.  Patient has a history of coronary artery disease and was last seen by cardiologist January 2021 but I do not see where he followed up with them at their recommendation of 6 months.  He had a heart catheterization June 2020.  Surgery scheduler advised me that medical clearance was requested but patient has not seen his PCP in about 2 years and patient acknowledges this.  Patient has documented history of cocaine and marijuana use.  States that last use cocaine "about 8 months ago" and marijuana "6 to 7 months ago".  States that he is around friends that smoke crack and use marijuana.  Patient going for preop labs tomorrow morning.  Surgical procedure discussed along with potential hospital stay.  All questions answered.

## 2021-03-25 ENCOUNTER — Telehealth: Payer: Self-pay

## 2021-03-25 ENCOUNTER — Telehealth: Payer: Self-pay | Admitting: Internal Medicine

## 2021-03-25 ENCOUNTER — Encounter (HOSPITAL_COMMUNITY): Payer: Self-pay

## 2021-03-25 ENCOUNTER — Encounter (HOSPITAL_COMMUNITY)
Admission: RE | Admit: 2021-03-25 | Discharge: 2021-03-25 | Disposition: A | Discharge status: Home / Self Care | Payer: Medicare Other | Source: Ambulatory Visit | Attending: Specialist | Admitting: Specialist

## 2021-03-25 ENCOUNTER — Encounter (HOSPITAL_COMMUNITY): Payer: Self-pay | Admitting: Physician Assistant

## 2021-03-25 DIAGNOSIS — Z01812 Encounter for preprocedural laboratory examination: Secondary | ICD-10-CM | POA: Insufficient documentation

## 2021-03-25 DIAGNOSIS — E1159 Type 2 diabetes mellitus with other circulatory complications: Secondary | ICD-10-CM

## 2021-03-25 DIAGNOSIS — Z20822 Contact with and (suspected) exposure to covid-19: Secondary | ICD-10-CM | POA: Insufficient documentation

## 2021-03-25 LAB — COMPREHENSIVE METABOLIC PANEL
ALT: 28 U/L (ref 0–44)
AST: 22 U/L (ref 15–41)
Albumin: 3.7 g/dL (ref 3.5–5.0)
Alkaline Phosphatase: 66 U/L (ref 38–126)
Anion gap: 10 (ref 5–15)
BUN: 23 mg/dL (ref 8–23)
CO2: 20 mmol/L — ABNORMAL LOW (ref 22–32)
Calcium: 9.2 mg/dL (ref 8.9–10.3)
Chloride: 105 mmol/L (ref 98–111)
Creatinine, Ser: 0.89 mg/dL (ref 0.61–1.24)
GFR, Estimated: >60 mL/min (ref >60)
Glucose, Bld: 203 mg/dL — ABNORMAL HIGH (ref 70–99)
Potassium: 4.6 mmol/L (ref 3.5–5.1)
Sodium: 135 mmol/L (ref 135–145)
Total Bilirubin: 0.4 mg/dL (ref 0.3–1.2)
Total Protein: 6.5 g/dL (ref 6.5–8.1)

## 2021-03-25 LAB — SARS CORONAVIRUS 2 (TAT 6-24 HRS): SARS Coronavirus 2: NEGATIVE

## 2021-03-25 LAB — URINALYSIS, ROUTINE W REFLEX MICROSCOPIC
Bilirubin Urine: NEGATIVE
Glucose, UA: 50 mg/dL — AB
Ketones, ur: NEGATIVE mg/dL
Nitrite: NEGATIVE
Protein, ur: 30 mg/dL — AB
Specific Gravity, Urine: 1.015 (ref 1.005–1.030)
WBC, UA: >50 WBC/hpf — ABNORMAL HIGH (ref 0–5)
pH: 5 (ref 5.0–8.0)

## 2021-03-25 LAB — SURGICAL PCR SCREEN
MRSA, PCR: NEGATIVE
Staphylococcus aureus: POSITIVE — AB

## 2021-03-25 LAB — TYPE AND SCREEN
ABO/RH(D): O POS
Antibody Screen: NEGATIVE

## 2021-03-25 LAB — RAPID URINE DRUG SCREEN, HOSP PERFORMED
Amphetamines: NOT DETECTED
Barbiturates: NOT DETECTED
Benzodiazepines: NOT DETECTED
Cocaine: POSITIVE — AB
Opiates: NOT DETECTED
Tetrahydrocannabinol: NOT DETECTED

## 2021-03-25 LAB — HEMOGLOBIN A1C
Hgb A1c MFr Bld: 8 % — ABNORMAL HIGH (ref 4.8–5.6)
Mean Plasma Glucose: 182.9 mg/dL

## 2021-03-25 LAB — PROTIME-INR
INR: 0.9 (ref 0.8–1.2)
Prothrombin Time: 12.1 seconds (ref 11.4–15.2)

## 2021-03-25 LAB — GLUCOSE, CAPILLARY: Glucose-Capillary: 202 mg/dL — ABNORMAL HIGH (ref 70–99)

## 2021-03-25 MED ORDER — SCOPOLAMINE 1 MG/3DAYS TD PT72
MEDICATED_PATCH | TRANSDERMAL | Status: AC
  Filled 2021-03-25: qty 1

## 2021-03-25 NOTE — Progress Notes (Addendum)
PCP - Charlott Rakes Cardiologist - denies  Chest x-ray - 03/14/21 EKG - 03/16/21 ECHO - 01/09/19 Cardiac Cath - 01/08/19  DM - Type 2 Fasting Blood Sugar - 150-200   ERAS Protcol - yes, water ordered & given   COVID TEST- 03/25/21   Anesthesia review: yes, tested positive for cocaine (at PAT appointment), A1c - 8.0, abnormal UA Benjiman Core notified via inbox of resulted labs.  Patient denies shortness of breath, fever, cough and chest pain at PAT appointment   All instructions explained to the patient, with a verbal understanding of the material. Patient agrees to go over the instructions while at home for a better understanding. Patient also instructed to self quarantine after being tested for COVID-19. The opportunity to ask questions was provided.

## 2021-03-25 NOTE — Telephone Encounter (Signed)
Pt at front desk stating that he was supposed to have surgery and A1c is in the 8's.Pt stating that sugar has been running high, 200-400. Pt would like different insulin in called in. To get his sugars better under control. Pt request to be called.

## 2021-03-25 NOTE — Progress Notes (Addendum)
Ryan Farmer with Dr. Otho Ket office notified of positive drug screen at PAT appointment.  Ryan Farmer stated she would pass on the message to [facility] (Dr. Otho Ket scheduler).  Office to follow up.   Chart sent to anesthesia for follow up, as well.

## 2021-03-25 NOTE — Progress Notes (Signed)
At PAT appointment, patient stated he was unable to read.    Verbal consent done for patient.  Verbal instructions given to patient regarding surgery instructions.

## 2021-03-25 NOTE — Telephone Encounter (Signed)
Patient's urine drug screen came back positive for Cocaine.  Per Dr. Louanne Skye, cancel surgery for Monday, 03/29/21.  Patient needs to get that problem under control before surgery.  I called and advised patient that surgery is cancelled for this reason.  I also reiterated that Jeneen Rinks mentioned about him following up with cardiologist, he will call for an appointment.  Also, A1c borderline at 8.0 so he will call endocrinologist to get advice on that.

## 2021-03-26 ENCOUNTER — Telehealth: Payer: Self-pay | Admitting: Physician Assistant

## 2021-03-26 ENCOUNTER — Other Ambulatory Visit: Payer: Self-pay | Admitting: Internal Medicine

## 2021-03-26 MED ORDER — BD PEN NEEDLE NANO 2ND GEN 32G X 4 MM MISC: 1.0000 | Freq: Four times a day (QID) | 3 refills | Status: DC

## 2021-03-26 MED ORDER — ACCU-CHEK AVIVA PLUS W/DEVICE KIT: 1.0000 | PACK | Freq: Three times a day (TID) | 0 refills | Status: DC

## 2021-03-26 MED ORDER — TRESIBA FLEXTOUCH 100 UNIT/ML ~~LOC~~ SOPN: 34.0000 [IU] | PEN_INJECTOR | Freq: Every day | SUBCUTANEOUS | 3 refills | Status: DC

## 2021-03-26 MED ORDER — NOVOLOG FLEXPEN 100 UNIT/ML ~~LOC~~ SOPN: 12.0000 [IU] | PEN_INJECTOR | Freq: Three times a day (TID) | SUBCUTANEOUS | 6 refills | Status: DC

## 2021-03-26 NOTE — Telephone Encounter (Signed)
Patient has been notified in change of medications. Script for new meter has been sent

## 2021-03-26 NOTE — Telephone Encounter (Signed)
Patient wanted to know if pur office would do blood work to make sure he doesn't have any drugs in his blood before his surgery.   He says he has been around people who have smoked and is worried about secondhand smoke.   Please let the patient know what we can do to help him

## 2021-03-26 NOTE — Progress Notes (Signed)
Pt called asking to change his insulin type because the mix is not helping    Will change to MDI regimen    Tresiba 34 units once daily  Novolog 12 units with each meal     Abby Nena Jordan, MD  [facility]  [facility] [address] [place], [place] [address] Phone: [phone] FAX: [phone]

## 2021-03-26 NOTE — Telephone Encounter (Signed)
Patient states that he has been taking 32 units at breakfast and supper. He has also been taking extra 32 units several times when it has been high. Please advise

## 2021-03-26 NOTE — Telephone Encounter (Signed)
Pt advised to contact surgeon office who will be performing his surgery. Pt verbalized understanding.

## 2021-03-29 ENCOUNTER — Encounter (HOSPITAL_COMMUNITY): Admission: RE | Payer: Self-pay | Source: Home / Self Care

## 2021-03-29 ENCOUNTER — Other Ambulatory Visit: Payer: Self-pay | Admitting: Specialist

## 2021-03-29 ENCOUNTER — Inpatient Hospital Stay (HOSPITAL_COMMUNITY): Admission: RE | Admit: 2021-03-29 | Payer: Medicare Other | Source: Home / Self Care | Admitting: Specialist

## 2021-03-29 ENCOUNTER — Telehealth: Payer: Self-pay | Admitting: Specialist

## 2021-03-29 SURGERY — POSTERIOR LUMBAR FUSION 1 LEVEL
Anesthesia: General

## 2021-03-29 NOTE — Addendum Note (Signed)
Addended by: Minda Ditto, Alyse Low N on: 03/29/2021 12:09 PM   Modules accepted: Orders

## 2021-03-29 NOTE — Telephone Encounter (Signed)
This is a JN pt.  

## 2021-03-29 NOTE — Progress Notes (Signed)
Cardiology Office Note:    Date:  03/30/2021   ID:  Ryan, Farmer 07/28/1955, MRN [id number]  PCP:  Ryan Rakes, MD  Cardiologist:  Ryan Majestic, MD   Referring MD: Ryan Rakes, MD   Chief Complaint  Patient presents with   Pre-op Exam    History of Present Illness:    Ryan Farmer is a 65 y.o. male with a hx of CAD, HTN, uncontrolled DM2, HLD, cocaine abuse, and tobacco abuse. He had a syncopal event in June 2020 in the setting of EKG showing 70m ST elevation in inferior leads. Heart cath with only minimal CAD and normal LVEF. Episode felt due to coronary spasm. Question role of cocaine use. UDS wasn't completed during that hospitalization.  A1c was 12.6% apparently on insulin PTA. [facility] and was doing well without recurrence of syncope. At that time, he reported abstinence from cocaine for 6 months but was still using marijuana.    He was scheduled for lumbar fusion surgery, but surgeon canceled due to cocaine positive UDS. He needs preop clearance and has not been seen in nearly 2 years.   He presents today for clearance. He states he has been using cocaine since he was 65yo. Last use was 1 week ago. He was positive for cocaine 03/25/21. He states he is now using cocaine for pain.  He reports occasional chest pain every 2-3 weeks that he thinks is gas. CP is not associated with meals. CP lasts 1 sec and resolves, no radiation and no associated symptoms. His mobility is limited by back pain and sciatic nerve pain. He is able to complete 4.0 METS without angina (push mow, grocery shopping).    Past Medical History:  Diagnosis Date   Arthritis    All over   CAD (coronary artery disease), native coronary artery, minimal by cardiac cath 01/08/19 01/09/2019   Chronic pain syndrome    Diabetes mellitus    History of cocaine use    History of kidney stones    Hypercholesteremia    Hypertension    Low back pain    Lumbar  radiculopathy    Right leg pain    Seborrheic dermatitis of scalp    Shingles     Past Surgical History:  Procedure Laterality Date   CIRCUMCISION N/A 03/16/2018   Procedure: CIRCUMCISION ADULT WITH PENILE BLOCK;  Surgeon: MAlexis Frock MD;  [facility]  Service: Urology;  Laterality: N/A;  440MINS   KNEE ARTHROSCOPY Bilateral    LEFT HEART CATH AND CORONARY ANGIOGRAPHY N/A 01/08/2019   Procedure: LEFT HEART CATH AND CORONARY ANGIOGRAPHY;  Surgeon: KTroy Sine MD;  [facility];  Service: Cardiovascular;  Laterality: N/A;    Current Medications: Current Meds  Medication Sig   acetaminophen (TYLENOL) 500 MG tablet Take 2,500 mg by mouth every 6 (six) hours as needed for mild pain.   atorvastatin (LIPITOR) 80 MG tablet Take 1 tablet (80 mg total) by mouth daily.   baclofen (LIORESAL) 10 MG tablet TAKE 1 TABLET(10 MG) BY MOUTH TWICE DAILY (Patient taking differently: Take 10 mg by mouth 2 (two) times daily.)   Blood Glucose Monitoring Suppl (ACCU-CHEK AVIVA PLUS) w/Device KIT 1 Device by Does not apply route 3 (three) times daily after meals.   dapagliflozin propanediol (FARXIGA) 5 MG TABS tablet Take 1 tablet (5 mg total) by mouth daily before breakfast.  diclofenac (VOLTAREN) 50 MG EC tablet TAKE 1 TABLET(50 MG) BY MOUTH TWICE DAILY (Patient taking differently: Take 50 mg by mouth 2 (two) times daily.)   diclofenac Sodium (VOLTAREN) 1 % GEL Apply 4 g topically 4 (four) times daily. (Patient taking differently: Apply 4 g topically 4 (four) times daily as needed (pain).)   gabapentin (NEURONTIN) 300 MG capsule TAKE 1 CAPSULE(300 MG) BY MOUTH THREE TIMES DAILY (Patient taking differently: Take 300 mg by mouth 3 (three) times daily.)   glucose blood (ACCU-CHEK GUIDE) test strip USE TWICE DAILY AS DIRECTED TO TEST BLOOD SUGAR   HYDROcodone-acetaminophen (NORCO) 7.5-325 MG tablet Take 1 tablet by mouth every 6 (six) hours as needed for moderate  pain.   insulin aspart (NOVOLOG FLEXPEN) 100 UNIT/ML FlexPen Inject 12 Units into the skin 3 (three) times daily with meals.   insulin degludec (TRESIBA FLEXTOUCH) 100 UNIT/ML FlexTouch Pen Inject 34 Units into the skin daily.   Insulin Pen Needle (BD PEN NEEDLE NANO 2ND GEN) 32G X 4 MM MISC Inject 1 Device into the skin in the morning, at noon, in the evening, and at bedtime.   Lancets (ACCU-CHEK SOFT TOUCH) lancets Use as instructed to test blood sugar 3 times daily DX E11.42   metFORMIN (GLUCOPHAGE) 1000 MG tablet TAKE 1 TABLET(1000 MG) BY MOUTH TWICE DAILY WITH A MEAL (Patient taking differently: Take 500 mg by mouth 2 (two) times daily with a meal.)   omega-3 acid ethyl esters (LOVAZA) 1 g capsule Take 1 capsule (1 g total) by mouth 2 (two) times daily.   oxyCODONE-acetaminophen (PERCOCET/ROXICET) 5-325 MG tablet Take 1 tablet by mouth every 6 (six) hours as needed for up to 12 doses for severe pain.   [DISCONTINUED] atorvastatin (LIPITOR) 40 MG tablet Take 1 tablet (40 mg total) by mouth daily.     Allergies:   Patient has no known allergies.   Social History   Socioeconomic History   Marital status: Single    Spouse name: Not on file   Number of children: Not on file   Years of education: Not on file   Highest education level: Not on file  Occupational History   Not on file  Tobacco Use   Smoking status: Every Day    Packs/day: 1.00    Years: 50.00    Pack years: 50.00    Types: Cigarettes   Smokeless tobacco: Former    Types: Nurse, children's Use: Never used  Substance and Sexual Activity   Alcohol use: Yes    Comment: occ   Drug use: Yes    Types: Marijuana, Cocaine    Comment: twice a week   Sexual activity: Not on file  Other Topics Concern   Not on file  Social History Narrative   Not on file   Social Determinants of Health   Financial Resource Strain: Not on file  Food Insecurity: Not on file  Transportation Needs: Not on file  Physical  Activity: Not on file  Stress: Not on file  Social Connections: Not on file     Family History: The patient's family history includes Cancer in his mother; Diabetes in his father; Heart disease in his father; Stroke in his father.  ROS:   Please see the history of present illness.     All other systems reviewed and are negative.  EKGs/Labs/Other Studies Reviewed:    The following studies were reviewed today:  Left heart cath 01/08/19: Prox RCA-1 lesion is 20%  stenosed. Prox RCA-2 lesion is 20% stenosed. Dist RCA lesion is 20% stenosed. Dist LAD lesion is 10% stenosed. Mid LAD lesion is 20% stenosed. The left ventricular systolic function is normal. LV end diastolic pressure is normal.   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  The left circumflex coronary artery is normal.  The right coronary artery has mild 20% proximal mid stenosis and a small area of muscle bridging in the distal vessel proximal to the PDA takeoff.   Normal LV function with EF 55% without focal segmental wall motion abnormalities.  LVEDP 14 mm.   RECOMMENDATION: The patient denied any chest pain prior to his syncopal spell.  He had transient 1 mm inferior ST elevation with resolution on subsequent ECG suggesting potential transient spasm.  Patient was hypotensive requiring fluid bolus and levophed.  At the completion of the procedure blood pressure was 111/67 on a reduced dose of Levophed with plans to wean as blood pressure allows.   Echo 01/09/19:  1. The left ventricle has normal systolic function with an ejection  fraction of 60-65%. The cavity size was normal. There is mildly increased  left ventricular wall thickness. Left ventricular diastolic Doppler  parameters are consistent with impaired  relaxation. No evidence of left ventricular regional wall motion   abnormalities.   2. The right ventricle has normal systolic function. The cavity was  normal. There is no increase in right ventricular wall thickness.   3. No evidence of mitral valve stenosis. No significant mitral  regurgitation.   4. The aortic valve is tricuspid. No stenosis of the aortic valve.   5. The aortic root is normal in size and structure.   6. 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   7. Normal IVC size. No complete TR doppler jet so unable to estimate PA  systolic pressure.     EKG:  EKG is  ordered today.  The ekg ordered today demonstrates sinus rhythm with HR 77  Recent Labs: 03/14/2021: Hemoglobin 16.7; Platelets 192 03/25/2021: ALT 28; BUN 23; Creatinine, Ser 0.89; Potassium 4.6; Sodium 135  Recent Lipid Panel    Component Value Date/Time   CHOL 196 12/24/2020 0802   CHOL 125 07/23/2019 0907   TRIG 318.0 (H) 12/24/2020 0802   HDL 35.80 (L) 12/24/2020 0802   HDL 29 (L) 07/23/2019 0907   CHOLHDL 5 12/24/2020 0802   VLDL 63.6 (H) 12/24/2020 0802   LDLCALC 56 07/23/2019 0907   LDLDIRECT 127.0 12/24/2020 0802    Physical Exam:    VS:  BP (!) 155/81   Pulse 77   Ht 6' (1.829 m)   Wt 222 lb 9.6 oz (101 kg)   SpO2 97%   BMI 30.19 kg/m     Wt Readings from Last 3 Encounters:  03/30/21 222 lb 9.6 oz (101 kg)  03/25/21 218 lb 9.6 oz (99.2 kg)  03/24/21 224 lb (101.6 kg)     GEN:  Well nourished, well developed in no acute distress HEENT: Normal NECK: No JVD; No carotid bruits LYMPHATICS: No lymphadenopathy CARDIAC: RRR, no murmurs, rubs, gallops RESPIRATORY:  Clear to auscultation without rales, wheezing or rhonchi  ABDOMEN: Soft, non-tender, non-distended MUSCULOSKELETAL:  No edema; No deformity  SKIN: Warm and dry NEUROLOGIC:  Alert and oriented x 3 PSYCHIATRIC:  Normal affect   ASSESSMENT:    1.  Coronary artery disease involving native coronary artery of native heart without angina pectoris   2. Essential hypertension    3. Pure hypercholesterolemia   4. Type 2 diabetes mellitus with hyperglycemia, with long-term current use of insulin (HCC)   5. Cocaine abuse ([facility])   6. Tobacco abuse   7. Preoperative clearance    PLAN:    In order of problems listed above:  CAD - nonobstructive by heart cath in June 2020 - not on ASA, continue 40 mg lipitor - no BB due to cocaine use - chest pain sounds atypical, no further ischemic evaluation for now   Hypertension - no antihypertensives - pressure elevated, he will monitor this   Hyperlipidemia with LDL goal < 70 12/24/2020: Cholesterol 196; HDL 35.80; Triglycerides 318.0; VLDL 63.6 Direct LDL was 127 - consider uptitrating therapy - on 40 mg lipitor now, will increase to 80 mg daily   DM with hyperglycemia - last A1c 03/25/21 was 8% - insulin per primary   Cocaine abuse Tobacco abuse - counseled on cessation for both   Preoperative evaluation for MACE He has minimal CAD on heart cath in the setting of EKG changes and syncope felt related to coronary spasm with history of cocaine use. No further episodes. DM treated with insulin. He has a 0.9% risk of MACE. He is able to complete 4.0 METS without angina despite back pain. He understands this risk and wishes to proceed. No further evaluation needed.    Follow up in 1 year with me or Dr. Claiborne Billings.   Medication Adjustments/Labs and Tests Ordered: Current medicines are reviewed at length with the patient today.  Concerns regarding medicines are outlined above.  Orders Placed This Encounter  Procedures   EKG 12-Lead    Meds ordered this encounter  Medications   atorvastatin (LIPITOR) 80 MG tablet    Sig: Take 1 tablet (80 mg total) by mouth daily.    Dispense:  90 tablet    Refill:  3     Signed, Ledora Bottcher, [place]  03/30/2021 9:02 AM    [place]

## 2021-03-29 NOTE — Telephone Encounter (Signed)
Patient called. He would like a refill  on percocet.

## 2021-03-29 NOTE — Telephone Encounter (Signed)
Patient called. He would like a refill on percocet.

## 2021-03-30 ENCOUNTER — Other Ambulatory Visit: Payer: Self-pay

## 2021-03-30 ENCOUNTER — Encounter: Payer: Self-pay | Admitting: Physician Assistant

## 2021-03-30 ENCOUNTER — Ambulatory Visit (INDEPENDENT_AMBULATORY_CARE_PROVIDER_SITE_OTHER): Payer: Medicare Other | Admitting: Physician Assistant

## 2021-03-30 ENCOUNTER — Telehealth: Payer: Self-pay | Admitting: Specialist

## 2021-03-30 VITALS — BP 155/81 | HR 77 | Ht 72.0 in | Wt 222.6 lb

## 2021-03-30 DIAGNOSIS — Z01818 Encounter for other preprocedural examination: Secondary | ICD-10-CM

## 2021-03-30 DIAGNOSIS — E1165 Type 2 diabetes mellitus with hyperglycemia: Secondary | ICD-10-CM

## 2021-03-30 DIAGNOSIS — F1410 Cocaine abuse, uncomplicated: Secondary | ICD-10-CM

## 2021-03-30 DIAGNOSIS — Z794 Long term (current) use of insulin: Secondary | ICD-10-CM

## 2021-03-30 DIAGNOSIS — Z720 Tobacco use: Secondary | ICD-10-CM

## 2021-03-30 DIAGNOSIS — I10 Essential (primary) hypertension: Secondary | ICD-10-CM | POA: Diagnosis not present

## 2021-03-30 DIAGNOSIS — I2510 Atherosclerotic heart disease of native coronary artery without angina pectoris: Secondary | ICD-10-CM

## 2021-03-30 DIAGNOSIS — Z01810 Encounter for preprocedural cardiovascular examination: Secondary | ICD-10-CM

## 2021-03-30 DIAGNOSIS — E7800 Pure hypercholesterolemia, unspecified: Secondary | ICD-10-CM | POA: Diagnosis not present

## 2021-03-30 MED ORDER — ATORVASTATIN CALCIUM 80 MG PO TABS: 80.0000 mg | ORAL_TABLET | Freq: Every day | ORAL | 3 refills | Status: DC

## 2021-03-30 NOTE — Telephone Encounter (Signed)
Patient called needing Rx refilled Percocet. Patient asked if he can get more than 12 tabs. The number to contact patient is [phone]

## 2021-03-30 NOTE — Patient Instructions (Signed)
Medication Instructions:  Increase Lipitor 80 mg ( 1 Tablet Daily). *If you need a refill on your cardiac medications before your next appointment, please call your pharmacy*   Lab Work: No Labs If you have labs (blood work) drawn today and your tests are completely normal, you will receive your results only by: [place] (if you have MyChart) OR A paper copy in the mail If you have any lab test that is abnormal or we need to change your treatment, we will call you to review the results.   Testing/Procedures: No Testing   Follow-Up: At [facility], you and your health needs are our priority.  As part of our continuing mission to provide you with exceptional heart care, we have created designated Provider Care Teams.  These Care Teams include your primary Cardiologist (physician) and Advanced Practice Providers (APPs -  Physician Assistants and Nurse Practitioners) who all work together to provide you with the care you need, when you need it.  We recommend signing up for the patient portal called "MyChart".  Sign up information is provided on this After Visit Summary.  MyChart is used to connect with patients for Virtual Visits (Telemedicine).  Patients are able to view lab/test results, encounter notes, upcoming appointments, etc.  Non-urgent messages can be sent to your provider as well.   To learn more about what you can do with MyChart, go to [email or web address].    Your next appointment:   1 year(s)  The format for your next appointment:   In Person  Provider:   Shelva Majestic, MD

## 2021-03-30 NOTE — Telephone Encounter (Signed)
I called and advised patient that Dr. Louanne Skye denied his pain med request due to him testing positive for Cocaine at his pre-op appt at the hospital. Patient asked what he was supposed to do for the pain, I advised to try OTC meds.

## 2021-03-31 ENCOUNTER — Other Ambulatory Visit: Payer: Self-pay

## 2021-03-31 ENCOUNTER — Ambulatory Visit (INDEPENDENT_AMBULATORY_CARE_PROVIDER_SITE_OTHER): Payer: Medicare Other | Admitting: Nurse Practitioner

## 2021-03-31 VITALS — BP 136/68 | HR 85 | Temp 98.3°F | Resp 18 | Ht 72.01 in | Wt 217.0 lb

## 2021-03-31 DIAGNOSIS — J0100 Acute maxillary sinusitis, unspecified: Secondary | ICD-10-CM | POA: Diagnosis not present

## 2021-03-31 DIAGNOSIS — B078 Other viral warts: Secondary | ICD-10-CM | POA: Diagnosis not present

## 2021-03-31 MED ORDER — CETIRIZINE HCL 10 MG PO TABS: 10.0000 mg | ORAL_TABLET | Freq: Every day | ORAL | 0 refills | Status: DC

## 2021-03-31 MED ORDER — AMOXICILLIN 875 MG PO TABS: 875.0000 mg | ORAL_TABLET | Freq: Two times a day (BID) | ORAL | 0 refills | Status: AC

## 2021-03-31 NOTE — Progress Notes (Signed)
Pt presents for sinus pressure pt reports been going on for awhile

## 2021-03-31 NOTE — Progress Notes (Signed)
_0  ID: Ryan Farmer, male    DOB: 1955/09/27, 65 y.o.   MRN: [id number]  Chief Complaint  Patient presents with   Establish Care    Referring provider: Charlott Rakes, MD   HPI  Patient presents today for acute visit.  Patient states that he has been having issues with sinus pressure and pain and congestion for the last few days.  He denies any significant fever.  He also complains today of a wart to the palmar side of his right hand.  We discussed that we can send him in a referral to dermatology for this. Denies f/c/s, n/v/d, hemoptysis, PND, chest pain or edema.     Not on File  Immunization History  Administered Date(s) Administered   Influenza,inj,Quad PF,6+ Mos 08/31/2015, 04/19/2016, 08/07/2018   Pneumococcal Polysaccharide-23 12/13/2016    Past Medical History:  Diagnosis Date   Arthritis    All over   CAD (coronary artery disease), native coronary artery, minimal by cardiac cath 01/08/19 01/09/2019   Chronic pain syndrome    Diabetes mellitus    History of cocaine use    History of kidney stones    Hypercholesteremia    Hypertension    Low back pain    Lumbar radiculopathy    Right leg pain    Seborrheic dermatitis of scalp    Shingles     Tobacco History: Social History   Tobacco Use  Smoking Status Every Day   Packs/day: 1.00   Years: 50.00   Pack years: 50.00   Types: Cigarettes  Smokeless Tobacco Former   Types: Chew   Ready to quit: Not Answered Counseling given: Not Answered   Outpatient Encounter Medications as of 03/31/2021  Medication Sig   amoxicillin (AMOXIL) 875 MG tablet Take 1 tablet (875 mg total) by mouth 2 (two) times daily for 10 days.   cetirizine (ZYRTEC) 10 MG tablet Take 1 tablet (10 mg total) by mouth daily.   acetaminophen (TYLENOL) 500 MG tablet Take 2,500 mg by mouth every 6 (six) hours as needed for mild pain.   atorvastatin (LIPITOR) 80 MG tablet Take 1 tablet (80 mg total) by mouth daily.   baclofen  (LIORESAL) 10 MG tablet TAKE 1 TABLET(10 MG) BY MOUTH TWICE DAILY (Patient taking differently: Take 10 mg by mouth 2 (two) times daily.)   Blood Glucose Monitoring Suppl (ACCU-CHEK AVIVA PLUS) w/Device KIT 1 Device by Does not apply route 3 (three) times daily after meals.   dapagliflozin propanediol (FARXIGA) 5 MG TABS tablet Take 1 tablet (5 mg total) by mouth daily before breakfast.   diclofenac (VOLTAREN) 50 MG EC tablet TAKE 1 TABLET(50 MG) BY MOUTH TWICE DAILY (Patient taking differently: Take 50 mg by mouth 2 (two) times daily.)   diclofenac Sodium (VOLTAREN) 1 % GEL Apply 4 g topically 4 (four) times daily. (Patient taking differently: Apply 4 g topically 4 (four) times daily as needed (pain).)   gabapentin (NEURONTIN) 300 MG capsule TAKE 1 CAPSULE(300 MG) BY MOUTH THREE TIMES DAILY (Patient taking differently: Take 300 mg by mouth 3 (three) times daily.)   glucose blood (ACCU-CHEK GUIDE) test strip USE TWICE DAILY AS DIRECTED TO TEST BLOOD SUGAR   HYDROcodone-acetaminophen (NORCO) 7.5-325 MG tablet Take 1 tablet by mouth every 6 (six) hours as needed for moderate pain.   insulin aspart (NOVOLOG FLEXPEN) 100 UNIT/ML FlexPen Inject 12 Units into the skin 3 (three) times daily with meals.   insulin degludec (TRESIBA FLEXTOUCH) 100 UNIT/ML FlexTouch Pen Inject  34 Units into the skin daily.   Insulin Pen Needle (BD PEN NEEDLE NANO 2ND GEN) 32G X 4 MM MISC Inject 1 Device into the skin in the morning, at noon, in the evening, and at bedtime.   Lancets (ACCU-CHEK SOFT TOUCH) lancets Use as instructed to test blood sugar 3 times daily DX E11.42   metFORMIN (GLUCOPHAGE) 1000 MG tablet TAKE 1 TABLET(1000 MG) BY MOUTH TWICE DAILY WITH A MEAL (Patient taking differently: Take 500 mg by mouth 2 (two) times daily with a meal.)   omega-3 acid ethyl esters (LOVAZA) 1 g capsule Take 1 capsule (1 g total) by mouth 2 (two) times daily.   [DISCONTINUED] oxyCODONE-acetaminophen (PERCOCET/ROXICET) 5-325 MG tablet  Take 1 tablet by mouth every 6 (six) hours as needed for up to 12 doses for severe pain.   No facility-administered encounter medications on file as of 03/31/2021.     Review of Systems  Review of Systems  Constitutional: Negative.   HENT:  Positive for congestion, postnasal drip, sinus pressure and sinus pain.   Cardiovascular: Negative.   Gastrointestinal: Negative.   Skin:        Wart to hand  Allergic/Immunologic: Negative.   Neurological: Negative.   Psychiatric/Behavioral: Negative.        Physical Exam  BP 136/68 (BP Location: Right Arm, Patient Position: Sitting, Cuff Size: Large)   Pulse 85   Temp 98.3 F (36.8 C)   Resp 18   Ht 6' 0.01" (1.829 m)   Wt 217 lb (98.4 kg)   SpO2 94%   BMI 29.42 kg/m   Wt Readings from Last 5 Encounters:  04/02/21 217 lb (98.4 kg)  03/31/21 217 lb (98.4 kg)  03/30/21 222 lb 9.6 oz (101 kg)  03/25/21 218 lb 9.6 oz (99.2 kg)  03/24/21 224 lb (101.6 kg)     Physical Exam Vitals and nursing note reviewed.  Constitutional:      General: He is not in acute distress.    Appearance: He is well-developed.  HENT:     Nose: Congestion present.     Right Sinus: Maxillary sinus tenderness present.     Left Sinus: Maxillary sinus tenderness present.  Cardiovascular:     Rate and Rhythm: Normal rate and regular rhythm.  Pulmonary:     Effort: Pulmonary effort is normal.     Breath sounds: Normal breath sounds.  Skin:    General: Skin is warm and dry.  Neurological:     Mental Status: He is alert and oriented to person, place, and time.  Psychiatric:        Mood and Affect: Mood normal.        Behavior: Behavior normal.     Assessment & Plan:   Acute non-recurrent maxillary sinusitis Stay well hydrated  Stay active  Will order zyrtec  Will order amoxicillin    Wart to right hand:  Will place referral to dermatology    Follow up:  Follow up with PCP within 3 months for diabetic management     Fenton Foy, NP 04/05/2021

## 2021-03-31 NOTE — Patient Instructions (Addendum)
Sinuitis:   Stay well hydrated  Stay active  Will order zyrtec  Will order amoxicillin    Wart to right hand:  Will place referral to dermatology    Follow up:  Follow up with PCP within 3 months for diabetic management

## 2021-04-02 ENCOUNTER — Other Ambulatory Visit: Payer: Self-pay

## 2021-04-02 ENCOUNTER — Encounter: Payer: Self-pay | Admitting: Specialist

## 2021-04-02 ENCOUNTER — Ambulatory Visit (INDEPENDENT_AMBULATORY_CARE_PROVIDER_SITE_OTHER): Payer: Medicare Other | Admitting: Specialist

## 2021-04-02 VITALS — BP 159/75 | HR 82 | Ht 72.0 in | Wt 217.0 lb

## 2021-04-02 DIAGNOSIS — M48062 Spinal stenosis, lumbar region with neurogenic claudication: Secondary | ICD-10-CM

## 2021-04-02 DIAGNOSIS — M5416 Radiculopathy, lumbar region: Secondary | ICD-10-CM

## 2021-04-02 DIAGNOSIS — I2510 Atherosclerotic heart disease of native coronary artery without angina pectoris: Secondary | ICD-10-CM

## 2021-04-02 DIAGNOSIS — M5136 Other intervertebral disc degeneration, lumbar region: Secondary | ICD-10-CM

## 2021-04-02 DIAGNOSIS — M4316 Spondylolisthesis, lumbar region: Secondary | ICD-10-CM | POA: Diagnosis not present

## 2021-04-02 MED ORDER — OXYCODONE-ACETAMINOPHEN 5-325 MG PO TABS: 1.0000 | ORAL_TABLET | Freq: Four times a day (QID) | ORAL | 0 refills | Status: DC | PRN

## 2021-04-02 NOTE — Progress Notes (Signed)
Office Visit Note   Patient: Ryan Farmer           Date of Birth: Nov 14, 1955           MRN: [id number] Visit Date: 04/02/2021              Requested by: Charlott Rakes, MD [place],  [place] [address] PCP: Charlott Rakes, MD   Assessment & Plan: Visit Diagnoses:  1. Spondylolisthesis, lumbar region   2. Spinal stenosis of lumbar region with neurogenic claudication   3. Degenerative disc disease, lumbar   4. Lumbar radiculopathy   5. Spondylolisthesis of lumbar region     Plan: 03/17/2021 MRI lumbar spine9/10/2020 shows severe lumbar spinal stenosis L3-4 with spondylolisthesis and large central disc herniation. He presented to the ER 9/4/202for evaluation due to increasing leg pain and weakness. Given meds for pain and told that surgery will be scheduled. Avoid bending, stooping and avoid lifting weights greater than 10 lbs. Avoid prolong standing and walking. Order for a new walker with wheels. Surgery scheduling secretary Kandice Hams, will call you in the next week to schedule for surgery.  Surgery recommended is a one level lumbar fusion L3-4 this would be done with rods, screws and cages with local bone graft and allograft (donor bone graft). Take hydrocodone for for pain. Risk of surgery includes risk of infection 1 in 100 patients with diabetes, bleeding 1/2% chance you would need a transfusion.   Risk to the nerves is one in 10,000. You will need to use a brace for 3 months and wean from the brace on the 4th month. Expect improved walking and standing tolerance. Expect relief of leg pain but numbness may persist depending on the length and degree of pressure that has been present.  Follow-Up Instructions: Return in about 4 weeks (around 04/30/2021).   Orders:  No orders of the defined types were placed in this encounter.  No orders of the defined types were placed in this encounter.     Procedures: No procedures performed   Clinical  Data: Findings:  CLINICAL DATA: Chronic back pain with extreme bilateral leg and ankle pain for 2.5 years. Bladder changes. No previous relevant surgery.  EXAM: MRI LUMBAR SPINE WITHOUT CONTRAST  TECHNIQUE: Multiplanar, multisequence MR imaging of the lumbar spine was performed. No intravenous contrast was administered.  COMPARISON: Lumbar MRI 01/04/2019. Abdominal CT 06/28/2020 and lumbar spine radiographs 02/26/2021.  FINDINGS: Despite efforts by the technologist and patient, mild motion artifact is present on today's exam and could not be eliminated. This reduces exam sensitivity and specificity.  Segmentation: Conventional anatomy assumed, with the last open disc space designated L5-S1.In correlation with interval CTs, there is transitional lumbosacral anatomy, and there are no visible ribs at what is labeled T12. The numbering for this examination is in keeping with the numbering applied to the previous lumbar MRI.  Alignment: Interval development of 7 mm of degenerative anterolisthesis at L3-4. Otherwise normal.  Vertebrae: Chronic unilateral pars defect on the right at L5 without marrow edema or displacement. No evidence of acute fracture or focal osseous lesion. The visualized sacroiliac joints appear unremarkable.  Conus medullaris: Extends to the L1 level and appears normal.  Paraspinal and other soft tissues: No significant paraspinal findings.  Disc levels:  T12-L1: Mild disc bulging with anterior osteophytes. No spinal stenosis or nerve root encroachment.  L1-2: Mild disc bulging and osteophyte formation. Mild bilateral facet hypertrophy. No spinal stenosis or nerve root encroachment.  L2-3:  Disc height is relatively preserved. There is annular disc bulging which is mildly eccentric to the right. There is mild facet and ligamentous hypertrophy. Stable mild narrowing of the lateral recesses and foramina without nerve root encroachment.  L3-4: As above,  interval development of a grade 1 degenerative anterolisthesis with progressive disc degeneration and loss of disc height. There is a new large central disc extrusion with slight cranial up turning and significant mass effect on the thecal sac. There is moderate facet and ligamentous hypertrophy with a small right-sided facet joint effusion. These factors contribute to severe multifactorial spinal stenosis with marked narrowing of both lateral recesses and moderate right-greater-than-left foraminal narrowing.  L4-5: Stable loss of disc height with annular disc bulging. The previously demonstrated small disc protrusion in the left subarticular zone has partially involuted. Mild facet and ligamentous hypertrophy. Mild narrowing of the lateral recesses and foramina bilaterally without definite residual nerve root encroachment.  L5-S1: Stable chronic broad-based left paracentral disc protrusion. No S1 nerve root displacement. Stable mild bilateral facet hypertrophy.  IMPRESSION: 1. Compared with the previous MRI from 2020, a degenerative anterolisthesis has developed at L3-4 associated with progressive disc degeneration and a large central disc extrusion causing severe spinal stenosis. There is probable encroachment on both L4 nerve roots in the canal as well as moderate right-greater-than-left foraminal narrowing. 2. A previously demonstrated small disc protrusion in the left subarticular zone at L4-5 has partially involuted, without residual nerve root encroachment. 3. The additional disc space levels appear stable. No acute osseous findings.   Electronically Signed By: Richardean Sale M.D. On: 03/14/2021 09:37   Subjective: Chief Complaint  Patient presents with  . Lower Back - Pain, Follow-up    65 year old male with spondylolisthesis and spinal stenosis. He was to have decompression and fusion but preop tox screen showed positive cocaine use. So surgery was cancelled and  went ahead and held narcotics. He is having typical pattern of neurogenic claudication and is requesting meds. His sugars are elevated above 275 and now the glucose is decreased to 100 and that is better. He is watching his sugar. Reports pain with cough or sneeze and with strain.   Review of Systems  Constitutional: Negative.   HENT: Negative.    Eyes: Negative.   Respiratory: Negative.    Cardiovascular: Negative.   Gastrointestinal: Negative.   Endocrine: Negative.   Genitourinary: Negative.   Musculoskeletal: Negative.   Skin: Negative.   Allergic/Immunologic: Negative.   Neurological: Negative.   Hematological: Negative.   Psychiatric/Behavioral: Negative.      Objective: Vital Signs: BP (!) 159/75 (BP Location: Left Arm, Patient Position: Sitting, Cuff Size: Normal)   Pulse 82   Ht 6' (1.829 m)   Wt 217 lb (98.4 kg)   BMI 29.43 kg/m   Physical Exam Constitutional:      Appearance: He is well-developed.  HENT:     Head: Normocephalic and atraumatic.  Eyes:     Pupils: Pupils are equal, round, and reactive to light.  Pulmonary:     Effort: Pulmonary effort is normal.     Breath sounds: Normal breath sounds.  Abdominal:     General: Bowel sounds are normal.     Palpations: Abdomen is soft.  Musculoskeletal:     Cervical back: Normal range of motion and neck supple.     Lumbar back: Negative right straight leg raise test and negative left straight leg raise test.  Skin:    General: Skin is warm and dry.  Neurological:     Mental Status: He is alert and oriented to person, place, and time.  Psychiatric:        Behavior: Behavior normal.        Thought Content: Thought content normal.        Judgment: Judgment normal.    Back Exam   Tenderness  The patient is experiencing tenderness in the lumbar.  Range of Motion  Extension:  abnormal  Flexion:  normal  Lateral bend right:  abnormal  Lateral bend left:  abnormal  Rotation right:  abnormal  Rotation  left:  abnormal   Muscle Strength  Right Quadriceps:  5/5  Left Quadriceps:  5/5  Right Hamstrings:  5/5  Left Hamstrings:  5/5   Tests  Straight leg raise right: negative Straight leg raise left: negative  Comments:  Pain with SLR bilateral. Weakness with foot DF bilaterlly left more painful thant right.      Specialty Comments:  No specialty comments available.  Imaging: No results found.   PMFS History: Patient Active Problem List   Diagnosis Date Noted  . Type 2 diabetes mellitus with hyperglycemia, with long-term current use of insulin ([place]) 08/20/2020  . Type 2 diabetes mellitus with microalbuminuria, with long-term current use of insulin ([place]) 10/14/2019  . Type 2 diabetes mellitus with diabetic polyneuropathy, with long-term current use of insulin ([place]) 04/15/2019  . Hypertriglyceridemia 04/15/2019  . Spinal stenosis of lumbar region with neurogenic claudication 03/07/2019  . Lumbar radiculopathy 03/07/2019  . Chronic bilateral low back pain with bilateral sciatica 03/07/2019  . CAD (coronary artery disease), native coronary artery, minimal by cardiac cath 01/08/19 01/09/2019  . Syncope and collapse   . Spondylosis without myelopathy or radiculopathy, lumbar region 04/26/2018  . Cocaine abuse ([place]) 12/19/2016  . Shingles outbreak 12/13/2016  . Diabetes type 2, uncontrolled ([place]) 04/19/2016  . Metatarsalgia 10/19/2015  . Back muscle spasm 11/18/2013  . Hyperlipidemia 11/18/2013  . HTN (hypertension) 11/22/2012  . Chronic pain syndrome 11/22/2012  . Tobacco use disorder 11/22/2012  . Dental caries 11/22/2012   Past Medical History:  Diagnosis Date  . Arthritis    All over  . CAD (coronary artery disease), native coronary artery, minimal by cardiac cath 01/08/19 01/09/2019  . Chronic pain syndrome   . Diabetes mellitus   . History of cocaine use   . History of kidney stones   . Hypercholesteremia   . Hypertension   . Low back pain   . Lumbar  radiculopathy   . Right leg pain   . Seborrheic dermatitis of scalp   . Shingles     Family History  Problem Relation Age of Onset  . Cancer Mother   . Stroke Father   . Heart disease Father   . Diabetes Father     Past Surgical History:  Procedure Laterality Date  . CIRCUMCISION N/A 03/16/2018   Procedure: CIRCUMCISION ADULT WITH PENILE BLOCK;  Surgeon: Alexis Frock, MD;  Location: [facility];  Service: Urology;  Laterality: N/A;  29 MINS  . KNEE ARTHROSCOPY Bilateral   . LEFT HEART CATH AND CORONARY ANGIOGRAPHY N/A 01/08/2019   Procedure: LEFT HEART CATH AND CORONARY ANGIOGRAPHY;  Surgeon: Troy Sine, MD;  Location: [facility];  Service: Cardiovascular;  Laterality: N/A;   Social History   Occupational History  . Not on file  Tobacco Use  . Smoking status: Every Day    Packs/day: 1.00    Years: 50.00  Pack years: 50.00    Types: Cigarettes  . Smokeless tobacco: Former    Types: Secondary school teacher  . Vaping Use: Never used  Substance and Sexual Activity  . Alcohol use: Yes    Comment: occ  . Drug use: Yes    Types: Marijuana, Cocaine    Comment: twice a week  . Sexual activity: Not on file

## 2021-04-02 NOTE — Patient Instructions (Signed)
03/17/2021 MRI lumbar spine9/10/2020 shows severe lumbar spinal stenosis L3-4 with spondylolisthesis and large central disc herniation. He presented to the ER 9/4/202for evaluation due to increasing leg pain and weakness. Given meds for pain and told that surgery will be scheduled. Avoid bending, stooping and avoid lifting weights greater than 10 lbs. Avoid prolong standing and walking. Order for a new walker with wheels. Surgery scheduling secretary Kandice Hams, will call you in the next week to schedule for surgery.  Surgery recommended is a one level lumbar fusion L3-4 this would be done with rods, screws and cages with local bone graft and allograft (donor bone graft). Take hydrocodone for for pain. Risk of surgery includes risk of infection 1 in 100 patients with diabetes, bleeding 1/2% chance you would need a transfusion.   Risk to the nerves is one in 10,000. You will need to use a brace for 3 months and wean from the brace on the 4th month. Expect improved walking and standing tolerance. Expect relief of leg pain but numbness may persist depending on the length and degree of pressure that has been present.

## 2021-04-05 ENCOUNTER — Encounter: Payer: Self-pay | Admitting: Nurse Practitioner

## 2021-04-05 DIAGNOSIS — J0100 Acute maxillary sinusitis, unspecified: Secondary | ICD-10-CM | POA: Insufficient documentation

## 2021-04-05 DIAGNOSIS — B078 Other viral warts: Secondary | ICD-10-CM | POA: Insufficient documentation

## 2021-04-05 NOTE — Assessment & Plan Note (Signed)
Stay well hydrated  Stay active  Will order zyrtec  Will order amoxicillin    Wart to right hand:  Will place referral to dermatology    Follow up:  Follow up with PCP within 3 months for diabetic management

## 2021-04-06 DIAGNOSIS — I10 Essential (primary) hypertension: Secondary | ICD-10-CM | POA: Diagnosis not present

## 2021-04-06 DIAGNOSIS — R5383 Other fatigue: Secondary | ICD-10-CM | POA: Diagnosis not present

## 2021-04-06 DIAGNOSIS — Z79899 Other long term (current) drug therapy: Secondary | ICD-10-CM | POA: Diagnosis not present

## 2021-04-06 DIAGNOSIS — Z131 Encounter for screening for diabetes mellitus: Secondary | ICD-10-CM | POA: Diagnosis not present

## 2021-04-06 DIAGNOSIS — M5440 Lumbago with sciatica, unspecified side: Secondary | ICD-10-CM | POA: Diagnosis not present

## 2021-04-06 DIAGNOSIS — G8929 Other chronic pain: Secondary | ICD-10-CM | POA: Diagnosis not present

## 2021-04-06 DIAGNOSIS — Z1159 Encounter for screening for other viral diseases: Secondary | ICD-10-CM | POA: Diagnosis not present

## 2021-04-09 DIAGNOSIS — M5416 Radiculopathy, lumbar region: Secondary | ICD-10-CM | POA: Diagnosis not present

## 2021-04-09 DIAGNOSIS — I10 Essential (primary) hypertension: Secondary | ICD-10-CM | POA: Diagnosis not present

## 2021-04-13 ENCOUNTER — Other Ambulatory Visit: Payer: Self-pay | Admitting: Specialist

## 2021-04-13 MED ORDER — OXYCODONE-ACETAMINOPHEN 5-325 MG PO TABS: 1.0000 | ORAL_TABLET | Freq: Four times a day (QID) | ORAL | 0 refills | Status: DC | PRN

## 2021-04-13 NOTE — Telephone Encounter (Signed)
Pt called asking for a refill of his pain rx, as well as something for his arthritis. Pt states he is clean now and would like someone to reach out to him about getting scheduled for surgery.   [phone]

## 2021-04-16 DIAGNOSIS — G8929 Other chronic pain: Secondary | ICD-10-CM | POA: Diagnosis not present

## 2021-04-16 DIAGNOSIS — I10 Essential (primary) hypertension: Secondary | ICD-10-CM | POA: Diagnosis not present

## 2021-04-16 DIAGNOSIS — Z79899 Other long term (current) drug therapy: Secondary | ICD-10-CM | POA: Diagnosis not present

## 2021-04-16 DIAGNOSIS — R809 Proteinuria, unspecified: Secondary | ICD-10-CM | POA: Diagnosis not present

## 2021-04-16 DIAGNOSIS — M549 Dorsalgia, unspecified: Secondary | ICD-10-CM | POA: Diagnosis not present

## 2021-04-16 DIAGNOSIS — E119 Type 2 diabetes mellitus without complications: Secondary | ICD-10-CM | POA: Diagnosis not present

## 2021-04-16 DIAGNOSIS — Z0000 Encounter for general adult medical examination without abnormal findings: Secondary | ICD-10-CM | POA: Diagnosis not present

## 2021-04-18 ENCOUNTER — Other Ambulatory Visit: Payer: Self-pay | Admitting: Nurse Practitioner

## 2021-04-18 ENCOUNTER — Encounter (HOSPITAL_COMMUNITY): Payer: Self-pay | Admitting: Specialist

## 2021-04-18 NOTE — Progress Notes (Signed)
PCP - Charlott Rakes Cardiologist - Fabian Sharp PA @ [facility] EKG - 03/30/21 Chest x-ray - 03/14/21 ECHO - 01/09/19 Cardiac Cath - 01/08/19 CPAP - na  Fasting Blood Sugar:  100-120  Blood Thinner Instructions: na Aspirin Instructions: na  ERAS Protcol - yes,   COVID TEST- 04-19-21  Anesthesia review: surgery cancelled due to positive cocaine in drug screen.   -------------  SDW INSTRUCTIONS:  Your procedure is scheduled on 04/20/21. Please report to [facility] Main Entrance "A" at 0530 A.M., and check in at the Admitting office. Call this number if you have problems the morning of surgery: [phone]   Remember: Do not eat  after midnight the night before your surgery  You may drink clear liquids until 0430 the morning of your surgery.   Clear liquids allowed are: Water, Non-Citrus Juices (without pulp), Carbonated Beverages, Clear Tea, Black Coffee Only, and Gatorade   Medications to take morning of surgery with a sip of water include: Lipitor,baclofen, zyrtec,tylenol or percocet.    Hold farxiga the day before surgery. Tresiba 17 units DOS Novolog 1/2 dose if blood sugar >220    ** PLEASE check your blood sugar the morning of your surgery when you wake up and every 2 hours until you get to the Short Stay unit.  If your blood sugar is less than 70 mg/dL, you will need to treat for low blood sugar: Do not take insulin. Treat a low blood sugar (less than 70 mg/dL) with  cup of clear juice (cranberry or apple), 4 glucose tablets, OR glucose gel. Recheck blood sugar in 15 minutes after treatment (to make sure it is greater than 70 mg/dL). If your blood sugar is not greater than 70 mg/dL on recheck, call [phone] for further instructions.   As of today, STOP taking any Aspirin (unless otherwise instructed by your surgeon), Aleve, Naproxen, Ibuprofen, Motrin, Advil, Goody's, BC's, all herbal medications, fish oil, and all vitamins.    The Morning of Surgery Do not  wear jewelry, make-up or nail polish. Do not wear lotions, powders, or perfumes/colognes, or deodorant Do not shave 48 hours prior to surgery.   Men may shave face and neck. Do not bring valuables to the hospital. [facility] is not responsible for any belongings or valuables.  If you are a smoker, DO NOT Smoke 24 hours prior to surgery  If you wear a CPAP at night please bring your mask the morning of surgery   Remember that you must have someone to transport you home after your surgery, and remain with you for 24 hours if you are discharged the same day.  Please bring cases for contacts, glasses, hearing aids, dentures or bridgework because it cannot be worn into surgery.   Patients discharged the day of surgery will not be allowed to drive home.   Please shower the NIGHT BEFORE/MORNING OF SURGERY (use antibacterial soap like DIAL soap if possible). Wear comfortable clothes the morning of surgery. Oral Hygiene is also important to reduce your risk of infection.  Remember - BRUSH YOUR TEETH THE MORNING OF SURGERY WITH YOUR REGULAR TOOTHPASTE  Patient denies shortness of breath, fever, cough and chest pain.

## 2021-04-19 ENCOUNTER — Other Ambulatory Visit (HOSPITAL_COMMUNITY)
Admission: RE | Admit: 2021-04-19 | Discharge: 2021-04-19 | Disposition: A | Discharge status: Home / Self Care | Payer: Medicare Other | Source: Ambulatory Visit | Attending: Specialist | Admitting: Specialist

## 2021-04-19 DIAGNOSIS — Z20822 Contact with and (suspected) exposure to covid-19: Secondary | ICD-10-CM | POA: Diagnosis not present

## 2021-04-19 DIAGNOSIS — Z01812 Encounter for preprocedural laboratory examination: Secondary | ICD-10-CM | POA: Insufficient documentation

## 2021-04-19 LAB — SARS CORONAVIRUS 2 (TAT 6-24 HRS): SARS Coronavirus 2: NEGATIVE

## 2021-04-19 NOTE — Anesthesia Preprocedure Evaluation (Addendum)
Anesthesia Evaluation  Patient identified by MRN, date of birth, ID band Patient awake    Reviewed: Allergy & Precautions, NPO status , Patient's Chart, lab work & pertinent test results  Airway Mallampati: II  TM Distance: >3 FB Neck ROM: Full    Dental no notable dental hx.    Pulmonary Current Smoker,    Pulmonary exam normal breath sounds clear to auscultation       Cardiovascular hypertension, + CAD  Normal cardiovascular exam Rhythm:Regular Rate:Normal     Neuro/Psych negative neurological ROS  negative psych ROS   GI/Hepatic negative GI ROS, (+)     substance abuse  cocaine use,   Endo/Other  diabetes, Type 2  Renal/GU negative Renal ROS  negative genitourinary   Musculoskeletal negative musculoskeletal ROS (+)   Abdominal   Peds negative pediatric ROS (+)  Hematology negative hematology ROS (+)   Anesthesia Other Findings   Reproductive/Obstetrics negative OB ROS                            Anesthesia Physical Anesthesia Plan  ASA: 3  Anesthesia Plan: General   Post-op Pain Management:    Induction: Intravenous  PONV Risk Score and Plan: 2 and Ondansetron, Dexamethasone and Treatment may vary due to age or medical condition  Airway Management Planned: Oral ETT  Additional Equipment:   Intra-op Plan:   Post-operative Plan: Extubation in OR  Informed Consent: I have reviewed the patients History and Physical, chart, labs and discussed the procedure including the risks, benefits and alternatives for the proposed anesthesia with the patient or authorized representative who has indicated his/her understanding and acceptance.     Dental advisory given  Plan Discussed with: CRNA and Surgeon  Anesthesia Plan Comments: (See APP note by Durel Salts, FNP )       Anesthesia Quick Evaluation

## 2021-04-19 NOTE — Progress Notes (Signed)
Anesthesia Chart Review:  Pt is a same day work up   Case: [id number] Date/Time: 04/20/21 0715   Procedure: RIGHT L3-4 TRANSFORAMINAL LUMBAR INTERBODY FUSION WITH RODS, SCREWS AND CAGE, LOCAL BONE GRAFT, ALLOGRAFT BONE GRAFT AND VIVIGEN   Anesthesia type: General   Pre-op diagnosis: L3-4 spondylolisthesis with large central disc herniation and severe spinal stenosis   [facility] ROOM 05 / [facility]   Surgeons: Jessy Oto, MD       DISCUSSION: Pt is 65 years old with hx CAD, ascending aortic dilatation (36m on 2020 echo), HTN, DM. Current smoker. Hx cocaine abuse.   Surgery originally scheduled for September was postponed for +cocaine on drug screen.     PROVIDERS: - PCP is NCharlott Rakes MD - Cardiologist is TShelva Majestic MD. Cleared for surgery as last office visit 03/30/21 with AFabian Sharp PA  LABS: Labs reviewed: Acceptable for surgery. - PT 03/25/21 normal - HbA1c 03/25/21 was 8.0 -  CMP 03/25/21 showed glucose 203   IMAGES: CXR 03/14/21: No active cardiopulmonary disease   EKG 03/30/21: NSR   CV: Echo 01/09/19:  1. The left ventricle has normal systolic function with an ejection fraction of 60-65%. The cavity size was normal. There is mildly increased left ventricular wall thickness. Left ventricular diastolic Doppler parameters are consistent with impaired relaxation. No evidence of left ventricular regional wall motion abnormalities.  2. The right ventricle has normal systolic function. The cavity was normal. There is no increase in right ventricular wall thickness.  3. No evidence of mitral valve stenosis. No significant mitral regurgitation.  4. The aortic valve is tricuspid. No stenosis of the aortic valve.  5. The aortic root is normal in size and structure.  6. 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  7. Normal IVC size. No complete TR doppler jet so unable to estimate PA systolic pressure.   Cardiac cath 01/08/19:  Prox RCA-1 lesion is 20%  stenosed. Prox RCA-2 lesion is 20% stenosed. Dist RCA lesion is 20% stenosed. Dist LAD lesion is 10% stenosed. Mid LAD lesion is 20% stenosed. The left ventricular systolic function is normal. LV end diastolic pressure is normal. - Mild nonobstructive CAD with smooth mild 20% proximal LAD narrowing and evidence for mild to moderate mid systolic bridging of the mid LAD with narrowing of to 40% during systole.  The left circumflex coronary artery is normal.  The right coronary artery has mild 20% proximal mid stenosis and a small area of muscle bridging in the distal vessel proximal to the PDA takeoff. - Normal LV function with EF 55% without focal segmental wall motion abnormalities.  LVEDP 14 mm.   Past Medical History:  Diagnosis Date   Arthritis    All over   CAD (coronary artery disease), native coronary artery, minimal by cardiac cath 01/08/19 01/09/2019   Chronic pain syndrome    Diabetes mellitus    History of cocaine use    History of kidney stones    Hypercholesteremia    Hypertension    Low back pain    Lumbar radiculopathy    Right leg pain    Seborrheic dermatitis of scalp    Shingles     Past Surgical History:  Procedure Laterality Date   CIRCUMCISION N/A 03/16/2018   Procedure: CIRCUMCISION ADULT WITH PENILE BLOCK;  Surgeon: MAlexis Frock MD;  [facility]  Service: Urology;  Laterality: N/A;  45 MINS   KNEE ARTHROSCOPY Bilateral  LEFT HEART CATH AND CORONARY ANGIOGRAPHY N/A 01/08/2019   Procedure: LEFT HEART CATH AND CORONARY ANGIOGRAPHY;  Surgeon: Troy Sine, MD;  Location: [facility];  Service: Cardiovascular;  Laterality: N/A;    MEDICATIONS: No current facility-administered medications for this encounter.    acetaminophen (TYLENOL) 500 MG tablet   atorvastatin (LIPITOR) 80 MG tablet   baclofen (LIORESAL) 10 MG tablet   Blood Glucose Monitoring Suppl (ACCU-CHEK AVIVA PLUS) w/Device KIT   cetirizine (ZYRTEC) 10 MG  tablet   dapagliflozin propanediol (FARXIGA) 5 MG TABS tablet   diclofenac (VOLTAREN) 50 MG EC tablet   diclofenac Sodium (VOLTAREN) 1 % GEL   gabapentin (NEURONTIN) 300 MG capsule   glucose blood (ACCU-CHEK GUIDE) test strip   HYDROcodone-acetaminophen (NORCO) 7.5-325 MG tablet   insulin aspart (NOVOLOG FLEXPEN) 100 UNIT/ML FlexPen   insulin degludec (TRESIBA FLEXTOUCH) 100 UNIT/ML FlexTouch Pen   Insulin Pen Needle (BD PEN NEEDLE NANO 2ND GEN) 32G X 4 MM MISC   Lancets (ACCU-CHEK SOFT TOUCH) lancets   metFORMIN (GLUCOPHAGE) 1000 MG tablet   omega-3 acid ethyl esters (LOVAZA) 1 g capsule   oxyCODONE-acetaminophen (PERCOCET/ROXICET) 5-325 MG tablet    If labs acceptable day of surgery, I anticipate pt can proceed with surgery as scheduled.  Willeen Cass, PhD, FNP-BC [facility]/Anesthesiology Phone: [phone] 04/19/2021 10:44 AM

## 2021-04-20 ENCOUNTER — Inpatient Hospital Stay (HOSPITAL_COMMUNITY)
Admission: RE | Disposition: A | Discharge status: Home / Self Care | Payer: Self-pay | Source: Home / Self Care | Attending: Specialist

## 2021-04-20 ENCOUNTER — Other Ambulatory Visit: Payer: Self-pay

## 2021-04-20 ENCOUNTER — Observation Stay (HOSPITAL_COMMUNITY)
Admission: RE | Admit: 2021-04-20 | Discharge: 2021-04-21 | Disposition: A | Discharge status: Home / Self Care | Payer: Medicare Other | Attending: Specialist | Admitting: Specialist

## 2021-04-20 ENCOUNTER — Inpatient Hospital Stay (HOSPITAL_COMMUNITY): Payer: Medicare Other | Admitting: Emergency Medicine

## 2021-04-20 ENCOUNTER — Encounter (HOSPITAL_COMMUNITY): Payer: Self-pay | Admitting: Specialist

## 2021-04-20 ENCOUNTER — Inpatient Hospital Stay (HOSPITAL_COMMUNITY): Payer: Medicare Other

## 2021-04-20 DIAGNOSIS — F1410 Cocaine abuse, uncomplicated: Secondary | ICD-10-CM

## 2021-04-20 DIAGNOSIS — F17210 Nicotine dependence, cigarettes, uncomplicated: Secondary | ICD-10-CM | POA: Diagnosis not present

## 2021-04-20 DIAGNOSIS — M48062 Spinal stenosis, lumbar region with neurogenic claudication: Secondary | ICD-10-CM | POA: Diagnosis present

## 2021-04-20 DIAGNOSIS — Z79899 Other long term (current) drug therapy: Secondary | ICD-10-CM | POA: Diagnosis not present

## 2021-04-20 DIAGNOSIS — M48061 Spinal stenosis, lumbar region without neurogenic claudication: Secondary | ICD-10-CM | POA: Diagnosis not present

## 2021-04-20 DIAGNOSIS — M4807 Spinal stenosis, lumbosacral region: Secondary | ICD-10-CM | POA: Diagnosis present

## 2021-04-20 DIAGNOSIS — Z01818 Encounter for other preprocedural examination: Secondary | ICD-10-CM

## 2021-04-20 DIAGNOSIS — E119 Type 2 diabetes mellitus without complications: Secondary | ICD-10-CM | POA: Insufficient documentation

## 2021-04-20 DIAGNOSIS — Z419 Encounter for procedure for purposes other than remedying health state, unspecified: Secondary | ICD-10-CM

## 2021-04-20 DIAGNOSIS — I2510 Atherosclerotic heart disease of native coronary artery without angina pectoris: Secondary | ICD-10-CM | POA: Insufficient documentation

## 2021-04-20 DIAGNOSIS — I10 Essential (primary) hypertension: Secondary | ICD-10-CM | POA: Insufficient documentation

## 2021-04-20 DIAGNOSIS — M4316 Spondylolisthesis, lumbar region: Secondary | ICD-10-CM | POA: Diagnosis not present

## 2021-04-20 DIAGNOSIS — E1165 Type 2 diabetes mellitus with hyperglycemia: Secondary | ICD-10-CM | POA: Diagnosis not present

## 2021-04-20 DIAGNOSIS — M5126 Other intervertebral disc displacement, lumbar region: Secondary | ICD-10-CM | POA: Diagnosis not present

## 2021-04-20 DIAGNOSIS — M4326 Fusion of spine, lumbar region: Secondary | ICD-10-CM | POA: Diagnosis not present

## 2021-04-20 DIAGNOSIS — Z7984 Long term (current) use of oral hypoglycemic drugs: Secondary | ICD-10-CM | POA: Diagnosis not present

## 2021-04-20 DIAGNOSIS — Z981 Arthrodesis status: Secondary | ICD-10-CM | POA: Diagnosis not present

## 2021-04-20 DIAGNOSIS — Z794 Long term (current) use of insulin: Secondary | ICD-10-CM | POA: Diagnosis not present

## 2021-04-20 LAB — POCT I-STAT EG7
Acid-base deficit: 6 mmol/L — ABNORMAL HIGH (ref 0.0–2.0)
Bicarbonate: 24.4 mmol/L (ref 20.0–28.0)
Calcium, Ion: 1.22 mmol/L (ref 1.15–1.40)
HCT: 46 % (ref 39.0–52.0)
Hemoglobin: 15.6 g/dL (ref 13.0–17.0)
O2 Saturation: 97 %
Patient temperature: 36
Potassium: 5.7 mmol/L — ABNORMAL HIGH (ref 3.5–5.1)
Sodium: 136 mmol/L (ref 135–145)
TCO2: 26 mmol/L (ref 22–32)
pCO2, Ven: 66 mmHg — ABNORMAL HIGH (ref 44.0–60.0)
pH, Ven: 7.17 — CL (ref 7.250–7.430)
pO2, Ven: 111 mmHg — ABNORMAL HIGH (ref 32.0–45.0)

## 2021-04-20 LAB — TYPE AND SCREEN
ABO/RH(D): O POS
Antibody Screen: NEGATIVE

## 2021-04-20 LAB — RAPID URINE DRUG SCREEN, HOSP PERFORMED
Amphetamines: NOT DETECTED
Barbiturates: NOT DETECTED
Benzodiazepines: NOT DETECTED
Cocaine: NOT DETECTED
Opiates: NOT DETECTED
Tetrahydrocannabinol: NOT DETECTED

## 2021-04-20 LAB — GLUCOSE, CAPILLARY
Glucose-Capillary: 127 mg/dL — ABNORMAL HIGH (ref 70–99)
Glucose-Capillary: 206 mg/dL — ABNORMAL HIGH (ref 70–99)
Glucose-Capillary: 178 mg/dL — ABNORMAL HIGH (ref 70–99)
Glucose-Capillary: 163 mg/dL — ABNORMAL HIGH (ref 70–99)

## 2021-04-20 LAB — BASIC METABOLIC PANEL
Anion gap: 11 (ref 5–15)
BUN: 35 mg/dL — ABNORMAL HIGH (ref 8–23)
CO2: 23 mmol/L (ref 22–32)
Calcium: 9.8 mg/dL (ref 8.9–10.3)
Chloride: 101 mmol/L (ref 98–111)
Creatinine, Ser: 1 mg/dL (ref 0.61–1.24)
GFR, Estimated: >60 mL/min (ref >60)
Glucose, Bld: 107 mg/dL — ABNORMAL HIGH (ref 70–99)
Potassium: 4 mmol/L (ref 3.5–5.1)
Sodium: 135 mmol/L (ref 135–145)

## 2021-04-20 LAB — CBC
HCT: 48.4 % (ref 39.0–52.0)
Hemoglobin: 15.2 g/dL (ref 13.0–17.0)
MCH: 29.1 pg (ref 26.0–34.0)
MCHC: 31.4 g/dL (ref 30.0–36.0)
MCV: 92.7 fL (ref 80.0–100.0)
Platelets: 221 10*3/uL (ref 150–400)
RBC: 5.22 MIL/uL (ref 4.22–5.81)
RDW: 13.9 % (ref 11.5–15.5)
WBC: 9.7 10*3/uL (ref 4.0–10.5)
nRBC: 0 % (ref 0.0–0.2)

## 2021-04-20 SURGERY — POSTERIOR LUMBAR FUSION 1 LEVEL
Anesthesia: General | Site: Spine Lumbar

## 2021-04-20 MED ORDER — ALUM & MAG HYDROXIDE-SIMETH 200-200-20 MG/5ML PO SUSP: 30.0000 mL | Freq: Four times a day (QID) | ORAL | Status: DC | PRN

## 2021-04-20 MED ORDER — METHOCARBAMOL 500 MG PO TABS
500.0000 mg | ORAL_TABLET | Freq: Four times a day (QID) | ORAL | Status: DC | PRN
  Administered 2021-04-20 – 2021-04-21 (×3): 500 mg via ORAL
  Filled 2021-04-20 (×4): qty 1

## 2021-04-20 MED ORDER — FENTANYL CITRATE (PF) 250 MCG/5ML IJ SOLN
INTRAMUSCULAR | Status: AC
  Filled 2021-04-20: qty 5

## 2021-04-20 MED ORDER — ONDANSETRON HCL 4 MG/2ML IJ SOLN
INTRAMUSCULAR | Status: AC
  Filled 2021-04-20: qty 2

## 2021-04-20 MED ORDER — LORATADINE 10 MG PO TABS
10.0000 mg | ORAL_TABLET | Freq: Every day | ORAL | Status: DC
  Administered 2021-04-20 – 2021-04-21 (×2): 10 mg via ORAL
  Filled 2021-04-20 (×2): qty 1

## 2021-04-20 MED ORDER — CEFAZOLIN SODIUM-DEXTROSE 2-3 GM-%(50ML) IV SOLR: INTRAVENOUS | Status: DC | PRN

## 2021-04-20 MED ORDER — ONDANSETRON HCL 4 MG/2ML IJ SOLN: 4.0000 mg | Freq: Four times a day (QID) | INTRAMUSCULAR | Status: DC | PRN

## 2021-04-20 MED ORDER — BACLOFEN 10 MG PO TABS
10.0000 mg | ORAL_TABLET | Freq: Two times a day (BID) | ORAL | Status: DC
  Administered 2021-04-20 – 2021-04-21 (×3): 10 mg via ORAL
  Filled 2021-04-20 (×3): qty 1

## 2021-04-20 MED ORDER — PHENOL 1.4 % MT LIQD: 1.0000 | OROMUCOSAL | Status: DC | PRN

## 2021-04-20 MED ORDER — LACTATED RINGERS IV SOLN: INTRAVENOUS | Status: DC

## 2021-04-20 MED ORDER — SUGAMMADEX SODIUM 500 MG/5ML IV SOLN
INTRAVENOUS | Status: AC
  Filled 2021-04-20: qty 5

## 2021-04-20 MED ORDER — ACETAMINOPHEN 10 MG/ML IV SOLN
INTRAVENOUS | Status: AC
  Filled 2021-04-20: qty 100

## 2021-04-20 MED ORDER — PROPOFOL 10 MG/ML IV BOLUS
INTRAVENOUS | Status: DC | PRN
  Administered 2021-04-20: 160 mg via INTRAVENOUS

## 2021-04-20 MED ORDER — LIDOCAINE 2% (20 MG/ML) 5 ML SYRINGE
INTRAMUSCULAR | Status: DC | PRN
  Administered 2021-04-20: 80 mg via INTRAVENOUS

## 2021-04-20 MED ORDER — ONDANSETRON HCL 4 MG/2ML IJ SOLN: 4.0000 mg | Freq: Once | INTRAMUSCULAR | Status: DC | PRN

## 2021-04-20 MED ORDER — OXYCODONE HCL ER 15 MG PO T12A
15.0000 mg | EXTENDED_RELEASE_TABLET | Freq: Two times a day (BID) | ORAL | Status: DC
  Administered 2021-04-20 – 2021-04-21 (×3): 15 mg via ORAL
  Filled 2021-04-20 (×3): qty 1

## 2021-04-20 MED ORDER — BISACODYL 5 MG PO TBEC: 5.0000 mg | DELAYED_RELEASE_TABLET | Freq: Every day | ORAL | Status: DC | PRN

## 2021-04-20 MED ORDER — DEXMEDETOMIDINE (PRECEDEX) IN NS 20 MCG/5ML (4 MCG/ML) IV SYRINGE
PREFILLED_SYRINGE | INTRAVENOUS | Status: AC
  Filled 2021-04-20: qty 10

## 2021-04-20 MED ORDER — ACETAMINOPHEN 325 MG PO TABS
650.0000 mg | ORAL_TABLET | ORAL | Status: DC | PRN
  Administered 2021-04-21: 650 mg via ORAL
  Filled 2021-04-20: qty 2

## 2021-04-20 MED ORDER — FENTANYL CITRATE (PF) 100 MCG/2ML IJ SOLN
INTRAMUSCULAR | Status: DC | PRN
  Administered 2021-04-20 (×3): 100 ug via INTRAVENOUS
  Administered 2021-04-20 (×4): 50 ug via INTRAVENOUS

## 2021-04-20 MED ORDER — ONDANSETRON HCL 4 MG/2ML IJ SOLN
INTRAMUSCULAR | Status: DC | PRN
  Administered 2021-04-20: 4 mg via INTRAVENOUS

## 2021-04-20 MED ORDER — THROMBIN 20000 UNITS EX SOLR
CUTANEOUS | Status: DC | PRN
  Administered 2021-04-20: 20 mL

## 2021-04-20 MED ORDER — MIDAZOLAM HCL 2 MG/2ML IJ SOLN
INTRAMUSCULAR | Status: AC
  Filled 2021-04-20: qty 2

## 2021-04-20 MED ORDER — PANTOPRAZOLE SODIUM 40 MG IV SOLR: 40.0000 mg | Freq: Every day | INTRAVENOUS | Status: DC

## 2021-04-20 MED ORDER — BUPIVACAINE LIPOSOME 1.3 % IJ SUSP
INTRAMUSCULAR | Status: DC | PRN
  Administered 2021-04-20: 20 mL

## 2021-04-20 MED ORDER — BUPIVACAINE LIPOSOME 1.3 % IJ SUSP
INTRAMUSCULAR | Status: AC
  Filled 2021-04-20: qty 20

## 2021-04-20 MED ORDER — INSULIN ASPART 100 UNIT/ML IJ SOLN
0.0000 [IU] | Freq: Three times a day (TID) | INTRAMUSCULAR | Status: DC
  Administered 2021-04-20 – 2021-04-21 (×2): 3 [IU] via SUBCUTANEOUS

## 2021-04-20 MED ORDER — DOCUSATE SODIUM 100 MG PO CAPS
100.0000 mg | ORAL_CAPSULE | Freq: Two times a day (BID) | ORAL | Status: DC
  Administered 2021-04-20 – 2021-04-21 (×3): 100 mg via ORAL
  Filled 2021-04-20 (×3): qty 1

## 2021-04-20 MED ORDER — GABAPENTIN 300 MG PO CAPS
300.0000 mg | ORAL_CAPSULE | Freq: Three times a day (TID) | ORAL | Status: DC
  Administered 2021-04-20 – 2021-04-21 (×3): 300 mg via ORAL
  Filled 2021-04-20 (×3): qty 1

## 2021-04-20 MED ORDER — INSULIN ASPART 100 UNIT/ML IJ SOLN
4.0000 [IU] | Freq: Three times a day (TID) | INTRAMUSCULAR | Status: DC
  Administered 2021-04-20 – 2021-04-21 (×2): 4 [IU] via SUBCUTANEOUS

## 2021-04-20 MED ORDER — ORAL CARE MOUTH RINSE: 15.0000 mL | Freq: Once | OROMUCOSAL | Status: AC

## 2021-04-20 MED ORDER — HYDROMORPHONE HCL 1 MG/ML IJ SOLN
INTRAMUSCULAR | Status: AC
  Filled 2021-04-20: qty 1

## 2021-04-20 MED ORDER — DEXMEDETOMIDINE (PRECEDEX) IN NS 20 MCG/5ML (4 MCG/ML) IV SYRINGE
PREFILLED_SYRINGE | INTRAVENOUS | Status: DC | PRN
  Administered 2021-04-20: 20 ug via INTRAVENOUS

## 2021-04-20 MED ORDER — SODIUM CHLORIDE 0.9 % IV SOLN: INTRAVENOUS | Status: DC

## 2021-04-20 MED ORDER — PHENYLEPHRINE HCL-NACL 20-0.9 MG/250ML-% IV SOLN
INTRAVENOUS | Status: DC | PRN
  Administered 2021-04-20: 20 ug/min via INTRAVENOUS

## 2021-04-20 MED ORDER — BUPIVACAINE HCL 0.5 % IJ SOLN
INTRAMUSCULAR | Status: DC | PRN
  Administered 2021-04-20: 20 mL

## 2021-04-20 MED ORDER — THROMBIN (RECOMBINANT) 20000 UNITS EX SOLR
CUTANEOUS | Status: AC
  Filled 2021-04-20: qty 20000

## 2021-04-20 MED ORDER — MORPHINE SULFATE (PF) 2 MG/ML IV SOLN: 1.0000 mg | INTRAVENOUS | Status: DC | PRN

## 2021-04-20 MED ORDER — ACETAMINOPHEN 650 MG RE SUPP: 650.0000 mg | RECTAL | Status: DC | PRN

## 2021-04-20 MED ORDER — OXYCODONE HCL 5 MG PO TABS: 5.0000 mg | ORAL_TABLET | ORAL | Status: DC | PRN

## 2021-04-20 MED ORDER — CHLORHEXIDINE GLUCONATE 0.12 % MT SOLN
15.0000 mL | Freq: Once | OROMUCOSAL | Status: AC
  Administered 2021-04-20: 15 mL via OROMUCOSAL
  Filled 2021-04-20: qty 15

## 2021-04-20 MED ORDER — PANTOPRAZOLE SODIUM 40 MG PO TBEC
40.0000 mg | DELAYED_RELEASE_TABLET | Freq: Every day | ORAL | Status: DC
  Administered 2021-04-20: 40 mg via ORAL

## 2021-04-20 MED ORDER — ROCURONIUM BROMIDE 10 MG/ML (PF) SYRINGE
PREFILLED_SYRINGE | INTRAVENOUS | Status: AC
  Filled 2021-04-20: qty 20

## 2021-04-20 MED ORDER — MIDAZOLAM HCL 5 MG/5ML IJ SOLN
INTRAMUSCULAR | Status: DC | PRN
Start: 2021-04-20 — End: 2021-04-20
  Administered 2021-04-20: 2 mg via INTRAVENOUS

## 2021-04-20 MED ORDER — BUPIVACAINE LIPOSOME 1.3 % IJ SUSP: 10.0000 mL | Freq: Once | INTRAMUSCULAR | Status: DC

## 2021-04-20 MED ORDER — HEMOSTATIC AGENTS (NO CHARGE) OPTIME
TOPICAL | Status: DC | PRN
  Administered 2021-04-20: 1

## 2021-04-20 MED ORDER — 0.9 % SODIUM CHLORIDE (POUR BTL) OPTIME
TOPICAL | Status: DC | PRN
  Administered 2021-04-20: 1000 mL

## 2021-04-20 MED ORDER — SODIUM CHLORIDE 0.9% FLUSH
3.0000 mL | Freq: Two times a day (BID) | INTRAVENOUS | Status: DC
  Administered 2021-04-20 (×2): 3 mL via INTRAVENOUS

## 2021-04-20 MED ORDER — SODIUM CHLORIDE 0.9% FLUSH: 3.0000 mL | INTRAVENOUS | Status: DC | PRN

## 2021-04-20 MED ORDER — CEFAZOLIN SODIUM-DEXTROSE 2-4 GM/100ML-% IV SOLN
2.0000 g | Freq: Three times a day (TID) | INTRAVENOUS | Status: AC
  Administered 2021-04-20 – 2021-04-21 (×2): 2 g via INTRAVENOUS
  Filled 2021-04-20 (×2): qty 100

## 2021-04-20 MED ORDER — INSULIN GLARGINE-YFGN 100 UNIT/ML ~~LOC~~ SOLN
34.0000 [IU] | Freq: Every day | SUBCUTANEOUS | Status: DC
  Administered 2021-04-20: 34 [IU] via SUBCUTANEOUS
  Filled 2021-04-20 (×2): qty 0.34

## 2021-04-20 MED ORDER — ONDANSETRON HCL 4 MG PO TABS: 4.0000 mg | ORAL_TABLET | Freq: Four times a day (QID) | ORAL | Status: DC | PRN

## 2021-04-20 MED ORDER — CEFAZOLIN SODIUM 1 G IJ SOLR
INTRAMUSCULAR | Status: AC
  Filled 2021-04-20: qty 20

## 2021-04-20 MED ORDER — ROCURONIUM BROMIDE 10 MG/ML (PF) SYRINGE
PREFILLED_SYRINGE | INTRAVENOUS | Status: DC | PRN
  Administered 2021-04-20: 40 mg via INTRAVENOUS
  Administered 2021-04-20: 30 mg via INTRAVENOUS
  Administered 2021-04-20: 10 mg via INTRAVENOUS
  Administered 2021-04-20: 70 mg via INTRAVENOUS

## 2021-04-20 MED ORDER — DEXAMETHASONE SODIUM PHOSPHATE 10 MG/ML IJ SOLN
INTRAMUSCULAR | Status: AC
  Filled 2021-04-20: qty 1

## 2021-04-20 MED ORDER — HYDROMORPHONE HCL 1 MG/ML IJ SOLN
0.2500 mg | INTRAMUSCULAR | Status: DC | PRN
  Administered 2021-04-20 (×4): 0.5 mg via INTRAVENOUS

## 2021-04-20 MED ORDER — BUPIVACAINE HCL (PF) 0.5 % IJ SOLN
INTRAMUSCULAR | Status: AC
  Filled 2021-04-20: qty 30

## 2021-04-20 MED ORDER — OXYCODONE HCL 5 MG PO TABS
10.0000 mg | ORAL_TABLET | ORAL | Status: DC | PRN
  Administered 2021-04-20 – 2021-04-21 (×4): 10 mg via ORAL
  Filled 2021-04-20 (×4): qty 2

## 2021-04-20 MED ORDER — OMEGA-3-ACID ETHYL ESTERS 1 G PO CAPS
1.0000 g | ORAL_CAPSULE | Freq: Two times a day (BID) | ORAL | Status: DC
  Administered 2021-04-20 – 2021-04-21 (×3): 1 g via ORAL
  Filled 2021-04-20 (×3): qty 1

## 2021-04-20 MED ORDER — ATORVASTATIN CALCIUM 80 MG PO TABS
80.0000 mg | ORAL_TABLET | Freq: Every day | ORAL | Status: DC
  Administered 2021-04-20 – 2021-04-21 (×2): 80 mg via ORAL
  Filled 2021-04-20 (×2): qty 1

## 2021-04-20 MED ORDER — METHOCARBAMOL 1000 MG/10ML IJ SOLN
500.0000 mg | Freq: Four times a day (QID) | INTRAVENOUS | Status: DC | PRN
  Filled 2021-04-20: qty 5

## 2021-04-20 MED ORDER — DEXAMETHASONE SODIUM PHOSPHATE 10 MG/ML IJ SOLN
INTRAMUSCULAR | Status: DC | PRN
  Administered 2021-04-20: 8 mg via INTRAVENOUS

## 2021-04-20 MED ORDER — POLYETHYLENE GLYCOL 3350 17 G PO PACK: 17.0000 g | PACK | Freq: Every day | ORAL | Status: DC | PRN

## 2021-04-20 MED ORDER — SODIUM CHLORIDE 0.9 % IV SOLN: 250.0000 mL | INTRAVENOUS | Status: DC

## 2021-04-20 MED ORDER — FLEET ENEMA 7-19 GM/118ML RE ENEM: 1.0000 | ENEMA | Freq: Once | RECTAL | Status: DC | PRN

## 2021-04-20 MED ORDER — CEFAZOLIN SODIUM-DEXTROSE 2-4 GM/100ML-% IV SOLN
2.0000 g | INTRAVENOUS | Status: AC
  Administered 2021-04-20 (×2): 2 g via INTRAVENOUS
  Filled 2021-04-20: qty 100

## 2021-04-20 MED ORDER — ACETAMINOPHEN 10 MG/ML IV SOLN
1000.0000 mg | Freq: Once | INTRAVENOUS | Status: DC | PRN
  Administered 2021-04-20: 1000 mg via INTRAVENOUS

## 2021-04-20 MED ORDER — MENTHOL 3 MG MT LOZG: 1.0000 | LOZENGE | OROMUCOSAL | Status: DC | PRN

## 2021-04-20 SURGICAL SUPPLY — 78 items
ADH SKN CLS APL DERMABOND .7 (GAUZE/BANDAGES/DRESSINGS) ×1
AGENT HMST MTR 8 SURGIFLO (HEMOSTASIS)
BAG COUNTER SPONGE SURGICOUNT (BAG) ×2 IMPLANT
BAG SPNG CNTER NS LX DISP (BAG) ×1
BIT DRILL NEURO 2X3.1 SFT TUCH (MISCELLANEOUS) IMPLANT
BLADE CLIPPER SURG (BLADE) IMPLANT
BONE CANC CHIPS 20CC PCAN1/4 (Bone Implant) ×2 IMPLANT
BONE VIVIGEN FORMABLE 5.4CC (Bone Implant) ×2 IMPLANT
CAGE LORD X-PAC LT 12 X 28 (Cage) ×1 IMPLANT
CHIPS CANC BONE 20CC PCAN1/4 (Bone Implant) ×1 IMPLANT
COVER BACK TABLE 80X110 HD (DRAPES) ×2 IMPLANT
COVER SURGICAL LIGHT HANDLE (MISCELLANEOUS) ×2 IMPLANT
DECANTER SPIKE VIAL GLASS SM (MISCELLANEOUS) ×1 IMPLANT
DERMABOND ADVANCED (GAUZE/BANDAGES/DRESSINGS) ×1
DERMABOND ADVANCED .7 DNX12 (GAUZE/BANDAGES/DRESSINGS) ×1 IMPLANT
DRAPE C-ARM 42X72 X-RAY (DRAPES) ×2 IMPLANT
DRAPE C-ARMOR (DRAPES) ×2 IMPLANT
DRAPE MICROSCOPE LEICA (MISCELLANEOUS) ×2 IMPLANT
DRAPE POUCH INSTRU U-SHP 10X18 (DRAPES) ×2 IMPLANT
DRAPE SURG 17X23 STRL (DRAPES) ×8 IMPLANT
DRILL NEURO 2X3.1 SOFT TOUCH (MISCELLANEOUS) ×2
DRSG MEPILEX BORDER 4X4 (GAUZE/BANDAGES/DRESSINGS) IMPLANT
DRSG MEPILEX BORDER 4X8 (GAUZE/BANDAGES/DRESSINGS) ×1 IMPLANT
DURAPREP 26ML APPLICATOR (WOUND CARE) ×2 IMPLANT
ELECT BLADE 4.0 EZ CLEAN MEGAD (MISCELLANEOUS) ×2
ELECT BLADE 6.5 EXT (BLADE) IMPLANT
ELECT CAUTERY BLADE 6.4 (BLADE) ×2 IMPLANT
ELECT REM PT RETURN 9FT ADLT (ELECTROSURGICAL) ×2
ELECTRODE BLDE 4.0 EZ CLN MEGD (MISCELLANEOUS) ×1 IMPLANT
ELECTRODE REM PT RTRN 9FT ADLT (ELECTROSURGICAL) ×1 IMPLANT
EVACUATOR 1/8 PVC DRAIN (DRAIN) IMPLANT
GAUZE SPONGE 4X4 12PLY STRL (GAUZE/BANDAGES/DRESSINGS) ×1 IMPLANT
GLOVE SRG 8 PF TXTR STRL LF DI (GLOVE) ×1 IMPLANT
GLOVE SURG 8.5 LATEX PF (GLOVE) ×1 IMPLANT
GLOVE SURG LTX SZ9 (GLOVE) ×2 IMPLANT
GLOVE SURG ORTHO LTX SZ7.5 (GLOVE) ×2 IMPLANT
GLOVE SURG UNDER POLY LF SZ8 (GLOVE) ×2
GOWN STRL REUS W/ TWL LRG LVL3 (GOWN DISPOSABLE) ×1 IMPLANT
GOWN STRL REUS W/TWL 2XL LVL3 (GOWN DISPOSABLE) ×4 IMPLANT
GOWN STRL REUS W/TWL LRG LVL3 (GOWN DISPOSABLE) ×2
GRAFT BNE CANC CHIPS 1-8 20CC (Bone Implant) IMPLANT
GRAFT BNE MATRIX VG FRMBL MD 5 (Bone Implant) IMPLANT
KIT BASIN OR (CUSTOM PROCEDURE TRAY) ×2 IMPLANT
KIT POSITION SURG JACKSON T1 (MISCELLANEOUS) ×2 IMPLANT
KIT TURNOVER KIT B (KITS) ×2 IMPLANT
NDL SPNL 18GX3.5 QUINCKE PK (NEEDLE) ×1 IMPLANT
NEEDLE 22X1 1/2 (OR ONLY) (NEEDLE) ×2 IMPLANT
NEEDLE SPNL 18GX3.5 QUINCKE PK (NEEDLE) ×2 IMPLANT
NS IRRIG 1000ML POUR BTL (IV SOLUTION) ×2 IMPLANT
PACK LAMINECTOMY ORTHO (CUSTOM PROCEDURE TRAY) ×2 IMPLANT
PAD ARMBOARD 7.5X6 YLW CONV (MISCELLANEOUS) ×4 IMPLANT
PATTIES SURGICAL .75X.75 (GAUZE/BANDAGES/DRESSINGS) ×2 IMPLANT
PATTIES SURGICAL 1X1 (DISPOSABLE) ×2 IMPLANT
ROD PRE BENT EXP 40MM (Rod) ×2 IMPLANT
SCREW CORT FIX FEN 5.5X7X55MM (Screw) ×2 IMPLANT
SCREW SET SINGLE INNER (Screw) ×4 IMPLANT
SCREW VIPER 7X50MM (Screw) ×2 IMPLANT
SPOGE SURGIFLO 8M (HEMOSTASIS)
SPONGE SURGIFLO 8M (HEMOSTASIS) IMPLANT
SPONGE SURGIFOAM ABS GEL 100 (HEMOSTASIS) ×2 IMPLANT
SUT VIC AB 0 CT1 27 (SUTURE) ×2
SUT VIC AB 0 CT1 27XBRD ANBCTR (SUTURE) ×1 IMPLANT
SUT VIC AB 1 CTX 36 (SUTURE) ×2
SUT VIC AB 1 CTX36XBRD ANBCTR (SUTURE) ×2 IMPLANT
SUT VIC AB 2-0 CT1 27 (SUTURE) ×2
SUT VIC AB 2-0 CT1 TAPERPNT 27 (SUTURE) ×1 IMPLANT
SUT VIC AB 3-0 X1 27 (SUTURE) ×2 IMPLANT
SYR 20ML LL LF (SYRINGE) ×2 IMPLANT
SYR CONTROL 10ML LL (SYRINGE) ×4 IMPLANT
TAP CANN VIPER2 DL 5.0 (TAP) ×1 IMPLANT
TAP CANN VIPER2 DL 6.0 (TAP) ×1 IMPLANT
TAP CANN VIPER2 DL 7.0 (TAP) ×1 IMPLANT
TAP VIPER MIS 4.35MM (TAP) ×1 IMPLANT
TOWEL GREEN STERILE (TOWEL DISPOSABLE) ×2 IMPLANT
TOWEL GREEN STERILE FF (TOWEL DISPOSABLE) ×2 IMPLANT
TRAY FOLEY MTR SLVR 16FR STAT (SET/KITS/TRAYS/PACK) ×2 IMPLANT
WATER STERILE IRR 1000ML POUR (IV SOLUTION) ×1 IMPLANT
YANKAUER SUCT BULB TIP NO VENT (SUCTIONS) ×2 IMPLANT

## 2021-04-20 NOTE — Progress Notes (Signed)
Orthopedic Tech Progress Note Patient Details:  Ryan Farmer 05-Jul-1956 [id number]  Patient ID: Ryan Farmer, male   DOB: August 23, 1955, 65 y.o.   MRN: [id number] I dropped off aspen lumbar brace.   Ryan Farmer 04/20/2021, 10:51 PM

## 2021-04-20 NOTE — H&P (Signed)
PREOPERATIVE H&P  Chief Complaint: L3-4 spondylolisthesis with large central disc herniation and severe spinal stenosis  HPI: Ryan Farmer is a 65 y.o. male who presents for preoperative history and physical with a diagnosis of L3-4 spondylolisthesis with large central disc herniation and severe spinal stenosis. Symptoms are rated as moderate to severe, and have been worsening.  This is significantly impairing activities of daily living.  He has elected for surgical management.   Past Medical History:  Diagnosis Date   Arthritis    All over   CAD (coronary artery disease), native coronary artery, minimal by cardiac cath 01/08/19 01/09/2019   Chronic pain syndrome    Diabetes mellitus    History of cocaine use    History of kidney stones    Hypercholesteremia    Hypertension    Low back pain    Lumbar radiculopathy    Right leg pain    Seborrheic dermatitis of scalp    Shingles    Past Surgical History:  Procedure Laterality Date   CIRCUMCISION N/A 03/16/2018   Procedure: CIRCUMCISION ADULT WITH PENILE BLOCK;  Surgeon: Alexis Frock, MD;  Location: [facility];  Service: Urology;  Laterality: N/A;  67 MINS   KNEE ARTHROSCOPY Bilateral    LEFT HEART CATH AND CORONARY ANGIOGRAPHY N/A 01/08/2019   Procedure: LEFT HEART CATH AND CORONARY ANGIOGRAPHY;  Surgeon: Troy Sine, MD;  Location: [facility];  Service: Cardiovascular;  Laterality: N/A;   Social History   Socioeconomic History   Marital status: Single    Spouse name: Not on file   Number of children: Not on file   Years of education: Not on file   Highest education level: Not on file  Occupational History   Not on file  Tobacco Use   Smoking status: Every Day    Packs/day: 1.00    Years: 50.00    Pack years: 50.00    Types: Cigarettes   Smokeless tobacco: Former    Types: Nurse, children's Use: Never used  Substance and Sexual Activity   Alcohol use: Not Currently    Comment:  occ   Drug use: Not Currently    Types: Marijuana, Cocaine    Comment: twice a week   Sexual activity: Not on file  Other Topics Concern   Not on file  Social History Narrative   Not on file   Social Determinants of Health   Financial Resource Strain: Not on file  Food Insecurity: Not on file  Transportation Needs: Not on file  Physical Activity: Not on file  Stress: Not on file  Social Connections: Not on file   Family History  Problem Relation Age of Onset   Cancer Mother    Stroke Father    Heart disease Father    Diabetes Father    Not on File Prior to Admission medications   Medication Sig Start Date End Date Taking? Authorizing Provider  acetaminophen (TYLENOL) 500 MG tablet Take 2,500 mg by mouth every 6 (six) hours as needed for mild pain.   Yes [provider]  atorvastatin (LIPITOR) 80 MG tablet Take 1 tablet (80 mg total) by mouth daily. 03/30/21 06/28/21 Yes Duke, Tami Lin, PA  baclofen (LIORESAL) 10 MG tablet TAKE 1 TABLET(10 MG) BY MOUTH TWICE DAILY Patient taking differently: Take 10 mg by mouth 2 (two) times daily. 01/30/21  Yes Jessy Oto, MD  cetirizine (ZYRTEC) 10 MG tablet Take 1 tablet (10  mg total) by mouth daily. 03/31/21  Yes Fenton Foy, NP  diclofenac (VOLTAREN) 50 MG EC tablet TAKE 1 TABLET(50 MG) BY MOUTH TWICE DAILY Patient taking differently: Take 50 mg by mouth 2 (two) times daily. 02/08/21  Yes Jessy Oto, MD  diclofenac Sodium (VOLTAREN) 1 % GEL Apply 4 g topically 4 (four) times daily. Patient taking differently: Apply 4 g topically 4 (four) times daily as needed (pain). 02/26/21  Yes Jessy Oto, MD  gabapentin (NEURONTIN) 300 MG capsule TAKE 1 CAPSULE(300 MG) BY MOUTH THREE TIMES DAILY Patient taking differently: Take 300 mg by mouth 3 (three) times daily. 03/19/21  Yes Jessy Oto, MD  HYDROcodone-acetaminophen (NORCO) 7.5-325 MG tablet Take 1 tablet by mouth every 6 (six) hours as needed for moderate pain. 02/26/21   Yes Jessy Oto, MD  insulin aspart (NOVOLOG FLEXPEN) 100 UNIT/ML FlexPen Inject 12 Units into the skin 3 (three) times daily with meals. 03/26/21  Yes Shamleffer, Melanie Crazier, MD  insulin degludec (TRESIBA FLEXTOUCH) 100 UNIT/ML FlexTouch Pen Inject 34 Units into the skin daily. 03/26/21  Yes Shamleffer, Melanie Crazier, MD  metFORMIN (GLUCOPHAGE) 1000 MG tablet TAKE 1 TABLET(1000 MG) BY MOUTH TWICE DAILY WITH A MEAL Patient taking differently: Take 500 mg by mouth 2 (two) times daily with a meal. 11/09/20  Yes Shamleffer, Melanie Crazier, MD  omega-3 acid ethyl esters (LOVAZA) 1 g capsule Take 1 capsule (1 g total) by mouth 2 (two) times daily. 08/30/19  Yes Almyra Deforest, PA  oxyCODONE-acetaminophen (PERCOCET/ROXICET) 5-325 MG tablet Take 1 tablet by mouth every 6 (six) hours as needed for up to 7 days for severe pain. 04/13/21 04/20/21 Yes Jessy Oto, MD  Blood Glucose Monitoring Suppl (ACCU-CHEK AVIVA PLUS) w/Device KIT 1 Device by Does not apply route 3 (three) times daily after meals. 03/26/21   Shamleffer, Melanie Crazier, MD  dapagliflozin propanediol (FARXIGA) 5 MG TABS tablet Take 1 tablet (5 mg total) by mouth daily before breakfast. 12/24/20   Shamleffer, Melanie Crazier, MD  glucose blood (ACCU-CHEK GUIDE) test strip USE TWICE DAILY AS DIRECTED TO TEST BLOOD SUGAR 11/09/20   Shamleffer, Melanie Crazier, MD  Insulin Pen Needle (BD PEN NEEDLE NANO 2ND GEN) 32G X 4 MM MISC Inject 1 Device into the skin in the morning, at noon, in the evening, and at bedtime. 03/26/21   Shamleffer, Melanie Crazier, MD  Lancets (ACCU-CHEK SOFT TOUCH) lancets Use as instructed to test blood sugar 3 times daily DX E11.42 04/17/19   Shamleffer, Melanie Crazier, MD     Positive ROS: All other systems have been reviewed and were otherwise negative with the exception of those mentioned in the HPI and as above.  Physical Exam: General: Alert, no acute distress Cardiovascular: No pedal edema Respiratory: No cyanosis,  no use of accessory musculature GI: No organomegaly, abdomen is soft and non-tender Skin: No lesions in the area of chief complaint Neurologic: Sensation intact distally Psychiatric: Patient is competent for consent with normal mood and affect Lymphatic: No axillary or cervical lymphadenopathy  MUSCULOSKELETAL: Bilateral EHL weakness 4/5 and foot DF 4/5weakness, bilateral Knee extension weakness 4/5 SLR negative.  Assessment: L3-4 spondylolisthesis with large central disc herniation and severe spinal stenosis  Plan: Plan for Procedure(s): RIGHT L3-4 TRANSFORAMINAL LUMBAR INTERBODY FUSION WITH RODS, SCREWS AND CAGE, LOCAL BONE GRAFT, ALLOGRAFT BONE GRAFT AND VIVIGEN  The risks benefits and alternatives were discussed with the patient including but not limited to the risks of nonoperative treatment, versus surgical intervention  including infection, bleeding, nerve injury,  blood clots, cardiopulmonary complications, morbidity, mortality, among others, and they were willing to proceed.   Basil Dess, MD Cell [phone] Office [phone] 04/20/2021 7:33 AM

## 2021-04-20 NOTE — Transfer of Care (Signed)
Immediate Anesthesia Transfer of Care Note  Patient: Ryan Farmer  Procedure(s) Performed: LEFT LUMBAR THREE -FOUR TRANSFORAMINAL LUMBAR INTERBODY FUSION WITH RODS, SCREWS AND CAGE, LOCAL BONE GRAFT, ALLOGRAFT BONE GRAFT AND VIVIGEN (Spine Lumbar)  Patient Location: PACU  Anesthesia Type:General  Level of Consciousness: awake, alert  and oriented  Airway & Oxygen Therapy: Patient Spontanous Breathing and Patient connected to face mask oxygen  Post-op Assessment: Report given to RN and Post -op Vital signs reviewed and stable  Post vital signs: Reviewed and stable  Last Vitals:  Vitals Value Taken Time  BP 155/78 04/20/21 1229  Temp    Pulse 79 04/20/21 1233  Resp 11 04/20/21 1233  SpO2 94 % 04/20/21 1233  Vitals shown include unvalidated device data.  Last Pain:  Vitals:   04/20/21 0614  TempSrc: Oral  PainSc:       Patients Stated Pain Goal: 3 (54/65/68 1275)  Complications: No notable events documented.

## 2021-04-20 NOTE — Op Note (Addendum)
04/20/2021  1:04 PM  PATIENT:  Ryan Farmer  65 y.o. male  MRN: [id number]  OPERATIVE REPORT  PRE-OPERATIVE DIAGNOSIS:  L3-4 spondylolisthesis with large central disc herniation and severe spinal stenosis  POST-OPERATIVE DIAGNOSIS:  L3-4 spondylolisthesis with large central disc herniation and severe spinal stenosis  PROCEDURE:  Procedure(s): LEFT LUMBAR THREE -FOUR TRANSFORAMINAL LUMBAR INTERBODY FUSION WITH RODS, SCREWS AND CAGE, LOCAL BONE GRAFT, ALLOGRAFT BONE GRAFT AND VIVIGEN    SURGEON:  Jessy Oto, MD     ASSISTANT: None    ANESTHESIA:  General,supplemented with local marcaine 0.5% 1:1 exparel 1.3% total 37 cc, Dr. Kalman Shan.    COMPLICATIONS:  None.  EBL: 300CC  DRAINS: Foley to SD.     COMPONENTS:   Implant Name Type Inv. Item Serial No. Manufacturer Lot No. LRB No. Used Action  BONE VIVIGEN FORMABLE 5.4CC - [id number] Bone Implant BONE VIVIGEN FORMABLE 5.4CC [id number] [employer]  N/A 1 Implanted  BONE [employer] CHIPS 20CC - [id number] Bone Implant BONE [facility] CHIPS 20CC [id number] [employer]  N/A 1 Implanted  SCREW CORT FIX FEN 5.5X7X55MM - [id number] Screw SCREW CORT FIX FEN 5.5X7X55MM  [employer]  N/A 2 Implanted  SCREW VIPER 7X50MM - [id number] Screw SCREW VIPER 7X50MM  [employer] SPINE  N/A 2 Implanted  SCREW SET SINGLE INNER - [id number] Screw SCREW SET SINGLE INNER  [employer]  N/A 4 Implanted  CAGE LORD X-PAC LT 12 X 28 - [id number] Cage CAGE LORD X-PAC LT 12 X 28  [employer] SPINE  N/A 1 Implanted  ROD PRE BENT EXP 40MM - [id number] Rod ROD PRE BENT EXP 40MM  [employer] SPINE  N/A 1 Implanted    PROCEDURE:The patient was met in the holding area, and the appropriate lumbar level Left L3-4  identified and marked with an x and my initials.The patient was then transported to OR. The patient was then placed under general anesthesia without difficulty.The patient received appropriate preoperative  antibiotic prophylaxis ancef.  Nursing staff inserted a Foley catheter under sterile conditions. She was then turned to a prone position [place] spine table was used for this case. All pressure points were well padded PAS stocking applied bilateral lower extremity to prevent DVT. Standard prep DuraPrep solution. Draped in the usual manner. Time-out procedure was called and correct .   The incision was at L3-4 and determined using C-arm to mark the inferior L4 pedicles and  an additionally extended up to the L2 spinous process.   Bovie electric cautery was used to control bleeding and carefully dissection was carried down along the lateral aspects of the spinous process of L2 to L5. Cobb then used to carefully elevate the paralumbar muscle and the incision in the midline was carried to to the level of the base of the residual spinous processes. The posterior exposure area extending from the base of the spinous process of L2 to the superior aspect of L5 was carried expose at its edges debrided the muscle attachments using a Leskell. Time-out procedure was called and correct. Skin in the midline between L2 and L5 was then infiltrated with local anesthesia, marcaine 1/2% 1:1 exparel 1.3% total 30 cc used. Incision was then made  extending from L2-L5  through the skin and subcutaneous layers down to the patient's lumbodorsal fascia and spinous processes. The incision then carried sharply excising the supraspinous ligament and then continuing the lateral aspect of the spinous processes of L3,L4 and L5. Cobb  elevator used to carefully elevate the paralumbar muscles off of the posterior elements using electrocautery carefully drilled bleeding and perform dissection of the muscle tissues of the preserving the facet capsule at the L3-4. Continuing the exposure out laterally to expose the lateral margin of the facet joint line at L4-5 and L3-4. Incision was carried in the midline down to the L5 level area bleeders  controlled using electrocautery monopolar electrocautery.    C-arm fluoroscopy was then brought into the field and using C-arm fluoroscopy then a hole made into the medial aspect of the pedicle of L3 using a high speed burr observed in the pedicle using C arm at the 5 oclock position on the left L3 pedicle nerve probe initial entry was determined on fluoroscopy to be good position alignment so that a 4.39m tap was passed to 55 mm within the left L3 pedicle to a depth of nearly 55 mm observed on C-arm fluoroscopy to be beyond the midpoint of the lumbar vertebra and then position alignment within the left L3 pedicle this was then removed and the pedicle channel probed demonstrating patency no sign of rupture the cortex of the pedicle. Tapping with a 4.35 mm screw tap then 5 mm tap then a 6 mm and 768mtap a 7.0 mm x 55 mm screw was reserved for later placement on the left side pedicle at the L3 level. C-arm fluoroscopy was then brought into the field and using C-arm fluoroscopy then a hole made into the posterior medial aspect of the pedicle of right L3 observed in the pedicle using ball tipped nerve hook and hockey stick nerve probe initial entry was determined on fluoroscopy to be good position alignment so that 4.28m16map was then used to tap the right L3 pedicle to a depth of nearly 55 mm observed on C-arm fluoroscopy to be beyond the midpoint of the lumbar vertebra and then position alignment within the right L3 pedicle this was then removed and the pedicle channel probed demonstrating patency no sign of rupture the cortex of the pedicle. Tapping with a 5 mm screw tap then tapping with a 6.0 mm, a 7.0 mm x 55 mm screw was placed.C-arm fluoroscopy was then brought into the field and using C-arm fluoroscopy then a hole made into the posterior and medial aspect of the left pedicle of L4 observed in the pedicle using ball tipped nerve hook and hockey stick nerve probe initial entry was determined on fluoroscopy to be  good position alignment so that a 4.35 mm tap was then used to tap the left L4 pedicle to a depth of nearly 50 mm observed on C-arm fluoroscopy to be beyond the posterior one third of the lumbar vertebra and good position alignment within the left L5 pedicle this was then removed and the pedicle channel probed demonstrating patency no sign of rupture the cortex of the pedicle. Tapping with a 4.0 mm screw tap then a 5.0 mm tap then tapping with a 6.0 mm and 7.28mm76mp a 7.28mm 428m0 mm screw was saved for later placement on the left side at the L4 level. The pedicle channel of L4 on the left probed demonstrating patency no sign of rupture the cortex of the pedicle.C-arm fluoroscopy was then brought into the field and using C-arm fluoroscopy then a hole made into the posterior and medial aspect of the right pedicle of L4 observed in the pedicle using ball tipped nerve hook and hockey stick nerve probe initial entry was determined on fluoroscopy  to be good position alignment so that a 4.85m tap was then used to tap the right L pedicle to a depth of nearly 50 mm observed on C-arm fluoroscopy to be beyond the posterior one third of the lumbar vertebra and good position alignment within the right L4 pedicle this was then removed and the pedicle channel probed demonstrating patency no sign of rupture the cortex of the pedicle. Tapping with a 4.315mscrew tap then up to a 57m657map then 7.0mm1m50 mm screw was placed on the right side at the L4 level. The pedicle channel of L4 on the right probed demonstrating patency no sign of rupture the cortex of the pedicle. Viper screw for fixation of this level was measured as 7.0 mm x 50 mm screw inserted.  Flow Seal was used for hemostasis of the left L4 and L5 pedicle screw holes.   The lateral spinous process and inferior lamina of left L3 then resected superiorly to the insertion of the ligamentum flavum. Leksell rongeur used to resect inferior aspect of the lamina on the left  side at the L3 level the left medial facets of L3-4 were resected in order to decompress the left and right side of the lumbar thecal sac at L3-4 and decompress the bilateral  L4 and L3 neuroforamen. Osteotomes and 2mm 71m 3mm k63misons were used for this portion of the decompression. The  left side decompression was carried out but  Near complete facetectomy was perform on the left at L3-4 to provide for exposure of the left side L3-4 neuroforamen for ease of placement of TLIF (transforaminal lumbar interbody fusion) at the L3 level inferior portions of the lamina and pars were also resected first beginning with the Leksell rongeur and osteotomes and then resecting using 2 and 3 mm Kerrison. Continued laminectomy was carried out resecting the central portions of the lamina of L3 and upper L4 performing foraminotomies on the left side at the L                                The inferior articular process  L4 was resected on the right side.  A large amount of hypertrophic ligmentum flavum was found impressing on the right lateral recesses at L4-5 and narrowing the respective L4 and L5 neuroforamen.  Loupe magnification and headlight were used during this portion procedure. Then the operating room microscope sterilely draped and brought into the field.  Attention then turned to placement of the transforaminal lumbar interbody fusion cage.  Bleeding controlled using bipolar electrocautery thrombin soaked gel cottonoids. Then turned to the left L3-4 level the exposure the posterior lateral aspect this was carried out using a Penfield 4 bipolar electrocautery to control small bleeders present. Derricho retractor used to retract the thecal sac and L4 nerve root a 15 blade scalpel was used to incise posterior lateral aspect of the left L4-5 disc the disc space at this level showed a rather severe narrowing posteriorly was more open anteriorly so that an osteotome again was used to resect a small portion the posterior  superior lip of the vertebral body at L5  in order to gain ease of access into the L4-5 disc space. The space was debrided of degenerative disc material using pituitary along root the entire disc space was then debrided of degenerative disc material using pituitary rongeurs curettage down to bleeding bone endplates. 57mm an2m mm shavers were used to debride  the disc space and pituitary ronguers used to remove the loosened debris. This space was then carefully assess using spacers  a 10.72m trial cage provided the best fit, the Depuy adjustable cage 942mx 2839mas chosen so that the permanent adjustable 9.0 mm cage by 28 mm cage packed with local bone graft and vivigen and cancellous allograft chips were placed into the intervertebral disc space.  Observed on C-arm fluoroscopy to be in good position alignment. The cage at L3-4 was placed anteriorly as best as possible the correct patient's lordosis. The cage was then raised with the inserted screw driver and the LIFT mechanism deployed. The cage was then filled with vivigen bone graft. With this then the transforaminal lumbar interbody fusion portion of the case was completed bleeders were controlled using bipolar electrocautery thrombin-soaked Gelfoam were appropriate.Decortication of the right facet joint carried out at L3-4. These were packed with cancellous local bone graft.  The 2 viper corticofixation screws on the left were each placed and then each fastener carefully aligned  to allow for placement of rods. The left rod was a precontoured 40 mm rod. This was then placed into the pedicle screws on the left extending from L3-L4 each of the caps were carefully placed loosely tightened.Caps onto the left L4 fastener was tightened to 80 foot lbs. Across the right side a precontoured 38m60mtanium rod was placed into the L3 and L4  screw fasteners and the upper cap tightened to 80 foot lbs., compression was obtained on the right side between L3 and L4 compressing  between the fasteners and tightening the screw caps 85 pounds.Copious amounts of saline solution this was done throughout the case.  Hockey stick neuroprobe was used to probe the neuroforamen bilateral L3 and L4 these were determined to be well decompressed. Permanent C-arm images were obtained in AP and lateral plane and oblique planes. Remaining local bone graft was then applied along both lateral posterior lateral region extending from right L3 to L4 facet bed.Gelfoam was then removed spinal canal. The lumbodorsal musculature carefully exam debrided of any devitalized tissue following removal of self retaining retractors were the bleeders were controlled using electrocautery and the area dorsal lumbar muscle were then approximated in the midline with interrupted #1 Vicryl sutures loose the dorsal fascia was reattached to the spinous process of L3  superiorly and L5  inferiorly this was done with #1 Vicryl sutures. Subcutaneous layers then approximated using interrupted 0 Vicryl sutures and 2-0 Vicryl sutures. Skin was closed with a running subcutaneous stitch of 4-0 Vicryl Dermabond was applied then MedPlex bandage. All instrument and sponge counts were correct. The patient was then returned to a supine position on her bed reactivated extubated and returned to the recovery room in satisfactory condition.      JameJessy Oto 04/20/2021,1:04 PM

## 2021-04-20 NOTE — Anesthesia Procedure Notes (Addendum)
Procedure Name: Intubation Date/Time: 04/20/2021 7:57 AM Performed by: Minerva Ends, CRNA Pre-anesthesia Checklist: Patient identified, Emergency Drugs available, Suction available and Patient being monitored Patient Re-evaluated:Patient Re-evaluated prior to induction Oxygen Delivery Method: Circle system utilized Preoxygenation: Pre-oxygenation with 100% oxygen Induction Type: IV induction Ventilation: Mask ventilation without difficulty Laryngoscope Size: Mac and 3 Grade View: Grade I Tube type: Oral Number of attempts: 1 Airway Equipment and Method: Stylet and Oral airway Placement Confirmation: ETT inserted through vocal cords under direct vision, positive ETCO2 and breath sounds checked- equal and bilateral Secured at: 23 cm Tube secured with: Tape Dental Injury: Teeth and Oropharynx as per pre-operative assessment

## 2021-04-20 NOTE — Anesthesia Postprocedure Evaluation (Signed)
Anesthesia Post Note  Patient: Ryan Farmer  Procedure(s) Performed: LEFT LUMBAR THREE -FOUR TRANSFORAMINAL LUMBAR INTERBODY FUSION WITH RODS, SCREWS AND CAGE, LOCAL BONE GRAFT, ALLOGRAFT BONE GRAFT AND VIVIGEN (Spine Lumbar)     Patient location during evaluation: PACU Anesthesia Type: General Level of consciousness: awake and alert Pain management: pain level controlled Vital Signs Assessment: post-procedure vital signs reviewed and stable Respiratory status: spontaneous breathing, nonlabored ventilation, respiratory function stable and patient connected to nasal cannula oxygen Cardiovascular status: blood pressure returned to baseline and stable Postop Assessment: no apparent nausea or vomiting Anesthetic complications: no   No notable events documented.  Last Vitals:  Vitals:   04/20/21 1340 04/20/21 1355  BP: 97/62 (!) 112/57  Pulse: 85 72  Resp: 20 11  Temp:  (!) 36.1 C  SpO2: 98% 100%    Last Pain:  Vitals:   04/20/21 1355  TempSrc:   PainSc: Asleep                 Malkia Nippert S

## 2021-04-20 NOTE — Discharge Instructions (Addendum)

## 2021-04-20 NOTE — Interval H&P Note (Signed)
History and Physical Interval Note:  04/20/2021 7:35 AM  Ryan Farmer  has presented today for surgery, with the diagnosis of L3-4 spondylolisthesis with large central disc herniation and severe spinal stenosis.  The various methods of treatment have been discussed with the patient and family. After consideration of risks, benefits and other options for treatment, the patient has consented to  Procedure(s): RIGHT L3-4 TRANSFORAMINAL LUMBAR INTERBODY FUSION WITH RODS, SCREWS AND CAGE, LOCAL BONE GRAFT, ALLOGRAFT BONE GRAFT AND VIVIGEN (N/A) as a surgical intervention.  The patient's history has been reviewed, patient examined, no change in status, stable for surgery.  I have reviewed the patient's chart and labs.  Questions were answered to the patient's satisfaction.     Basil Dess

## 2021-04-20 NOTE — Brief Op Note (Signed)
04/20/2021  1:01 PM  PATIENT:  Ryan Farmer  65 y.o. male  PRE-OPERATIVE DIAGNOSIS:  L3-4 spondylolisthesis with large central disc herniation and severe spinal stenosis  POST-OPERATIVE DIAGNOSIS:  L3-4 spondylolisthesis with large central disc herniation and severe spinal stenosis  PROCEDURE:  Procedure(s): LEFT LUMBAR THREE -FOUR TRANSFORAMINAL LUMBAR INTERBODY FUSION WITH RODS, SCREWS AND CAGE, LOCAL BONE GRAFT, ALLOGRAFT BONE GRAFT AND VIVIGEN (N/A)  SURGEON:  Surgeon(s) and Role:    * Jessy Oto, MD - Primary   ASSISTANTS: none   ANESTHESIA:   local and general  EBL:  200 mL   BLOOD ADMINISTERED:none  DRAINS: Urinary Catheter (Foley)   LOCAL MEDICATIONS USED:  MARCAINE 0.5% 1:1 EXPAREL 1.3% Amount: 40 ml  SPECIMEN:  No Specimen  DISPOSITION OF SPECIMEN:  N/A  COUNTS:  YES  TOURNIQUET:  * No tourniquets in log *  DICTATION: .Dragon Dictation  PLAN OF CARE: Admit to inpatient   PATIENT DISPOSITION:  PACU - hemodynamically stable.   Delay start of Pharmacological VTE agent (>24hrs) due to surgical blood loss or risk of bleeding: yes

## 2021-04-20 NOTE — Progress Notes (Signed)
Patient seen in the preop holding area and complains of left leg pain, previously he had right leg pain but the pain is in both legs to different degrees sometime worse on the right and sometimes worse on the left. There is bilateral nerve compression due to severe spinal stenosis at L3-4. Consent changed to reflect change of side of pain and change of side for the TLIF.

## 2021-04-21 DIAGNOSIS — M48061 Spinal stenosis, lumbar region without neurogenic claudication: Secondary | ICD-10-CM | POA: Diagnosis not present

## 2021-04-21 DIAGNOSIS — M4316 Spondylolisthesis, lumbar region: Secondary | ICD-10-CM | POA: Diagnosis not present

## 2021-04-21 DIAGNOSIS — I10 Essential (primary) hypertension: Secondary | ICD-10-CM | POA: Diagnosis not present

## 2021-04-21 DIAGNOSIS — I2510 Atherosclerotic heart disease of native coronary artery without angina pectoris: Secondary | ICD-10-CM | POA: Diagnosis not present

## 2021-04-21 DIAGNOSIS — M5126 Other intervertebral disc displacement, lumbar region: Secondary | ICD-10-CM | POA: Diagnosis not present

## 2021-04-21 DIAGNOSIS — M4807 Spinal stenosis, lumbosacral region: Secondary | ICD-10-CM | POA: Diagnosis present

## 2021-04-21 DIAGNOSIS — E119 Type 2 diabetes mellitus without complications: Secondary | ICD-10-CM | POA: Diagnosis not present

## 2021-04-21 LAB — CBC
HCT: 43 % (ref 39.0–52.0)
Hemoglobin: 13.6 g/dL (ref 13.0–17.0)
MCH: 28.8 pg (ref 26.0–34.0)
MCHC: 31.6 g/dL (ref 30.0–36.0)
MCV: 90.9 fL (ref 80.0–100.0)
Platelets: 199 10*3/uL (ref 150–400)
RBC: 4.73 MIL/uL (ref 4.22–5.81)
RDW: 13.9 % (ref 11.5–15.5)
WBC: 11.4 10*3/uL — ABNORMAL HIGH (ref 4.0–10.5)
nRBC: 0 % (ref 0.0–0.2)

## 2021-04-21 LAB — BASIC METABOLIC PANEL
Anion gap: 11 (ref 5–15)
BUN: 30 mg/dL — ABNORMAL HIGH (ref 8–23)
CO2: 22 mmol/L (ref 22–32)
Calcium: 9 mg/dL (ref 8.9–10.3)
Chloride: 102 mmol/L (ref 98–111)
Creatinine, Ser: 1.11 mg/dL (ref 0.61–1.24)
GFR, Estimated: >60 mL/min (ref >60)
Glucose, Bld: 155 mg/dL — ABNORMAL HIGH (ref 70–99)
Potassium: 4.2 mmol/L (ref 3.5–5.1)
Sodium: 135 mmol/L (ref 135–145)

## 2021-04-21 LAB — GLUCOSE, CAPILLARY: Glucose-Capillary: 172 mg/dL — ABNORMAL HIGH (ref 70–99)

## 2021-04-21 MED ORDER — OXYCODONE HCL 5 MG PO TABS: 5.0000 mg | ORAL_TABLET | ORAL | 0 refills | Status: DC | PRN

## 2021-04-21 MED ORDER — DOCUSATE SODIUM 100 MG PO CAPS: 100.0000 mg | ORAL_CAPSULE | Freq: Two times a day (BID) | ORAL | 0 refills | Status: DC

## 2021-04-21 MED ORDER — OXYCODONE HCL ER 15 MG PO T12A: 15.0000 mg | EXTENDED_RELEASE_TABLET | Freq: Two times a day (BID) | ORAL | 0 refills | Status: DC

## 2021-04-21 MED ORDER — METHOCARBAMOL 500 MG PO TABS: 500.0000 mg | ORAL_TABLET | Freq: Three times a day (TID) | ORAL | 1 refills | Status: DC | PRN

## 2021-04-21 MED FILL — Thrombin (Recombinant) For Soln 20000 Unit: CUTANEOUS | Qty: 1 | Status: AC

## 2021-04-21 NOTE — Plan of Care (Signed)
  Problem: Education: Goal: Ability to verbalize activity precautions or restrictions will improve Outcome: Completed/Met Goal: Knowledge of the prescribed therapeutic regimen will improve Outcome: Completed/Met Goal: Understanding of discharge needs will improve Outcome: Completed/Met   Problem: Bowel/Gastric: Goal: Gastrointestinal status for postoperative course will improve Outcome: Completed/Met   Problem: Clinical Measurements: Goal: Ability to maintain clinical measurements within normal limits will improve Outcome: Completed/Met Goal: Postoperative complications will be avoided or minimized Outcome: Completed/Met Goal: Diagnostic test results will improve Outcome: Completed/Met   Problem: Pain Management: Goal: Pain level will decrease Outcome: Completed/Met   Problem: Health Behavior/Discharge Planning: Goal: Identification of resources available to assist in meeting health care needs will improve Outcome: Completed/Met   Problem: Education: Goal: Ability to verbalize activity precautions or restrictions will improve Outcome: Completed/Met Goal: Knowledge of the prescribed therapeutic regimen will improve Outcome: Completed/Met Goal: Understanding of discharge needs will improve Outcome: Completed/Met   Problem: Activity: Goal: Ability to avoid complications of mobility impairment will improve Outcome: Completed/Met Goal: Ability to tolerate increased activity will improve Outcome: Completed/Met Goal: Will remain free from falls Outcome: Completed/Met   Problem: Bowel/Gastric: Goal: Gastrointestinal status for postoperative course will improve Outcome: Completed/Met   Problem: Clinical Measurements: Goal: Ability to maintain clinical measurements within normal limits will improve Outcome: Completed/Met Goal: Postoperative complications will be avoided or minimized Outcome: Completed/Met Goal: Diagnostic test results will improve Outcome: Completed/Met    Problem: Pain Management: Goal: Pain level will decrease Outcome: Completed/Met   Problem: Skin Integrity: Goal: Will show signs of wound healing Outcome: Completed/Met   Problem: Health Behavior/Discharge Planning: Goal: Identification of resources available to assist in meeting health care needs will improve Outcome: Completed/Met   Problem: Bladder/Genitourinary: Goal: Urinary functional status for postoperative course will improve Outcome: Completed/Met

## 2021-04-21 NOTE — Evaluation (Signed)
Physical Therapy Evaluation and Discharge Patient Details Name: Ryan Farmer MRN: [id number] DOB: 10-Mar-1956 Today's Date: 04/21/2021  History of Present Illness  Ryan Farmer is a 65 y.o. male presents with L3-4 spondylolisthesis with large central disc herniation and severe spinal stenosis. Pt elected to undergo R L3-4 lumbar interbody fusion on 10/12. PMH: CAD, chronic pain syndrome, DM, hx of cocaine use, HTN, low back pain, shingles.  Clinical Impression  Pt admitted s/p R L3-4 lumbar interbody fusion. Pt reports continued RLE radicular pain and numbness from knee to foot. He displays equal strength bilaterally. Ambulating hallway distances with a walker without physical difficulty. He has a left lateral shift without use of walker and mild balance deficits, so recommended assistive device for mobility initially. Education reviewed regarding brace use, activity recommendations, spinal precautions. No further acute or follow up PT needs anticipated. Thank you for this consult.      Recommendations for follow up therapy are one component of a multi-disciplinary discharge planning process, led by the attending physician.  Recommendations may be updated based on patient status, additional functional criteria and insurance authorization.  Follow Up Recommendations No PT follow up;Supervision for mobility/OOB    Equipment Recommendations  Rolling walker with 5" wheels    Recommendations for Other Services       Precautions / Restrictions Precautions Precautions: Fall;Back Precaution Booklet Issued: Yes (comment) Required Braces or Orthoses: Spinal Brace Spinal Brace: Lumbar corset Restrictions Weight Bearing Restrictions: No      Mobility  Bed Mobility Overal bed mobility: Modified Independent             General bed mobility comments: OOB in chair    Transfers Overall transfer level: Modified independent                  Ambulation/Gait Ambulation/Gait  assistance: Modified independent (Device/Increase time) Gait Distance (Feet): 400 Feet Assistive device: Rolling walker (2 wheeled) Gait Pattern/deviations: Step-through pattern;Trunk flexed Gait velocity: decreased   General Gait Details: Good bilateral step length, cues for glute activation and upright posture.  Stairs            Wheelchair Mobility    Modified Rankin (Stroke Patients Only)       Balance Overall balance assessment: Needs assistance Sitting-balance support: No upper extremity supported;Feet supported Sitting balance-Leahy Scale: Good     Standing balance support: No upper extremity supported;During functional activity Standing balance-Leahy Scale: Fair                               Pertinent Vitals/Pain Pain Assessment: Faces Faces Pain Scale: Hurts a little bit Pain Location: low back Pain Descriptors / Indicators: Sore Pain Intervention(s): Monitored during session    Home Living Family/patient expects to be discharged to:: Private residence Living Arrangements: Other relatives (sister) Available Help at Discharge: Family;Available 24 hours/day Type of Home: Mobile home Home Access: Ramped entrance     Home Layout: One level Home Equipment: Hand held shower head;Hospital bed      Prior Function Level of Independence: Independent         Comments: reports no use of AD typically, able to complete ADLs/IADLs though difficult due to increased back pain and LE weakness     Hand Dominance   Dominant Hand: Right    Extremity/Trunk Assessment   Upper Extremity Assessment Upper Extremity Assessment: Overall WFL for tasks assessed    Lower Extremity Assessment Lower Extremity Assessment:  RLE deficits/detail;LLE deficits/detail RLE Deficits / Details: Strength 5/5. pt reports numbness from knee to foot LLE Deficits / Details: Strength 5/5    Cervical / Trunk Assessment Cervical / Trunk Assessment: Other  exceptions Cervical / Trunk Exceptions: s/p spinal surgery, displays left lateral shift, but improves with use of walker and cueing  Communication   Communication: No difficulties  Cognition Arousal/Alertness: Awake/alert Behavior During Therapy: WFL for tasks assessed/performed Overall Cognitive Status: Within Functional Limits for tasks assessed                                        General Comments      Exercises     Assessment/Plan    PT Assessment Patent does not need any further PT services  PT Problem List         PT Treatment Interventions      PT Goals (Current goals can be found in the Care Plan section)  Acute Rehab PT Goals Patient Stated Goal: go home, return to normal activities PT Goal Formulation: All assessment and education complete, DC therapy    Frequency     Barriers to discharge        Co-evaluation               AM-PAC PT "6 Clicks" Mobility  Outcome Measure Help needed turning from your back to your side while in a flat bed without using bedrails?: None Help needed moving from lying on your back to sitting on the side of a flat bed without using bedrails?: None Help needed moving to and from a bed to a chair (including a wheelchair)?: None Help needed standing up from a chair using your arms (e.g., wheelchair or bedside chair)?: None Help needed to walk in hospital room?: None Help needed climbing 3-5 steps with a railing? : A Little 6 Click Score: 23    End of Session Equipment Utilized During Treatment: Back brace Activity Tolerance: Patient tolerated treatment well Patient left: in chair;with call bell/phone within reach Nurse Communication: Mobility status PT Visit Diagnosis: Unsteadiness on feet (R26.81);Pain Pain - part of body:  (back)    Time: 0813-0829 PT Time Calculation (min) (ACUTE ONLY): 16 min   Charges:   PT Evaluation $PT Eval Low Complexity: Independence, PT, DPT Acute  Rehabilitation Services Pager [phone] Office [phone]   Deno Etienne 04/21/2021, 10:05 AM

## 2021-04-21 NOTE — Care Management CC44 (Signed)
Condition Code 44 Documentation Completed  Patient Details  Name: LIAN TANORI MRN: [id number] Date of Birth: Aug 22, 1955   Condition Code 44 given:  Yes Patient signature on Condition Code 44 notice:  Yes Documentation of 2 MD's agreement:  Yes Code 44 added to claim:  Yes    Angelita Ingles, RN 04/21/2021, 3:17 PM

## 2021-04-21 NOTE — Care Management Obs Status (Signed)
[place] NOTIFICATION   Patient Details  Name: Ryan Farmer MRN: [id number] Date of Birth: 09-18-1955   Medicare Observation Status Notification Given:  Yes    Angelita Ingles, RN 04/21/2021, 3:17 PM

## 2021-04-21 NOTE — Progress Notes (Signed)
     Subjective: 1 Day Post-Op Procedure(s) (LRB): LEFT LUMBAR THREE -FOUR TRANSFORAMINAL LUMBAR INTERBODY FUSION WITH RODS, SCREWS AND CAGE, LOCAL BONE GRAFT, ALLOGRAFT BONE GRAFT AND VIVIGEN (N/A) Awake, alert and oriented x 4. Tolerating po meds and nourishment. Meds to go to his pharmacy. Foley removed and he is voiding without difficulty.  No flatus. No leg numbness some aching pain, able to walk with walker in hallway. Patient reports pain as moderate.    Objective:   VITALS:  Temp:  [97 F (36.1 C)-98.9 F (37.2 C)] 98.2 F (36.8 C) (10/12 0446) Pulse Rate:  [66-97] 85 (10/12 0446) Resp:  [9-20] 18 (10/12 0446) BP: (97-156)/(51-81) 156/72 (10/12 0446) SpO2:  [88 %-100 %] 95 % (10/12 0446)  Neurologically intact ABD soft Neurovascular intact Sensation intact distally Intact pulses distally Dorsiflexion/Plantar flexion intact Incision: dressing C/D/I and scant drainage   LABS Recent Labs    04/20/21 0553 04/20/21 1218 04/21/21 0439  HGB 15.2 15.6 13.6  WBC 9.7  --  11.4*  PLT 221  --  199   Recent Labs    04/20/21 0553 04/20/21 1218 04/21/21 0439  NA 135 136 135  K 4.0 5.7* 4.2  CL 101  --  102  CO2 23  --  22  BUN 35*  --  30*  CREATININE 1.00  --  1.11  GLUCOSE 107*  --  155*   No results for input(s): LABPT, INR in the last 72 hours.   Assessment/Plan: 1 Day Post-Op Procedure(s) (LRB): LEFT LUMBAR THREE -FOUR TRANSFORAMINAL LUMBAR INTERBODY FUSION WITH RODS, SCREWS AND CAGE, LOCAL BONE GRAFT, ALLOGRAFT BONE GRAFT AND VIVIGEN (N/A)  Advance diet Up with therapy D/C IV fluids Discharge home with home health  Basil Dess 04/21/2021, 7:10 AM Patient ID: Ryan Farmer, male   DOB: 1955-12-29, 65 y.o.   MRN: [id number]

## 2021-04-21 NOTE — Evaluation (Signed)
Occupational Therapy Evaluation/Discharge Patient Details Name: Ryan Farmer MRN: [id number] DOB: January 27, 1956 Today's Date: 04/21/2021   History of Present Illness Ryan Farmer is a 65 y.o. male presents with L3-4 spondylolisthesis with large central disc herniation and severe spinal stenosis. Pt elected to undergo R L3-4 lumbar interbody fusion on 10/12. PMH: CAD, chronic pain syndrome, DM, hx of cocaine use, HTN, low back pain, shingles.   Clinical Impression   PTA, pt lives with sister and reports Independence with ADLs, IADLs and mobility without AD though increasingly difficult due to back pain/LE weakness. Pt presents now with improving back pain. Educated on spinal precautions for ADLs, IADLs and optimal body mechanics during daily tasks. Pt able to demo the functional skills needed for safe LB ADL completion, brace mgmt, and insight into assistance may needed at home (picking up items from floor if reacher not obtained, grocery shopping, etc). Encouraged use of BSC as shower chair or over lower toilet at home though pt declines needing this DME. Pt open to using RW at home to maximize stability at home initially. No further skilled OT services needed at acute level of on DC.       Recommendations for follow up therapy are one component of a multi-disciplinary discharge planning process, led by the attending physician.  Recommendations may be updated based on patient status, additional functional criteria and insurance authorization.   Follow Up Recommendations  No OT follow up    Equipment Recommendations  3 in 1 bedside commode;Other (comment) (Rolling walker)    Recommendations for Other Services       Precautions / Restrictions Precautions Precautions: Fall;Back Precaution Booklet Issued: Yes (comment) Required Braces or Orthoses: Spinal Brace Spinal Brace: Lumbar corset Restrictions Weight Bearing Restrictions: No      Mobility Bed Mobility Overal bed mobility:  Modified Independent             General bed mobility comments: light use of bed rails, benefits from cues for log rolling    Transfers                      Balance Overall balance assessment: Needs assistance Sitting-balance support: No upper extremity supported;Feet supported Sitting balance-Leahy Scale: Good                                     ADL either performed or assessed with clinical judgement   ADL Overall ADL's : Modified independent                                       General ADL Comments: Educated on spinal precautions for ADLs, strategies for IADLs or assist that may be needed at home (i.e accessing cabinets, picking up items from floor). Pt able to demo functional skills to reach B feet for LB ADLs and return demo brace mgmt. Pt opted to wait to get dressed until after breakfast     Vision Ability to See in Adequate Light: 0 Adequate Patient Visual Report: No change from baseline Vision Assessment?: No apparent visual deficits     Perception     Praxis      Pertinent Vitals/Pain Pain Assessment: Faces Faces Pain Scale: Hurts a little bit Pain Location: low back Pain Descriptors / Indicators: Sore Pain Intervention(s): Monitored during session  Hand Dominance Right   Extremity/Trunk Assessment Upper Extremity Assessment Upper Extremity Assessment: Overall WFL for tasks assessed   Lower Extremity Assessment Lower Extremity Assessment: Defer to PT evaluation   Cervical / Trunk Assessment Cervical / Trunk Assessment: Normal   Communication Communication Communication: No difficulties   Cognition Arousal/Alertness: Awake/alert Behavior During Therapy: WFL for tasks assessed/performed Overall Cognitive Status: Within Functional Limits for tasks assessed                                     General Comments       Exercises     Shoulder Instructions      Home Living  Family/patient expects to be discharged to:: Private residence Living Arrangements: Other relatives (sister) Available Help at Discharge: Family;Available 24 hours/day Type of Home: Mobile home Home Access: Ramped entrance     Home Layout: One level     Bathroom Shower/Tub: Teacher, early years/pre: Standard     Home Equipment: Hand held shower head;Hospital bed          Prior Functioning/Environment Level of Independence: Independent        Comments: reports no use of AD typically, able to complete ADLs/IADLs though difficult due to increased back pain and LE weakness        OT Problem List:        OT Treatment/Interventions:      OT Goals(Current goals can be found in the care plan section) Acute Rehab OT Goals Patient Stated Goal: go home, return to normal activities OT Goal Formulation: All assessment and education complete, DC therapy  OT Frequency:     Barriers to D/C:            Co-evaluation              AM-PAC OT "6 Clicks" Daily Activity     Outcome Measure Help from another person eating meals?: None Help from another person taking care of personal grooming?: None Help from another person toileting, which includes using toliet, bedpan, or urinal?: None Help from another person bathing (including washing, rinsing, drying)?: None Help from another person to put on and taking off regular upper body clothing?: None Help from another person to put on and taking off regular lower body clothing?: None 6 Click Score: 24   End of Session Equipment Utilized During Treatment: Back brace Nurse Communication: Mobility status  Activity Tolerance: Patient tolerated treatment well Patient left: in bed;with call bell/phone within reach (sitting EOB eating breakfast)  OT Visit Diagnosis: Unsteadiness on feet (R26.81);Other abnormalities of gait and mobility (R26.89);Muscle weakness (generalized) (M62.81);Pain Pain - part of body:  (back)                 Time: 4944-9675 OT Time Calculation (Farmer): 13 Farmer Charges:  OT General Charges $OT Visit: 1 Visit OT Evaluation $OT Eval Low Complexity: 1 Low  Malachy Chamber, OTR/L Acute Rehab Services Office: [phone]   Layla Maw 04/21/2021, 7:09 AM

## 2021-04-21 NOTE — Progress Notes (Signed)
Patient was transported via wheelchair to his vehicle by RN for discharge home; in no acute distress nor complaints of pain nor discomfort; incision on his back was clean, dry and intact with a back brace on; room was checked and accounted for all his belongings; discharge instructions concerning his medications, wound care, follow up appointment and when to call the doctor were all discussed with patient by RN and he verbalized understanding on the instructions given.

## 2021-04-22 ENCOUNTER — Telehealth: Payer: Self-pay

## 2021-04-22 ENCOUNTER — Telehealth: Payer: Self-pay | Admitting: Specialist

## 2021-04-22 NOTE — Telephone Encounter (Signed)
From the discharge call:  He said he is feeling pretty good. no questions/concerns  he said he has all medications and did not have any questions about this med regime.   Using RW with ambulation. Wears his back brace when ambulating.    He said he will call for an appointment with Dr Margarita Rana.  Will need to re-establish care as he has not been seen at [facility] in over 2 years.   He called Dr Otho Ket office and is waiting for a call back with appointment time

## 2021-04-22 NOTE — Telephone Encounter (Signed)
Transition Care Management Follow-up Telephone Call Date of discharge and from where: 04/21/2021, [facility]  How have you been since you were released from the hospital? He said he is feeling pretty good.  Any questions or concerns? No  Items Reviewed: Did the pt receive and understand the discharge instructions provided? Yes  Medications obtained and verified? Yes - he said he has all medications and did not have any questions about this med regime.  Other? No  Any new allergies since your discharge? No  Do you have support at home? Yes   Home Care and Equipment/Supplies: Were home health services ordered? no If so, what is the name of the agency? N/a  Has the agency set up a time to come to the patient's home? not applicable Were any new equipment or medical supplies ordered?  No What is the name of the medical supply agency? N/a Were you able to get the supplies/equipment?no Do you have any questions related to the use of the equipment or supplies? No  Has glucometer. Blood sugar 140 today.   Functional Questionnaire: (I = Independent and D = Dependent) ADLs: independent.  Using RW with ambulation. Wears his back brace when ambulating.   Follow up appointments reviewed:  PCP Hospital f/u appt confirmed?  He said he will call for an appointment.  Will need to re-establish care as he has not been seen at [facility] in over 2 years.  [facility] f/u appt confirmed?  He called Dr Otho Ket office and is waiting for a call back with appointment time.    Are transportation arrangements needed? No  If their condition worsens, is the pt aware to call PCP or go to the Emergency Dept.? Yes Was the patient provided with contact information for the PCP's office or ED? Yes Was to pt encouraged to call back with questions or concerns? Yes

## 2021-04-22 NOTE — Telephone Encounter (Signed)
Pt called and had surgery 10/10 and needs a follow up w Nitka but nothings available. He states he does not want to see Jeneen Rinks

## 2021-04-22 NOTE — Telephone Encounter (Signed)
Pt called and had surgery 10/

## 2021-04-27 ENCOUNTER — Other Ambulatory Visit: Payer: Self-pay | Admitting: Specialist

## 2021-04-27 NOTE — Telephone Encounter (Signed)
Patient called. He would like his arthritis cream filled as well as his pain medication.

## 2021-04-27 NOTE — Telephone Encounter (Signed)
Patient called. He would like a refill on percocet. His call back number is [phone]

## 2021-04-28 MED ORDER — DICLOFENAC SODIUM 1 % EX GEL: 4.0000 g | Freq: Four times a day (QID) | CUTANEOUS | 4 refills | Status: DC | PRN

## 2021-04-28 MED ORDER — OXYCODONE HCL 5 MG PO TABS: 5.0000 mg | ORAL_TABLET | ORAL | 0 refills | Status: DC | PRN

## 2021-04-30 ENCOUNTER — Ambulatory Visit: Payer: Medicare Other | Admitting: Internal Medicine

## 2021-04-30 NOTE — Progress Notes (Deleted)
Name: Ryan Farmer  Age/ Sex: 65 y.o., male   MRN/ DOB: [id number], Aug 05, 1955     PCP: Charlott Rakes, MD   Reason for Endocrinology Evaluation: Type 2 Diabetes Mellitus  Initial Endocrine Consultative Visit: 04/15/2019    PATIENT IDENTIFIER: Ryan Farmer is a 65 y.o. male with a past medical history of CAD, T2DM, Hyperlipidemia and polysubstance abuse. The patient has followed with Endocrinology clinic since 04/15/2019 for consultative assistance with management of his diabetes.  DIABETIC HISTORY:  Ryan Farmer was diagnosed with DM in 2011. He has been on SU in the past, has been on metformin since diagnosis, insulin started in 2018. His hemoglobin A1c has ranged from 6.4% in 2017, peaking at 12.6% in 2020.  On his initial visit to our clinic he was on Lantus and Metformin with an A1c 10.4% . We stopped lantus and started a novolog Mix.   Receives intra-articular injections periodically.     Lives with sister, on disability ( can not read or write)      Drinks 12-18 packs a few times a month   SUBJECTIVE:   During the last visit (12/24/2020): A1c 7.8 % . Continued metformin and increased Novolog Mix .      Today (04/30/2021): Ryan Farmer is here for a follow up on diabetes management.   He checks his blood sugars occasionally . The patient has not had hypoglycemic episodes since the last clinic visit.   S/P back sx 04/20/2021  Denies nausea or diarrhea     HOME DIABETES REGIMEN:  Tresiba 34 units daily  Novolog 12 units with each meal  Metformin 1000 mg BID Farxiga 5 mg   METER DOWNLOAD SUMMARY: Unable to download  140-177 mg/dL        DIABETIC COMPLICATIONS: Microvascular complications:  Neuropathy  Denies: CKD , retinopathy  Last eye exam: Completed > 1    Macrovascular complications:  CAD Denies: PVD, CVA      HISTORY:  Past Medical History:  Past Medical History:  Diagnosis Date   Arthritis    All over   CAD (coronary artery  disease), native coronary artery, minimal by cardiac cath 01/08/19 01/09/2019   Chronic pain syndrome    Diabetes mellitus    History of cocaine use    History of kidney stones    Hypercholesteremia    Hypertension    Low back pain    Lumbar radiculopathy    Right leg pain    Seborrheic dermatitis of scalp    Shingles    Past Surgical History:  Past Surgical History:  Procedure Laterality Date   CIRCUMCISION N/A 03/16/2018   Procedure: CIRCUMCISION ADULT WITH PENILE BLOCK;  Surgeon: Alexis Frock, MD;  Location: [facility];  Service: Urology;  Laterality: N/A;  13 MINS   KNEE ARTHROSCOPY Bilateral    LEFT HEART CATH AND CORONARY ANGIOGRAPHY N/A 01/08/2019   Procedure: LEFT HEART CATH AND CORONARY ANGIOGRAPHY;  Surgeon: Troy Sine, MD;  Location: [facility];  Service: Cardiovascular;  Laterality: N/A;   Social History:  reports that he has been smoking cigarettes. He has a 50.00 pack-year smoking history. He has quit using smokeless tobacco.  His smokeless tobacco use included chew. He reports that he does not currently use alcohol. He reports that he does not currently use drugs after having used the following drugs: Marijuana and Cocaine. Family History:  Family History  Problem Relation Age of Onset   Cancer Mother  Stroke Father    Heart disease Father    Diabetes Father      HOME MEDICATIONS: Allergies as of 04/30/2021   Not on File      Medication List        Accurate as of April 30, 2021  7:09 AM. If you have any questions, ask your nurse or doctor.          Accu-Chek Aviva Plus w/Device Kit 1 Device by Does not apply route 3 (three) times daily after meals.   Accu-Chek Guide test strip Generic drug: glucose blood USE TWICE DAILY AS DIRECTED TO TEST BLOOD SUGAR   accu-chek soft touch lancets Use as instructed to test blood sugar 3 times daily DX E11.42   atorvastatin 80 MG tablet Commonly known as: LIPITOR Take 1 tablet  (80 mg total) by mouth daily.   baclofen 10 MG tablet Commonly known as: LIORESAL TAKE 1 TABLET(10 MG) BY MOUTH TWICE DAILY What changed:  how much to take how to take this when to take this additional instructions   BD Pen Needle Nano 2nd Gen 32G X 4 MM Misc Generic drug: Insulin Pen Needle Inject 1 Device into the skin in the morning, at noon, in the evening, and at bedtime.   cetirizine 10 MG tablet Commonly known as: ZYRTEC Take 1 tablet (10 mg total) by mouth daily.   dapagliflozin propanediol 5 MG Tabs tablet Commonly known as: Farxiga Take 1 tablet (5 mg total) by mouth daily before breakfast.   diclofenac Sodium 1 % Gel Commonly known as: VOLTAREN Apply 4 g topically 4 (four) times daily as needed (pain).   docusate sodium 100 MG capsule Commonly known as: COLACE Take 1 capsule (100 mg total) by mouth 2 (two) times daily.   gabapentin 300 MG capsule Commonly known as: NEURONTIN TAKE 1 CAPSULE(300 MG) BY MOUTH THREE TIMES DAILY What changed: See the new instructions.   methocarbamol 500 MG tablet Commonly known as: ROBAXIN Take 1 tablet (500 mg total) by mouth every 8 (eight) hours as needed for muscle spasms.   NovoLOG FlexPen 100 UNIT/ML FlexPen Generic drug: insulin aspart Inject 12 Units into the skin 3 (three) times daily with meals.   omega-3 acid ethyl esters 1 g capsule Commonly known as: Lovaza Take 1 capsule (1 g total) by mouth 2 (two) times daily.   oxyCODONE 15 mg 12 hr tablet Commonly known as: OXYCONTIN Take 1 tablet (15 mg total) by mouth every 12 (twelve) hours.   oxyCODONE 5 MG immediate release tablet Commonly known as: Oxy IR/ROXICODONE Take 1 tablet (5 mg total) by mouth every 4 (four) hours as needed for up to 7 days for breakthrough pain ((score 4 to 6)).   Tyler Aas FlexTouch 100 UNIT/ML FlexTouch Pen Generic drug: insulin degludec Inject 34 Units into the skin daily.         OBJECTIVE:   Vital Signs: There were no vitals  taken for this visit.  Wt Readings from Last 3 Encounters:  04/20/21 217 lb (98.4 kg)  04/02/21 217 lb (98.4 kg)  03/31/21 217 lb (98.4 kg)     Exam: General: Pt appears well and is in NAD  Lungs: Clear with good BS bilat with no rales, rhonchi, or wheezes  Heart: RRR with normal S1 and S2 and no gallops; no murmurs; no rub  Neuro: MS is good with appropriate affect, pt is alert and Ox3      DM foot exam: 02/13/2020   The skin of  the feet is intact without sores or ulcerations. The pedal pulses are 2+ on right and 2+ on left. The sensation is decreased at the right heel to a screening 5.07, 10 gram monofilament    DATA REVIEWED:  Lab Results  Component Value Date   HGBA1C 8.0 (H) 03/25/2021   HGBA1C 7.8 (H) 12/24/2020   HGBA1C 8.8 (A) 08/20/2020        Results for Ryan Farmer, Ryan Farmer (MRN [id number]) as of 04/30/2021 07:10  Ref. Range 04/21/2021 04:39  Sodium Latest Ref Range: 135 - 145 mmol/L 135  Potassium Latest Ref Range: 3.5 - 5.1 mmol/L 4.2  Chloride Latest Ref Range: 98 - 111 mmol/L 102  CO2 Latest Ref Range: 22 - 32 mmol/L 22  Glucose Latest Ref Range: 70 - 99 mg/dL 155 (H)  BUN Latest Ref Range: 8 - 23 mg/dL 30 (H)  Creatinine Latest Ref Range: 0.61 - 1.24 mg/dL 1.11  Calcium Latest Ref Range: 8.9 - 10.3 mg/dL 9.0  Anion gap Latest Ref Range: 5 - 15  11  GFR, Estimated Latest Ref Range: >60 mL/min >60     ASSESSMENT / PLAN / RECOMMENDATIONS:   1) Type 2  Diabetes Mellitus, poorly control, With Neuropathy, and CAD  complications - Most recent A1c of 7.8. Goal A1c < 7.5 %.    - I have manually reviewed his meter and BG's seems to be acceptable  - He is not aware of Wilder Glade, that I started last visit, will resend again. We discussed benefits of SGLT-2  inhibitors as well as cautioned against genital infections    MEDICATIONS: Tresiba 34 units daily  Novolog 12 units TIDQAC Metformin 1000 mg BID  Start Farxiga 5 mg, 1 tablet daily   EDUCATION /  INSTRUCTIONS: BG monitoring instructions: Patient is instructed to check his blood sugars 2 times a day, fasting and supper . Call [facility] if: BG persistently < 70 I reviewed the Rule of 15 for the treatment of hypoglycemia in detail with the patient. Literature supplied.  2) Diabetic complications:  Eye: Does not have known diabetic retinopathy. Neuro/ Feet: Does  have known diabetic peripheral neuropathy. Renal: Patient does not have known baseline CKD. He is on an ACEI/ARB at present   3) Hypertriglyceridemia:    His  TG has been as high as 545 mg/dL in 2015 but this has been trending down to mid 200's. He is not on Atorvastatin, he is not sure when/why he stopped taking it.  -Lipid panel today continues to show LDL above goal and elevated triglycerides, patient will be advised to restart his statin therapy, discussed cardiovascular benefits    Continue atorvastatin 40 mg daily   F/U in 4 months    Signed electronically by: Mack Guise, MD  [facility]  [address] [place], [place] [address] Phone: [phone] FAX: [phone]   CC: Charlott Rakes, MD [place] [address] Phone: [phone]  Fax: [phone]  Return to Endocrinology clinic as below: Future Appointments  Date Time Provider [place]  04/30/2021  7:30 AM Shneur [place], Melanie Crazier, MD [facility] None  05/05/2021  9:30 AM Jessy Oto, MD [facility] None  09/08/2021 11:30 AM Ralene Bathe, MD [facility] None

## 2021-05-05 ENCOUNTER — Encounter: Payer: Self-pay | Admitting: Specialist

## 2021-05-05 ENCOUNTER — Other Ambulatory Visit: Payer: Self-pay

## 2021-05-05 ENCOUNTER — Ambulatory Visit (INDEPENDENT_AMBULATORY_CARE_PROVIDER_SITE_OTHER): Payer: Medicare Other | Admitting: Specialist

## 2021-05-05 VITALS — BP 130/72 | HR 88 | Ht 72.0 in | Wt 217.0 lb

## 2021-05-05 DIAGNOSIS — M4316 Spondylolisthesis, lumbar region: Secondary | ICD-10-CM

## 2021-05-05 DIAGNOSIS — Z981 Arthrodesis status: Secondary | ICD-10-CM

## 2021-05-05 MED ORDER — GABAPENTIN 300 MG PO CAPS: ORAL_CAPSULE | ORAL | 2 refills | Status: DC

## 2021-05-05 NOTE — Patient Instructions (Signed)
Plan: Call if there is increasing drainage, fever greater than 101.5, severe head aches, and worsening nausea or light sensitivity. If shortness of breath, bloody cough or chest tightness or pain go to an emergency room. No lifting greater than 10 lbs. Avoid bending, stooping and twisting. Use brace when sitting and out of bed even to go to bathroom. Walk in house for first 2 weeks then may start to get out slowly increasing distances up to one mile by 4-6 weeks post op. May shower and change dressing following bathing with shower.When bathing remove the brace shower and replace brace before getting out of the shower. If drainage, keep dry dressing and do not bathe the incision, use an moisture impervious dressing. Please call and return for scheduled follow up appointment 2 weeks from the time of surgery.

## 2021-05-05 NOTE — Progress Notes (Signed)
Post-Op Visit Note   Patient: Ryan Farmer           Date of Birth: 26-Dec-1955           MRN: [id number] Visit Date: 05/05/2021 PCP: Charlott Rakes, MD   Assessment & Plan: 2 weeks post of left L3-4 TLIF for spondylolisthesis and stenosis.  Chief Complaint:  Chief Complaint  Patient presents with   Lower Back - Post-op Follow-up  LE motor is normal Hypesthesia left L4 distibution c/w manipulation of left L3 and L4 nerve roots.  Incision is healing without erythrema. Visit Diagnoses:  1. Spondylolisthesis, lumbar region   2. S/P lumbar fusion     Plan:   Call if there is increasing drainage, fever greater than 101.5, severe head aches, and worsening nausea or light sensitivity. If shortness of breath, bloody cough or chest tightness or pain go to an emergency room. No lifting greater than 10 lbs. Avoid bending, stooping and twisting. Use brace when sitting and out of bed even to go to bathroom. Walk in house for first 2 weeks then may start to get out slowly increasing distances up to one mile by 4-6 weeks post op. May shower and change dressing following bathing with shower.When bathing remove the brace shower and replace brace before getting out of the shower. If drainage, keep dry dressing and do not bathe the incision, use an moisture impervious dressing. Please call and return for scheduled follow up appointment 2 weeks from the time of surgery.    Follow-Up Instructions: Return in about 4 weeks (around 06/02/2021).   Orders:  No orders of the defined types were placed in this encounter.  Meds ordered this encounter  Medications   DISCONTD: gabapentin (NEURONTIN) 300 MG capsule    Sig: Take 1 capsule (300 mg total) by mouth 2 (two) times daily AND 2 capsules (600 mg total) at bedtime.    Dispense:  120 capsule    Refill:  2    Imaging: No results found.  PMFS History: Patient Active Problem List   Diagnosis Date Noted   Spinal stenosis of lumbosacral  region 04/21/2021   Spondylolisthesis, lumbar region    Palmar wart 04/05/2021   Acute non-recurrent maxillary sinusitis 04/05/2021   Type 2 diabetes mellitus with hyperglycemia, with long-term current use of insulin ([place]) 08/20/2020   Type 2 diabetes mellitus with microalbuminuria, with long-term current use of insulin ([place]) 10/14/2019   Type 2 diabetes mellitus with diabetic polyneuropathy, with long-term current use of insulin ([facility]) 04/15/2019   Hypertriglyceridemia 04/15/2019   Spinal stenosis of lumbar region with neurogenic claudication 03/07/2019   Lumbar radiculopathy 03/07/2019   Chronic bilateral low back pain with bilateral sciatica 03/07/2019   CAD (coronary artery disease), native coronary artery, minimal by cardiac cath 01/08/19 01/09/2019   Syncope and collapse    Spondylosis without myelopathy or radiculopathy, lumbar region 04/26/2018   Cocaine abuse ([place]) 12/19/2016   Shingles outbreak 12/13/2016   Diabetes type 2, uncontrolled 04/19/2016   Metatarsalgia 10/19/2015   Back muscle spasm 11/18/2013   Hyperlipidemia 11/18/2013   HTN (hypertension) 11/22/2012   Chronic pain syndrome 11/22/2012   Tobacco use disorder 11/22/2012   Dental caries 11/22/2012   Past Medical History:  Diagnosis Date   Arthritis    All over   CAD (coronary artery disease), native coronary artery, minimal by cardiac cath 01/08/19 01/09/2019   Chronic pain syndrome    Diabetes mellitus    History of cocaine use  History of kidney stones    Hypercholesteremia    Hypertension    Low back pain    Lumbar radiculopathy    Right leg pain    Seborrheic dermatitis of scalp    Shingles     Family History  Problem Relation Age of Onset   Cancer Mother    Stroke Father    Heart disease Father    Diabetes Father     Past Surgical History:  Procedure Laterality Date   CIRCUMCISION N/A 03/16/2018   Procedure: CIRCUMCISION ADULT WITH PENILE BLOCK;  Surgeon: Alexis Frock, MD;  Location: [facility];  Service: Urology;  Laterality: N/A;  90 MINS   KNEE ARTHROSCOPY Bilateral    LEFT HEART CATH AND CORONARY ANGIOGRAPHY N/A 01/08/2019   Procedure: LEFT HEART CATH AND CORONARY ANGIOGRAPHY;  Surgeon: Troy Sine, MD;  Location: [facility];  Service: Cardiovascular;  Laterality: N/A;   Social History   Occupational History   Not on file  Tobacco Use   Smoking status: Every Day    Packs/day: 1.00    Years: 50.00    Total pack years: 50.00    Types: Cigarettes   Smokeless tobacco: Former    Types: Nurse, children's Use: Never used  Substance and Sexual Activity   Alcohol use: Not Currently    Comment: occ   Drug use: Not Currently    Types: Marijuana, Cocaine    Comment: twice a week   Sexual activity: Not on file

## 2021-05-07 NOTE — Discharge Summary (Signed)
Patient ID: Ryan Farmer MRN: [id number] DOB/AGE: 1956/06/24 65 y.o.  Admit date: 04/20/2021 Discharge date: 04/21/2021  Admission Diagnoses:  Active Problems:   Spinal stenosis of lumbar region with neurogenic claudication   Spinal stenosis of lumbosacral region   Spondylolisthesis, lumbar region   Discharge Diagnoses:  Active Problems:   Spinal stenosis of lumbar region with neurogenic claudication   Spinal stenosis of lumbosacral region   Spondylolisthesis, lumbar region  status post Procedure(s): LEFT LUMBAR THREE -FOUR TRANSFORAMINAL LUMBAR INTERBODY FUSION WITH RODS, SCREWS AND CAGE, LOCAL BONE GRAFT, ALLOGRAFT BONE GRAFT AND VIVIGEN  Past Medical History:  Diagnosis Date   Arthritis    All over   CAD (coronary artery disease), native coronary artery, minimal by cardiac cath 01/08/19 01/09/2019   Chronic pain syndrome    Diabetes mellitus    History of cocaine use    History of kidney stones    Hypercholesteremia    Hypertension    Low back pain    Lumbar radiculopathy    Right leg pain    Seborrheic dermatitis of scalp    Shingles     Surgeries: Procedure(s): LEFT LUMBAR THREE -FOUR TRANSFORAMINAL LUMBAR INTERBODY FUSION WITH RODS, SCREWS AND CAGE, LOCAL BONE GRAFT, ALLOGRAFT BONE GRAFT AND VIVIGEN on 04/20/2021   Consultants:   Discharged Condition: Improved  Hospital Course: Ryan Farmer is an 65 y.o. male who was admitted 04/20/2021 for operative treatment of lumbar stenosis. . Patient failed conservative treatments (please see the history and physical for the specifics) and had severe unremitting pain that affects sleep, daily activities and work/hobbies. After pre-op clearance, the patient was taken to the operating room on 04/20/2021 and underwent  Procedure(s): LEFT LUMBAR THREE -FOUR TRANSFORAMINAL LUMBAR INTERBODY FUSION WITH RODS, SCREWS AND CAGE, LOCAL BONE GRAFT, ALLOGRAFT BONE GRAFT AND VIVIGEN.    Patient was given perioperative antibiotics:   Anti-infectives (From admission, onward)    Start     Dose/Rate Route Frequency Ordered Stop   04/20/21 1930  ceFAZolin (ANCEF) IVPB 2g/100 mL premix        2 g 200 mL/hr over 30 Minutes Intravenous Every 8 hours 04/20/21 1434 04/21/21 0314   04/20/21 0600  ceFAZolin (ANCEF) IVPB 2g/100 mL premix        2 g 200 mL/hr over 30 Minutes Intravenous On call to O.R. 04/20/21 0549 04/20/21 1137        Patient was given sequential compression devices and early ambulation to prevent DVT.   Patient benefited maximally from hospital stay and there were no complications. At the time of discharge, the patient was urinating/moving their bowels without difficulty, tolerating a regular diet, pain is controlled with oral pain medications and they have been cleared by PT/OT.   Recent vital signs: No data found.   Recent laboratory studies: No results for input(s): WBC, HGB, HCT, PLT, NA, K, CL, CO2, BUN, CREATININE, GLUCOSE, INR, CALCIUM in the last 72 hours.  Invalid input(s): PT, 2   Discharge Medications:   Allergies as of 04/21/2021   Not on File      Medication List     STOP taking these medications    acetaminophen 500 MG tablet Commonly known as: TYLENOL   diclofenac 50 MG EC tablet Commonly known as: VOLTAREN   HYDROcodone-acetaminophen 7.5-325 MG tablet Commonly known as: NORCO   metFORMIN 1000 MG tablet Commonly known as: GLUCOPHAGE   oxyCODONE-acetaminophen 5-325 MG tablet Commonly known as: PERCOCET/ROXICET       TAKE these  medications    Accu-Chek Aviva Plus w/Device Kit 1 Device by Does not apply route 3 (three) times daily after meals.   Accu-Chek Guide test strip Generic drug: glucose blood USE TWICE DAILY AS DIRECTED TO TEST BLOOD SUGAR   accu-chek soft touch lancets Use as instructed to test blood sugar 3 times daily DX E11.42   atorvastatin 80 MG tablet Commonly known as: LIPITOR Take 1 tablet (80 mg total) by mouth daily.   baclofen 10 MG  tablet Commonly known as: LIORESAL TAKE 1 TABLET(10 MG) BY MOUTH TWICE DAILY What changed:  how much to take how to take this when to take this additional instructions   BD Pen Needle Nano 2nd Gen 32G X 4 MM Misc Generic drug: Insulin Pen Needle Inject 1 Device into the skin in the morning, at noon, in the evening, and at bedtime.   cetirizine 10 MG tablet Commonly known as: ZYRTEC Take 1 tablet (10 mg total) by mouth daily.   dapagliflozin propanediol 5 MG Tabs tablet Commonly known as: Farxiga Take 1 tablet (5 mg total) by mouth daily before breakfast.   docusate sodium 100 MG capsule Commonly known as: COLACE Take 1 capsule (100 mg total) by mouth 2 (two) times daily.   methocarbamol 500 MG tablet Commonly known as: ROBAXIN Take 1 tablet (500 mg total) by mouth every 8 (eight) hours as needed for muscle spasms.   NovoLOG FlexPen 100 UNIT/ML FlexPen Generic drug: insulin aspart Inject 12 Units into the skin 3 (three) times daily with meals.   omega-3 acid ethyl esters 1 g capsule Commonly known as: Lovaza Take 1 capsule (1 g total) by mouth 2 (two) times daily.   oxyCODONE 15 mg 12 hr tablet Commonly known as: OXYCONTIN Take 1 tablet (15 mg total) by mouth every 12 (twelve) hours.   Tyler Aas FlexTouch 100 UNIT/ML FlexTouch Pen Generic drug: insulin degludec Inject 34 Units into the skin daily.        Diagnostic Studies: DG Lumbar Spine Complete  Result Date: 04/20/2021 CLINICAL DATA:  L3-L4 TLIF EXAM: LUMBAR SPINE - COMPLETE 4+ VIEW COMPARISON:  MR lumbar spine 03/14/2021 FLUOROSCOPY TIME:  1 minute 41 seconds Dose: 85.26 mGy Images: 6 FINDINGS: Prior MR labeled with 5 lumbar vertebra, current exam labeled accordingly. Images demonstrate placement of a BILATERAL pedicle screws and posterior bars with intervening disc prosthesis at L3-L4. Bones demineralized. No fracture or subluxation.  The IMPRESSION: Posterior fusion L3-L4. Electronically Signed   By: Lavonia Dana M.D.   On: 04/20/2021 14:53   DG C-Arm 1-60 Min-No Report  Result Date: 04/20/2021 Fluoroscopy was utilized by the requesting physician.  No radiographic interpretation.   DG C-Arm 1-60 Min-No Report  Result Date: 04/20/2021 Fluoroscopy was utilized by the requesting physician.  No radiographic interpretation.   DG C-Arm 1-60 Min-No Report  Result Date: 04/20/2021 Fluoroscopy was utilized by the requesting physician.  No radiographic interpretation.    Discharge Instructions     Call MD / Call 911   Complete by: As directed    If you experience chest pain or shortness of breath, CALL 911 and be transported to the hospital emergency room.  If you develope a fever above 101 F, pus (white drainage) or increased drainage or redness at the wound, or calf pain, call your surgeon's office.   Constipation Prevention   Complete by: As directed    Drink plenty of fluids.  Prune juice may be helpful.  You may use a stool softener,  such as Colace (over the counter) 100 mg twice a day.  Use MiraLax (over the counter) for constipation as needed.   Diet - low sodium heart healthy   Complete by: As directed    Discharge instructions   Complete by: As directed    Call if there is increasing drainage, fever greater than 101.5, severe head aches, and worsening nausea or light sensitivity. If shortness of breath, bloody cough or chest tightness or pain go to an emergency room. No lifting greater than 10 lbs. Avoid bending, stooping and twisting. Use brace when sitting and out of bed even to go to bathroom. Walk in house for first 2 weeks then may start to get out slowly increasing distances up to one mile by 4-6 weeks post op. After 5 days may shower and change dressing following bathing with shower.When bathing remove the brace shower and replace brace before getting out of the shower. If drainage, keep dry dressing and do not bathe the incision, use an moisture impervious dressing. Please  call and return for scheduled follow up appointment 2 weeks from the time of surgery.  Do not take metformin for 3 days post op, may resume on Friday 04-13-2021   Driving restrictions   Complete by: As directed    No driving for 6 weeks   Increase activity slowly as tolerated   Complete by: As directed    Lifting restrictions   Complete by: As directed    No lifting for 8 weeks   Post-operative opioid taper instructions:   Complete by: As directed    POST-OPERATIVE OPIOID TAPER INSTRUCTIONS: It is important to wean off of your opioid medication as soon as possible. If you do not need pain medication after your surgery it is ok to stop day one. Opioids include: Codeine, Hydrocodone(Norco, Vicodin), Oxycodone(Percocet, oxycontin) and hydromorphone amongst others.  Long term and even short term use of opiods can cause: Increased pain response Dependence Constipation Depression Respiratory depression And more.  Withdrawal symptoms can include Flu like symptoms Nausea, vomiting And more Techniques to manage these symptoms Hydrate well Eat regular healthy meals Stay active Use relaxation techniques(deep breathing, meditating, yoga) Do Not substitute Alcohol to help with tapering If you have been on opioids for less than two weeks and do not have pain than it is ok to stop all together.  Plan to wean off of opioids This plan should start within one week post op of your joint replacement. Maintain the same interval or time between taking each dose and first decrease the dose.  Cut the total daily intake of opioids by one tablet each day Next start to increase the time between doses. The last dose that should be eliminated is the evening dose.           Follow-up Information     Jessy Oto, MD Follow up in 2 week(s).   Specialty: Orthopedic Surgery Why: For wound re-check Contact information: [place] [address] [phone]                  Discharge Plan:  discharge to home.   Disposition:     Signed: Benjiman Core  05/07/2021, 11:06 AM

## 2021-05-11 ENCOUNTER — Other Ambulatory Visit: Payer: Self-pay | Admitting: Internal Medicine

## 2021-05-11 DIAGNOSIS — E1129 Type 2 diabetes mellitus with other diabetic kidney complication: Secondary | ICD-10-CM

## 2021-05-13 ENCOUNTER — Other Ambulatory Visit: Payer: Self-pay | Admitting: Specialist

## 2021-05-13 NOTE — Telephone Encounter (Signed)
Pt called requesting a refill of oxycodone. Please send to pharmacy on file. Pt phone number is [phone].

## 2021-05-14 MED ORDER — OXYCODONE HCL 5 MG PO TABS: 5.0000 mg | ORAL_TABLET | ORAL | 0 refills | Status: DC | PRN

## 2021-05-16 DIAGNOSIS — N2889 Other specified disorders of kidney and ureter: Secondary | ICD-10-CM | POA: Diagnosis not present

## 2021-05-16 DIAGNOSIS — R809 Proteinuria, unspecified: Secondary | ICD-10-CM | POA: Diagnosis not present

## 2021-05-16 DIAGNOSIS — Z789 Other specified health status: Secondary | ICD-10-CM | POA: Diagnosis not present

## 2021-05-16 DIAGNOSIS — Z79899 Other long term (current) drug therapy: Secondary | ICD-10-CM | POA: Diagnosis not present

## 2021-05-17 ENCOUNTER — Telehealth: Payer: Self-pay | Admitting: Internal Medicine

## 2021-05-17 MED ORDER — ACCU-CHEK GUIDE VI STRP: ORAL_STRIP | 11 refills | Status: DC

## 2021-05-17 NOTE — Telephone Encounter (Signed)
Pt calling in regards to a refills of glucose blood (ACCU-CHEK GUIDE) test strip   [facility] [facility] [address] [employer] [address]  Pt contact [phone]

## 2021-05-17 NOTE — Telephone Encounter (Signed)
Script sent  

## 2021-05-20 ENCOUNTER — Other Ambulatory Visit: Payer: Self-pay

## 2021-05-20 ENCOUNTER — Ambulatory Visit (INDEPENDENT_AMBULATORY_CARE_PROVIDER_SITE_OTHER): Payer: Medicare Other | Admitting: Internal Medicine

## 2021-05-20 VITALS — BP 128/80 | HR 67 | Ht 72.0 in | Wt 224.0 lb

## 2021-05-20 DIAGNOSIS — Z23 Encounter for immunization: Secondary | ICD-10-CM

## 2021-05-20 DIAGNOSIS — E1129 Type 2 diabetes mellitus with other diabetic kidney complication: Secondary | ICD-10-CM | POA: Diagnosis not present

## 2021-05-20 DIAGNOSIS — E1142 Type 2 diabetes mellitus with diabetic polyneuropathy: Secondary | ICD-10-CM

## 2021-05-20 DIAGNOSIS — E785 Hyperlipidemia, unspecified: Secondary | ICD-10-CM | POA: Diagnosis not present

## 2021-05-20 DIAGNOSIS — R809 Proteinuria, unspecified: Secondary | ICD-10-CM

## 2021-05-20 DIAGNOSIS — Z794 Long term (current) use of insulin: Secondary | ICD-10-CM | POA: Diagnosis not present

## 2021-05-20 DIAGNOSIS — E1159 Type 2 diabetes mellitus with other circulatory complications: Secondary | ICD-10-CM | POA: Diagnosis not present

## 2021-05-20 MED ORDER — DAPAGLIFLOZIN PROPANEDIOL 10 MG PO TABS: 10.0000 mg | ORAL_TABLET | Freq: Every day | ORAL | 3 refills | Status: DC

## 2021-05-20 MED ORDER — TRESIBA FLEXTOUCH 100 UNIT/ML ~~LOC~~ SOPN: 40.0000 [IU] | PEN_INJECTOR | Freq: Every day | SUBCUTANEOUS | 3 refills | Status: DC

## 2021-05-20 MED ORDER — NOVOLOG FLEXPEN 100 UNIT/ML ~~LOC~~ SOPN: 15.0000 [IU] | PEN_INJECTOR | Freq: Three times a day (TID) | SUBCUTANEOUS | 6 refills | Status: DC

## 2021-05-20 NOTE — Progress Notes (Signed)
Name: Ryan Farmer  Age/ Sex: 65 y.o., male   MRN/ DOB: [id number], 10-Jul-1956     PCP: Charlott Rakes, MD   Reason for Endocrinology Evaluation: Type 2 Diabetes Mellitus  Initial Endocrine Consultative Visit: 04/15/2019    PATIENT IDENTIFIER: [name] is a 65 y.o. male with a past medical history of CAD, T2DM, Hyperlipidemia and polysubstance abuse. The patient has followed with Endocrinology clinic since 04/15/2019 for consultative assistance with management of his diabetes.  DIABETIC HISTORY:  Ryan Farmer was diagnosed with DM in 2011. He has been on SU in the past, has been on metformin since diagnosis, insulin started in 2018. His hemoglobin A1c has ranged from 6.4% in 2017, peaking at 12.6% in 2020.  On his initial visit to our clinic he was on Lantus and Metformin with an A1c 10.4% . We stopped lantus and started a novolog Mix.   Receives intra-articular injections periodically.     Lives with sister, on disability ( can not read or write)      Drinks 12-18 packs a few times a month   SUBJECTIVE:   During the last visit (12/24/2020): A1c 7.8 % . Continued metformin and increased Novolog Mix .      Today (05/20/2021): Ryan Farmer is here for a follow up on diabetes management.   He checks his blood sugars occasionally . The patient has not had hypoglycemic episodes since the last clinic visit.   S/P back sx 04/20/2021- he is feeling better already  He declined PT  Denies nausea, vomiting  or diarrhea     HOME DIABETES REGIMEN:  Tresiba 34 units daily - 40-50 units daily  Novolog 12 units with each meal - 20-30  Metformin 1000 mg BID Farxiga 5 mg daily      METER DOWNLOAD SUMMARY: Unable to download  87- 330 mg/dL        DIABETIC COMPLICATIONS: Microvascular complications:  Neuropathy  Denies: CKD , retinopathy  Last eye exam: Completed > 1    Macrovascular complications:  CAD Denies: PVD, CVA      HISTORY:  Past Medical History:   Past Medical History:  Diagnosis Date   Arthritis    All over   CAD (coronary artery disease), native coronary artery, minimal by cardiac cath 01/08/19 01/09/2019   Chronic pain syndrome    Diabetes mellitus    History of cocaine use    History of kidney stones    Hypercholesteremia    Hypertension    Low back pain    Lumbar radiculopathy    Right leg pain    Seborrheic dermatitis of scalp    Shingles    Past Surgical History:  Past Surgical History:  Procedure Laterality Date   CIRCUMCISION N/A 03/16/2018   Procedure: CIRCUMCISION ADULT WITH PENILE BLOCK;  Surgeon: Alexis Frock, MD;  Location: [facility];  Service: Urology;  Laterality: N/A;  42 MINS   KNEE ARTHROSCOPY Bilateral    LEFT HEART CATH AND CORONARY ANGIOGRAPHY N/A 01/08/2019   Procedure: LEFT HEART CATH AND CORONARY ANGIOGRAPHY;  Surgeon: Troy Sine, MD;  Location: [facility];  Service: Cardiovascular;  Laterality: N/A;   Social History:  reports that he has been smoking cigarettes. He has a 50.00 pack-year smoking history. He has quit using smokeless tobacco.  His smokeless tobacco use included chew. He reports that he does not currently use alcohol. He reports that he does not currently use drugs after having used the  following drugs: Marijuana and Cocaine. Family History:  Family History  Problem Relation Age of Onset   Cancer Mother    Stroke Father    Heart disease Father    Diabetes Father      HOME MEDICATIONS: Allergies as of 05/20/2021   No Known Allergies      Medication List        Accurate as of May 20, 2021  8:57 AM. If you have any questions, ask your nurse or doctor.          Accu-Chek Aviva Plus w/Device Kit 1 Device by Does not apply route 3 (three) times daily after meals.   Accu-Chek Guide test strip Generic drug: glucose blood USE TWICE DAILY AS DIRECTED TO TEST BLOOD SUGAR   accu-chek soft touch lancets Use as instructed to test blood  sugar 3 times daily DX E11.42   atorvastatin 80 MG tablet Commonly known as: LIPITOR Take 1 tablet (80 mg total) by mouth daily.   baclofen 10 MG tablet Commonly known as: LIORESAL TAKE 1 TABLET(10 MG) BY MOUTH TWICE DAILY What changed:  how much to take how to take this when to take this additional instructions   BD Pen Needle Nano 2nd Gen 32G X 4 MM Misc Generic drug: Insulin Pen Needle Inject 1 Device into the skin in the morning, at noon, in the evening, and at bedtime.   cetirizine 10 MG tablet Commonly known as: ZYRTEC Take 1 tablet (10 mg total) by mouth daily.   dapagliflozin propanediol 10 MG Tabs tablet Commonly known as: Farxiga Take 1 tablet (10 mg total) by mouth daily before breakfast. What changed:  medication strength how much to take Changed by: Dorita Sciara, MD   diclofenac Sodium 1 % Gel Commonly known as: VOLTAREN Apply 4 g topically 4 (four) times daily as needed (pain).   docusate sodium 100 MG capsule Commonly known as: COLACE Take 1 capsule (100 mg total) by mouth 2 (two) times daily.   gabapentin 300 MG capsule Commonly known as: NEURONTIN Take 1 capsule (300 mg total) by mouth 2 (two) times daily AND 2 capsules (600 mg total) at bedtime.   metFORMIN 1000 MG tablet Commonly known as: GLUCOPHAGE TAKE 1 TABLET(1000 MG) BY MOUTH TWICE DAILY WITH A MEAL   methocarbamol 500 MG tablet Commonly known as: ROBAXIN Take 1 tablet (500 mg total) by mouth every 8 (eight) hours as needed for muscle spasms.   NovoLOG FlexPen 100 UNIT/ML FlexPen Generic drug: insulin aspart Inject 15 Units into the skin 3 (three) times daily with meals. What changed: how much to take Changed by: Dorita Sciara, MD   omega-3 acid ethyl esters 1 g capsule Commonly known as: Lovaza Take 1 capsule (1 g total) by mouth 2 (two) times daily.   oxyCODONE 15 mg 12 hr tablet Commonly known as: OXYCONTIN Take 1 tablet (15 mg total) by mouth every 12 (twelve)  hours.   oxyCODONE 5 MG immediate release tablet Commonly known as: Oxy IR/ROXICODONE Take 1 tablet (5 mg total) by mouth every 4 (four) hours as needed for up to 7 days for breakthrough pain ((score 4 to 6)).   Tyler Aas FlexTouch 100 UNIT/ML FlexTouch Pen Generic drug: insulin degludec Inject 40 Units into the skin daily. What changed: how much to take Changed by: Dorita Sciara, MD         OBJECTIVE:   Vital Signs: BP 128/80 (BP Location: Left Arm, Patient Position: Sitting, Cuff Size: Normal)  Pulse 67   Ht 6' (1.829 m)   Wt 224 lb (101.6 kg)   SpO2 97%   BMI 30.38 kg/m   Wt Readings from Last 3 Encounters:  05/20/21 224 lb (101.6 kg)  05/05/21 217 lb (98.4 kg)  04/20/21 217 lb (98.4 kg)     Exam: General: Pt appears well and is in NAD  Lungs: Clear with good BS bilat with no rales, rhonchi, or wheezes  Heart: RRR with normal S1 and S2 and no gallops; no murmurs; no rub  Neuro: MS is good with appropriate affect, pt is alert and Ox3      DM foot exam: 05/20/2021   The skin of the feet is intact without sores or ulcerations. The pedal pulses are 2+ on right and 2+ on left. The sensation is intact at the right heel to a screening 5.07, 10 gram monofilament    DATA REVIEWED:  Lab Results  Component Value Date   HGBA1C 8.0 (H) 03/25/2021   HGBA1C 7.8 (H) 12/24/2020   HGBA1C 8.8 (A) 08/20/2020        Results for Ryan Farmer, Ryan Farmer (MRN [id number]) as of 04/30/2021 07:10  Ref. Range 04/21/2021 04:39  Sodium Latest Ref Range: 135 - 145 mmol/L 135  Potassium Latest Ref Range: 3.5 - 5.1 mmol/L 4.2  Chloride Latest Ref Range: 98 - 111 mmol/L 102  CO2 Latest Ref Range: 22 - 32 mmol/L 22  Glucose Latest Ref Range: 70 - 99 mg/dL 155 (H)  BUN Latest Ref Range: 8 - 23 mg/dL 30 (H)  Creatinine Latest Ref Range: 0.61 - 1.24 mg/dL 1.11  Calcium Latest Ref Range: 8.9 - 10.3 mg/dL 9.0  Anion gap Latest Ref Range: 5 - 15  11  GFR, Estimated Latest Ref Range: >60  mL/min >60     ASSESSMENT / PLAN / RECOMMENDATIONS:   1) Type 2  Diabetes Mellitus, poorly control, With Neuropathy, and CAD  complications - Most recent A1c of 8.0 % . Goal A1c < 7.5 %.    - I have manually reviewed his meter and BG's fluctuate between 82-300 mg/dL  - Pt tends to self adjust insulin doses, pt discouraged from this practice, discussed fatal risk with hypoglycemia  - He is not sure if he is on Farxiga, pt asked to check home bottles and pharmacy   MEDICATIONS: Increase Tresiba 40 units daily  Increase Novolog 15 units TIDQAC Metformin 1000 mg BID  Increase Farxiga 10 mg, 1 tablet daily   EDUCATION / INSTRUCTIONS: BG monitoring instructions: Patient is instructed to check his blood sugars 2 times a day, fasting and supper . Call [facility] if: BG persistently < 70 I reviewed the Rule of 15 for the treatment of hypoglycemia in detail with the patient. Literature supplied.  2) Diabetic complications:  Eye: Does not have known diabetic retinopathy. Neuro/ Feet: Does  have known diabetic peripheral neuropathy. Renal: Patient does not have known baseline CKD. He is on an ACEI/ARB at present   3) Hypertriglyceridemia:    His  TG has been as high as 545 mg/dL in 2015 but this has been trending down to normal. Encouraged compliance with statin intake    Continue atorvastatin 40 mg daily   F/U in 4 months    Signed electronically by: Mack Guise, MD  [facility]  [facility] [address] [place], [place] [address] Phone: [phone] FAX: [phone]   CC: Charlott Rakes, MD [place]  [place] [address] Phone: [phone]  Fax: [phone]  Return to Endocrinology clinic as below: Future Appointments  Date Time Provider [place]  06/09/2021  8:45 AM Jessy Oto, MD [facility] None  09/08/2021 11:30 AM Ralene Bathe, MD [facility] None

## 2021-05-20 NOTE — Patient Instructions (Addendum)
-   Tresiba 40 units daily ( Blue/Green Pen ) - Novolog 15 units with each meal ( Blue/ Orange Pen ) - Continue Metformin 1000 mg twice daily  - Increase Farxiga ( dapagliflizin)  10 mg, 1 tablet daily ( Make sure you have that at home )   HOW TO TREAT LOW BLOOD SUGARS (Blood sugar LESS THAN 70 MG/DL) Please follow the RULE OF 15 for the treatment of hypoglycemia treatment (when your (blood sugars are less than 70 mg/dL)   STEP 1: Take 15 grams of carbohydrates when your blood sugar is low, which includes:  3-4 GLUCOSE TABS  OR 3-4 OZ OF JUICE OR REGULAR SODA OR ONE TUBE OF GLUCOSE GEL    STEP 2: RECHECK blood sugar in 15 MINUTES STEP 3: If your blood sugar is still low at the 15 minute recheck --> then, go back to STEP 1 and treat AGAIN with another 15 grams of carbohydrates.

## 2021-05-22 ENCOUNTER — Other Ambulatory Visit: Payer: Self-pay | Admitting: Specialist

## 2021-05-24 ENCOUNTER — Telehealth: Payer: Self-pay | Admitting: Internal Medicine

## 2021-05-24 NOTE — Telephone Encounter (Signed)
Pt calling in glucose blood (ACCU-CHEK GUIDE) test strip were not covered by his insurance. Needs test strips.  [id number] - [address]  [place], [place] [address]   Pt contact [phone]

## 2021-05-26 MED ORDER — BLOOD GLUCOSE MONITOR KIT: PACK | 0 refills | Status: DC

## 2021-05-26 NOTE — Addendum Note (Signed)
Addended by: Jefferson Fuel on: 05/26/2021 01:43 PM   Modules accepted: Orders

## 2021-05-26 NOTE — Telephone Encounter (Signed)
Patient called again re: Patient is out of test strips (since 05/20/21). Patient states insurance will not cover Accu-Chek Guide Test strips. Patient states he is unable to read or write information re: what brand of test strips insurance will cover. Patient states if he is unable to get test strips for his current meter, he will need a new RX for a new meter and supplies (e.g. test strips) sent to:   [facility] [place], [address] AT [place] Phone:  [phone]  Fax:  [phone]

## 2021-05-26 NOTE — Telephone Encounter (Signed)
Order has been sent in for generic glucose kit for which ever one the insurance will cover. Vm left for patient to callback

## 2021-05-27 DIAGNOSIS — N2889 Other specified disorders of kidney and ureter: Secondary | ICD-10-CM | POA: Diagnosis not present

## 2021-05-28 DIAGNOSIS — M5416 Radiculopathy, lumbar region: Secondary | ICD-10-CM | POA: Diagnosis not present

## 2021-05-28 DIAGNOSIS — N2889 Other specified disorders of kidney and ureter: Secondary | ICD-10-CM | POA: Diagnosis not present

## 2021-05-28 DIAGNOSIS — I10 Essential (primary) hypertension: Secondary | ICD-10-CM | POA: Diagnosis not present

## 2021-05-28 DIAGNOSIS — Z79899 Other long term (current) drug therapy: Secondary | ICD-10-CM | POA: Diagnosis not present

## 2021-05-28 DIAGNOSIS — R809 Proteinuria, unspecified: Secondary | ICD-10-CM | POA: Diagnosis not present

## 2021-06-09 ENCOUNTER — Ambulatory Visit (INDEPENDENT_AMBULATORY_CARE_PROVIDER_SITE_OTHER): Payer: Medicare Other | Admitting: Specialist

## 2021-06-09 ENCOUNTER — Ambulatory Visit (INDEPENDENT_AMBULATORY_CARE_PROVIDER_SITE_OTHER): Payer: Medicare Other

## 2021-06-09 ENCOUNTER — Other Ambulatory Visit: Payer: Self-pay

## 2021-06-09 ENCOUNTER — Encounter: Payer: Self-pay | Admitting: Specialist

## 2021-06-09 VITALS — BP 126/76 | HR 91 | Ht 72.0 in | Wt 224.0 lb

## 2021-06-09 DIAGNOSIS — M4726 Other spondylosis with radiculopathy, lumbar region: Secondary | ICD-10-CM

## 2021-06-09 DIAGNOSIS — Z981 Arthrodesis status: Secondary | ICD-10-CM | POA: Diagnosis not present

## 2021-06-09 DIAGNOSIS — M5136 Other intervertebral disc degeneration, lumbar region: Secondary | ICD-10-CM

## 2021-06-09 NOTE — Patient Instructions (Signed)
    Call if there is increasing drainage, fever greater than 101.5, severe head aches, and worsening nausea or light sensitivity. If shortness of breath, bloody cough or chest tightness or pain go to an emergency room. No lifting greater than 10 lbs. Avoid bending, stooping and twisting. Use brace when sitting and out of bed even to go to bathroom. Walk in house for first 2 weeks then may start to get out slowly increasing distances up to one mile by 4-6 weeks post op. May shower and change dressing following bathing with shower.When bathing remove the brace shower and replace brace before getting out of the shower.  Please call and return for scheduled follow up appointment 4 weeks from the time of surgery.
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
## 2021-06-10 ENCOUNTER — Other Ambulatory Visit: Payer: Self-pay | Admitting: Specialist

## 2021-07-02 DIAGNOSIS — I10 Essential (primary) hypertension: Secondary | ICD-10-CM | POA: Diagnosis not present

## 2021-07-02 DIAGNOSIS — E119 Type 2 diabetes mellitus without complications: Secondary | ICD-10-CM | POA: Diagnosis not present

## 2021-07-02 DIAGNOSIS — N2889 Other specified disorders of kidney and ureter: Secondary | ICD-10-CM | POA: Diagnosis not present

## 2021-07-02 DIAGNOSIS — M5416 Radiculopathy, lumbar region: Secondary | ICD-10-CM | POA: Diagnosis not present

## 2021-07-07 ENCOUNTER — Other Ambulatory Visit: Payer: Self-pay

## 2021-07-07 ENCOUNTER — Ambulatory Visit (INDEPENDENT_AMBULATORY_CARE_PROVIDER_SITE_OTHER): Payer: Medicare Other | Admitting: Specialist

## 2021-07-07 ENCOUNTER — Encounter: Payer: Self-pay | Admitting: Specialist

## 2021-07-07 ENCOUNTER — Ambulatory Visit (INDEPENDENT_AMBULATORY_CARE_PROVIDER_SITE_OTHER): Payer: Medicare Other

## 2021-07-07 VITALS — BP 126/75 | HR 80 | Ht 72.0 in | Wt 224.0 lb

## 2021-07-07 DIAGNOSIS — Z981 Arthrodesis status: Secondary | ICD-10-CM

## 2021-07-07 NOTE — Progress Notes (Signed)
Post-Op Visit Note   Patient: Ryan Farmer           Date of Birth: 27-Jan-1956           MRN: [id number] Visit Date: 07/07/2021 PCP: Charlott Rakes, MD   Assessment & Plan: L3-4 left TLIF Now 11 weeks post op lumbar fusion for spondylolisthesis and stenosis  Chief Complaint:  Chief Complaint  Patient presents with   Lower Back - Routine Post Op  Underwent MRI of kidneys found to have two areas of concern. No weight loss No bowel issue but increased frequency and up several times at night to pass water. Legs motor is normal SLR is negative. Left leg numbness and paresthesia some better. Visit Diagnoses:  1. S/P lumbar fusion     Plan: Avoid frequent bending and stooping  No lifting greater than 10 lbs. May use ice or moist heat for pain. Weight loss is of benefit. Best medication for lumbar disc disease is arthritis medications like motrin, celebrex and naprosyn. Exercise is important to improve your indurance and does allow people to function better inspite of back pain. DIscontinue brace. Continue to undergo work up of the kidneys and consider having a colonoscopy and workup to be sure that the colon is not the cause of some of your ongoing back pain.  Follow-Up Instructions: No follow-ups on file.   Orders:  Orders Placed This Encounter  Procedures   XR Lumbar Spine 2-3 Views   No orders of the defined types were placed in this encounter.   Imaging: No results found.  PMFS History: Patient Active Problem List   Diagnosis Date Noted   Spinal stenosis of lumbosacral region 04/21/2021   Spondylolisthesis, lumbar region    Palmar wart 04/05/2021   Acute non-recurrent maxillary sinusitis 04/05/2021   Type 2 diabetes mellitus with hyperglycemia, with long-term current use of insulin ([place]) 08/20/2020   Type 2 diabetes mellitus with microalbuminuria, with long-term current use of insulin ([place]) 10/14/2019   Type 2 diabetes mellitus with diabetic polyneuropathy,  with long-term current use of insulin ([place]) 04/15/2019   Hypertriglyceridemia 04/15/2019   Spinal stenosis of lumbar region with neurogenic claudication 03/07/2019   Lumbar radiculopathy 03/07/2019   Chronic bilateral low back pain with bilateral sciatica 03/07/2019   CAD (coronary artery disease), native coronary artery, minimal by cardiac cath 01/08/19 01/09/2019   Syncope and collapse    Spondylosis without myelopathy or radiculopathy, lumbar region 04/26/2018   Cocaine abuse ([place]) 12/19/2016   Shingles outbreak 12/13/2016   Diabetes type 2, uncontrolled 04/19/2016   Metatarsalgia 10/19/2015   Back muscle spasm 11/18/2013   Hyperlipidemia 11/18/2013   HTN (hypertension) 11/22/2012   Chronic pain syndrome 11/22/2012   Tobacco use disorder 11/22/2012   Dental caries 11/22/2012   Past Medical History:  Diagnosis Date   Arthritis    All over   CAD (coronary artery disease), native coronary artery, minimal by cardiac cath 01/08/19 01/09/2019   Chronic pain syndrome    Diabetes mellitus    History of cocaine use    History of kidney stones    Hypercholesteremia    Hypertension    Low back pain    Lumbar radiculopathy    Right leg pain    Seborrheic dermatitis of scalp    Shingles     Family History  Problem Relation Age of Onset   Cancer Mother    Stroke Father    Heart disease Father    Diabetes Father  Past Surgical History:  Procedure Laterality Date   CIRCUMCISION N/A 03/16/2018   Procedure: CIRCUMCISION ADULT WITH PENILE BLOCK;  Surgeon: Alexis Frock, MD;  Location: [facility];  Service: Urology;  Laterality: N/A;  60 MINS   KNEE ARTHROSCOPY Bilateral    LEFT HEART CATH AND CORONARY ANGIOGRAPHY N/A 01/08/2019   Procedure: LEFT HEART CATH AND CORONARY ANGIOGRAPHY;  Surgeon: Troy Sine, MD;  Location: [facility];  Service: Cardiovascular;  Laterality: N/A;   Social History   Occupational History   Not on file  Tobacco Use   Smoking  status: Every Day    Packs/day: 1.00    Years: 50.00    Pack years: 50.00    Types: Cigarettes   Smokeless tobacco: Former    Types: Nurse, children's Use: Never used  Substance and Sexual Activity   Alcohol use: Not Currently    Comment: occ   Drug use: Not Currently    Types: Marijuana, Cocaine    Comment: twice a week   Sexual activity: Not on file

## 2021-07-07 NOTE — Patient Instructions (Addendum)
Plan: Avoid frequent bending and stooping  No lifting greater than 10 lbs. May use ice or moist heat for pain. Weight loss is of benefit. Best medication for lumbar disc disease is arthritis medications like motrin, celebrex and naprosyn. Exercise is important to improve your indurance and does allow people to function better inspite of back pain. DIscontinue brace. Continue to undergo work up of the kidneys and consider having a colonoscopy and workup to be sure that the colon is not the cause of some of your ongoing back pain.  Weaar brace for 1/2 days for 2 weeks then discontinue or gradually remove starting with 2 hours per day out of the brace and remove for an additional 2 hours each week. After being out for 8 hours a day you can stop using the brace.

## 2021-07-09 ENCOUNTER — Telehealth: Payer: Self-pay | Admitting: Oncology

## 2021-07-09 NOTE — Telephone Encounter (Signed)
Scheduled appt per 12/23 referral. Pt is aware of appt date and time. Pt is aware to arrive 15 mins prior to appt time.

## 2021-07-26 DIAGNOSIS — N471 Phimosis: Secondary | ICD-10-CM | POA: Diagnosis not present

## 2021-07-26 DIAGNOSIS — D4102 Neoplasm of uncertain behavior of left kidney: Secondary | ICD-10-CM | POA: Diagnosis not present

## 2021-07-26 DIAGNOSIS — R972 Elevated prostate specific antigen [PSA]: Secondary | ICD-10-CM | POA: Diagnosis not present

## 2021-07-27 ENCOUNTER — Other Ambulatory Visit: Payer: Self-pay

## 2021-07-27 ENCOUNTER — Inpatient Hospital Stay: Payer: Medicare Other | Admitting: Oncology

## 2021-07-27 ENCOUNTER — Encounter (HOSPITAL_COMMUNITY): Payer: Self-pay | Admitting: *Deleted

## 2021-07-27 ENCOUNTER — Ambulatory Visit (HOSPITAL_COMMUNITY)
Admission: EM | Admit: 2021-07-27 | Discharge: 2021-07-27 | Disposition: A | Discharge status: Home / Self Care | Payer: Medicare Other | Attending: Urgent Care | Admitting: Urgent Care

## 2021-07-27 DIAGNOSIS — E118 Type 2 diabetes mellitus with unspecified complications: Secondary | ICD-10-CM

## 2021-07-27 DIAGNOSIS — F17200 Nicotine dependence, unspecified, uncomplicated: Secondary | ICD-10-CM

## 2021-07-27 DIAGNOSIS — Z794 Long term (current) use of insulin: Secondary | ICD-10-CM | POA: Diagnosis not present

## 2021-07-27 DIAGNOSIS — J0181 Other acute recurrent sinusitis: Secondary | ICD-10-CM

## 2021-07-27 DIAGNOSIS — E119 Type 2 diabetes mellitus without complications: Secondary | ICD-10-CM

## 2021-07-27 DIAGNOSIS — J3089 Other allergic rhinitis: Secondary | ICD-10-CM | POA: Diagnosis not present

## 2021-07-27 MED ORDER — DOXYCYCLINE HYCLATE 100 MG PO CAPS: 100.0000 mg | ORAL_CAPSULE | Freq: Two times a day (BID) | ORAL | 0 refills | Status: DC

## 2021-07-27 MED ORDER — LEVOCETIRIZINE DIHYDROCHLORIDE 5 MG PO TABS: 5.0000 mg | ORAL_TABLET | Freq: Every evening | ORAL | 0 refills | Status: DC

## 2021-07-27 MED ORDER — PREDNISONE 20 MG PO TABS: ORAL_TABLET | ORAL | 0 refills | Status: DC

## 2021-07-27 NOTE — ED Provider Notes (Signed)
[place]   MRN: [id number] DOB: [birth year]  Subjective:   Ryan Farmer is a 66 y.o. male presenting for 2 week history of persistent sinus congestion, sneezing, facial pressure, head cold.  Last time he had a sinus infection was about 2 to 3 months ago.  He was prescribed amoxicillin for 10 days and finished the entire course.  Has a history of underlying allergic rhinitis but does not take anything for it.  He is also a smoker.  Does not have chest pain, shortness of breath or wheezing.  No history of respiratory disorders.  He does have a history of CAD, hypertension, type 2 diabetes treated with insulin.  His blood sugar has been well controlled.  No current facility-administered medications for this encounter.  Current Outpatient Medications:    atorvastatin (LIPITOR) 80 MG tablet, Take 1 tablet (80 mg total) by mouth daily., Disp: 90 tablet, Rfl: 3   baclofen (LIORESAL) 10 MG tablet, TAKE 1 TABLET(10 MG) BY MOUTH TWICE DAILY (Patient taking differently: Take 10 mg by mouth 2 (two) times daily.), Disp: 60 tablet, Rfl: 3   blood glucose meter kit and supplies KIT, Dispense based on patient and insurance preference. Use up to three times daily. E11.59, Disp: 1 each, Rfl: 0   Blood Glucose Monitoring Suppl (ACCU-CHEK AVIVA PLUS) w/Device KIT, 1 Device by Does not apply route 3 (three) times daily after meals., Disp: 1 kit, Rfl: 0   cetirizine (ZYRTEC) 10 MG tablet, Take 1 tablet (10 mg total) by mouth daily., Disp: 30 tablet, Rfl: 0   dapagliflozin propanediol (FARXIGA) 10 MG TABS tablet, Take 1 tablet (10 mg total) by mouth daily before breakfast., Disp: 90 tablet, Rfl: 3   diclofenac (VOLTAREN) 50 MG EC tablet, TAKE 1 TABLET(50 MG) BY MOUTH TWICE DAILY, Disp: 60 tablet, Rfl: 1   diclofenac Sodium (VOLTAREN) 1 % GEL, Apply 4 g topically 4 (four) times daily as needed (pain)., Disp: 350 g, Rfl: 4   docusate sodium (COLACE) 100 MG capsule, TAKE 1 CAPSULE(100 MG) BY MOUTH  TWICE DAILY, Disp: 40 capsule, Rfl: 0   gabapentin (NEURONTIN) 300 MG capsule, Take 1 capsule (300 mg total) by mouth 2 (two) times daily AND 2 capsules (600 mg total) at bedtime., Disp: 120 capsule, Rfl: 2   glucose blood (ACCU-CHEK GUIDE) test strip, USE TWICE DAILY AS DIRECTED TO TEST BLOOD SUGAR, Disp: 100 strip, Rfl: 11   insulin aspart (NOVOLOG FLEXPEN) 100 UNIT/ML FlexPen, Inject 15 Units into the skin 3 (three) times daily with meals., Disp: 30 mL, Rfl: 6   insulin degludec (TRESIBA FLEXTOUCH) 100 UNIT/ML FlexTouch Pen, Inject 40 Units into the skin daily., Disp: 45 mL, Rfl: 3   Insulin Pen Needle (BD PEN NEEDLE NANO 2ND GEN) 32G X 4 MM MISC, Inject 1 Device into the skin in the morning, at noon, in the evening, and at bedtime., Disp: 400 each, Rfl: 3   Lancets (ACCU-CHEK SOFT TOUCH) lancets, Use as instructed to test blood sugar 3 times daily DX E11.42, Disp: 100 each, Rfl: 12   metFORMIN (GLUCOPHAGE) 1000 MG tablet, TAKE 1 TABLET(1000 MG) BY MOUTH TWICE DAILY WITH A MEAL, Disp: 180 tablet, Rfl: 0   methocarbamol (ROBAXIN) 500 MG tablet, Take 1 tablet (500 mg total) by mouth every 8 (eight) hours as needed for muscle spasms., Disp: 40 tablet, Rfl: 1   omega-3 acid ethyl esters (LOVAZA) 1 g capsule, Take 1 capsule (1 g total) by mouth 2 (two) times  daily., Disp: 60 capsule, Rfl: 11   No Known Allergies  Past Medical History:  Diagnosis Date   Arthritis    All over   CAD (coronary artery disease), native coronary artery, minimal by cardiac cath 01/08/19 01/09/2019   Chronic pain syndrome    Diabetes mellitus    History of cocaine use    History of kidney stones    Hypercholesteremia    Hypertension    Low back pain    Lumbar radiculopathy    Right leg pain    Seborrheic dermatitis of scalp    Shingles      Past Surgical History:  Procedure Laterality Date   CIRCUMCISION N/A 03/16/2018   Procedure: CIRCUMCISION ADULT WITH PENILE BLOCK;  Surgeon: Alexis Frock, MD;  Location:  [facility];  Service: Urology;  Laterality: N/A;  34 MINS   KNEE ARTHROSCOPY Bilateral    LEFT HEART CATH AND CORONARY ANGIOGRAPHY N/A 01/08/2019   Procedure: LEFT HEART CATH AND CORONARY ANGIOGRAPHY;  Surgeon: Troy Sine, MD;  Location: [facility];  Service: Cardiovascular;  Laterality: N/A;    Family History  Problem Relation Age of Onset   Cancer Mother    Stroke Father    Heart disease Father    Diabetes Father     Social History   Tobacco Use   Smoking status: Every Day    Packs/day: 1.00    Years: 50.00    Pack years: 50.00    Types: Cigarettes   Smokeless tobacco: Former    Types: Nurse, children's Use: Never used  Substance Use Topics   Alcohol use: Not Currently    Comment: occ   Drug use: Not Currently    Types: Marijuana, Cocaine    Comment: twice a week    ROS   Objective:   Vitals: BP 133/71    Pulse 85    Temp 98.3 F (36.8 C)    Resp 18    SpO2 99%   Physical Exam Constitutional:      General: He is not in acute distress.    Appearance: Normal appearance. He is well-developed. He is not ill-appearing, toxic-appearing or diaphoretic.  HENT:     Head: Normocephalic and atraumatic.     Right Ear: Tympanic membrane, ear canal and external ear normal. There is no impacted cerumen.     Left Ear: Tympanic membrane, ear canal and external ear normal. There is no impacted cerumen.     Nose: Congestion and rhinorrhea present.     Mouth/Throat:     Mouth: Mucous membranes are moist.     Pharynx: No oropharyngeal exudate or posterior oropharyngeal erythema.  Eyes:     General: No scleral icterus.       Right eye: No discharge.        Left eye: No discharge.     Extraocular Movements: Extraocular movements intact.  Cardiovascular:     Rate and Rhythm: Normal rate and regular rhythm.     Heart sounds: Normal heart sounds. No murmur heard.   No friction rub. No gallop.  Pulmonary:     Effort: Pulmonary effort is  normal. No respiratory distress.     Breath sounds: Normal breath sounds. No stridor. No wheezing, rhonchi or rales.  Neurological:     Mental Status: He is alert and oriented to person, place, and time.  Psychiatric:        Mood and Affect: Mood normal.  Behavior: Behavior normal.        Thought Content: Thought content normal.    Assessment and Plan :   PDMP not reviewed this encounter.  1. Other acute recurrent sinusitis   2. Type 2 diabetes mellitus treated with insulin ([place])   3. Allergic rhinitis due to other allergic trigger, unspecified seasonality   4. Smoker    Since he just completed a course of amoxicillin about 2 months ago, will use doxycycline instead for recurrent sinusitis.  Deferred imaging given clear cardiopulmonary exam, hemodynamically stable vital signs. Deferred testing due to timeline of illness. Recommended starting Xyzal daily for underlying allergic rhinitis. For his current flare, will use prednisone. Counseled patient on potential for adverse effects with medications prescribed/recommended today, ER and return-to-clinic precautions discussed, patient verbalized understanding.     Jaynee Eagles, PA-C 07/27/21 1244

## 2021-07-27 NOTE — ED Triage Notes (Signed)
Pt reports for 2 weeks he has been congested ,sneezing . Pt reports a head cold.

## 2021-08-09 ENCOUNTER — Other Ambulatory Visit: Payer: Self-pay | Admitting: Internal Medicine

## 2021-08-09 DIAGNOSIS — Z794 Long term (current) use of insulin: Secondary | ICD-10-CM

## 2021-08-09 DIAGNOSIS — E1129 Type 2 diabetes mellitus with other diabetic kidney complication: Secondary | ICD-10-CM

## 2021-08-19 ENCOUNTER — Other Ambulatory Visit: Payer: Self-pay | Admitting: Urology

## 2021-08-19 DIAGNOSIS — R972 Elevated prostate specific antigen [PSA]: Secondary | ICD-10-CM

## 2021-09-03 ENCOUNTER — Other Ambulatory Visit: Payer: Self-pay

## 2021-09-03 ENCOUNTER — Ambulatory Visit
Admission: RE | Admit: 2021-09-03 | Discharge: 2021-09-03 | Disposition: A | Discharge status: Home / Self Care | Payer: Medicare Other | Source: Ambulatory Visit | Attending: Urology | Admitting: Urology

## 2021-09-03 DIAGNOSIS — R972 Elevated prostate specific antigen [PSA]: Secondary | ICD-10-CM | POA: Diagnosis not present

## 2021-09-03 DIAGNOSIS — K5730 Diverticulosis of large intestine without perforation or abscess without bleeding: Secondary | ICD-10-CM | POA: Diagnosis not present

## 2021-09-03 DIAGNOSIS — N3289 Other specified disorders of bladder: Secondary | ICD-10-CM | POA: Diagnosis not present

## 2021-09-03 MED ORDER — GADOBENATE DIMEGLUMINE 529 MG/ML IV SOLN
20.0000 mL | Freq: Once | INTRAVENOUS | Status: AC | PRN
  Administered 2021-09-03: 20 mL via INTRAVENOUS

## 2021-09-08 ENCOUNTER — Ambulatory Visit: Payer: Medicare Other | Admitting: Dermatology

## 2021-09-13 DIAGNOSIS — C642 Malignant neoplasm of left kidney, except renal pelvis: Secondary | ICD-10-CM | POA: Diagnosis not present

## 2021-09-13 DIAGNOSIS — Z79899 Other long term (current) drug therapy: Secondary | ICD-10-CM | POA: Diagnosis not present

## 2021-09-13 DIAGNOSIS — Z0130 Encounter for examination of blood pressure without abnormal findings: Secondary | ICD-10-CM | POA: Diagnosis not present

## 2021-09-13 DIAGNOSIS — F1491 Cocaine use, unspecified, in remission: Secondary | ICD-10-CM | POA: Diagnosis not present

## 2021-09-13 DIAGNOSIS — Z6830 Body mass index (BMI) 30.0-30.9, adult: Secondary | ICD-10-CM | POA: Diagnosis not present

## 2021-09-13 DIAGNOSIS — E119 Type 2 diabetes mellitus without complications: Secondary | ICD-10-CM | POA: Diagnosis not present

## 2021-09-13 DIAGNOSIS — C61 Malignant neoplasm of prostate: Secondary | ICD-10-CM | POA: Diagnosis not present

## 2021-09-20 DIAGNOSIS — C642 Malignant neoplasm of left kidney, except renal pelvis: Secondary | ICD-10-CM | POA: Diagnosis not present

## 2021-09-20 DIAGNOSIS — Z79899 Other long term (current) drug therapy: Secondary | ICD-10-CM | POA: Diagnosis not present

## 2021-09-20 DIAGNOSIS — Z0130 Encounter for examination of blood pressure without abnormal findings: Secondary | ICD-10-CM | POA: Diagnosis not present

## 2021-09-20 DIAGNOSIS — F1491 Cocaine use, unspecified, in remission: Secondary | ICD-10-CM | POA: Diagnosis not present

## 2021-09-20 DIAGNOSIS — C61 Malignant neoplasm of prostate: Secondary | ICD-10-CM | POA: Diagnosis not present

## 2021-09-20 DIAGNOSIS — Z6830 Body mass index (BMI) 30.0-30.9, adult: Secondary | ICD-10-CM | POA: Diagnosis not present

## 2021-09-20 DIAGNOSIS — E119 Type 2 diabetes mellitus without complications: Secondary | ICD-10-CM | POA: Diagnosis not present

## 2021-10-06 ENCOUNTER — Ambulatory Visit: Payer: Medicare Other | Admitting: Specialist

## 2021-10-08 ENCOUNTER — Other Ambulatory Visit: Payer: Self-pay | Admitting: Specialist

## 2021-12-02 ENCOUNTER — Other Ambulatory Visit: Payer: Self-pay | Admitting: Internal Medicine

## 2021-12-02 DIAGNOSIS — E1129 Type 2 diabetes mellitus with other diabetic kidney complication: Secondary | ICD-10-CM

## 2022-01-07 ENCOUNTER — Ambulatory Visit (INDEPENDENT_AMBULATORY_CARE_PROVIDER_SITE_OTHER): Payer: Medicare Other | Admitting: Specialist

## 2022-01-07 ENCOUNTER — Encounter: Payer: Self-pay | Admitting: Specialist

## 2022-01-07 ENCOUNTER — Ambulatory Visit (INDEPENDENT_AMBULATORY_CARE_PROVIDER_SITE_OTHER): Payer: Medicare Other

## 2022-01-07 VITALS — BP 150/82 | HR 93 | Ht 72.0 in | Wt 224.0 lb

## 2022-01-07 DIAGNOSIS — E11610 Type 2 diabetes mellitus with diabetic neuropathic arthropathy: Secondary | ICD-10-CM | POA: Diagnosis not present

## 2022-01-07 DIAGNOSIS — Z981 Arthrodesis status: Secondary | ICD-10-CM | POA: Diagnosis not present

## 2022-01-07 DIAGNOSIS — Z794 Long term (current) use of insulin: Secondary | ICD-10-CM | POA: Diagnosis not present

## 2022-01-07 DIAGNOSIS — M4726 Other spondylosis with radiculopathy, lumbar region: Secondary | ICD-10-CM

## 2022-01-07 MED ORDER — GABAPENTIN 300 MG PO CAPS: ORAL_CAPSULE | ORAL | 0 refills | Status: DC

## 2022-01-07 NOTE — Progress Notes (Addendum)
Office Visit Note   Patient: Ryan Farmer           Date of Birth: [birth year]           MRN: [id number] Visit Date: 01/07/2022              Requested by: Charlott Rakes, MD [place],  [place] [id number] PCP: Charlott Rakes, MD   Assessment & Plan: Visit Diagnoses:  1. S/P lumbar fusion   2. Other spondylosis with radiculopathy, lumbar region   3. Type 2 diabetes mellitus with diabetic neuropathic arthropathy, with long-term current use of insulin ([facility])     Plan: Avoid frequent bending and stooping  No lifting greater than 10 lbs. May use ice or moist heat for pain. Weight loss is of benefit. Best medication for lumbar disc disease is arthritis medications like motrin, celebrex and naprosyn. Exercise is important to improve your indurance and does allow people to function better inspite of back pain.    Follow-Up Instructions: Return in about 3 months (around 04/09/2022).   Orders:  Orders Placed This Encounter  Procedures   XR Lumbar Spine 2-3 Views   No orders of the defined types were placed in this encounter.     Procedures: No procedures performed   Clinical Data: No additional findings.   Subjective: Chief Complaint  Patient presents with   Lower Back - Follow-up    66 year old male with history of TLIF 04/2021 with good improvement in his standing and walking tolerance. He is having problem with left shoulder With reaching behind him. He is having urinary frequency in small amount.  Review of Systems  Constitutional: Negative.   HENT: Negative.    Eyes: Negative.   Respiratory: Negative.    Genitourinary:  Positive for enuresis, frequency and urgency.  Neurological:  Positive for syncope.     Objective: Vital Signs: BP (!) 150/82   Pulse 93   Ht 6' (1.829 m)   Wt 224 lb (101.6 kg)   BMI 30.38 kg/m   Physical Exam Constitutional:      Appearance: He is well-developed.  HENT:     Head: Normocephalic and  atraumatic.  Eyes:     Pupils: Pupils are equal, round, and reactive to light.  Pulmonary:     Effort: Pulmonary effort is normal.     Breath sounds: Normal breath sounds.  Abdominal:     General: Bowel sounds are normal.     Palpations: Abdomen is soft.  Musculoskeletal:     Cervical back: Normal range of motion and neck supple.     Lumbar back: Negative left straight leg raise test.  Skin:    General: Skin is warm and dry.  Neurological:     Mental Status: He is alert and oriented to person, place, and time.  Psychiatric:        Behavior: Behavior normal.        Thought Content: Thought content normal.        Judgment: Judgment normal.    Back Exam   Tenderness  The patient is experiencing tenderness in the lumbar.  Range of Motion  Extension:  abnormal  Flexion:  abnormal  Lateral bend right:  abnormal  Lateral bend left:  abnormal  Rotation right:  abnormal   Muscle Strength  Right Quadriceps:  5/5  Left Quadriceps:  5/5  Right Hamstrings:  5/5  Left Hamstrings:  5/5   Tests  Straight leg raise left:  negative  Other  Erythema: no back redness Scars: present    Specialty Comments:  No specialty comments available.  Imaging: No results found.   PMFS History: Patient Active Problem List   Diagnosis Date Noted   Spinal stenosis of lumbosacral region 04/21/2021   Spondylolisthesis, lumbar region    Palmar wart 04/05/2021   Acute non-recurrent maxillary sinusitis 04/05/2021   Type 2 diabetes mellitus with hyperglycemia, with long-term current use of insulin ([place]) 08/20/2020   Type 2 diabetes mellitus with microalbuminuria, with long-term current use of insulin ([place]) 10/14/2019   Type 2 diabetes mellitus with diabetic polyneuropathy, with long-term current use of insulin ([place]) 04/15/2019   Hypertriglyceridemia 04/15/2019   Spinal stenosis of lumbar region with neurogenic claudication 03/07/2019   Lumbar radiculopathy 03/07/2019   Chronic bilateral low  back pain with bilateral sciatica 03/07/2019   CAD (coronary artery disease), native coronary artery, minimal by cardiac cath 01/08/19 01/09/2019   Syncope and collapse    Spondylosis without myelopathy or radiculopathy, lumbar region 04/26/2018   Cocaine abuse ([place]) 12/19/2016   Shingles outbreak 12/13/2016   Diabetes type 2, uncontrolled 04/19/2016   Metatarsalgia 10/19/2015   Back muscle spasm 11/18/2013   Hyperlipidemia 11/18/2013   HTN (hypertension) 11/22/2012   Chronic pain syndrome 11/22/2012   Tobacco use disorder 11/22/2012   Dental caries 11/22/2012   Past Medical History:  Diagnosis Date   Arthritis    All over   CAD (coronary artery disease), native coronary artery, minimal by cardiac cath 01/08/19 01/09/2019   Chronic pain syndrome    Diabetes mellitus    History of cocaine use    History of kidney stones    Hypercholesteremia    Hypertension    Low back pain    Lumbar radiculopathy    Right leg pain    Seborrheic dermatitis of scalp    Shingles     Family History  Problem Relation Age of Onset   Cancer Mother    Stroke Father    Heart disease Father    Diabetes Father     Past Surgical History:  Procedure Laterality Date   CIRCUMCISION N/A 03/16/2018   Procedure: CIRCUMCISION ADULT WITH PENILE BLOCK;  Surgeon: Alexis Frock, MD;  Location: [facility];  Service: Urology;  Laterality: N/A;  32 MINS   KNEE ARTHROSCOPY Bilateral    LEFT HEART CATH AND CORONARY ANGIOGRAPHY N/A 01/08/2019   Procedure: LEFT HEART CATH AND CORONARY ANGIOGRAPHY;  Surgeon: Troy Sine, MD;  Location: [facility];  Service: Cardiovascular;  Laterality: N/A;   Social History   Occupational History   Not on file  Tobacco Use   Smoking status: Every Day    Packs/day: 1.00    Years: 50.00    Total pack years: 50.00    Types: Cigarettes   Smokeless tobacco: Former    Types: Nurse, children's Use: Never used  Substance and Sexual Activity    Alcohol use: Not Currently    Comment: occ   Drug use: Not Currently    Types: Marijuana, Cocaine    Comment: twice a week   Sexual activity: Not on file

## 2022-01-07 NOTE — Patient Instructions (Signed)
Avoid frequent bending and stooping  No lifting greater than 10 lbs. May use ice or moist heat for pain. Weight loss is of benefit. Best medication for lumbar disc disease is arthritis medications like motrin, celebrex and naprosyn. Exercise is important to improve your indurance and does allow people to function better inspite of back pain.   

## 2022-01-13 ENCOUNTER — Encounter: Payer: Self-pay | Admitting: Internal Medicine

## 2022-01-13 ENCOUNTER — Ambulatory Visit (INDEPENDENT_AMBULATORY_CARE_PROVIDER_SITE_OTHER): Payer: Medicare Other | Admitting: Internal Medicine

## 2022-01-13 VITALS — BP 140/80 | HR 92 | Ht 72.0 in | Wt 209.0 lb

## 2022-01-13 DIAGNOSIS — E1165 Type 2 diabetes mellitus with hyperglycemia: Secondary | ICD-10-CM

## 2022-01-13 DIAGNOSIS — E1142 Type 2 diabetes mellitus with diabetic polyneuropathy: Secondary | ICD-10-CM | POA: Diagnosis not present

## 2022-01-13 DIAGNOSIS — Z794 Long term (current) use of insulin: Secondary | ICD-10-CM

## 2022-01-13 DIAGNOSIS — E1129 Type 2 diabetes mellitus with other diabetic kidney complication: Secondary | ICD-10-CM | POA: Diagnosis not present

## 2022-01-13 DIAGNOSIS — R809 Proteinuria, unspecified: Secondary | ICD-10-CM

## 2022-01-13 DIAGNOSIS — E785 Hyperlipidemia, unspecified: Secondary | ICD-10-CM | POA: Diagnosis not present

## 2022-01-13 LAB — POCT GLYCOSYLATED HEMOGLOBIN (HGB A1C): Hemoglobin A1C: 9.6 % — AB (ref 4.0–5.6)

## 2022-01-13 MED ORDER — INSULIN LISPRO PROT & LISPRO (75-25 MIX) 100 UNIT/ML KWIKPEN: 30.0000 [IU] | PEN_INJECTOR | Freq: Two times a day (BID) | SUBCUTANEOUS | 3 refills | Status: DC

## 2022-01-13 MED ORDER — DAPAGLIFLOZIN PROPANEDIOL 10 MG PO TABS: 10.0000 mg | ORAL_TABLET | Freq: Every day | ORAL | 3 refills | Status: DC

## 2022-01-13 MED ORDER — METFORMIN HCL 1000 MG PO TABS: 1000.0000 mg | ORAL_TABLET | Freq: Two times a day (BID) | ORAL | 3 refills | Status: DC

## 2022-01-13 MED ORDER — BD PEN NEEDLE NANO 2ND GEN 32G X 4 MM MISC: 1.0000 | Freq: Two times a day (BID) | 3 refills | Status: DC

## 2022-01-13 NOTE — Progress Notes (Signed)
Name: Ryan Farmer  Age/ Sex: 66 y.o., male   MRN/ DOB: [id number], October 26, 1955     PCP: Charlott Rakes, MD   Reason for Endocrinology Evaluation: Type 2 Diabetes Mellitus  Initial Endocrine Consultative Visit: 04/15/2019    PATIENT IDENTIFIER: Ryan Farmer is a 66 y.o. male with a past medical history of CAD, T2DM, Hyperlipidemia and polysubstance abuse. The patient has followed with Endocrinology clinic since 04/15/2019 for consultative assistance with management of his diabetes.  DIABETIC HISTORY:  Ryan Farmer was diagnosed with DM in 2011. He has been on SU in the past, has been on metformin since diagnosis, insulin started in 2018. His hemoglobin A1c has ranged from 6.4% in 2017, peaking at 12.6% in 2020.  On his initial visit to our clinic he was on Lantus and Metformin with an A1c 10.4% . We stopped lantus and started a novolog Mix.    We attempted MDI regimen in 2022 but the patient did not like that regimen as he felt the insulin works were stronger and he was switched back July 2023  Lives with sister, on disability ( can not read or write)    SUBJECTIVE:   During the last visit (03/20/2021): A1c 8.0 % . Continued metformin and increased Novolog Mix .      Today (01/13/2022): Ryan Farmer is here for a follow up on diabetes management.   The pt checks glucose 1 x a day . The patient has not had hypoglycemic episodes since the last clinic visit.  He is complaining of hyperglycemia and is not satisfied with the [place] and the NovoLog and would like to go back to the insulin mix as he feels it stronger   Denies nausea, vomiting  or diarrhea  Continues to follow up with the pain clinic   Pt would like to change to insulin mix       HOME DIABETES REGIMEN:  Tresiba 40 units daily Novolog 20 units with each meal   Metformin 1000 mg BID Farxiga 10 mg daily      METER DOWNLOAD SUMMARY: Unable to download  101 - 404 mg/dL        DIABETIC  COMPLICATIONS: Microvascular complications:  Neuropathy  Denies: CKD , retinopathy  Last eye exam: Completed > 1    Macrovascular complications:  CAD Denies: PVD, CVA      HISTORY:  Past Medical History:  Past Medical History:  Diagnosis Date   Arthritis    All over   CAD (coronary artery disease), native coronary artery, minimal by cardiac cath 01/08/19 01/09/2019   Chronic pain syndrome    Diabetes mellitus    History of cocaine use    History of kidney stones    Hypercholesteremia    Hypertension    Low back pain    Lumbar radiculopathy    Right leg pain    Seborrheic dermatitis of scalp    Shingles    Past Surgical History:  Past Surgical History:  Procedure Laterality Date   CIRCUMCISION N/A 03/16/2018   Procedure: CIRCUMCISION ADULT WITH PENILE BLOCK;  Surgeon: Alexis Frock, MD;  Location: [facility];  Service: Urology;  Laterality: N/A;  54 MINS   KNEE ARTHROSCOPY Bilateral    LEFT HEART CATH AND CORONARY ANGIOGRAPHY N/A 01/08/2019   Procedure: LEFT HEART CATH AND CORONARY ANGIOGRAPHY;  Surgeon: Troy Sine, MD;  Location: [facility];  Service: Cardiovascular;  Laterality: N/A;   Social History:  reports that he  has been smoking cigarettes. He has a 50.00 pack-year smoking history. He has quit using smokeless tobacco.  His smokeless tobacco use included chew. He reports that he does not currently use alcohol. He reports that he does not currently use drugs after having used the following drugs: Marijuana and Cocaine. Family History:  Family History  Problem Relation Age of Onset   Cancer Mother    Stroke Father    Heart disease Father    Diabetes Father      HOME MEDICATIONS: Allergies as of 01/13/2022   No Known Allergies      Medication List        Accurate as of January 13, 2022  2:00 PM. If you have any questions, ask your nurse or doctor.          Accu-Chek Aviva Plus w/Device Kit 1 Device by Does not apply route 3  (three) times daily after meals.   Accu-Chek Guide test strip Generic drug: glucose blood USE TWICE DAILY AS DIRECTED TO TEST BLOOD SUGAR   accu-chek soft touch lancets Use as instructed to test blood sugar 3 times daily DX E11.42   atorvastatin 80 MG tablet Commonly known as: LIPITOR Take 1 tablet (80 mg total) by mouth daily.   baclofen 10 MG tablet Commonly known as: LIORESAL TAKE 1 TABLET(10 MG) BY MOUTH TWICE DAILY   BD Pen Needle Nano 2nd Gen 32G X 4 MM Misc Generic drug: Insulin Pen Needle Inject 1 Device into the skin in the morning, at noon, in the evening, and at bedtime.   blood glucose meter kit and supplies Kit Dispense based on patient and insurance preference. Use up to three times daily. E11.59   cetirizine 10 MG tablet Commonly known as: ZYRTEC Take 1 tablet (10 mg total) by mouth daily.   dapagliflozin propanediol 10 MG Tabs tablet Commonly known as: Farxiga Take 1 tablet (10 mg total) by mouth daily before breakfast.   diclofenac 50 MG EC tablet Commonly known as: VOLTAREN TAKE 1 TABLET(50 MG) BY MOUTH TWICE DAILY   diclofenac Sodium 1 % Gel Commonly known as: VOLTAREN Apply 4 g topically 4 (four) times daily as needed (pain).   docusate sodium 100 MG capsule Commonly known as: COLACE TAKE 1 CAPSULE(100 MG) BY MOUTH TWICE DAILY   doxycycline 100 MG capsule Commonly known as: VIBRAMYCIN Take 1 capsule (100 mg total) by mouth 2 (two) times daily.   gabapentin 300 MG capsule Commonly known as: NEURONTIN Take 1 capsule (300 mg total) by mouth 2 (two) times daily AND 2 capsules (600 mg total) at bedtime.   levocetirizine 5 MG tablet Commonly known as: XYZAL Take 1 tablet (5 mg total) by mouth every evening.   metFORMIN 1000 MG tablet Commonly known as: GLUCOPHAGE TAKE 1 TABLET(1000 MG) BY MOUTH TWICE DAILY WITH A MEAL   methocarbamol 500 MG tablet Commonly known as: ROBAXIN Take 1 tablet (500 mg total) by mouth every 8 (eight) hours as  needed for muscle spasms.   NovoLOG FlexPen 100 UNIT/ML FlexPen Generic drug: insulin aspart Inject 15 Units into the skin 3 (three) times daily with meals.   omega-3 acid ethyl esters 1 g capsule Commonly known as: Lovaza Take 1 capsule (1 g total) by mouth 2 (two) times daily.   predniSONE 20 MG tablet Commonly known as: DELTASONE Take 2 tablets daily with breakfast.   Tyler Aas FlexTouch 100 UNIT/ML FlexTouch Pen Generic drug: insulin degludec Inject 40 Units into the skin daily.  OBJECTIVE:   Vital Signs: BP 140/80 (BP Location: Left Arm, Patient Position: Sitting, Cuff Size: Small)   Pulse 92   Ht 6' (1.829 m)   Wt 209 lb (94.8 kg)   SpO2 100%   BMI 28.35 kg/m   Wt Readings from Last 3 Encounters:  01/13/22 209 lb (94.8 kg)  01/07/22 224 lb (101.6 kg)  07/07/21 224 lb (101.6 kg)     Exam: General: Pt appears well and is in NAD  Lungs: Clear with good BS bilat with no rales, rhonchi, or wheezes  Heart: RRR with normal S1 and S2 and no gallops; no murmurs; no rub  Neuro: MS is good with appropriate affect, pt is alert and Ox3      DM foot exam: 05/20/2021   The skin of the feet is intact without sores or ulcerations. The pedal pulses are 2+ on right and 2+ on left. The sensation is intact at the right heel to a screening 5.07, 10 gram monofilament    DATA REVIEWED:  Lab Results  Component Value Date   HGBA1C 8.0 (H) 03/25/2021   HGBA1C 7.8 (H) 12/24/2020   HGBA1C 8.8 (A) 08/20/2020        Results for JOSEPHANTHONY, TINDEL (MRN [id number]) as of 04/30/2021 07:10  Ref. Range 04/21/2021 04:39  Sodium Latest Ref Range: 135 - 145 mmol/L 135  Potassium Latest Ref Range: 3.5 - 5.1 mmol/L 4.2  Chloride Latest Ref Range: 98 - 111 mmol/L 102  CO2 Latest Ref Range: 22 - 32 mmol/L 22  Glucose Latest Ref Range: 70 - 99 mg/dL 155 (H)  BUN Latest Ref Range: 8 - 23 mg/dL 30 (H)  Creatinine Latest Ref Range: 0.61 - 1.24 mg/dL 1.11  Calcium Latest Ref Range: 8.9  - 10.3 mg/dL 9.0  Anion gap Latest Ref Range: 5 - 15  11  GFR, Estimated Latest Ref Range: >60 mL/min >60     ASSESSMENT / PLAN / RECOMMENDATIONS:   1) Type 2  Diabetes Mellitus, poorly control, With Neuropathy, and CAD  complications - Most recent A1c of 9.6 % . Goal A1c < 7.5 %.    -He is again not sure if he is on the [place] or not, we discussed cardiovascular benefits as well as renal benefits, I will represcribe it for him, he understands that this could cause weight loss -He is not happy with the [place] and the NovoLog, stating he is requiring too many units and is not helping with controlling his glucose, patient would like to go back to the insulin mix -We will change as below MEDICATIONS: Stop Tresiba 40 units daily  Stop Novolog 15 units TIDQAC Start Humalog mix 30 units twice daily Continue metformin 1000 mg BID  Restart Farxiga 10 mg, 1 tablet daily   EDUCATION / INSTRUCTIONS: BG monitoring instructions: Patient is instructed to check his blood sugars 2 times a day, fasting and supper . Call [facility] if: BG persistently < 70 I reviewed the Rule of 15 for the treatment of hypoglycemia in detail with the patient. Literature supplied.  2) Diabetic complications:  Eye: Does not have known diabetic retinopathy. Neuro/ Feet: Does  have known diabetic peripheral neuropathy. Renal: Patient does not have known baseline CKD. He is on an ACEI/ARB at present   3) Hypertriglyceridemia:    -His  TG has been as high as 545 mg/dL in 2015 but this has been trending down to normal. Encouraged compliance with statin intake  -Patient states that he  has 2 doses of atorvastatin, 80 mg and 40 mg and he takes atorvastatin daily but the dose depends on which bottle he gets -He does not need any refill at this time as he indicates he has plenty -We will check on next visit  Continue atorvastatin 40 mg daily   F/U in 4 months    Signed electronically by: Mack Guise, MD  [facility] Endocr[facility]  [facility] [address] [place], [place] [address] Phone: [phone] FAX: [phone]   CC: Charlott Rakes, MD [place] [address] Phone: [phone]  Fax: [phone]  Return to Endocrinology clinic as below: Future Appointments  Date Time Provider [place]  04/08/2022  8:45 AM Jessy Oto, MD [facility] None  04/11/2022  1:45 PM Jessy Oto, MD [facility] None

## 2022-01-13 NOTE — Patient Instructions (Signed)
-   STOP Tresiba  - STOP Novolog  - Start Humalog MIX 30 units twice daily  - Continue Metformin 1000 mg twice daily  - Restart Farxiga 10 mg daily   HOW TO TREAT LOW BLOOD SUGARS (Blood sugar LESS THAN 70 MG/DL) Please follow the RULE OF 15 for the treatment of hypoglycemia treatment (when your (blood sugars are less than 70 mg/dL)   STEP 1: Take 15 grams of carbohydrates when your blood sugar is low, which includes:  3-4 GLUCOSE TABS  OR 3-4 OZ OF JUICE OR REGULAR SODA OR ONE TUBE OF GLUCOSE GEL    STEP 2: RECHECK blood sugar in 15 MINUTES STEP 3: If your blood sugar is still low at the 15 minute recheck --> then, go back to STEP 1 and treat AGAIN with another 15 grams of carbohydrates.

## 2022-01-17 ENCOUNTER — Other Ambulatory Visit: Payer: Self-pay | Admitting: Specialist

## 2022-01-17 NOTE — Addendum Note (Signed)
Addended by: Minda Ditto, Alyse Low N on: 01/17/2022 11:31 AM   Modules accepted: Orders

## 2022-01-17 NOTE — Telephone Encounter (Signed)
Patient came in today requesting a diclofenac sodium and he also states he got his teeth cleaned and he needed some antibiotics.

## 2022-01-17 NOTE — Telephone Encounter (Signed)
Medication request sent to [facility] Rinks,  As far as Antibiotics--Dr. Louanne Skye only does them for 6 months only post-op.  I did call and lmom for patient to let him know this.

## 2022-01-19 MED ORDER — DICLOFENAC SODIUM 50 MG PO TBEC
DELAYED_RELEASE_TABLET | ORAL | 0 refills | Status: DC
Start: 2022-01-19 — End: 2022-05-31

## 2022-04-08 ENCOUNTER — Ambulatory Visit: Payer: Medicare Other | Admitting: Specialist

## 2022-04-11 ENCOUNTER — Ambulatory Visit: Payer: Medicare Other | Admitting: Specialist

## 2022-04-11 ENCOUNTER — Other Ambulatory Visit: Payer: Self-pay | Admitting: Urology

## 2022-04-13 ENCOUNTER — Ambulatory Visit: Payer: Medicare Other | Admitting: Internal Medicine

## 2022-04-25 NOTE — Patient Instructions (Signed)
DUE TO COVID-19 ONLY TWO VISITORS  (aged 65 and older)  ARE ALLOWED TO COME WITH YOU AND STAY IN THE WAITING ROOM ONLY DURING PRE OP AND PROCEDURE.   **NO VISITORS ARE ALLOWED IN THE SHORT STAY AREA OR RECOVERY ROOM!!**  IF YOU WILL BE ADMITTED INTO THE HOSPITAL YOU ARE ALLOWED ONLY FOUR SUPPORT PEOPLE DURING VISITATION HOURS ONLY (7 AM -8PM)   The support person(s) must pass our screening, gel in and out, and wear a mask at all times, including in the patient's room. Patients must also wear a mask when staff or their support person are in the room. Visitors GUEST BADGE MUST BE WORN VISIBLY  One adult visitor may remain with you overnight and MUST be in the room by 8 P.M.     Your procedure is scheduled on: 05/13/22   Report to [facility] Main Entrance    Report to admitting at : 5:15 AM   Call this number if you have problems the morning of surgery [phone]   Clear liquids starting the day before surgery until : 4:30 AM DAY OF SURGERY. Drink plenty clear liquids.  Water Black Coffee (sugar ok, NO MILK/CREAM OR CREAMERS)  Tea (sugar ok, NO MILK/CREAM OR CREAMERS) regular and decaf                             Plain Jell-O (NO RED)                                           Fruit ices (not with fruit pulp, NO RED)                                     Popsicles (NO RED)                                                                  Juice: apple, WHITE grape, WHITE cranberry Sports drinks like Gatorade (NO RED)             FOLLOW BOWEL PREP AND ANY ADDITIONAL PRE OP INSTRUCTIONS YOU RECEIVED FROM YOUR SURGEON'S OFFICE!!!   Oral Hygiene is also important to reduce your risk of infection.                                    Remember - BRUSH YOUR TEETH THE MORNING OF SURGERY WITH YOUR REGULAR TOOTHPASTE   Do NOT smoke after Midnight   Take these medicines the morning of surgery with A SIP OF WATER: N/A  How to Manage Your Diabetes Before and After Surgery  Why is it  important to control my blood sugar before and after surgery? Improving blood sugar levels before and after surgery helps healing and can limit problems. A way of improving blood sugar control is eating a healthy diet by:  Eating less sugar and carbohydrates  Increasing activity/exercise  Talking with your doctor about reaching your blood sugar goals High blood sugars (  greater than 180 mg/dL) can raise your risk of infections and slow your recovery, so you will need to focus on controlling your diabetes during the weeks before surgery. Make sure that the doctor who takes care of your diabetes knows about your planned surgery including the date and location.  How do I manage my blood sugar before surgery? Check your blood sugar at least 4 times a day, starting 2 days before surgery, to make sure that the level is not too high or low. Check your blood sugar the morning of your surgery when you wake up and every 2 hours until you get to the Short Stay unit. If your blood sugar is less than 70 mg/dL, you will need to treat for low blood sugar: Do not take insulin. Treat a low blood sugar (less than 70 mg/dL) with  cup of clear juice (cranberry or apple), 4 glucose tablets, OR glucose gel. Recheck blood sugar in 15 minutes after treatment (to make sure it is greater than 70 mg/dL). If your blood sugar is not greater than 70 mg/dL on recheck, call [phone] for further instructions. Report your blood sugar to the short stay nurse when you get to Short Stay.  If you are admitted to the hospital after surgery: Your blood sugar will be checked by the staff and you will probably be given insulin after surgery (instead of oral diabetes medicines) to make sure you have good blood sugar levels. The goal for blood sugar control after surgery is 80-180 mg/dL.   WHAT DO I DO ABOUT MY DIABETES MEDICATION?  Do not take oral diabetes medicines (pills) the morning of surgery.  HOLD farxiga 72 hours before  surgery.  THE NIGHT BEFORE SURGERY, DO NOT take humalog (lispro) insulin dose. Take metformin as usual.      THE MORNING OF SURGERY, If your CBG is greater than 220 mg/dL, you may take  of your sliding scale  (correction) dose of insulin. DO NOT TAKE ANY ORAL DIABETIC MEDICATIONS DAY OF YOUR SURGERY  DO NOT TAKE THE FOLLOWING 7 DAYS PRIOR TO SURGERY: Ozempic, Wegovy, Rybelsus (Semaglutide), Byetta (exenatide), Bydureon (exenatide ER), Victoza, Saxenda (liraglutide), or Trulicity (dulaglutide) Mounjaro (Tirzepatide) Adlyxin (Lixisenatide), Polyethylene Glycol Loxenatide.                              You may not have any metal on your body including hair pins, jewelry, and body piercing             Do not wear lotions, powders, perfumes/cologne, or deodorant              Men may shave face and neck.   Do not bring valuables to the hospital. [place].   Contacts, dentures or bridgework may not be worn into surgery.   Bring small overnight bag day of surgery.   DO NOT [place]. PHARMACY WILL DISPENSE MEDICATIONS LISTED ON YOUR MEDICATION LIST TO YOU DURING YOUR ADMISSION [place]!    Patients discharged on the day of surgery will not be allowed to drive home.  Someone NEEDS to stay with you for the first 24 hours after anesthesia.   Special Instructions: Bring a copy of your healthcare power of attorney and living will documents         the  day of surgery if you haven't scanned them before.              Please read over the following fact sheets you were given: IF YOU HAVE QUESTIONS ABOUT YOUR PRE-OP INSTRUCTIONS PLEASE CALL [phone]     [facility] - Preparing for Surgery Before surgery, you can play an important role.  Because skin is not sterile, your skin needs to be as free of germs as possible.  You can reduce the number of germs on your skin by washing with CHG (chlorahexidine  gluconate) soap before surgery.  CHG is an antiseptic cleaner which kills germs and bonds with the skin to continue killing germs even after washing. Please DO NOT use if you have an allergy to CHG or antibacterial soaps.  If your skin becomes reddened/irritated stop using the CHG and inform your nurse when you arrive at Short Stay. Do not shave (including legs and underarms) for at least 48 hours prior to the first CHG shower.  You may shave your face/neck. Please follow these instructions carefully:  1.  Shower with CHG Soap the night before surgery and the  morning of Surgery.  2.  If you choose to wash your hair, wash your hair first as usual with your  normal  shampoo.  3.  After you shampoo, rinse your hair and body thoroughly to remove the  shampoo.                           4.  Use CHG as you would any other liquid soap.  You can apply chg directly  to the skin and wash                       Gently with a scrungie or clean washcloth.  5.  Apply the CHG Soap to your body ONLY FROM THE NECK DOWN.   Do not use on face/ open                           Wound or open sores. Avoid contact with eyes, ears mouth and genitals (private parts).                       Wash face,  Genitals (private parts) with your normal soap.             6.  Wash thoroughly, paying special attention to the area where your surgery  will be performed.  7.  Thoroughly rinse your body with warm water from the neck down.  8.  DO NOT shower/wash with your normal soap after using and rinsing off  the CHG Soap.                9.  Pat yourself dry with a clean towel.            10.  Wear clean pajamas.            11.  Place clean sheets on your bed the night of your first shower and do not  sleep with pets. Day of Surgery : Do not apply any lotions/deodorants the morning of surgery.  Please wear clean clothes to the hospital/surgery center.  FAILURE TO FOLLOW THESE INSTRUCTIONS MAY RESULT IN THE CANCELLATION OF YOUR  SURGERY PATIENT SIGNATURE_________________________________  NURSE SIGNATURE__________________________________  ________________________________________________________________________

## 2022-04-27 ENCOUNTER — Encounter (HOSPITAL_COMMUNITY): Payer: Self-pay

## 2022-04-27 ENCOUNTER — Encounter (HOSPITAL_COMMUNITY)
Admission: RE | Admit: 2022-04-27 | Discharge: 2022-04-27 | Disposition: A | Discharge status: Home / Self Care | Payer: Medicare Other | Source: Ambulatory Visit | Attending: Urology | Admitting: Urology

## 2022-04-27 ENCOUNTER — Other Ambulatory Visit: Payer: Self-pay

## 2022-04-27 VITALS — BP 129/76 | HR 80 | Temp 97.8°F | Ht 72.0 in | Wt 214.0 lb

## 2022-04-27 DIAGNOSIS — Z01818 Encounter for other preprocedural examination: Secondary | ICD-10-CM | POA: Diagnosis present

## 2022-04-27 DIAGNOSIS — Z794 Long term (current) use of insulin: Secondary | ICD-10-CM | POA: Diagnosis not present

## 2022-04-27 DIAGNOSIS — E1165 Type 2 diabetes mellitus with hyperglycemia: Secondary | ICD-10-CM | POA: Diagnosis not present

## 2022-04-27 DIAGNOSIS — I10 Essential (primary) hypertension: Secondary | ICD-10-CM | POA: Diagnosis not present

## 2022-04-27 LAB — CBC
HCT: 55.9 % — ABNORMAL HIGH (ref 39.0–52.0)
Hemoglobin: 17.5 g/dL — ABNORMAL HIGH (ref 13.0–17.0)
MCH: 27.5 pg (ref 26.0–34.0)
MCHC: 31.3 g/dL (ref 30.0–36.0)
MCV: 87.9 fL (ref 80.0–100.0)
Platelets: 175 10*3/uL (ref 150–400)
RBC: 6.36 MIL/uL — ABNORMAL HIGH (ref 4.22–5.81)
RDW: 17.2 % — ABNORMAL HIGH (ref 11.5–15.5)
WBC: 9.6 10*3/uL (ref 4.0–10.5)
nRBC: 0 % (ref 0.0–0.2)

## 2022-04-27 LAB — BASIC METABOLIC PANEL
Anion gap: 9 (ref 5–15)
BUN: 33 mg/dL — ABNORMAL HIGH (ref 8–23)
CO2: 24 mmol/L (ref 22–32)
Calcium: 10.7 mg/dL — ABNORMAL HIGH (ref 8.9–10.3)
Chloride: 105 mmol/L (ref 98–111)
Creatinine, Ser: 0.98 mg/dL (ref 0.61–1.24)
GFR, Estimated: >60 mL/min (ref >60)
Glucose, Bld: 198 mg/dL — ABNORMAL HIGH (ref 70–99)
Potassium: 5 mmol/L (ref 3.5–5.1)
Sodium: 138 mmol/L (ref 135–145)

## 2022-04-27 LAB — HEMOGLOBIN A1C
Hgb A1c MFr Bld: 8.6 % — ABNORMAL HIGH (ref 4.8–5.6)
Mean Plasma Glucose: 200.12 mg/dL

## 2022-04-27 LAB — GLUCOSE, CAPILLARY: Glucose-Capillary: 241 mg/dL — ABNORMAL HIGH (ref 70–99)

## 2022-04-27 NOTE — Progress Notes (Signed)
Lab. Results: A1C: 8.6 Hemoglobin: 17.5 HCT: 55.9

## 2022-04-27 NOTE — Progress Notes (Signed)
For Short Stay: [place] appointment date: Date of COVID positive in last 32 days:  Bowel Prep reminder: RN went over with the pt. Several times about the prep for the day before surgery.RN wrote down the name of the prep solution that pt. Needs to get from pharmacy,also emphasize several times about the clear liquid diet, that will start the day before surgery.Pt. also was instructed about farxiga, and to stop to take it 3 days before(it is written on the medication bottle with red ink to hold it starting 05/10/22.Last dose should be 05/09/22).The patient. Also was informed to take half of insulin the night before,and to take half the morning of surgery if CBG is 220 or greater.   For Anesthesia: PCP - Dr. Charlott Rakes Cardiologist - Dr. Shelva Majestic  Chest x-ray -  EKG -  Stress Test -  ECHO - 01/09/19 Cardiac Cath - 2020 Pacemaker/ICD device last checked: Pacemaker orders received: Device Rep notified:  Spinal Cord Stimulator:  Sleep Study -  CPAP -   Fasting Blood Sugar - 140's Checks Blood Sugar ___2__ times a day Date and result of last Hgb A1c-  Blood Thinner Instructions: Aspirin Instructions: Last Dose:  Activity level: Can go up a flight of stairs and activities of daily living without stopping and without chest pain and/or shortness of breath   Able to exercise without chest pain and/or shortness of breath   Unable to go up a flight of stairs without chest pain and/or shortness of breath     Anesthesia review: Hx: CAD,DIA,HTN  Patient denies shortness of breath, fever, cough and chest pain at PAT appointment   Patient verbalized understanding of instructions that were given to them at the PAT appointment. Patient was also instructed that they will need to review over the PAT instructions again at home before surgery.

## 2022-05-04 NOTE — Progress Notes (Signed)
Anesthesia Chart Review   Case: [id number] Date/Time: 05/13/22 0700   Procedures:      XI ROBOTIC ASSISTED LAPAROSCOPIC NEPHRECTOMY (Left) - 3 HRS     RETROPERITONEAL LYMPH NODE DISSECTIONLYMPH NODE DISSECTION (Left)   Anesthesia type: General   Pre-op diagnosis: RENAL MASS   Location: [facility]OM 03 / [facility]   Surgeons: Alexis Frock, MD       DISCUSSION:66 y.o. smoker with h/o HTN, CAD, DM II, renal mass scheduled for above procedure 05/13/2022 with Dr. Alexis Frock.   H/o cocaine use.   Nonobstructive CAD on cath in 2020.  VS: BP 129/76   Pulse 80   Temp 36.6 C (Oral)   Ht 6' (1.829 m)   Wt 97.1 kg   SpO2 96%   BMI 29.02 kg/m   PROVIDERS: System, Provider Not In   LABS: Labs reviewed: Acceptable for surgery. (all labs ordered are listed, but only abnormal results are displayed)  Labs Reviewed  HEMOGLOBIN A1C - Abnormal; Notable for the following components:      Result Value   Hgb A1c MFr Bld 8.6 (*)    All other components within normal limits  BASIC METABOLIC PANEL - Abnormal; Notable for the following components:   Glucose, Bld 198 (*)    BUN 33 (*)    Calcium 10.7 (*)    All other components within normal limits  CBC - Abnormal; Notable for the following components:   RBC 6.36 (*)    Hemoglobin 17.5 (*)    HCT 55.9 (*)    RDW 17.2 (*)    All other components within normal limits  GLUCOSE, CAPILLARY - Abnormal; Notable for the following components:   Glucose-Capillary 241 (*)    All other components within normal limits     IMAGES:   EKG:   CV: Echo 01/09/2019 1. The left ventricle has normal systolic function with an ejection  fraction of 60-65%. The cavity size was normal. There is mildly increased  left ventricular wall thickness. Left ventricular diastolic Doppler  parameters are consistent with impaired  relaxation. No evidence of left ventricular regional wall motion  abnormalities.   2. The right ventricle has normal systolic function.  The cavity was  normal. There is no increase in right ventricular wall thickness.   3. No evidence of mitral valve stenosis. No significant mitral  regurgitation.   4. The aortic valve is tricuspid. No stenosis of the aortic valve.   5. The aortic root is normal in size and structure.   6. 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   7. Normal IVC size. No complete TR doppler jet so unable to estimate PA  systolic pressure.   Cardiac Cath 01/08/2019 Prox RCA-1 lesion is 20% stenosed. Prox RCA-2 lesion is 20% stenosed. Dist RCA lesion is 20% stenosed. Dist LAD lesion is 10% stenosed. Mid LAD lesion is 20% stenosed. The left ventricular systolic function is normal. LV end diastolic pressure is normal.   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  The left circumflex coronary artery is normal.  The right coronary artery has mild 20% proximal mid stenosis and a small area of muscle bridging in the distal vessel proximal to the PDA takeoff.   Normal LV function with EF 55% without focal segmental wall motion abnormalities.  LVEDP 14 mm.   RECOMMENDATION:  The patient denied any chest pain prior to his syncopal spell.  He had transient 1 mm inferior ST elevation with resolution on subsequent ECG suggesting potential transient spasm.  Patient was hypotensive requiring fluid bolus and levophed.  At the completion of the procedure blood pressure was 111/67 on a reduced dose of Levophed with plans to wean as blood pressure allows. Past Medical History:  Diagnosis Date   Arthritis    All over   CAD (coronary artery disease), native coronary artery, minimal by cardiac cath 01/08/19 01/09/2019   Chronic pain syndrome    Diabetes mellitus    History of cocaine use    History of kidney stones    Hypercholesteremia    Hypertension    Low back pain     Lumbar radiculopathy    Right leg pain    Seborrheic dermatitis of scalp    Shingles     Past Surgical History:  Procedure Laterality Date   CIRCUMCISION N/A 03/16/2018   Procedure: CIRCUMCISION ADULT WITH PENILE BLOCK;  Surgeon: Alexis Frock, MD;  [facility];  Service: Urology;  Laterality: N/A;  75 MINS   KNEE ARTHROSCOPY Bilateral    LEFT HEART CATH AND CORONARY ANGIOGRAPHY N/A 01/08/2019   Procedure: LEFT HEART CATH AND CORONARY ANGIOGRAPHY;  Surgeon: Troy Sine, MD;  Location: [place] [facility];  Service: Cardiovascular;  Laterality: N/A;    MEDICATIONS:  acetaminophen (TYLENOL) 650 MG CR tablet   atorvastatin (LIPITOR) 40 MG tablet   atorvastatin (LIPITOR) 80 MG tablet   baclofen (LIORESAL) 10 MG tablet   cetirizine (ZYRTEC) 10 MG tablet   dapagliflozin propanediol (FARXIGA) 10 MG TABS tablet   diclofenac (VOLTAREN) 50 MG EC tablet   docusate sodium (COLACE) 100 MG capsule   doxycycline (VIBRAMYCIN) 100 MG capsule   gabapentin (NEURONTIN) 300 MG capsule   glucose blood (ACCU-CHEK GUIDE) test strip   Insulin Lispro Prot & Lispro (HUMALOG MIX 75/25 KWIKPEN) (75-25) 100 UNIT/ML Kwikpen   Insulin Pen Needle (BD PEN NEEDLE NANO 2ND GEN) 32G X 4 MM MISC   Lancets (ACCU-CHEK SOFT TOUCH) lancets   levocetirizine (XYZAL) 5 MG tablet   metFORMIN (GLUCOPHAGE) 1000 MG tablet   omega-3 acid ethyl esters (LOVAZA) 1 g capsule   oxyCODONE-acetaminophen (PERCOCET/ROXICET) 5-325 MG tablet   No current facility-administered medications for this encounter.    Konrad Felix Ward, PA-C [facility] [phone]

## 2022-05-12 NOTE — Anesthesia Preprocedure Evaluation (Addendum)
Anesthesia Evaluation  Patient identified by MRN, date of birth, ID band Patient awake    Reviewed: Allergy & Precautions, NPO status , Patient's Chart, lab work & pertinent test results  History of Anesthesia Complications Negative for: history of anesthetic complications  Airway Mallampati: II  TM Distance: >3 FB Neck ROM: Full    Dental  (+) Poor Dentition, Missing, Chipped, Dental Advisory Given   Pulmonary Current SmokerPatient did not abstain from smoking.   breath sounds clear to auscultation       Cardiovascular hypertension (no meds), + CAD ('20 cath: minimal)   Rhythm:Regular Rate:Normal  '20 ECHO: EF 60-65%, normal LVF, normal RVF, no significant valvular abnormalities   Neuro/Psych Chronic back pain    GI/Hepatic negative GI ROS, Neg liver ROS,,,  Endo/Other  diabetes (glu 168), Insulin Dependent, Oral Hypoglycemic Agents    Renal/GU Renal mass     Musculoskeletal  (+) Arthritis ,    Abdominal   Peds  Hematology negative hematology ROS (+)   Anesthesia Other Findings   Reproductive/Obstetrics                              Anesthesia Physical Anesthesia Plan  ASA: 3  Anesthesia Plan: General   Post-op Pain Management: Tylenol PO (pre-op)*   Induction: Intravenous  PONV Risk Score and Plan: 1 and Ondansetron and Dexamethasone  Airway Management Planned: Oral ETT  Additional Equipment: None  Intra-op Plan:   Post-operative Plan: Extubation in OR  Informed Consent: I have reviewed the patients History and Physical, chart, labs and discussed the procedure including the risks, benefits and alternatives for the proposed anesthesia with the patient or authorized representative who has indicated his/her understanding and acceptance.     Dental advisory given  Plan Discussed with: CRNA and Surgeon  Anesthesia Plan Comments:          Anesthesia Quick  Evaluation

## 2022-05-13 ENCOUNTER — Other Ambulatory Visit: Payer: Self-pay

## 2022-05-13 ENCOUNTER — Inpatient Hospital Stay (HOSPITAL_COMMUNITY): Payer: Medicare Other | Admitting: Physician Assistant

## 2022-05-13 ENCOUNTER — Encounter (HOSPITAL_COMMUNITY)
Admission: RE | Disposition: A | Discharge status: Home / Self Care | Payer: Self-pay | Source: Home / Self Care | Attending: Urology

## 2022-05-13 ENCOUNTER — Inpatient Hospital Stay (HOSPITAL_COMMUNITY)
Admission: RE | Admit: 2022-05-13 | Discharge: 2022-05-14 | DRG: 658 | Disposition: A | Discharge status: Home / Self Care | Payer: Medicare Other | Attending: Urology | Admitting: Urology

## 2022-05-13 ENCOUNTER — Inpatient Hospital Stay (HOSPITAL_COMMUNITY): Payer: Medicare Other | Admitting: Certified Registered"

## 2022-05-13 ENCOUNTER — Encounter (HOSPITAL_COMMUNITY): Payer: Self-pay | Admitting: Urology

## 2022-05-13 DIAGNOSIS — C642 Malignant neoplasm of left kidney, except renal pelvis: Principal | ICD-10-CM | POA: Diagnosis present

## 2022-05-13 DIAGNOSIS — Z79899 Other long term (current) drug therapy: Secondary | ICD-10-CM

## 2022-05-13 DIAGNOSIS — M5416 Radiculopathy, lumbar region: Secondary | ICD-10-CM | POA: Diagnosis present

## 2022-05-13 DIAGNOSIS — Z823 Family history of stroke: Secondary | ICD-10-CM | POA: Diagnosis not present

## 2022-05-13 DIAGNOSIS — R590 Localized enlarged lymph nodes: Secondary | ICD-10-CM | POA: Diagnosis present

## 2022-05-13 DIAGNOSIS — E669 Obesity, unspecified: Secondary | ICD-10-CM | POA: Diagnosis present

## 2022-05-13 DIAGNOSIS — E7800 Pure hypercholesterolemia, unspecified: Secondary | ICD-10-CM | POA: Diagnosis present

## 2022-05-13 DIAGNOSIS — N2889 Other specified disorders of kidney and ureter: Secondary | ICD-10-CM | POA: Diagnosis present

## 2022-05-13 DIAGNOSIS — F17210 Nicotine dependence, cigarettes, uncomplicated: Secondary | ICD-10-CM | POA: Diagnosis present

## 2022-05-13 DIAGNOSIS — E119 Type 2 diabetes mellitus without complications: Secondary | ICD-10-CM | POA: Diagnosis present

## 2022-05-13 DIAGNOSIS — Z833 Family history of diabetes mellitus: Secondary | ICD-10-CM

## 2022-05-13 DIAGNOSIS — I2510 Atherosclerotic heart disease of native coronary artery without angina pectoris: Secondary | ICD-10-CM

## 2022-05-13 DIAGNOSIS — Z01818 Encounter for other preprocedural examination: Secondary | ICD-10-CM

## 2022-05-13 DIAGNOSIS — Z87442 Personal history of urinary calculi: Secondary | ICD-10-CM

## 2022-05-13 DIAGNOSIS — Z91199 Patient's noncompliance with other medical treatment and regimen due to unspecified reason: Secondary | ICD-10-CM

## 2022-05-13 DIAGNOSIS — Z8249 Family history of ischemic heart disease and other diseases of the circulatory system: Secondary | ICD-10-CM

## 2022-05-13 DIAGNOSIS — Z7984 Long term (current) use of oral hypoglycemic drugs: Secondary | ICD-10-CM

## 2022-05-13 DIAGNOSIS — Z6829 Body mass index (BMI) 29.0-29.9, adult: Secondary | ICD-10-CM

## 2022-05-13 DIAGNOSIS — Z794 Long term (current) use of insulin: Secondary | ICD-10-CM

## 2022-05-13 DIAGNOSIS — G894 Chronic pain syndrome: Secondary | ICD-10-CM | POA: Diagnosis present

## 2022-05-13 DIAGNOSIS — I10 Essential (primary) hypertension: Secondary | ICD-10-CM | POA: Diagnosis present

## 2022-05-13 HISTORY — PX: ROBOT ASSISTED LAPAROSCOPIC NEPHRECTOMY: SHX5140

## 2022-05-13 HISTORY — PX: LYMPH NODE DISSECTION: SHX5087

## 2022-05-13 LAB — GLUCOSE, CAPILLARY
Glucose-Capillary: 168 mg/dL — ABNORMAL HIGH (ref 70–99)
Glucose-Capillary: 121 mg/dL — ABNORMAL HIGH (ref 70–99)
Glucose-Capillary: 196 mg/dL — ABNORMAL HIGH (ref 70–99)
Glucose-Capillary: 199 mg/dL — ABNORMAL HIGH (ref 70–99)
Glucose-Capillary: 205 mg/dL — ABNORMAL HIGH (ref 70–99)
Glucose-Capillary: 215 mg/dL — ABNORMAL HIGH (ref 70–99)
Glucose-Capillary: 191 mg/dL — ABNORMAL HIGH (ref 70–99)

## 2022-05-13 LAB — TYPE AND SCREEN
ABO/RH(D): O POS
Antibody Screen: NEGATIVE

## 2022-05-13 LAB — HEMOGLOBIN AND HEMATOCRIT, BLOOD
HCT: 52.6 % — ABNORMAL HIGH (ref 39.0–52.0)
Hemoglobin: 16.3 g/dL (ref 13.0–17.0)

## 2022-05-13 SURGERY — NEPHRECTOMY, RADICAL, ROBOT-ASSISTED, LAPAROSCOPIC, ADULT
Anesthesia: General | Laterality: Left

## 2022-05-13 MED ORDER — OXYCODONE HCL 5 MG PO TABS: 5.0000 mg | ORAL_TABLET | Freq: Once | ORAL | Status: DC | PRN

## 2022-05-13 MED ORDER — ROCURONIUM BROMIDE 10 MG/ML (PF) SYRINGE
PREFILLED_SYRINGE | INTRAVENOUS | Status: AC
  Filled 2022-05-13: qty 10

## 2022-05-13 MED ORDER — MIDAZOLAM HCL 2 MG/2ML IJ SOLN
INTRAMUSCULAR | Status: DC | PRN
  Administered 2022-05-13: 2 mg via INTRAVENOUS

## 2022-05-13 MED ORDER — BUPIVACAINE LIPOSOME 1.3 % IJ SUSP
INTRAMUSCULAR | Status: AC
  Filled 2022-05-13: qty 20

## 2022-05-13 MED ORDER — ROCURONIUM BROMIDE 10 MG/ML (PF) SYRINGE
PREFILLED_SYRINGE | INTRAVENOUS | Status: DC | PRN
  Administered 2022-05-13: 30 mg via INTRAVENOUS
  Administered 2022-05-13: 70 mg via INTRAVENOUS

## 2022-05-13 MED ORDER — HYDROMORPHONE HCL 1 MG/ML IJ SOLN
0.2500 mg | INTRAMUSCULAR | Status: DC | PRN
  Administered 2022-05-13 (×4): 0.5 mg via INTRAVENOUS

## 2022-05-13 MED ORDER — DEXAMETHASONE SODIUM PHOSPHATE 10 MG/ML IJ SOLN
INTRAMUSCULAR | Status: AC
  Filled 2022-05-13: qty 1

## 2022-05-13 MED ORDER — INSULIN ASPART 100 UNIT/ML IJ SOLN
0.0000 [IU] | Freq: Three times a day (TID) | INTRAMUSCULAR | Status: DC
  Administered 2022-05-13: 5 [IU] via SUBCUTANEOUS
  Administered 2022-05-14 (×2): 3 [IU] via SUBCUTANEOUS

## 2022-05-13 MED ORDER — PROMETHAZINE HCL 25 MG/ML IJ SOLN: 6.2500 mg | INTRAMUSCULAR | Status: DC | PRN

## 2022-05-13 MED ORDER — ONDANSETRON HCL 4 MG/2ML IJ SOLN
INTRAMUSCULAR | Status: AC
  Filled 2022-05-13: qty 2

## 2022-05-13 MED ORDER — SUGAMMADEX SODIUM 200 MG/2ML IV SOLN
INTRAVENOUS | Status: DC | PRN
  Administered 2022-05-13: 200 mg via INTRAVENOUS

## 2022-05-13 MED ORDER — MEPERIDINE HCL 50 MG/ML IJ SOLN: 6.2500 mg | INTRAMUSCULAR | Status: DC | PRN

## 2022-05-13 MED ORDER — MIDAZOLAM HCL 2 MG/2ML IJ SOLN
0.5000 mg | Freq: Once | INTRAMUSCULAR | Status: AC | PRN
  Administered 2022-05-13: 0.5 mg via INTRAVENOUS

## 2022-05-13 MED ORDER — HYDROMORPHONE HCL 1 MG/ML IJ SOLN
INTRAMUSCULAR | Status: AC
  Filled 2022-05-13: qty 2

## 2022-05-13 MED ORDER — SODIUM CHLORIDE (PF) 0.9 % IJ SOLN
INTRAMUSCULAR | Status: AC
  Filled 2022-05-13: qty 10

## 2022-05-13 MED ORDER — SODIUM CHLORIDE (PF) 0.9 % IJ SOLN
INTRAMUSCULAR | Status: DC | PRN
  Administered 2022-05-13: 20 mL

## 2022-05-13 MED ORDER — PHENYLEPHRINE 80 MCG/ML (10ML) SYRINGE FOR IV PUSH (FOR BLOOD PRESSURE SUPPORT)
PREFILLED_SYRINGE | INTRAVENOUS | Status: DC | PRN
  Administered 2022-05-13: 120 ug via INTRAVENOUS
  Administered 2022-05-13: 80 ug via INTRAVENOUS

## 2022-05-13 MED ORDER — LACTATED RINGERS IV SOLN: INTRAVENOUS | Status: DC | PRN

## 2022-05-13 MED ORDER — LIDOCAINE 2% (20 MG/ML) 5 ML SYRINGE
INTRAMUSCULAR | Status: DC | PRN
  Administered 2022-05-13: 60 mg via INTRAVENOUS

## 2022-05-13 MED ORDER — PROPOFOL 10 MG/ML IV BOLUS
INTRAVENOUS | Status: AC
  Filled 2022-05-13: qty 20

## 2022-05-13 MED ORDER — STERILE WATER FOR IRRIGATION IR SOLN
Status: DC | PRN
  Administered 2022-05-13: 1000 mL

## 2022-05-13 MED ORDER — BUPIVACAINE LIPOSOME 1.3 % IJ SUSP
INTRAMUSCULAR | Status: DC | PRN
  Administered 2022-05-13: 20 mL

## 2022-05-13 MED ORDER — KETAMINE HCL 10 MG/ML IJ SOLN
INTRAMUSCULAR | Status: AC
  Filled 2022-05-13: qty 1

## 2022-05-13 MED ORDER — FENTANYL CITRATE (PF) 100 MCG/2ML IJ SOLN
INTRAMUSCULAR | Status: AC
  Filled 2022-05-13: qty 2

## 2022-05-13 MED ORDER — ACETAMINOPHEN 500 MG PO TABS
1000.0000 mg | ORAL_TABLET | Freq: Four times a day (QID) | ORAL | Status: AC
  Administered 2022-05-13 – 2022-05-14 (×4): 1000 mg via ORAL
  Filled 2022-05-13 (×4): qty 2

## 2022-05-13 MED ORDER — CEFAZOLIN SODIUM-DEXTROSE 2-4 GM/100ML-% IV SOLN
2.0000 g | INTRAVENOUS | Status: AC
  Administered 2022-05-13: 2 g via INTRAVENOUS
  Filled 2022-05-13: qty 100

## 2022-05-13 MED ORDER — HYOSCYAMINE SULFATE 0.125 MG SL SUBL: 0.1250 mg | SUBLINGUAL_TABLET | SUBLINGUAL | Status: DC | PRN

## 2022-05-13 MED ORDER — TRIPLE ANTIBIOTIC 3.5-400-5000 EX OINT: 1.0000 | TOPICAL_OINTMENT | Freq: Three times a day (TID) | CUTANEOUS | Status: DC | PRN

## 2022-05-13 MED ORDER — ONDANSETRON HCL 4 MG/2ML IJ SOLN
INTRAMUSCULAR | Status: DC | PRN
  Administered 2022-05-13: 4 mg via INTRAVENOUS

## 2022-05-13 MED ORDER — PHENYLEPHRINE HCL-NACL 20-0.9 MG/250ML-% IV SOLN
INTRAVENOUS | Status: DC | PRN
  Administered 2022-05-13: 20 ug/min via INTRAVENOUS

## 2022-05-13 MED ORDER — OXYCODONE HCL 5 MG/5ML PO SOLN: 5.0000 mg | Freq: Once | ORAL | Status: DC | PRN

## 2022-05-13 MED ORDER — SODIUM CHLORIDE 0.45 % IV SOLN: INTRAVENOUS | Status: DC

## 2022-05-13 MED ORDER — MIDAZOLAM HCL 2 MG/2ML IJ SOLN
INTRAMUSCULAR | Status: AC
  Filled 2022-05-13: qty 2

## 2022-05-13 MED ORDER — DIPHENHYDRAMINE HCL 12.5 MG/5ML PO ELIX: 12.5000 mg | ORAL_SOLUTION | Freq: Four times a day (QID) | ORAL | Status: DC | PRN

## 2022-05-13 MED ORDER — DOCUSATE SODIUM 100 MG PO CAPS
100.0000 mg | ORAL_CAPSULE | Freq: Two times a day (BID) | ORAL | Status: DC
  Administered 2022-05-13: 100 mg via ORAL
  Filled 2022-05-13 (×2): qty 1

## 2022-05-13 MED ORDER — PROPOFOL 10 MG/ML IV BOLUS
INTRAVENOUS | Status: DC | PRN
  Administered 2022-05-13: 150 mg via INTRAVENOUS

## 2022-05-13 MED ORDER — ORAL CARE MOUTH RINSE: 15.0000 mL | Freq: Once | OROMUCOSAL | Status: AC

## 2022-05-13 MED ORDER — CHLORHEXIDINE GLUCONATE 0.12 % MT SOLN
15.0000 mL | Freq: Once | OROMUCOSAL | Status: AC
  Administered 2022-05-13: 15 mL via OROMUCOSAL

## 2022-05-13 MED ORDER — LIDOCAINE 20MG/ML (2%) 15 ML SYRINGE OPTIME
INTRAMUSCULAR | Status: DC | PRN
  Administered 2022-05-13: 1 mg/kg/h via INTRAVENOUS

## 2022-05-13 MED ORDER — FENTANYL CITRATE (PF) 100 MCG/2ML IJ SOLN
INTRAMUSCULAR | Status: DC | PRN
  Administered 2022-05-13: 50 ug via INTRAVENOUS
  Administered 2022-05-13: 150 ug via INTRAVENOUS

## 2022-05-13 MED ORDER — DIPHENHYDRAMINE HCL 50 MG/ML IJ SOLN: 12.5000 mg | Freq: Four times a day (QID) | INTRAMUSCULAR | Status: DC | PRN

## 2022-05-13 MED ORDER — HYDROMORPHONE HCL 1 MG/ML IJ SOLN
0.5000 mg | INTRAMUSCULAR | Status: DC | PRN
  Administered 2022-05-13 – 2022-05-14 (×6): 1 mg via INTRAVENOUS
  Filled 2022-05-13 (×6): qty 1

## 2022-05-13 MED ORDER — OXYCODONE HCL 5 MG PO TABS
5.0000 mg | ORAL_TABLET | ORAL | Status: DC | PRN
  Administered 2022-05-13 – 2022-05-14 (×3): 5 mg via ORAL
  Filled 2022-05-13 (×4): qty 1

## 2022-05-13 MED ORDER — DEXAMETHASONE SODIUM PHOSPHATE 10 MG/ML IJ SOLN
INTRAMUSCULAR | Status: DC | PRN
  Administered 2022-05-13: 4 mg via INTRAVENOUS

## 2022-05-13 MED ORDER — EPHEDRINE SULFATE-NACL 50-0.9 MG/10ML-% IV SOSY
PREFILLED_SYRINGE | INTRAVENOUS | Status: DC | PRN
  Administered 2022-05-13: 10 mg via INTRAVENOUS

## 2022-05-13 MED ORDER — LACTATED RINGERS IR SOLN
Status: DC | PRN
  Administered 2022-05-13: 1000 mL

## 2022-05-13 MED ORDER — ORAL CARE MOUTH RINSE: 15.0000 mL | OROMUCOSAL | Status: DC | PRN

## 2022-05-13 MED ORDER — ACETAMINOPHEN 500 MG PO TABS
1000.0000 mg | ORAL_TABLET | Freq: Once | ORAL | Status: DC
  Filled 2022-05-13: qty 2

## 2022-05-13 MED ORDER — MAGNESIUM CITRATE PO SOLN: 1.0000 | Freq: Once | ORAL | Status: DC

## 2022-05-13 MED ORDER — LACTATED RINGERS IV SOLN: INTRAVENOUS | Status: DC

## 2022-05-13 MED ORDER — ATORVASTATIN CALCIUM 40 MG PO TABS
40.0000 mg | ORAL_TABLET | Freq: Every day | ORAL | Status: DC
  Administered 2022-05-13 – 2022-05-14 (×2): 40 mg via ORAL
  Filled 2022-05-13 (×2): qty 1

## 2022-05-13 MED ORDER — KETAMINE HCL 10 MG/ML IJ SOLN
INTRAMUSCULAR | Status: DC | PRN
  Administered 2022-05-13 (×5): 10 mg via INTRAVENOUS

## 2022-05-13 MED ORDER — ONDANSETRON HCL 4 MG/2ML IJ SOLN: 4.0000 mg | INTRAMUSCULAR | Status: DC | PRN

## 2022-05-13 MED ORDER — OXYCODONE-ACETAMINOPHEN 5-325 MG PO TABS: 1.0000 | ORAL_TABLET | Freq: Four times a day (QID) | ORAL | 0 refills | Status: DC | PRN

## 2022-05-13 SURGICAL SUPPLY — 65 items
ADH SKN CLS APL DERMABOND .7 (GAUZE/BANDAGES/DRESSINGS) ×1
ADH SKN CLS LQ APL DERMABOND (GAUZE/BANDAGES/DRESSINGS) ×1
APL PRP STRL LF DISP 70% ISPRP (MISCELLANEOUS) ×1
BAG COUNTER SPONGE SURGICOUNT (BAG) ×2 IMPLANT
BAG LAPAROSCOPIC 12 15 PORT 16 (BASKET) ×2 IMPLANT
BAG RETRIEVAL 12/15 (BASKET) ×1
BAG SPNG CNTER NS LX DISP (BAG)
CHLORAPREP W/TINT 26 (MISCELLANEOUS) ×2 IMPLANT
CLIP LIGATING HEM O LOK PURPLE (MISCELLANEOUS) ×2 IMPLANT
CLIP LIGATING HEMO LOK XL GOLD (MISCELLANEOUS) ×2 IMPLANT
CLIP LIGATING HEMO O LOK GREEN (MISCELLANEOUS) ×2 IMPLANT
COVER SURGICAL LIGHT HANDLE (MISCELLANEOUS) ×2 IMPLANT
COVER TIP SHEARS 8 DVNC (MISCELLANEOUS) ×2 IMPLANT
COVER TIP SHEARS 8MM DA VINCI (MISCELLANEOUS) ×1
CUTTER ECHEON FLEX ENDO 45 340 (ENDOMECHANICALS) IMPLANT
DERMABOND ADVANCED .7 DNX12 (GAUZE/BANDAGES/DRESSINGS) ×4 IMPLANT
DERMABOND ADVANCED .7 DNX6 (GAUZE/BANDAGES/DRESSINGS) IMPLANT
DRAIN CHANNEL 15F RND FF 3/16 (WOUND CARE) IMPLANT
DRAPE ARM DVNC X/XI (DISPOSABLE) ×8 IMPLANT
DRAPE COLUMN DVNC XI (DISPOSABLE) ×2 IMPLANT
DRAPE DA VINCI XI ARM (DISPOSABLE) ×4
DRAPE DA VINCI XI COLUMN (DISPOSABLE) ×1
DRAPE INCISE IOBAN 66X45 STRL (DRAPES) ×2 IMPLANT
DRAPE SHEET LG 3/4 BI-LAMINATE (DRAPES) ×2 IMPLANT
ELECT PENCIL ROCKER SW 15FT (MISCELLANEOUS) ×2 IMPLANT
ELECT REM PT RETURN 15FT ADLT (MISCELLANEOUS) ×2 IMPLANT
EVACUATOR SILICONE 100CC (DRAIN) IMPLANT
GLOVE BIO SURGEON STRL SZ 6.5 (GLOVE) ×2 IMPLANT
GLOVE SURG LX STRL 7.5 STRW (GLOVE) ×4 IMPLANT
GOWN SRG XL LVL 4 BRTHBL STRL (GOWNS) ×2 IMPLANT
GOWN STRL NON-REIN XL LVL4 (GOWNS) ×1
GOWN STRL REUS W/ TWL XL LVL3 (GOWN DISPOSABLE) ×4 IMPLANT
GOWN STRL REUS W/TWL XL LVL3 (GOWN DISPOSABLE) ×2
HOLDER FOLEY CATH W/STRAP (MISCELLANEOUS) ×2 IMPLANT
IRRIG SUCT STRYKERFLOW 2 WTIP (MISCELLANEOUS) ×1
IRRIGATION SUCT STRKRFLW 2 WTP (MISCELLANEOUS) ×2 IMPLANT
KIT BASIN OR (CUSTOM PROCEDURE TRAY) ×2 IMPLANT
KIT TURNOVER KIT A (KITS) IMPLANT
LOOP VESSEL MAXI BLUE (MISCELLANEOUS) IMPLANT
NDL INSUFFLATION 14GA 120MM (NEEDLE) ×2 IMPLANT
NEEDLE INSUFFLATION 14GA 120MM (NEEDLE) ×1 IMPLANT
PORT ACCESS TROCAR AIRSEAL 12 (TROCAR) ×2 IMPLANT
PORT ACCESS TROCAR AIRSEAL 5M (TROCAR) ×1
PROTECTOR NERVE ULNAR (MISCELLANEOUS) ×4 IMPLANT
RELOAD STAPLE 45 2.6 WHT THIN (STAPLE) IMPLANT
SEAL CANN UNIV 5-8 DVNC XI (MISCELLANEOUS) ×8 IMPLANT
SEAL XI 5MM-8MM UNIVERSAL (MISCELLANEOUS) ×4
SET TRI-LUMEN FLTR TB AIRSEAL (TUBING) ×2 IMPLANT
SOLUTION ELECTROLUBE (MISCELLANEOUS) ×2 IMPLANT
SPIKE FLUID TRANSFER (MISCELLANEOUS) ×2 IMPLANT
SPONGE T-LAP 4X18 ~~LOC~~+RFID (SPONGE) IMPLANT
STAPLE RELOAD 45 WHT (STAPLE) ×3 IMPLANT
STAPLE RELOAD 45MM WHITE (STAPLE) ×3
SUT ETHILON 3 0 PS 1 (SUTURE) IMPLANT
SUT MNCRL AB 4-0 PS2 18 (SUTURE) ×4 IMPLANT
SUT PDS AB 1 CT1 27 (SUTURE) ×6 IMPLANT
SUT VIC AB 2-0 SH 27 (SUTURE) ×1
SUT VIC AB 2-0 SH 27X BRD (SUTURE) ×2 IMPLANT
SUT VICRYL 0 UR6 27IN ABS (SUTURE) IMPLANT
TOWEL OR 17X26 10 PK STRL BLUE (TOWEL DISPOSABLE) ×2 IMPLANT
TRAY FOLEY MTR SLVR 16FR STAT (SET/KITS/TRAYS/PACK) ×2 IMPLANT
TRAY LAPAROSCOPIC (CUSTOM PROCEDURE TRAY) ×2 IMPLANT
TROCAR Z THREAD OPTICAL 12X100 (TROCAR) ×2 IMPLANT
TROCAR Z-THREAD OPTICAL 5X100M (TROCAR) IMPLANT
WATER STERILE IRR 1000ML POUR (IV SOLUTION) ×2 IMPLANT

## 2022-05-13 NOTE — Progress Notes (Signed)
Mobility Specialist - Progress Note  Pre-mobility: 96% SpO2 During mobility: 94% SpO2 Post-mobility: 93% SPO2   05/13/22 1600  Oxygen Therapy  O2 Device Nasal Cannula  O2 Flow Rate (L/min) 3 L/min  Mobility  Activity Ambulated independently in hallway  Level of Assistance Independent after set-up  Assistive Device None  Distance Ambulated (ft) 440 ft  Range of Motion/Exercises Active  Activity Response Tolerated well  Mobility Referral Yes  $Mobility charge 1 Mobility   Pt was found sitting EOB and agreeable to ambulate. Had no complaints during ambulation and at EOS returned to sit EOB with all necessities in reach. RN notified of session.  Ferd Hibbs Mobility Specialist

## 2022-05-13 NOTE — H&P (Signed)
Ryan Farmer is an 66 y.o. male.    Chief Complaint: Pre-Op LEFT Radical Nephrectomy, Retroperitoneal Node Dissection  HPI:   1 - Left Renal. Mass - solid lef lwer pole mass incidetnal on ER CT 2021, did not keep FU. Adiditonal CT 2022 per report but images not agail for review and ageain suspcisiong for stage 2+ renal cancer per report. CT 03/2022 now with 9.6cm left mass and some borderline peri-aortic adenopathy. 1 artery (proximal dense vascular calcifications) / 1 vein left renovascular anatomy. Cr<1.    PMH sig for obesity, lumbago/ back surgery/chronic pain (gabapending and choric opiodes through contract at [place]) , IDDM2 (A1c 7-12s, variable compliance), 50PY smoker, stil smokes. His PCP is Ryan [place] MD with [facility].   Today "Ryan Farmer" is seen to proceed with LEFT robotic radical nephrectomy / node dissection. Cr 0.98, Hgb 17, A1c 8 most recently.   Past Medical History:  Diagnosis Date   Arthritis    All over   CAD (coronary artery disease), native coronary artery, minimal by cardiac cath 01/08/19 01/09/2019   Chronic pain syndrome    Diabetes mellitus    History of cocaine use    History of kidney stones    Hypercholesteremia    Hypertension    Low back pain    Lumbar radiculopathy    Right leg pain    Seborrheic dermatitis of scalp    Shingles     Past Surgical History:  Procedure Laterality Date   CIRCUMCISION N/A 03/16/2018   Procedure: CIRCUMCISION ADULT WITH PENILE BLOCK;  Surgeon: Alexis Frock, MD;  Location: [facility];  Service: Urology;  Laterality: N/A;  65 MINS   KNEE ARTHROSCOPY Bilateral    LEFT HEART CATH AND CORONARY ANGIOGRAPHY N/A 01/08/2019   Procedure: LEFT HEART CATH AND CORONARY ANGIOGRAPHY;  Surgeon: Troy Sine, MD;  Location: [place] [facility];  Service: Cardiovascular;  Laterality: N/A;    Family History  Problem Relation Age of Onset   Cancer Mother    Stroke Father    Heart disease Father    Diabetes  Father    Social History:  reports that he has been smoking cigarettes. He has a 50.00 pack-year smoking history. He has quit using smokeless tobacco.  His smokeless tobacco use included chew. He reports that he does not currently use alcohol. He reports that he does not currently use drugs after having used the following drugs: Marijuana and Cocaine.  Allergies: No Known Allergies  Medications Prior to Admission  Medication Sig Dispense Refill   acetaminophen (TYLENOL) 650 MG CR tablet Take 2-3 tablets by mouth daily as needed for pain.     atorvastatin (LIPITOR) 40 MG tablet Take 40 mg by mouth daily.     dapagliflozin propanediol (FARXIGA) 10 MG TABS tablet Take 1 tablet (10 mg total) by mouth daily before breakfast. 90 tablet 3   Insulin Lispro Prot & Lispro (HUMALOG MIX 75/25 KWIKPEN) (75-25) 100 UNIT/ML Kwikpen Inject 30 Units into the skin in the morning and at bedtime. 60 mL 3   metFORMIN (GLUCOPHAGE) 1000 MG tablet Take 1 tablet (1,000 mg total) by mouth 2 (two) times daily with a meal. 180 tablet 3   oxyCODONE-acetaminophen (PERCOCET/ROXICET) 5-325 MG tablet Take 1 tablet by mouth daily as needed for moderate pain.     atorvastatin (LIPITOR) 80 MG tablet Take 1 tablet (80 mg total) by mouth daily. (Patient not taking: Reported on 04/27/2022) 90 tablet 3   baclofen (LIORESAL) 10  MG tablet TAKE 1 TABLET(10 MG) BY MOUTH TWICE DAILY (Patient not taking: Reported on 04/27/2022) 60 tablet 3   cetirizine (ZYRTEC) 10 MG tablet Take 1 tablet (10 mg total) by mouth daily. (Patient not taking: Reported on 01/13/2022) 30 tablet 0   diclofenac (VOLTAREN) 50 MG EC tablet TAKE 1 TABLET(50 MG) BY MOUTH TWICE DAILY as needed with food (Patient taking differently: Take 50 mg by mouth daily as needed for mild pain. TAKE 1 TABLET(50 MG) BY MOUTH TWICE DAILY as needed with food) 60 tablet 0   docusate sodium (COLACE) 100 MG capsule TAKE 1 CAPSULE(100 MG) BY MOUTH TWICE DAILY (Patient not taking: Reported on  01/13/2022) 40 capsule 0   doxycycline (VIBRAMYCIN) 100 MG capsule Take 1 capsule (100 mg total) by mouth 2 (two) times daily. (Patient not taking: Reported on 01/13/2022) 20 capsule 0   gabapentin (NEURONTIN) 300 MG capsule Take 1 capsule (300 mg total) by mouth 2 (two) times daily AND 2 capsules (600 mg total) at bedtime. (Patient not taking: Reported on 01/13/2022) 360 capsule 0   glucose blood (ACCU-CHEK GUIDE) test strip USE TWICE DAILY AS DIRECTED TO TEST BLOOD SUGAR (Patient not taking: Reported on 01/13/2022) 100 strip 11   Insulin Pen Needle (BD PEN NEEDLE NANO 2ND GEN) 32G X 4 MM MISC Inject 1 Device into the skin in the morning and at bedtime. 200 each 3   Lancets (ACCU-CHEK SOFT TOUCH) lancets Use as instructed to test blood sugar 3 times daily DX E11.42 (Patient not taking: Reported on 01/13/2022) 100 each 12   levocetirizine (XYZAL) 5 MG tablet Take 1 tablet (5 mg total) by mouth every evening. (Patient not taking: Reported on 01/13/2022) 90 tablet 0   omega-3 acid ethyl esters (LOVAZA) 1 g capsule Take 1 capsule (1 g total) by mouth 2 (two) times daily. (Patient not taking: Reported on 04/27/2022) 60 capsule 11    Results for orders placed or performed during the hospital encounter of 05/13/22 (from the past 48 hour(s))  Type and screen [facility]     Status: None (Preliminary result)   Collection Time: 05/13/22  6:00 AM  Result Value Ref Range   ABO/RH(D) PENDING    Antibody Screen PENDING    Sample Expiration      05/16/2022,2359 Performed at [facility], [place] [address], [place], [place] [address]   Glucose, capillary     Status: Abnormal   Collection Time: 05/13/22  6:10 AM  Result Value Ref Range   Glucose-Capillary 168 (H) 70 - 99 mg/dL    Comment: Glucose reference range applies only to samples taken after fasting for at least 8 hours.   Comment 1 Notify RN    Comment 2 Document in Chart    No results found.  Review of Systems   Constitutional:  Negative for chills and fever.  All other systems reviewed and are negative.   Blood pressure 136/78, pulse 67, temperature 97.6 F (36.4 C), resp. rate 18, height 6' (1.829 m), weight 97 kg, SpO2 96 %. Physical Exam Vitals reviewed.  HENT:     Head: Normocephalic.  Eyes:     Pupils: Pupils are equal, round, and reactive to light.  Cardiovascular:     Rate and Rhythm: Normal rate.  Pulmonary:     Effort: Pulmonary effort is normal.  Abdominal:     General: Abdomen is flat.     Comments: Stable truncal obesity.   Genitourinary:    Comments: No  CVAT at present Musculoskeletal:        General: Normal range of motion.  Neurological:     General: No focal deficit present.     Mental Status: He is alert.  Psychiatric:        Mood and Affect: Mood normal.      Assessment/Plan  Proceed as planned with LEFT robotic radical nephrectomy / node dissection for large mass. RIsks, benefits, alternatives, expected peri-op course discussed previously and reiterated today. He understands that his baseline metabolic and pulmonary comorbidity increases risk of peri-op complications including mortality substantially.   Alexis Frock, MD 05/13/2022, 6:28 AM

## 2022-05-13 NOTE — Discharge Instructions (Signed)

## 2022-05-13 NOTE — Anesthesia Postprocedure Evaluation (Addendum)
Anesthesia Post Note  Patient: Ryan Farmer  Procedure(s) Performed: XI ROBOTIC ASSISTED LAPAROSCOPIC NEPHRECTOMY (Left) RETROPERITONEAL LYMPH NODE DISSECTIONLYMPH NODE DISSECTION (Left)     Patient location during evaluation: PACU Anesthesia Type: General Level of consciousness: patient cooperative, oriented and sedated Pain management: pain level controlled Vital Signs Assessment: post-procedure vital signs reviewed and stable Respiratory status: spontaneous breathing, nonlabored ventilation, respiratory function stable and patient connected to nasal cannula oxygen Cardiovascular status: blood pressure returned to baseline and stable Postop Assessment: no apparent nausea or vomiting Anesthetic complications: no   No notable events documented.  Last Vitals:  Vitals:   05/13/22 1145 05/13/22 1202  BP: (!) 158/89 (!) 142/64  Pulse: 98 (!) 101  Resp: 13 18  Temp:  36.6 C  SpO2: 95% 96%               [name],E. [name]

## 2022-05-13 NOTE — Brief Op Note (Signed)
05/13/2022  9:08 AM  PATIENT:  Berenice Bouton Mcbrien  66 y.o. male  PRE-OPERATIVE DIAGNOSIS:  RENAL MASS  POST-OPERATIVE DIAGNOSIS:  RENAL MASS  PROCEDURE:  Procedure(s) with comments: XI ROBOTIC ASSISTED LAPAROSCOPIC NEPHRECTOMY (Left) - 3 HRS RETROPERITONEAL LYMPH NODE DISSECTIONLYMPH NODE DISSECTION (Left)  SURGEON:  Surgeon(s) and Role:    Alexis Frock, MD - Primary  PHYSICIAN ASSISTANT:   ASSISTANTS: Debbrah Alar PA   ANESTHESIA:   local and general  EBL:  25 mL   BLOOD ADMINISTERED:none  DRAINS:  foley to gravity    LOCAL MEDICATIONS USED:  NONE  SPECIMEN:  Source of Specimen:  left radical nephrectomy; para-aortic lymph nodes  DISPOSITION OF SPECIMEN:  PATHOLOGY  COUNTS:  YES  TOURNIQUET:  * No tourniquets in log *  DICTATION: .Other Dictation: Dictation Number [id number]  PLAN OF CARE: Admit to inpatient   PATIENT DISPOSITION:  PACU - hemodynamically stable.   Delay start of Pharmacological VTE agent (>24hrs) due to surgical blood loss or risk of bleeding: no

## 2022-05-13 NOTE — Transfer of Care (Signed)
Immediate Anesthesia Transfer of Care Note  Patient: Ryan Farmer  Procedure(s) Performed: XI ROBOTIC ASSISTED LAPAROSCOPIC NEPHRECTOMY (Left) RETROPERITONEAL LYMPH NODE DISSECTIONLYMPH NODE DISSECTION (Left)  Patient Location: PACU  Anesthesia Type:General  Level of Consciousness: awake and alert   Airway & Oxygen Therapy: Patient Spontanous Breathing and Patient connected to face mask oxygen  Post-op Assessment: Report given to RN and Post -op Vital signs reviewed and stable  Post vital signs: Reviewed and stable  Last Vitals:  Vitals Value Taken Time  BP 212/94 05/13/22 0923  Temp    Pulse 88 05/13/22 0926  Resp 14 05/13/22 0926  SpO2 99 % 05/13/22 0926  Vitals shown include unvalidated device data.  Last Pain:  Vitals:   05/13/22 0553  PainSc: 0-No pain         Complications: No notable events documented.

## 2022-05-13 NOTE — Op Note (Signed)
[name], [name] MEDICAL RECORD NO: [id number] ACCOUNT NO: [id number] DATE OF BIRTH: 01-24-1956 FACILITY: [facility] LOCATION: [facility] PHYSICIAN: Alexis Frock, MD  Operative Report   DATE OF PROCEDURE: 05/13/2022  PREOPERATIVE DIAGNOSIS:  Large left renal mass with borderline periaortic adenopathy.  PROCEDURES PERFORMED:  1.  Left robotic radical nephrectomy. 2.  Retroperitoneal lymph node dissection.  ESTIMATED BLOOD LOSS:  20 mL.  COMPLICATIONS:  None.  SPECIMENS:  1.  Periaortic lymph nodes for permanent pathology. 2.  Left radical nephrectomy.  ASSISTANT:  Debbrah Alar, PA  FINDINGS:  1.  Single artery, single vein, left renovascular anatomy was anticipated. 2.  Borderline adenopathy, periaortic location, some nodes up to approximately 2 cm with some desmoplastic reaction.  INDICATIONS FOR PROCEDURE:  Ryan Farmer is a pleasant 66 year old man with a longstanding smoking history who was found to have significant left renal mass that is expansile and certainly concerning for renal cell carcinoma.  Chest imaging was unremarkable.   No obvious distant disease.  There is some borderline adenopathy on imaging, several periaortic lymph nodes that by my interpretation are highly concerning for possible oligometastatic disease.  Options were discussed for management including  surveillance protocols versus ablative therapy versus surgical extirpation with a goal of maximal local control and staging and he wished to proceed.  Informed consent was obtained and placed in medical record.  PROCEDURE IN DETAIL:  The patient being Ryan Farmer verified and the procedure being a left radical nephrectomy with retroperitoneal lymph node dissection was confirmed. Timeout was performed.  Intravenous antibiotics were administered.  General  endotracheal anesthesia induced.  The patient was placed into a left side up, full flank position and pulling 15 degrees of table flexion, superior arm elevator,  axillary roll, sequential compression devices, bottom leg bent, top leg straight.  He was  further fastened to the operating table using 3-inch tape over foam padding across supraxiphoid chest and his abdomen and an area across his pubic bone.  Sterile field was created by first clipper shaving, then prepping and draping the patient's entire  left flank and abdomen using chlorhexidine gluconate.  Next, a high flow, low pressure pneumoperitoneum using Veress technique in left lower quadrant, having passed the aspiration and drop test.  An 8 mm robotic camera port was then placed in position  approximately one-and-a-half handbreadths superolateral to the umbilicus.  Laparoscopic examination of peritoneal cavity revealed no significant adhesions, no visceral injury.  Additional ports were then placed as follows:  Left subcostal 8 mm robotic  port, left far lateral 8 mm robotic port, approximately 4 fingerbreadths superomedial to the anterior iliac spine, left paramedian inferior robotic port, approximately 1 handbreadth superior to pubic ramus and then two 12 mm assistant port sites in the  midline, one approximately 1 fingerbreadth superior to the plane of the camera port and another approximately 4 fingerbreadths inferior to the plane of the camera port.  Robot was docked, having passed the electronic checks.  Initial attention was  directed at development of retroperitoneum.  Incision made lateral to the descending colon from the area of the splenic flexure, towards the area of the internal ring and the colon and its mesentery were carefully swept medially off the anterior surface  of Gerota's fascia.  Lateral splenic attachments were taken down, allowing the spleen to medially rotate, thus placing the spleen and pancreas on gentle medial traction away from the anterior surface of Gerota's fascia.  This allowed much better  visualization of the kidney area.  Lower  pole of the kidney is identified, placed on  gentle lateral traction.  Dissection proceeded medial to this. The ureter and gonadal vessels were encountered, placed on gentle lateral traction.  Psoas muscle was  identified and dissection proceeded within this triangle towards the area of the renal hilum.  Renal hilum consisted of a single artery, single vein renal vascular anatomy as anticipated.  Also, there were some significant but not very large lymph nodes  in the periaortic location as anticipated.  Some of these appeared to be inferior to and superior to the plane of the hilum.  At this point, the inferior lymph nodes were addressed, but very carefully mobilizing all fibrofatty tissue directly on the  aorta, and positioned from the renal artery, inferior for a distance of approximately 5 cm.  Lymphostasis was achieved with cold clips, set aside labeled periaortic lymph nodes.  This provided excellent window, even more so to the hilum which was  controlled using extra-large Hem-o-lok clip proximal, staple load distal on the renal artery, choosing a plane that was purposely approximately 8 mm distal to the origin at the aorta as some calcifications were noted on imaging and the vein was  controlled using vascular stapler.  There were some additional shoddy lymphatic tissue superior to the plane of the hilum.  This was similarly dissected away from the aorta and sent along with the previous periaortic lymph node specimen.  A plane was  chosen medial to the adrenal thus including the adrenal with the radical nephrectomy specimen and the adrenal was separated away from its medial attachments using vascular stapler.  Superior attachments were taken down with cautery scissors as were  lateral attachments.  The ureter was then doubly clipped and ligated. The gonadal vessels were triply clipped and ligated.  This completely freed up the large left radical nephrectomy specimen and was placed into an extra-large EndoCatch bag for later  retrieval.  Sponge  and needle counts were correct.  Hemostasis was excellent.  Robot was then undocked.  Specimen was retrieved by connecting the 2 previous assistant port sites in the midline, removing the kidney specimen setting aside for permanent  pathology.  Extraction site was closed with fascia using figure-of-eight PDS x 6 followed by reapproximation of Scarpa's with running Vicryl.  All incision sites were infiltrated with dilute lipolyzed Marcaine and closed at the level of the skin using  subcuticular Monocryl followed by Dermabond.  Procedure was then terminated.  The patient tolerated procedure well, no immediate perioperative complications.  The patient was taken to postanesthesia care in stable condition.  Please note, first assistant, Debbrah Alar, was crucial for all portions of the surgery today.  She provided invaluable retraction, suctioning, robotic instrument exchange, vascular clipping, vascular stapling and general first assistance.   MUK D: 05/13/2022 9:16:05 am T: 05/13/2022 9:58:00 am  JOB: [id number]/ [id number]

## 2022-05-13 NOTE — Anesthesia Procedure Notes (Signed)
Procedure Name: Intubation Date/Time: 05/13/2022 7:23 AM  Performed by: Eben Burow, CRNAPre-anesthesia Checklist: Patient identified, Emergency Drugs available, Suction available, Patient being monitored and Timeout performed Patient Re-evaluated:Patient Re-evaluated prior to induction Oxygen Delivery Method: Circle system utilized Preoxygenation: Pre-oxygenation with 100% oxygen Induction Type: IV induction Ventilation: Mask ventilation without difficulty Laryngoscope Size: Mac and 4 Grade View: Grade I Tube type: Oral Tube size: 7.5 mm Number of attempts: 1 Airway Equipment and Method: Stylet Placement Confirmation: ETT inserted through vocal cords under direct vision, positive ETCO2 and breath sounds checked- equal and bilateral Secured at: 22 cm Tube secured with: Tape Dental Injury: Teeth and Oropharynx as per pre-operative assessment

## 2022-05-14 ENCOUNTER — Encounter (HOSPITAL_COMMUNITY): Payer: Self-pay | Admitting: Urology

## 2022-05-14 LAB — BASIC METABOLIC PANEL
Anion gap: 8 (ref 5–15)
BUN: 29 mg/dL — ABNORMAL HIGH (ref 8–23)
CO2: 24 mmol/L (ref 22–32)
Calcium: 9.1 mg/dL (ref 8.9–10.3)
Chloride: 102 mmol/L (ref 98–111)
Creatinine, Ser: 1.47 mg/dL — ABNORMAL HIGH (ref 0.61–1.24)
GFR, Estimated: 53 mL/min — ABNORMAL LOW (ref >60)
Glucose, Bld: 182 mg/dL — ABNORMAL HIGH (ref 70–99)
Potassium: 5.5 mmol/L — ABNORMAL HIGH (ref 3.5–5.1)
Sodium: 134 mmol/L — ABNORMAL LOW (ref 135–145)

## 2022-05-14 LAB — GLUCOSE, CAPILLARY
Glucose-Capillary: 169 mg/dL — ABNORMAL HIGH (ref 70–99)
Glucose-Capillary: 192 mg/dL — ABNORMAL HIGH (ref 70–99)

## 2022-05-14 LAB — HEMOGLOBIN AND HEMATOCRIT, BLOOD
HCT: 53.6 % — ABNORMAL HIGH (ref 39.0–52.0)
Hemoglobin: 16.5 g/dL (ref 13.0–17.0)

## 2022-05-14 MED ORDER — OXYCODONE HCL 5 MG PO TABS: 10.0000 mg | ORAL_TABLET | ORAL | Status: DC | PRN

## 2022-05-14 MED ORDER — SENNA 8.6 MG PO TABS: 1.0000 | ORAL_TABLET | Freq: Every day | ORAL | 0 refills | Status: AC

## 2022-05-14 MED ORDER — ACETAMINOPHEN 10 MG/ML IV SOLN: 1000.0000 mg | Freq: Four times a day (QID) | INTRAVENOUS | Status: DC

## 2022-05-14 MED ORDER — ACETAMINOPHEN 10 MG/ML IV SOLN
1000.0000 mg | Freq: Four times a day (QID) | INTRAVENOUS | Status: DC
  Filled 2022-05-14: qty 100

## 2022-05-14 MED ORDER — OXYCODONE HCL 10 MG PO TABS: 10.0000 mg | ORAL_TABLET | ORAL | 0 refills | Status: DC | PRN

## 2022-05-14 NOTE — Progress Notes (Signed)
Discharge paperwork reviewed. IV removed. Pt stable. Per NT, pt got into car with family member and was not driving.

## 2022-05-14 NOTE — Clinical Social Work Note (Signed)
Transition of Care [facility]) Screening Note   Patient Details  Name: Ryan Farmer Date of Birth: 03-20-1956   Transition of Care [facility]) CM/SW Contact:    Ross Ludwig, LCSW Phone Number: 05/14/2022, 12:45 PM    Transition of Care Department [facility]) has reviewed patient and no TOC needs have been identified at this time. We will continue to monitor patient advancement through interdisciplinary progression rounds. If new patient transition needs arise, please place a TOC consult.

## 2022-05-14 NOTE — Discharge Summary (Signed)
Date of admission: 05/13/2022  Date of discharge: 05/14/2022  Admission diagnosis: Left renal mass  Discharge diagnosis: Same  Secondary diagnoses: None  History and Physical: For full details, please see admission history and physical. Briefly, Ryan Farmer is a 66 y.o. year old patient with a PMH of left renal mass. He presented to [facility] for scheduled robotic left radical nephrectomy with retroperitoneal lymph node dissection.   Hospital Course: The patient underwent robotic-assisted left radical nephrectomy with retroperitoneal lymph node dissection on 05/13/22. The patient tolerated the procedure well. Details of the procedure can be seen in a separately dictated Operative Note. The patient was subsequently transferred to the floor. He was kept on a CLD with mIVF and PRN analgesia. His foley was removed on POD#0 and he passed a TOV. By the morning of POD#1, patient was tolerating a regular diet and he was medlocked. He continued to void without difficulty. He reported some abdominal discomfort and gas pains. His PRN oxycodone dose was increased from '5mg'$  to '10mg'$  given history of opiate use for chronic back pain. He was also started on IV tylenol. He thereafter reported significant improvement in his pain. He was ambulating without difficulty.  By the afternoon of POD#1, the patient was deemed appropriate for discharge. He was tolerating a regular diet, pain was well-controlled, and he was ambulating without difficulty.  Laboratory values:  Recent Labs    05/13/22 1023 05/14/22 0625  HGB 16.3 16.5  HCT 52.6* 53.6*   Recent Labs    05/14/22 0625  CREATININE 1.47*    Disposition: Home  Discharge instruction: The patient was instructed to be ambulatory but told to refrain from heavy lifting, strenuous activity, or driving. Call for fevers, nausea/vomiting, refractory pain, or any signs/symptoms of systemic infection.  Discharge medications:  Allergies as of 05/14/2022   No Known  Allergies      Medication List     STOP taking these medications    oxyCODONE-acetaminophen 5-325 MG tablet Commonly known as: PERCOCET/ROXICET       TAKE these medications    Accu-Chek Guide test strip Generic drug: glucose blood USE TWICE DAILY AS DIRECTED TO TEST BLOOD SUGAR   accu-chek soft touch lancets Use as instructed to test blood sugar 3 times daily DX E11.42   acetaminophen 650 MG CR tablet Commonly known as: TYLENOL Take 2-3 tablets by mouth daily as needed for pain.   atorvastatin 80 MG tablet Commonly known as: LIPITOR Take 1 tablet (80 mg total) by mouth daily.   atorvastatin 40 MG tablet Commonly known as: LIPITOR Take 40 mg by mouth daily.   baclofen 10 MG tablet Commonly known as: LIORESAL TAKE 1 TABLET(10 MG) BY MOUTH TWICE DAILY   BD Pen Needle Nano 2nd Gen 32G X 4 MM Misc Generic drug: Insulin Pen Needle Inject 1 Device into the skin in the morning and at bedtime.   cetirizine 10 MG tablet Commonly known as: ZYRTEC Take 1 tablet (10 mg total) by mouth daily.   dapagliflozin propanediol 10 MG Tabs tablet Commonly known as: Farxiga Take 1 tablet (10 mg total) by mouth daily before breakfast.   diclofenac 50 MG EC tablet Commonly known as: VOLTAREN TAKE 1 TABLET(50 MG) BY MOUTH TWICE DAILY as needed with food What changed:  how much to take how to take this when to take this reasons to take this   docusate sodium 100 MG capsule Commonly known as: COLACE TAKE 1 CAPSULE(100 MG) BY MOUTH TWICE DAILY   doxycycline  100 MG capsule Commonly known as: VIBRAMYCIN Take 1 capsule (100 mg total) by mouth 2 (two) times daily.   gabapentin 300 MG capsule Commonly known as: NEURONTIN Take 1 capsule (300 mg total) by mouth 2 (two) times daily AND 2 capsules (600 mg total) at bedtime.   Insulin Lispro Prot & Lispro (75-25) 100 UNIT/ML Kwikpen Commonly known as: HumaLOG Mix 75/25 KwikPen Inject 30 Units into the skin in the morning and at  bedtime.   levocetirizine 5 MG tablet Commonly known as: XYZAL Take 1 tablet (5 mg total) by mouth every evening.   metFORMIN 1000 MG tablet Commonly known as: GLUCOPHAGE Take 1 tablet (1,000 mg total) by mouth 2 (two) times daily with a meal.   omega-3 acid ethyl esters 1 g capsule Commonly known as: Lovaza Take 1 capsule (1 g total) by mouth 2 (two) times daily.   Oxycodone HCl 10 MG Tabs Take 1 tablet (10 mg total) by mouth every 4 (four) hours as needed for moderate pain.   senna 8.6 MG Tabs tablet Commonly known as: SENOKOT Take 1 tablet (8.6 mg total) by mouth daily.        Followup:   Follow-up Information     Alexis Frock, MD Follow up on 05/30/2022.   Specialty: Urology Why: at 9:45 for MD visit and pathology review. Contact information: [place] [address] [phone]

## 2022-05-16 LAB — SURGICAL PATHOLOGY

## 2022-05-31 ENCOUNTER — Other Ambulatory Visit: Payer: Self-pay | Admitting: Radiology

## 2022-05-31 MED ORDER — DICLOFENAC SODIUM 50 MG PO TBEC: DELAYED_RELEASE_TABLET | ORAL | 0 refills | Status: DC

## 2022-06-25 ENCOUNTER — Other Ambulatory Visit: Payer: Self-pay | Admitting: Orthopaedic Surgery

## 2022-07-01 ENCOUNTER — Other Ambulatory Visit: Payer: Self-pay | Admitting: Internal Medicine

## 2022-07-25 ENCOUNTER — Other Ambulatory Visit: Payer: Self-pay | Admitting: Orthopaedic Surgery

## 2022-08-01 ENCOUNTER — Encounter (HOSPITAL_COMMUNITY): Payer: Self-pay | Admitting: Emergency Medicine

## 2022-08-01 ENCOUNTER — Ambulatory Visit (HOSPITAL_COMMUNITY)
Admission: EM | Admit: 2022-08-01 | Discharge: 2022-08-01 | Disposition: A | Discharge status: Home / Self Care | Payer: 59 | Attending: Emergency Medicine | Admitting: Emergency Medicine

## 2022-08-01 DIAGNOSIS — R051 Acute cough: Secondary | ICD-10-CM | POA: Diagnosis not present

## 2022-08-01 DIAGNOSIS — K047 Periapical abscess without sinus: Secondary | ICD-10-CM

## 2022-08-01 DIAGNOSIS — R0981 Nasal congestion: Secondary | ICD-10-CM

## 2022-08-01 DIAGNOSIS — R195 Other fecal abnormalities: Secondary | ICD-10-CM

## 2022-08-01 DIAGNOSIS — K0889 Other specified disorders of teeth and supporting structures: Secondary | ICD-10-CM | POA: Diagnosis not present

## 2022-08-01 MED ORDER — AMOXICILLIN-POT CLAVULANATE 875-125 MG PO TABS: 1.0000 | ORAL_TABLET | Freq: Two times a day (BID) | ORAL | 0 refills | Status: DC

## 2022-08-01 MED ORDER — LOPERAMIDE HCL 2 MG PO CAPS: 2.0000 mg | ORAL_CAPSULE | Freq: Four times a day (QID) | ORAL | 0 refills | Status: DC | PRN

## 2022-08-01 NOTE — ED Provider Notes (Addendum)
[place]    CSN: [id number] Arrival date & time: 08/01/22  0940      History   Chief Complaint Chief Complaint  Patient presents with   Cough   Nasal Congestion   Dental Pain    HPI Ryan Farmer is a 67 y.o. male.  Patient presents complaining of nonproductive cough and nasal congestion for 2 weeks.  Patient denies any known exposure to any viral illness upon onset.  Patient denies any fever.  Patient reports having episodes of chills.  Patient reports he feels as though onset of symptoms began when he was outside and "experienced the cold wind".  Patient states he has not taken any medications for symptoms.   Patient presents complaining of dental pain of the left upper side of his mouth for the past 3 months.  Patient reports that he has had swelling in the affected area.  He reports increased pain when attempting to eat certain foods.  He reports that he has seen a dentist previously for some ongoing dental problems.  Patient reports that he has had to receive antibiotics in the past which helped for a period of time.  He states that he has not followed up with a dentist in the past 2 to 3 months.  He reports that his dentist has want to send him to a specialist but he has not followed up with a dentist on this.   Patient reports loose stools for the past 2 to 3 months.  Patient reports that he is having difficulty having more formed stools even with increasing his fiber.  He denies any abdominal pain.  He denies any urinary symptoms. He reports that he does continue to take metformin and insulin for his diabetes.  He reports that he does check his blood sugars at home and they remain in the 140s. He reports a history of an abdominal hernia.   He reports that he has followed up with pain management for chronic back pain.  He reports that he is displeased with the provider that he usually sees for chronic pain.  He is requesting information for a different office.   He  reports that he has not establish care with a PCP, he states that his oncology provider and pain management provider have been trying blood work for his chronic disease management.  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  He denies any current chemotherapy.    Cough Associated symptoms: chills and rhinorrhea   Associated symptoms: no chest pain, no ear pain, no fever, no shortness of breath, no sore throat and no wheezing   Dental Pain Associated symptoms: congestion and facial swelling (LFT sided)   Associated symptoms: no fever     Past Medical History:  Diagnosis Date   Arthritis    All over   CAD (coronary artery disease), native coronary artery, minimal by cardiac cath 01/08/19 01/09/2019   Chronic pain syndrome    Diabetes mellitus    History of cocaine use    History of kidney stones    Hypercholesteremia    Hypertension    Low back pain    Lumbar radiculopathy    Right leg pain    Seborrheic dermatitis of scalp    Shingles     Patient Active Problem List   Diagnosis Date Noted   Renal mass 05/13/2022   Spinal stenosis of lumbosacral region  04/21/2021   Spondylolisthesis, lumbar region    Palmar wart 04/05/2021   Acute non-recurrent maxillary sinusitis 04/05/2021   Type 2 diabetes mellitus with hyperglycemia, with long-term current use of insulin ([place]) 08/20/2020   Type 2 diabetes mellitus with microalbuminuria, with long-term current use of insulin ([place]) 10/14/2019   Type 2 diabetes mellitus with diabetic polyneuropathy, with long-term current use of insulin ([place]) 04/15/2019   Hypertriglyceridemia 04/15/2019   Spinal stenosis of lumbar region with neurogenic claudication 03/07/2019   Lumbar radiculopathy 03/07/2019   Chronic bilateral low back pain with bilateral sciatica 03/07/2019   CAD (coronary artery disease), native coronary artery, minimal by cardiac cath  01/08/19 01/09/2019   Syncope and collapse    Spondylosis without myelopathy or radiculopathy, lumbar region 04/26/2018   Cocaine abuse ([place]) 12/19/2016   Shingles outbreak 12/13/2016   Diabetes type 2, uncontrolled 04/19/2016   Metatarsalgia 10/19/2015   Back muscle spasm 11/18/2013   Hyperlipidemia 11/18/2013   HTN (hypertension) 11/22/2012   Chronic pain syndrome 11/22/2012   Tobacco use disorder 11/22/2012   Dental caries 11/22/2012    Past Surgical History:  Procedure Laterality Date   CIRCUMCISION N/A 03/16/2018   Procedure: CIRCUMCISION ADULT WITH PENILE BLOCK;  Surgeon: Alexis Frock, MD;  Location: [facility];  Service: Urology;  Laterality: N/A;  28 MINS   KNEE ARTHROSCOPY Bilateral    LEFT HEART CATH AND CORONARY ANGIOGRAPHY N/A 01/08/2019   Procedure: LEFT HEART CATH AND CORONARY ANGIOGRAPHY;  Surgeon: Troy Sine, MD;  Location: [place] [facility];  Service: Cardiovascular;  Laterality: N/A;   LYMPH NODE DISSECTION Left 05/13/2022   Procedure: RETROPERITONEAL LYMPH NODE [place] NODE DISSECTION;  Surgeon: Alexis Frock, MD;  Location: [facility];  Service: Urology;  Laterality: Left;   ROBOT ASSISTED LAPAROSCOPIC NEPHRECTOMY Left 05/13/2022   Procedure: XI ROBOTIC ASSISTED LAPAROSCOPIC NEPHRECTOMY;  Surgeon: Alexis Frock, MD;  Location: [facility];  Service: Urology;  Laterality: Left;  3 HRS       Home Medications    Prior to Admission medications   Medication Sig Start Date End Date Taking? Authorizing Provider  amoxicillin-clavulanate (AUGMENTIN) 875-125 MG tablet Take 1 tablet by mouth every 12 (twelve) hours. 08/01/22  Yes Flossie Dibble, NP  loperamide (IMODIUM) 2 MG capsule Take 1 capsule (2 mg total) by mouth 4 (four) times daily as needed for diarrhea or loose stools. 08/01/22  Yes Flossie Dibble, NP  acetaminophen (TYLENOL) 650 MG CR tablet Take 2-3 tablets by mouth daily as needed for pain.    [provider]   atorvastatin (LIPITOR) 40 MG tablet Take 40 mg by mouth daily. 02/24/22   [provider]  dapagliflozin propanediol (FARXIGA) 10 MG TABS tablet Take 1 tablet (10 mg total) by mouth daily before breakfast. 01/13/22   Shamleffer, Melanie Crazier, MD  diclofenac (VOLTAREN) 50 MG EC tablet TAKE 1 TABLET(50 MG) BY MOUTH TWICE DAILY WITH FOOD AS NEEDED 07/25/22   Marybelle Killings, MD  glucose blood (ACCU-CHEK GUIDE) test strip USE THREE TIMES DAILY AS DIRECTED 07/06/22   Shamleffer, Melanie Crazier, MD  Insulin Lispro Prot & Lispro (HUMALOG MIX 75/25 KWIKPEN) (75-25) 100 UNIT/ML Kwikpen Inject 30 Units into the skin in the morning and at bedtime. 01/13/22   Shamleffer, Melanie Crazier, MD  Insulin Pen Needle (BD PEN NEEDLE NANO 2ND GEN) 32G X 4 MM MISC Inject 1 Device into the skin in the morning and at bedtime. 01/13/22   Shamleffer, Melanie Crazier, MD  metFORMIN (GLUCOPHAGE)  1000 MG tablet Take 1 tablet (1,000 mg total) by mouth 2 (two) times daily with a meal. 01/13/22   Shamleffer, Melanie Crazier, MD    Family History Family History  Problem Relation Age of Onset   Cancer Mother    Stroke Father    Heart disease Father    Diabetes Father     Social History Social History   Tobacco Use   Smoking status: Every Day    Packs/day: 1.00    Years: 50.00    Total pack years: 50.00    Types: Cigarettes   Smokeless tobacco: Former    Types: Nurse, children's Use: Never used  Substance Use Topics   Alcohol use: Not Currently    Comment: occ   Drug use: Not Currently    Types: Marijuana, Cocaine    Comment: twice a week     Allergies   Patient has no known allergies.   Review of Systems Review of Systems  Constitutional:  Positive for chills. Negative for activity change, appetite change, fatigue, fever and unexpected weight change.  HENT:  Positive for congestion, dental problem, facial swelling (LFT sided), rhinorrhea, sinus pressure and sneezing. Negative for ear  discharge, ear pain, postnasal drip, sore throat, trouble swallowing and voice change.   Eyes: Negative.   Respiratory:  Positive for cough. Negative for chest tightness, shortness of breath, wheezing and stridor.   Cardiovascular:  Negative for chest pain and palpitations.  Gastrointestinal:  Positive for diarrhea. Negative for abdominal distention, abdominal pain, blood in stool, constipation, nausea, rectal pain and vomiting.  Endocrine: Negative.   Genitourinary:  Negative for decreased urine volume, difficulty urinating, dysuria, flank pain, frequency, hematuria, penile discharge, penile pain, penile swelling and urgency.     Physical Exam Triage Vital Signs ED Triage Vitals  Enc Vitals Group     BP 08/01/22 1049 133/84     Pulse Rate 08/01/22 1049 76     Resp 08/01/22 1049 17     Temp 08/01/22 1049 97.8 F (36.6 C)     Temp Source 08/01/22 1049 Oral     SpO2 08/01/22 1049 94 %     Weight --      Height --      Head Circumference --      Peak Flow --      Pain Score 08/01/22 1047 7     Pain Loc --      Pain Edu? --      Excl. in [place]? --    No data found.  Updated Vital Signs BP 133/84 (BP Location: Right Arm)   Pulse 76   Temp 97.8 F (36.6 C) (Oral)   Resp 17   SpO2 94%     Physical Exam Vitals and nursing note reviewed.  Constitutional:      Appearance: Normal appearance.  HENT:     Right Ear: Hearing, tympanic membrane, ear canal and external ear normal.     Left Ear: Hearing, tympanic membrane, ear canal and external ear normal.     Nose: Congestion and rhinorrhea present. Rhinorrhea is clear.     Right Turbinates: Enlarged and swollen. Not pale.     Left Turbinates: Enlarged and swollen. Not pale.     Right Sinus: No maxillary sinus tenderness or frontal sinus tenderness.     Left Sinus: No maxillary sinus tenderness or frontal sinus tenderness.     Mouth/Throat:     Mouth: Mucous membranes are moist.  Dentition: Abnormal dentition. Dental tenderness  (LFT upper row), gingival swelling (LFT upper row) and dental caries (Multiple dental caries noted within mouth) present. No dental abscesses.     Pharynx: Oropharynx is clear. Uvula midline. No pharyngeal swelling, oropharyngeal exudate, posterior oropharyngeal erythema or uvula swelling.     Tonsils: No tonsillar exudate or tonsillar abscesses. 0 on the right. 0 on the left.      Comments: Tenderness upon palpation of LFT upper area above lip, located on diagram, minimal swelling noted.   Cardiovascular:     Rate and Rhythm: Normal rate and regular rhythm.     Heart sounds: Normal heart sounds, S1 normal and S2 normal.  Pulmonary:     Effort: Pulmonary effort is normal.     Breath sounds: Normal breath sounds and air entry. No decreased breath sounds, wheezing, rhonchi or rales.  Abdominal:     General: Abdomen is flat. Bowel sounds are normal. There is no distension. There are no signs of injury.     Palpations: Abdomen is soft. There is no shifting dullness, fluid wave, hepatomegaly, splenomegaly, mass or pulsatile mass.     Tenderness: There is generalized abdominal tenderness. There is no right CVA tenderness, left CVA tenderness, guarding or rebound. Negative signs include Murphy's sign and McBurney's sign.  Neurological:     Mental Status: He is alert.      UC Treatments / Results  Labs (all labs ordered are listed, but only abnormal results are displayed) Labs Reviewed - No data to display  EKG   Radiology No results found.  Procedures Procedures (including critical care time)  Medications Ordered in UC Medications - No data to display  Initial Impression / Assessment and Plan / UC Course  I have reviewed the triage vital signs and the nursing notes.  Pertinent labs & imaging results that were available during my care of the patient were reviewed by me and considered in my medical decision making (see chart for details).     Patient was evaluated for dental pain  and infection.  Augmentin was prescribed, patient was made aware of treatment regiment.  No NSAID was prescribed, due to previous BMP lab results in epic, GFR 53 and creatinine 1.47, patient was made aware he can continue with Tylenol for pain management.  Patient made aware that a narcotic did not seem warranted in this case, especially with patient's continued follow-up with pain management.  Patient was made aware that he will need to call and schedule follow-up with his dentist.  Patient was evaluated for nasal congestion and acute cough.  Initial etiology appears to be related to a viral illness.  Possible secondary bacterial etiology, Augmentin antimicrobial coverage will provide treatment for possible secondary bacterial etiology.  Patient was made aware of treatment regiment.  Patient was evaluated for loose stools.  Patient was educated on side effects of metformin, he was made aware that this medication may continue to cause loose stools. Patient was offered lab work to verify no bacterial etiology, electrolytes, kidney function, and liver function, he declined any blood work, he stated that he wants to follow-up with his pain management and oncology provider for blood work.  Imodium was prescribed for periodic symptom management. He was made aware of increasing fluid and fiber intake. Patient was made aware of treatment regiment.  Patient was given information to follow-up with a PCP, information was attached to his discharge paperwork.  Patient also requested information for pain management, this information was provided  on his discharge paperwork.  Patient was made aware of red flag symptoms that would warrant an emergency department visit, patient verbalized understanding of instructions.  Charting was provided using a a verbal dictation system, charting was proofread for errors, errors may occur which could change the meaning of the information charted.     Final Clinical Impressions(s) /  UC Diagnoses   Final diagnoses:  Pain, dental  Dental infection  Nasal congestion  Acute cough  Loose stools     Discharge Instructions      Augmentin has been sent to the pharmacy, you will take this 2 times a day for the next 7 days.  Please make sure to finish all of the antibiotics even if symptoms improve before completion.  Please avoid taking this medication on an empty stomach.   Please call and schedule follow-up with the dentist.   Imodium has been sent to the pharmacy, this can help with any loose stools, please do not take more than 4 tablets in a 24-hour period.  Also continue to increase your fiber intake.  Please ensure to drink plenty of water, drinking at least 8 cups of water daily.  I suggest that you schedule follow-up with a primary care doctor, I have attached information to your paperwork, please call and schedule an appointment.  As discussed, the metformin can have a side effect of causing loose stools and this is something that can be addressed with a primary care doctor.  If you develop any severe/worsening symptoms at home please go to the nearest emergency department.   I have attached information for pain management to your discharge paperwork.     ED Prescriptions     Medication Sig Dispense Auth. Provider   amoxicillin-clavulanate (AUGMENTIN) 875-125 MG tablet Take 1 tablet by mouth every 12 (twelve) hours. 14 tablet Flossie Dibble, NP   loperamide (IMODIUM) 2 MG capsule Take 1 capsule (2 mg total) by mouth 4 (four) times daily as needed for diarrhea or loose stools. 12 capsule Flossie Dibble, NP      PDMP not reviewed this encounter.   Flossie Dibble, NP 08/01/22 1200    Flossie Dibble, NP 08/01/22 1204

## 2022-08-01 NOTE — ED Triage Notes (Signed)
Pt c/o cough and congestion for about 2 weeks.  Dental pain for 3 months  For about 2-3 months had yellow, mush for stools and requesting medication to help get them back to normal.

## 2022-08-01 NOTE — Discharge Instructions (Addendum)
Augmentin has been sent to the pharmacy, you will take this 2 times a day for the next 7 days.  Please make sure to finish all of the antibiotics even if symptoms improve before completion.  Please avoid taking this medication on an empty stomach.   Please call and schedule follow-up with the dentist.   Imodium has been sent to the pharmacy, this can help with any loose stools, please do not take more than 4 tablets in a 24-hour period.  Also continue to increase your fiber intake.  Please ensure to drink plenty of water, drinking at least 8 cups of water daily.  I suggest that you schedule follow-up with a primary care doctor, I have attached information to your paperwork, please call and schedule an appointment.  As discussed, the metformin can have a side effect of causing loose stools and this is something that can be addressed with a primary care doctor.  If you develop any severe/worsening symptoms at home please go to the nearest emergency department.   I have attached information for pain management to your discharge paperwork.

## 2022-08-24 ENCOUNTER — Other Ambulatory Visit: Payer: Self-pay | Admitting: Orthopaedic Surgery

## 2023-02-01 ENCOUNTER — Other Ambulatory Visit (INDEPENDENT_AMBULATORY_CARE_PROVIDER_SITE_OTHER): Payer: 59

## 2023-02-01 ENCOUNTER — Ambulatory Visit: Payer: 59 | Admitting: Orthopedic Surgery

## 2023-02-01 ENCOUNTER — Encounter: Payer: Self-pay | Admitting: Orthopedic Surgery

## 2023-02-01 VITALS — BP 132/77 | HR 69 | Ht 72.0 in | Wt 220.0 lb

## 2023-02-01 DIAGNOSIS — Z981 Arthrodesis status: Secondary | ICD-10-CM | POA: Diagnosis not present

## 2023-02-01 DIAGNOSIS — M4726 Other spondylosis with radiculopathy, lumbar region: Secondary | ICD-10-CM | POA: Diagnosis not present
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
## 2023-02-03 ENCOUNTER — Ambulatory Visit: Payer: 59 | Admitting: Orthopedic Surgery

## 2023-02-13 ENCOUNTER — Ambulatory Visit
Admission: RE | Admit: 2023-02-13 | Discharge: 2023-02-13 | Disposition: A | Discharge status: Home / Self Care | Payer: 59 | Source: Ambulatory Visit | Attending: Orthopedic Surgery | Admitting: Orthopedic Surgery

## 2023-02-13 DIAGNOSIS — M4726 Other spondylosis with radiculopathy, lumbar region: Secondary | ICD-10-CM

## 2023-03-06 ENCOUNTER — Ambulatory Visit (HOSPITAL_COMMUNITY)
Admission: EM | Admit: 2023-03-06 | Discharge: 2023-03-06 | Disposition: A | Discharge status: Home / Self Care | Payer: 59 | Attending: Family | Admitting: Family

## 2023-03-06 ENCOUNTER — Encounter (HOSPITAL_COMMUNITY): Payer: Self-pay

## 2023-03-06 DIAGNOSIS — R062 Wheezing: Secondary | ICD-10-CM | POA: Diagnosis not present

## 2023-03-06 DIAGNOSIS — H6592 Unspecified nonsuppurative otitis media, left ear: Secondary | ICD-10-CM

## 2023-03-06 DIAGNOSIS — J0110 Acute frontal sinusitis, unspecified: Secondary | ICD-10-CM | POA: Diagnosis not present

## 2023-03-06 DIAGNOSIS — R051 Acute cough: Secondary | ICD-10-CM | POA: Diagnosis not present

## 2023-03-06 MED ORDER — DOXYCYCLINE HYCLATE 100 MG PO CAPS: 100.0000 mg | ORAL_CAPSULE | Freq: Two times a day (BID) | ORAL | 0 refills | Status: AC

## 2023-03-06 MED ORDER — ALBUTEROL SULFATE HFA 108 (90 BASE) MCG/ACT IN AERS: 2.0000 | INHALATION_SPRAY | Freq: Four times a day (QID) | RESPIRATORY_TRACT | 0 refills | Status: DC | PRN

## 2023-03-06 MED ORDER — PREDNISONE 20 MG PO TABS: 40.0000 mg | ORAL_TABLET | Freq: Every day | ORAL | 0 refills | Status: AC

## 2023-03-06 NOTE — Discharge Instructions (Signed)
Recommend start Doxycycline 100mg  twice a day for 7 days- your insurance will not cover the once daily Levaquin antibiotic that I would prefer to prescribe but Doxycycline should take care of your sinus infection. Start Prednisone 40mg  daily for 5 days- take with food. May use Albuterol inhaler 2 puffs every 6 hours as needed for wheezing or cough. Increase fluids to help loosen up mucus in sinuses and lungs. Also recommend OTC Delsym cough syrup 2 teaspoons every 12 hours as needed for cough. Follow-up with your PCP in 4 to 5 days if not improving.

## 2023-03-06 NOTE — ED Triage Notes (Signed)
Pt presents to UC w/ c/o nasal congestion, left ear pain, productive coughing x2 weeks. Pt said he "got caught in the rain" about a month ago and got water in his ear.

## 2023-03-06 NOTE — ED Provider Notes (Signed)
[facility]    CSN: [id number] Arrival date & time: 03/06/23  4098      History   Chief Complaint No chief complaint on file.   HPI Ryan Farmer is a 67 y.o. male.   67 year old male presents with left ear pain that started over 2 weeks ago. Thought he got "water in my ear" from bad rainstorm about 1 month ago. Has history of recurrent ear infections. At one point, was considering ear tubes but not recently. Both ears have been "plugged" and have been "popping". Also in the past 2 weeks have developed more nasal congestion, sinus pressure and headaches, chills, coughing with discolored mucus. Some nausea but no vomiting. Has not taken any medication for symptoms. Has dental issues and was placed on Augmentin a few months ago. Other chronic health issues include type 2 DM in which he takes Dapaglifozin, Metformin and Insulin Humalog. Also has HTN and hyperlipidemia- currently taking Lipitor. Smokes tobacco daily. Also has chronic back pain and arthritis with neuropathy- takes Tylenol daily with minimal control.   The history is provided by the patient.    Past Medical History:  Diagnosis Date   Arthritis    All over   CAD (coronary artery disease), native coronary artery, minimal by cardiac cath 01/08/19 01/09/2019   Chronic pain syndrome    Diabetes mellitus    History of cocaine use    History of kidney stones    Hypercholesteremia    Hypertension    Low back pain    Lumbar radiculopathy    Right leg pain    Seborrheic dermatitis of scalp    Shingles     Patient Active Problem List   Diagnosis Date Noted   Renal mass 05/13/2022   Spinal stenosis of lumbosacral region 04/21/2021   Spondylolisthesis, lumbar region    Palmar wart 04/05/2021   Acute non-recurrent maxillary sinusitis 04/05/2021   Type 2 diabetes mellitus with hyperglycemia, with long-term current use of insulin (HCC) 08/20/2020   Type 2 diabetes mellitus with microalbuminuria, with long-term  current use of insulin (HCC) 10/14/2019   Type 2 diabetes mellitus with diabetic polyneuropathy, with long-term current use of insulin (HCC) 04/15/2019   Hypertriglyceridemia 04/15/2019   Spinal stenosis of lumbar region with neurogenic claudication 03/07/2019   Lumbar radiculopathy 03/07/2019   Chronic bilateral low back pain with bilateral sciatica 03/07/2019   CAD (coronary artery disease), native coronary artery, minimal by cardiac cath 01/08/19 01/09/2019   Syncope and collapse    Spondylosis without myelopathy or radiculopathy, lumbar region 04/26/2018   Cocaine abuse (HCC) 12/19/2016   Shingles outbreak 12/13/2016   Diabetes type 2, uncontrolled 04/19/2016   Metatarsalgia 10/19/2015   Back muscle spasm 11/18/2013   Hyperlipidemia 11/18/2013   HTN (hypertension) 11/22/2012   Chronic pain syndrome 11/22/2012   Tobacco use disorder 11/22/2012   Dental caries 11/22/2012    Past Surgical History:  Procedure Laterality Date   CIRCUMCISION N/A 03/16/2018   Procedure: CIRCUMCISION ADULT WITH PENILE BLOCK;  Surgeon: Sebastian Ache, MD;  [facility]  Service: Urology;  Laterality: N/A;  45 MINS   KNEE ARTHROSCOPY Bilateral    LEFT HEART CATH AND CORONARY ANGIOGRAPHY N/A 01/08/2019   Procedure: LEFT HEART CATH AND CORONARY ANGIOGRAPHY;  Surgeon: Lennette Bihari, MD;  [facility];  Service: Cardiovascular;  Laterality: N/A;   LYMPH NODE DISSECTION Left 05/13/2022   Procedure: RETROPERITONEAL LYMPH NODE DISSECTIONLYMPH NODE DISSECTION;  Surgeon: Berneice Heinrich,  Normand Sloop, MD;  Location: [facility];  Service: Urology;  Laterality: Left;   ROBOT ASSISTED LAPAROSCOPIC NEPHRECTOMY Left 05/13/2022   Procedure: XI ROBOTIC ASSISTED LAPAROSCOPIC NEPHRECTOMY;  Surgeon: Sebastian Ache, MD;  Location: [facility];  Service: Urology;  Laterality: Left;  3 HRS       Home Medications    Prior to Admission medications   Medication Sig Start Date End Date Taking? Authorizing  Provider  albuterol (VENTOLIN HFA) 108 (90 Base) MCG/ACT inhaler Inhale 2 puffs into the lungs every 6 (six) hours as needed for wheezing or shortness of breath. 03/06/23  Yes Abdou Stocks, Ali Lowe, NP  doxycycline (VIBRAMYCIN) 100 MG capsule Take 1 capsule (100 mg total) by mouth 2 (two) times daily for 7 days. 03/06/23 03/13/23 Yes Andriel Omalley, Ali Lowe, NP  predniSONE (DELTASONE) 20 MG tablet Take 2 tablets (40 mg total) by mouth daily for 5 days. 03/06/23 03/11/23 Yes Markies Mowatt, Ali Lowe, NP  acetaminophen (TYLENOL) 650 MG CR tablet Take 2-3 tablets by mouth daily as needed for pain.    [provider]  atorvastatin (LIPITOR) 40 MG tablet Take 40 mg by mouth daily. 02/24/22   [provider]  dapagliflozin propanediol (FARXIGA) 10 MG TABS tablet Take 1 tablet (10 mg total) by mouth daily before breakfast. 01/13/22   Shamleffer, Konrad Dolores, MD  diclofenac (VOLTAREN) 50 MG EC tablet TAKE 1 TABLET(50 MG) BY MOUTH TWICE DAILY WITH FOOD AS NEEDED 07/25/22   Eldred Manges, MD  glucose blood (ACCU-CHEK GUIDE) test strip USE THREE TIMES DAILY AS DIRECTED 07/06/22   Shamleffer, Konrad Dolores, MD  Insulin Lispro Prot & Lispro (HUMALOG MIX 75/25 KWIKPEN) (75-25) 100 UNIT/ML Kwikpen Inject 30 Units into the skin in the morning and at bedtime. 01/13/22   Shamleffer, Konrad Dolores, MD  Insulin Pen Needle (BD PEN NEEDLE NANO 2ND GEN) 32G X 4 MM MISC Inject 1 Device into the skin in the morning and at bedtime. 01/13/22   Shamleffer, Konrad Dolores, MD  metFORMIN (GLUCOPHAGE) 1000 MG tablet Take 1 tablet (1,000 mg total) by mouth 2 (two) times daily with a meal. 01/13/22   Shamleffer, Konrad Dolores, MD    Family History Family History  Problem Relation Age of Onset   Cancer Mother    Stroke Father    Heart disease Father    Diabetes Father     Social History Social History   Tobacco Use   Smoking status: Every Day    Current packs/day: 1.00    Average packs/day: 1 pack/day for 50.0 years (50.0 ttl  pk-yrs)    Types: Cigarettes   Smokeless tobacco: Former    Types: Engineer, drilling   Vaping status: Never Used  Substance Use Topics   Alcohol use: Not Currently    Comment: occ   Drug use: Not Currently    Types: Marijuana, Cocaine    Comment: twice a week     Allergies   Patient has no known allergies.   Review of Systems Review of Systems  Constitutional:  Positive for activity change, appetite change, chills and fatigue. Negative for diaphoresis. Fever: possible. HENT:  Positive for congestion, dental problem, ear pain (mainly left), postnasal drip, sinus pressure, sinus pain and sore throat (irritated). Negative for ear discharge, facial swelling, nosebleeds and trouble swallowing.   Eyes:  Negative for discharge and itching.  Respiratory:  Positive for cough, chest tightness and wheezing. Negative for shortness of breath.   Gastrointestinal:  Positive for nausea. Negative for diarrhea and  vomiting.  Musculoskeletal:  Positive for arthralgias, back pain and myalgias. Negative for neck stiffness.  Skin:  Negative for color change and rash.  Allergic/Immunologic: Negative for environmental allergies and food allergies.  Neurological:  Positive for numbness (extremities) and headaches. Negative for dizziness, tremors, seizures, syncope and speech difficulty.  Hematological:  Negative for adenopathy. Does not bruise/bleed easily.     Physical Exam Triage Vital Signs ED Triage Vitals  Encounter Vitals Group     BP 03/06/23 1046 123/73     Systolic BP Percentile --      Diastolic BP Percentile --      Pulse Rate 03/06/23 1046 72     Resp 03/06/23 1046 16     Temp 03/06/23 1046 (!) 97.4 F (36.3 C)     Temp Source 03/06/23 1046 Oral     SpO2 03/06/23 1046 94 %     Weight --      Height --      Head Circumference --      Peak Flow --      Pain Score 03/06/23 1051 5     Pain Loc --      Pain Education --      Exclude from Growth Chart --    No data  found.  Updated Vital Signs BP 123/73 (BP Location: Left Arm)   Pulse 72   Temp (!) 97.4 F (36.3 C) (Oral)   Resp 16   SpO2 94%   Visual Acuity Right Eye Distance:   Left Eye Distance:   Bilateral Distance:    Right Eye Near:   Left Eye Near:    Bilateral Near:     Physical Exam Vitals and nursing note reviewed.  Constitutional:      General: He is awake. He is not in acute distress.    Appearance: He is well-developed. He is ill-appearing.     Comments: He is sitting in the exam chair in no acute distress but appears tired and ill.   HENT:     Head: Normocephalic and atraumatic.     Right Ear: Hearing, ear canal and external ear normal. No drainage, swelling or tenderness. A middle ear effusion is present. There is no impacted cerumen. No foreign body. Tympanic membrane is bulging. Tympanic membrane is not injected, perforated or erythematous.     Left Ear: Hearing, ear canal and external ear normal. Tenderness present. No drainage or swelling. A middle ear effusion is present. There is no impacted cerumen. No foreign body. Tympanic membrane is injected, erythematous and bulging. Tympanic membrane is not perforated.     Nose: Congestion present.     Right Sinus: Frontal sinus tenderness present. No maxillary sinus tenderness.     Left Sinus: Frontal sinus tenderness present. No maxillary sinus tenderness.     Mouth/Throat:     Lips: Pink.     Mouth: Mucous membranes are moist.     Dentition: Dental caries present.     Pharynx: Oropharynx is clear. Uvula midline. Posterior oropharyngeal erythema and postnasal drip present. No pharyngeal swelling, oropharyngeal exudate or uvula swelling.  Eyes:     Extraocular Movements: Extraocular movements intact.     Conjunctiva/sclera: Conjunctivae normal.  Cardiovascular:     Rate and Rhythm: Normal rate and regular rhythm.     Heart sounds: Normal heart sounds. No murmur heard. Pulmonary:     Effort: Pulmonary effort is normal. No  tachypnea, accessory muscle usage or respiratory distress.     Breath sounds:  Normal air entry. No decreased air movement. Examination of the right-upper field reveals wheezing and rhonchi. Examination of the left-upper field reveals wheezing and rhonchi. Examination of the right-middle field reveals wheezing. Examination of the right-lower field reveals wheezing. Examination of the left-lower field reveals wheezing. Wheezing and rhonchi present. No decreased breath sounds or rales.     Comments: Coarse breath sounds and mild rhonchi in upper lobes, mainly with coughing.  Musculoskeletal:     Cervical back: Normal range of motion and neck supple.  Lymphadenopathy:     Cervical: No cervical adenopathy.  Skin:    General: Skin is warm and dry.     Capillary Refill: Capillary refill takes less than 2 seconds.     Findings: No rash.  Neurological:     General: No focal deficit present.     Mental Status: He is alert and oriented to person, place, and time.  Psychiatric:        Attention and Perception: Attention normal.        Mood and Affect: Mood normal.        Speech: Speech normal.        Behavior: Behavior is cooperative.        Thought Content: Thought content normal.      UC Treatments / Results  Labs (all labs ordered are listed, but only abnormal results are displayed) Labs Reviewed - No data to display  EKG   Radiology No results found.  Procedures Procedures (including critical care time)  Medications Ordered in UC Medications - No data to display  Initial Impression / Assessment and Plan / UC Course  I have reviewed the triage vital signs and the nursing notes.  Pertinent labs & imaging results that were available during my care of the patient were reviewed by me and considered in my medical decision making (see chart for details).     Reviewed with patient that he has a left inner ear infection and both ears have some fluid behind the TM. Also has frontal  sinusitis with cough. Since he was recently on Augmentin and current Ear infection, will prescribe Levaquin since he prefers once daily medication. However, his insurance will not cover Levaquin so will start Doxycycline 100mg  twice a day for 7 days. Recommend Prednisone 40mg  daily for 5 days to help with sinus and lung inflammation- can increase blood sugar levels so continue to monitor. May use Albuterol inhaler 2 puffs every 6 hours as needed for wheezing/cough. Increase fluids to help loosen up mucus in sinuses and lungs. Also recommend OTC Delsym 2 teaspoons every 12 hours as needed for cough. However, patient did not want paperwork and left before visit completed and uncertain if he can access MyChart. Recommend follow-up with his PCP in 4 to 5 days if not improving.   Final Clinical Impressions(s) / UC Diagnoses   Final diagnoses:  Left otitis media with effusion  Acute non-recurrent frontal sinusitis  Acute cough  Wheezing     Discharge Instructions      Recommend start Doxycycline 100mg  twice a day for 7 days- your insurance will not cover the once daily Levaquin antibiotic that I would prefer to prescribe but Doxycycline should take care of your sinus infection. Start Prednisone 40mg  daily for 5 days- take with food. May use Albuterol inhaler 2 puffs every 6 hours as needed for wheezing or cough. Increase fluids to help loosen up mucus in sinuses and lungs. Also recommend OTC Delsym cough syrup 2 teaspoons every  12 hours as needed for cough. Follow-up with your PCP in 4 to 5 days if not improving.     ED Prescriptions     Medication Sig Dispense Auth. Provider   doxycycline (VIBRAMYCIN) 100 MG capsule Take 1 capsule (100 mg total) by mouth 2 (two) times daily for 7 days. 14 capsule Sudie Grumbling, NP   predniSONE (DELTASONE) 20 MG tablet Take 2 tablets (40 mg total) by mouth daily for 5 days. 10 tablet Sudie Grumbling, NP   albuterol (VENTOLIN HFA) 108 (90 Base) MCG/ACT inhaler  Inhale 2 puffs into the lungs every 6 (six) hours as needed for wheezing or shortness of breath. 17 g Sudie Grumbling, NP      PDMP not reviewed this encounter.   Sudie Grumbling, NP 03/07/23 1114

## 2023-03-08 ENCOUNTER — Ambulatory Visit: Payer: 59 | Admitting: Orthopedic Surgery

## 2023-03-16 ENCOUNTER — Telehealth: Payer: Self-pay | Admitting: Internal Medicine

## 2023-03-16 ENCOUNTER — Encounter: Payer: Self-pay | Admitting: Internal Medicine

## 2023-03-16 ENCOUNTER — Ambulatory Visit (INDEPENDENT_AMBULATORY_CARE_PROVIDER_SITE_OTHER): Payer: 59 | Admitting: Internal Medicine

## 2023-03-16 VITALS — BP 136/78 | HR 80 | Ht 72.0 in | Wt 213.0 lb

## 2023-03-16 DIAGNOSIS — E1142 Type 2 diabetes mellitus with diabetic polyneuropathy: Secondary | ICD-10-CM

## 2023-03-16 DIAGNOSIS — E1122 Type 2 diabetes mellitus with diabetic chronic kidney disease: Secondary | ICD-10-CM

## 2023-03-16 DIAGNOSIS — E785 Hyperlipidemia, unspecified: Secondary | ICD-10-CM | POA: Insufficient documentation

## 2023-03-16 DIAGNOSIS — E1165 Type 2 diabetes mellitus with hyperglycemia: Secondary | ICD-10-CM

## 2023-03-16 DIAGNOSIS — N1832 Chronic kidney disease, stage 3b: Secondary | ICD-10-CM | POA: Diagnosis not present

## 2023-03-16 DIAGNOSIS — E1129 Type 2 diabetes mellitus with other diabetic kidney complication: Secondary | ICD-10-CM

## 2023-03-16 DIAGNOSIS — Z7984 Long term (current) use of oral hypoglycemic drugs: Secondary | ICD-10-CM

## 2023-03-16 DIAGNOSIS — R809 Proteinuria, unspecified: Secondary | ICD-10-CM

## 2023-03-16 DIAGNOSIS — Z794 Long term (current) use of insulin: Secondary | ICD-10-CM

## 2023-03-16 LAB — BASIC METABOLIC PANEL
BUN: 43 mg/dL — ABNORMAL HIGH (ref 6–23)
CO2: 25 meq/L (ref 19–32)
Calcium: 9.6 mg/dL (ref 8.4–10.5)
Chloride: 97 meq/L (ref 96–112)
Creatinine, Ser: 1.66 mg/dL — ABNORMAL HIGH (ref 0.40–1.50)
GFR: 42.82 mL/min — ABNORMAL LOW (ref >60.00)
Glucose, Bld: 548 mg/dL (ref 70–99)
Potassium: 5.3 meq/L — ABNORMAL HIGH (ref 3.5–5.1)
Sodium: 130 meq/L — ABNORMAL LOW (ref 135–145)

## 2023-03-16 LAB — POCT GLYCOSYLATED HEMOGLOBIN (HGB A1C): Hemoglobin A1C: 10.2 % — AB (ref 4.0–5.6)

## 2023-03-16 LAB — MICROALBUMIN / CREATININE URINE RATIO
Creatinine,U: 27.5 mg/dL
Microalb Creat Ratio: 64.8 mg/g — ABNORMAL HIGH (ref 0.0–30.0)
Microalb, Ur: 17.8 mg/dL — ABNORMAL HIGH (ref 0.0–1.9)

## 2023-03-16 LAB — LIPID PANEL
Cholesterol: 197 mg/dL (ref 0–200)
HDL: 42 mg/dL (ref >39.00)
Total CHOL/HDL Ratio: 5
Triglycerides: 720 mg/dL — ABNORMAL HIGH (ref 0.0–149.0)

## 2023-03-16 LAB — LDL CHOLESTEROL, DIRECT: Direct LDL: 117 mg/dL

## 2023-03-16 LAB — POCT GLUCOSE (DEVICE FOR HOME USE): POC Glucose: 497 mg/dL — AB (ref 70–99)

## 2023-03-16 MED ORDER — LOSARTAN POTASSIUM 25 MG PO TABS: 25.0000 mg | ORAL_TABLET | Freq: Every day | ORAL | 3 refills | Status: DC

## 2023-03-16 MED ORDER — PIOGLITAZONE HCL 15 MG PO TABS: 15.0000 mg | ORAL_TABLET | Freq: Every day | ORAL | 3 refills | Status: DC

## 2023-03-16 MED ORDER — INSULIN LISPRO PROT & LISPRO (75-25 MIX) 100 UNIT/ML KWIKPEN: 30.0000 [IU] | PEN_INJECTOR | Freq: Two times a day (BID) | SUBCUTANEOUS | 3 refills | Status: DC

## 2023-03-16 MED ORDER — ROSUVASTATIN CALCIUM 40 MG PO TABS: 40.0000 mg | ORAL_TABLET | Freq: Every day | ORAL | 3 refills | Status: DC

## 2023-03-16 MED ORDER — BD PEN NEEDLE NANO 2ND GEN 32G X 4 MM MISC: 1.0000 | Freq: Two times a day (BID) | 3 refills | Status: DC

## 2023-03-16 MED ORDER — DAPAGLIFLOZIN PROPANEDIOL 10 MG PO TABS: 10.0000 mg | ORAL_TABLET | Freq: Every day | ORAL | 3 refills | Status: DC

## 2023-03-16 NOTE — Telephone Encounter (Signed)
[facility] is calling with a critical lab result

## 2023-03-16 NOTE — Progress Notes (Signed)
Name: Ryan Farmer  Age/ Sex: 67 y.o., male   MRN/ DOB: [id number], January 16, 1956     PCP: System, Provider Not In   Reason for Endocrinology Evaluation: Type 2 Diabetes Mellitus  Initial Endocrine Consultative Visit: 04/15/2019    PATIENT IDENTIFIER: Ryan Farmer is a 67 y.o. male with a past medical history of CAD, T2DM, Hyperlipidemia and polysubstance abuse, s/p left nephrectomy due to [facility] 05/2022. The patient has followed with Endocrinology clinic since 04/15/2019 for consultative assistance with management of his diabetes.  DIABETIC HISTORY:  Ryan Farmer was diagnosed with DM in 2011. He has been on SU in the past, has been on metformin since diagnosis, insulin started in 2018. His hemoglobin A1c has ranged from 6.4% in 2017, peaking at 12.6% in 2020.  On his initial visit to our clinic he was on Lantus and Metformin with an A1c 10.4% . We stopped lantus and started a novolog Mix.    We attempted MDI regimen in 2022 but the patient did not like that regimen as he felt the insulin works were stronger and he was switched back July 2023  Lives with sister, on disability ( can not read or write)    SUBJECTIVE:   During the last visit (01/13/2022): A1c 9.6%    Today (03/16/2023): Ryan Farmer is here for a follow up on diabetes management.  The patient has not been to our clinic in 14 months.    He checks glucose 3-4 x a day , but did not bring meter    He stopped taking metformin because he read online that it causes cancer  He has not taken his insulin nor [place]  He ate breakfast this am including icecream  Denies nausea or vomiting  No changes in bowel movements    HOME DIABETES REGIMEN:  Humalog mix 30 units twice daily  Farxiga 10 mg, 1 tablet daily     METER DOWNLOAD SUMMARY: n/a    DIABETIC COMPLICATIONS: Microvascular complications:  Neuropathy  Denies: CKD , retinopathy  Last eye exam: Completed > 1    Macrovascular complications:  CAD Denies:  PVD, CVA      HISTORY:  Past Medical History:  Past Medical History:  Diagnosis Date   Arthritis    All over   CAD (coronary artery disease), native coronary artery, minimal by cardiac cath 01/08/19 01/09/2019   Chronic pain syndrome    Diabetes mellitus    History of cocaine use    History of kidney stones    Hypercholesteremia    Hypertension    Low back pain    Lumbar radiculopathy    Right leg pain    Seborrheic dermatitis of scalp    Shingles    Past Surgical History:  Past Surgical History:  Procedure Laterality Date   CIRCUMCISION N/A 03/16/2018   Procedure: CIRCUMCISION ADULT WITH PENILE BLOCK;  Surgeon: Sebastian Ache, MD;  Location: [facility];  Service: Urology;  Laterality: N/A;  45 MINS   KNEE ARTHROSCOPY Bilateral    LEFT HEART CATH AND CORONARY ANGIOGRAPHY N/A 01/08/2019   Procedure: LEFT HEART CATH AND CORONARY ANGIOGRAPHY;  Surgeon: Lennette Bihari, MD;  [facility];  Service: Cardiovascular;  Laterality: N/A;   LYMPH NODE DISSECTION Left 05/13/2022   Procedure: RETROPERITONEAL LYMPH NODE DISSECTIONLYMPH NODE DISSECTION;  Surgeon: Sebastian Ache, MD;  Location: [facility];  Service: Urology;  Laterality: Left;   ROBOT ASSISTED LAPAROSCOPIC NEPHRECTOMY Left 05/13/2022   Procedure: XI  ROBOTIC ASSISTED LAPAROSCOPIC NEPHRECTOMY;  Surgeon: Sebastian Ache, MD;  Location: [facility];  Service: Urology;  Laterality: Left;  3 HRS   Social History:  reports that he has been smoking cigarettes. He has a 50 pack-year smoking history. He has quit using smokeless tobacco.  His smokeless tobacco use included chew. He reports that he does not currently use alcohol. He reports that he does not currently use drugs after having used the following drugs: Marijuana and Cocaine. Family History:  Family History  Problem Relation Age of Onset   Cancer Mother    Stroke Father    Heart disease Father    Diabetes Father      HOME MEDICATIONS: Allergies as  of 03/16/2023   No Known Allergies      Medication List        Accurate as of March 16, 2023  2:41 PM. If you have any questions, ask your nurse or doctor.          STOP taking these medications    metFORMIN 1000 MG tablet Commonly known as: GLUCOPHAGE Stopped by: Johnney Ou Rosabella Edgin       TAKE these medications    Accu-Chek Guide test strip Generic drug: glucose blood USE THREE TIMES DAILY AS DIRECTED   acetaminophen 650 MG CR tablet Commonly known as: TYLENOL Take 2-3 tablets by mouth daily as needed for pain.   albuterol 108 (90 Base) MCG/ACT inhaler Commonly known as: VENTOLIN HFA Inhale 2 puffs into the lungs every 6 (six) hours as needed for wheezing or shortness of breath.   atorvastatin 40 MG tablet Commonly known as: LIPITOR Take 40 mg by mouth daily.   BD Pen Needle Nano 2nd Gen 32G X 4 MM Misc Generic drug: Insulin Pen Needle Inject 1 Device into the skin in the morning and at bedtime.   dapagliflozin propanediol 10 MG Tabs tablet Commonly known as: Farxiga Take 1 tablet (10 mg total) by mouth daily before breakfast.   diclofenac 50 MG EC tablet Commonly known as: VOLTAREN TAKE 1 TABLET(50 MG) BY MOUTH TWICE DAILY WITH FOOD AS NEEDED   Insulin Lispro Prot & Lispro (75-25) 100 UNIT/ML Kwikpen Commonly known as: HumaLOG Mix 75/25 KwikPen Inject 30 Units into the skin in the morning and at bedtime.   pioglitazone 15 MG tablet Commonly known as: Actos Take 1 tablet (15 mg total) by mouth daily. Started by: Johnney Ou Carolann Brazell         OBJECTIVE:   Vital Signs: BP 136/78 (BP Location: Left Arm, Patient Position: Sitting, Cuff Size: Large)   Pulse 80   Ht 6' (1.829 m)   Wt 213 lb (96.6 kg)   SpO2 95%   BMI 28.89 kg/m   Wt Readings from Last 3 Encounters:  03/16/23 213 lb (96.6 kg)  02/01/23 220 lb (99.8 kg)  05/13/22 213 lb 13.5 oz (97 kg)     Exam: General: Pt appears well and is in NAD  Lungs: Clear with good BS bilat with  no rales, rhonchi, or wheezes  Heart: RRR with normal S1 and S2 and no gallops; no murmurs; no rub  Neuro: MS is good with appropriate affect, pt is alert and Ox3      DM foot exam: 03/16/2023   The skin of the feet is intact without sores or ulcerations. The pedal pulses are 2+ on right and 2+ on left. The sensation is intact at the right heel to a screening 5.07, 10 gram monofilament  DATA REVIEWED:  Lab Results  Component Value Date   HGBA1C 10.2 (A) 03/16/2023   HGBA1C 8.6 (H) 04/27/2022   HGBA1C 9.6 (A) 01/13/2022    Latest Reference Range & Units 03/16/23 09:22  Sodium 135 - 145 mEq/L 130 (L)  Potassium 3.5 - 5.1 mEq/L 5.3 (H)  Chloride 96 - 112 mEq/L 97  CO2 19 - 32 mEq/L 25  Glucose 70 - 99 mg/dL 161 (HH)  BUN 6 - 23 mg/dL 43 (H)  Creatinine 0.96 - 1.50 mg/dL 0.45 (H)  Calcium 8.4 - 10.5 mg/dL 9.6  GFR >40.98 mL/min 42.82 (L)    Latest Reference Range & Units 03/16/23 09:22  Total CHOL/HDL Ratio  5  Cholesterol 0 - 200 mg/dL 119  HDL Cholesterol >14.78 mg/dL 29.56  Direct LDL mg/dL 213.0  MICROALB/CREAT RATIO 0.0 - 30.0 mg/g 64.8 (H)  Triglycerides 0.0 - 149.0 mg/dL 865.7 Triglyceride is over 400; calculations on Lipids are invalid. (H)    Latest Reference Range & Units 03/16/23 09:22  Creatinine,U mg/dL 84.6  Microalb, Ur 0.0 - 1.9 mg/dL 96.2 (H)  MICROALB/CREAT RATIO 0.0 - 30.0 mg/g 64.8 (H)  (H): Data is abnormally high   In office BG 497 mg/DL  ASSESSMENT / PLAN / RECOMMENDATIONS:   1) Type 2  Diabetes Mellitus, poorly control, With Neuropathy, CKD III  and CAD  complications - Most recent A1c of 10.2 % . Goal A1c < 7.5 %.    -Since his last visit here, he had had left nephrectomy secondary to RCC, patient has self discontinued metformin because his sister read online that it causes cancer, I did discuss with the patient that tobacco is the main reason for cancer -Since he is not comfortable with metformin intake, we have opted to hold off on this for  now -I have attempted to switch him to multiple daily injections with basal/prandial, but he did not do well with this and we switched him back to insulin mix -He has been out of his medications, today we discussed the importance of following a low-carb diet -Historically I have prescribed Marcelline Deist for him, but it has been difficult to ask obtain if he is taking it or not -Today I have recommended starting him on Actos to replace metformin -Patient encouraged to establish with primary care, he was provided with contact information for [facility] -BMP shows low GFR with elevated MA/CR ratio   MEDICATIONS:  Stop metformin Restart Humalog mix 30 units twice daily Restart Farxiga 10 mg, 1 tablet daily  Start pioglitazone 15 mg daily  EDUCATION / INSTRUCTIONS: BG monitoring instructions: Patient is instructed to check his blood sugars 2 times a day, fasting and supper . Call [facility] if: BG persistently < 70 I reviewed the Rule of 15 for the treatment of hypoglycemia in detail with the patient. Literature supplied.  2) Diabetic complications:  Eye: Does not have known diabetic retinopathy. Neuro/ Feet: Does  have known diabetic peripheral neuropathy. Renal: Patient does not have known baseline CKD. He is on an ACEI/ARB at present   3) Hypertriglyceridemia:    -Lipid panel today shows extremely elevated triglycerides at 720 Mg/DL, I suspect he has not been taking his atorvastatin, this is also exacerbated by poorly controlled DM -Will now restart atorvastatin but will prescribe rosuvastatin as below  Medication  Stop atorvastatin 40 mg daily  Start rosuvastatin 40 mg daily    4) CKD III/Microalbuminuria :  -He is s/p nephrectomy due to [facility] 05/2022 -  Will start ARB  Medication Start losartan 25 mg daily   F/U in 4 months    Signed electronically by: Lyndle Herrlich, MD  [facility] End[facility]  [facility] [address] [place] [place], [place] [address] Phone: [phone] FAX: [phone]   CC: System, Provider Not In No address on file Phone: None  Fax: None  Return to Endocrinology clinic as below: Future Appointments  Date Time Provider Department Center  04/05/2023  9:15 AM London Sheer, MD [facility] None  08/01/2023  8:10 AM Jagar Lua, Konrad Dolores, MD [id number] None

## 2023-03-16 NOTE — Telephone Encounter (Signed)
noted 

## 2023-03-16 NOTE — Telephone Encounter (Signed)
Please let the patient know that his kidney function is low and he has chronic kidney disease, he is also leaking protein in the urine which is another sign that he has kidney damage because of diabetes.  Patient will need to start medication to protect his kidney called losartan 25 mg daily   Please remind the patient that it is very important for him to control his sugar to prevent further damage to his kidneys and to avoid dialysis   Please also let the patient know that his triglycerides are extremely high at 720 (they need to be less than 150) .  Patient needs to avoid all kind of sugars as well as fried foods  I have prescribed a new cholesterol medication for him he needs to stop atorvastatin and START rosuvastatin 40 mg every evening from now on  Please let the patient know that high triglycerides increases his risk for developing inflammation of the pancreas which is very painful abdominal pain that can be fatal in rare cases

## 2023-03-16 NOTE — Patient Instructions (Addendum)
-   Take  Humalog MIX 30 units , before breakfast and before supper - Restart Farxiga 10 mg daily  - Start Actos 15 mg daily   Establish Primary care : [facility] Address: [address], [place], [place] [address] Phone: [phone]   HOW TO TREAT LOW BLOOD SUGARS (Blood sugar LESS THAN 70 MG/DL) Please follow the RULE OF 15 for the treatment of hypoglycemia treatment (when your (blood sugars are less than 70 mg/dL)   STEP 1: Take 15 grams of carbohydrates when your blood sugar is low, which includes:  3-4 GLUCOSE TABS  OR 3-4 OZ OF JUICE OR REGULAR SODA OR ONE TUBE OF GLUCOSE GEL    STEP 2: RECHECK blood sugar in 15 MINUTES STEP 3: If your blood sugar is still low at the 15 minute recheck --> then, go back to STEP 1 and treat AGAIN with another 15 grams of carbohydrates.

## 2023-03-16 NOTE — Telephone Encounter (Signed)
Critical Lab   Glucose  548

## 2023-03-16 NOTE — Telephone Encounter (Signed)
LMTCB

## 2023-03-17 NOTE — Telephone Encounter (Signed)
LMTCB

## 2023-03-20 NOTE — Telephone Encounter (Signed)
Patient aware and verbalized understanding. Patient will pick up new medications. Spoke with [facility] and advised that they should only fill the prescription from Dr. Lonzo Cloud for the insulin.

## 2023-03-22 ENCOUNTER — Other Ambulatory Visit: Payer: Self-pay

## 2023-03-22 ENCOUNTER — Telehealth: Payer: Self-pay

## 2023-03-22 DIAGNOSIS — E1165 Type 2 diabetes mellitus with hyperglycemia: Secondary | ICD-10-CM

## 2023-03-22 MED ORDER — DAPAGLIFLOZIN PROPANEDIOL 10 MG PO TABS: 10.0000 mg | ORAL_TABLET | Freq: Every day | ORAL | 3 refills | Status: DC

## 2023-03-22 NOTE — Telephone Encounter (Signed)
Patient called stating that he didn't receive his Losartan from the pharmacy. I called and spoke with [facility] and they state patient picked up the medication on 03/17/23. Patient was advise to go home and callback and let us know exactly what he has. I did send a new prescription for the [place] as they pharmacy didn't show they had that one.  Patient advised on medication directions again. I am not sure if patient understood as he hung up phone in my face.

## 2023-03-22 NOTE — Telephone Encounter (Signed)
Patient had come into the office stating that he had received a telephone call earlier from Dr. Lonzo Cloud concerning his lab results.  Per patient he was to go to pharmacy and pick up a prescription that was sent in by Dr. Lonzo Cloud.  Patient did not know the name of the prescription.  Patient states that he went to the pharmacy twice and no prescription had been sent in per [facility].  A message was sent to the clinical staff that the patient was in the waiting room concerning the prescription.  After a few minutes the patient came back to the window and stated that he could not wait any longer and that the office needed to make sure that the prescription was sent in.

## 2023-04-03 ENCOUNTER — Ambulatory Visit: Payer: 59 | Admitting: Orthopedic Surgery

## 2023-04-05 ENCOUNTER — Ambulatory Visit (INDEPENDENT_AMBULATORY_CARE_PROVIDER_SITE_OTHER): Payer: 59 | Admitting: Orthopedic Surgery

## 2023-04-05 DIAGNOSIS — G8929 Other chronic pain: Secondary | ICD-10-CM

## 2023-04-05 DIAGNOSIS — M545 Low back pain, unspecified: Secondary | ICD-10-CM | POA: Diagnosis not present

## 2023-04-05 NOTE — Progress Notes (Signed)
Orthopedic Spine Surgery Office Note   Assessment: Patient is a 67 y.o. male with history of L3/4 TLIF and PSIF with Dr. Otelia Sergeant in 2022 who comes in with 4 months of low back pain that radiates into the bilateral buttock and the anterior thighs. Symptoms consistent with neurogenic claudication. 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      Plan: -Patient has tried Tylenol, narcotics, pain management -Recommended diagnostic/therapeutic L2/3 injection. Referral provided to Dr. Alvester Morin -Last A1C was 10.2 and he is still using nicotine so he is not a candidate for elective surgery. I talked to him about both of these issues today -Would need to be nicotine free and have a A1C of 7.5 or less prior to any elective spine surgery -Patient should return to office in 10-12 weeks, x-rays at next visit: none     Patient expressed understanding of the plan and all questions were answered to the patient's satisfaction.    ___________________________________________________________________________     History:   Patient is a 67 y.o. male who presents today for follow up on his lumbar spine.  Patient still having significant low back pain.  He notes that when he is standing or walking for more than 2 minutes.  It does get better if he sits down.  He feels the pain on a daily basis.  He is no longer having any pain rating into either lower extremity.  He has not noticed any new symptoms since the last time he was seen.    Patient rates the pain as an 8/10   Treatments tried: Tylenol, narcotics, pain management     Physical Exam:   BMI of 29.8   General: no acute distress, appears stated age Neurologic: alert, answering questions appropriately, following commands Respiratory: unlabored breathing on room air, symmetric chest rise Psychiatric: appropriate affect, normal cadence to speech     MSK (spine):   -Strength exam                                                   Left                  Right EHL                               5/5                  5/5 TA                                 5/5                  5/5 GSC                             5/5                  5/5 Knee extension            5/5                  5/5 Hip flexion                    5/5  5/5   -Sensory exam                           Sensation intact to light touch in L3-S1 nerve distributions of bilateral lower extremities   -Achilles DTR: 2/4 on the left, 2/4 on the right -Patellar tendon DTR: 2/4 on the left, 2/4 on the right   Imaging: XR of the lumbar spine from 02/01/2023 was previously independently reviewed and interpreted, showing TLIF cage in the L3/4 disc base that appears in appropriate position.  No lucency around the interbody device.  Posterior instrumentation at L3/4 that appears in appropriate position and there is no lucency around the screws.  Disc height loss at L5/S1. No other significant degenerative changes.  No evidence of instability on flexion/extension views.  No fracture or dislocation seen.  MRI of the lumbar spine from 02/13/2023 was independently reviewed and interpreted, showing central stenosis at L2/3. Possible foraminal stenosis at L4/5 on the left but difficult to tell due to artifact. No other significant stenosis seen.      Patient name: Ryan Farmer Patient MRN: [id number] Date of visit: 04/05/23

## 2023-04-20 ENCOUNTER — Ambulatory Visit (INDEPENDENT_AMBULATORY_CARE_PROVIDER_SITE_OTHER): Payer: 59

## 2023-04-20 ENCOUNTER — Encounter (HOSPITAL_COMMUNITY): Payer: Self-pay

## 2023-04-20 ENCOUNTER — Ambulatory Visit (HOSPITAL_COMMUNITY)
Admission: EM | Admit: 2023-04-20 | Discharge: 2023-04-20 | Disposition: A | Discharge status: Home / Self Care | Payer: 59 | Attending: Family Medicine | Admitting: Family Medicine

## 2023-04-20 DIAGNOSIS — K0889 Other specified disorders of teeth and supporting structures: Secondary | ICD-10-CM | POA: Diagnosis not present

## 2023-04-20 DIAGNOSIS — R053 Chronic cough: Secondary | ICD-10-CM | POA: Diagnosis not present

## 2023-04-20 MED ORDER — PANTOPRAZOLE SODIUM 40 MG PO TBEC: 40.0000 mg | DELAYED_RELEASE_TABLET | Freq: Every day | ORAL | 0 refills | Status: DC

## 2023-04-20 MED ORDER — AMOXICILLIN-POT CLAVULANATE 875-125 MG PO TABS: 1.0000 | ORAL_TABLET | Freq: Two times a day (BID) | ORAL | 0 refills | Status: DC

## 2023-04-20 NOTE — ED Triage Notes (Signed)
Cough and chest congestion for the last few months. Was given antibiotics a month ago with no relief.

## 2023-04-20 NOTE — ED Provider Notes (Signed)
[facility]   [id number] 04/20/23 Arrival Time: 1158  ASSESSMENT & PLAN:  1. Chronic cough   2. Pain, dental    Unclear cause of chronic cough. Denies smoking currently but h/o. I have personally viewed and independently interpreted the imaging studies ordered this visit. CXR: no acute findings.  Ques GERD related. Doesn't mind trying trial of PPI. Augmentin for dental pain; has dental f/u planned but cost is limiting him.  Discharge Medication List as of 04/20/2023  2:03 PM     START taking these medications   Details  amoxicillin-clavulanate (AUGMENTIN) 875-125 MG tablet Take 1 tablet by mouth every 12 (twelve) hours., Starting Thu 04/20/2023, Normal    pantoprazole (PROTONIX) 40 MG tablet Take 1 tablet (40 mg total) by mouth daily., Starting Thu 04/20/2023, Normal         Follow-up Information     Schedule an appointment as soon as possible for a visit  with [facility].   Contact information: [address] [place] [address] [phone]                Reviewed expectations re: course of current medical issues. Questions answered. Outlined signs and symptoms indicating need for more acute intervention. Understanding verbalized. After Visit Summary given.   SUBJECTIVE: History from: Patient. Ryan Farmer is a 67 y.o. male. Reports: persistent cough; several months. Social History   Tobacco Use  Smoking Status Every Day   Current packs/day: 1.00   Average packs/day: 1 pack/day for 50.0 years (50.0 ttl pk-yrs)   Types: Cigarettes  Smokeless Tobacco Former   Types: Chew  Denies CP/SOB. Cough waxes/wanes at times. Denies abd/back pain. Normal appetite. Wt is stable. Also mentions L lower dental pain; on/off for sev weeks; worse past few days.   OBJECTIVE:  Vitals:   04/20/23 1258  BP: 128/80  Pulse: 64  Resp: 18  Temp: 97.6 F (36.4 C)  TempSrc: Oral  SpO2: 94%   Weight: 95.3 kg  Height: 6' (1.829 m)    General appearance: alert; no distress Eyes: PERRLA; EOMI; conjunctiva normal HENT: [place]; AT; with mild nasal congestion Dentition: poor; multiple caries; lower L gum with mild TTP and no fluctuance Neck: supple  Lungs: speaks full sentences without difficulty; unlabored; CTAB Extremities: no edema Skin: warm and dry Neurologic: normal gait Psychological: alert and cooperative; normal mood and affect   No Known Allergies  Past Medical History:  Diagnosis Date   Arthritis    All over   CAD (coronary artery disease), native coronary artery, minimal by cardiac cath 01/08/19 01/09/2019   Chronic pain syndrome    Diabetes mellitus    History of cocaine use    History of kidney stones    Hypercholesteremia    Hypertension    Low back pain    Lumbar radiculopathy    Right leg pain    Seborrheic dermatitis of scalp    Shingles    Social History   Socioeconomic History   Marital status: Single    Spouse name: Not on file   Number of children: Not on file   Years of education: Not on file   Highest education level: Not on file  Occupational History   Not on file  Tobacco Use   Smoking status: Every Day    Current packs/day: 1.00    Average packs/day: 1 pack/day for 50.0 years (50.0 ttl pk-yrs)    Types: Cigarettes   Smokeless  tobacco: Former    Types: Associate Professor status: Never Used  Substance and Sexual Activity   Alcohol use: Not Currently    Comment: occ   Drug use: Not Currently    Types: Marijuana, Cocaine    Comment: twice a week   Sexual activity: Not on file  Other Topics Concern   Not on file  Social History Narrative   Not on file   Social Determinants of Health   Financial Resource Strain: Not on file  Food Insecurity: No Food Insecurity (05/13/2022)   Hunger Vital Sign    Worried About Running Out of Food in the Last Year: Never true    Ran Out of Food in the Last Year: Never true   Transportation Needs: No Transportation Needs (05/13/2022)   PRAPARE - Administrator, Civil Service (Medical): No    Lack of Transportation (Non-Medical): No  Physical Activity: Unknown (01/09/2019)   Exercise Vital Sign    Days of Exercise per Week: 0 days    Minutes of Exercise per Session: Not on file  Stress: Stress Concern Present (01/09/2019)   [employer] of Occupational Health - Occupational Stress Questionnaire    Feeling of Stress : To some extent  Social Connections: Unknown (01/09/2019)   Social Connection and Isolation Panel [NHANES]    Frequency of Communication with Friends and Family: Patient declined    Frequency of Social Gatherings with Friends and Family: Patient declined    Attends Religious Services: Patient declined    Active Member of Clubs or Organizations: Patient declined    Attends Banker Meetings: Patient declined    Marital Status: Patient declined  Intimate Partner Violence: Not At Risk (05/13/2022)   Humiliation, Afraid, Rape, and Kick questionnaire    Fear of Current or Ex-Partner: No    Emotionally Abused: No    Physically Abused: No    Sexually Abused: No   Family History  Problem Relation Age of Onset   Cancer Mother    Stroke Father    Heart disease Father    Diabetes Father    Past Surgical History:  Procedure Laterality Date   CIRCUMCISION N/A 03/16/2018   Procedure: CIRCUMCISION ADULT WITH PENILE BLOCK;  Surgeon: Sebastian Ache, MD;  Location: [facility] [place];  Service: Urology;  Laterality: N/A;  45 MINS   KNEE ARTHROSCOPY Bilateral    LEFT HEART CATH AND CORONARY ANGIOGRAPHY N/A 01/08/2019   Procedure: LEFT HEART CATH AND CORONARY ANGIOGRAPHY;  Surgeon: Lennette Bihari, MD;  Location: [facility];  Service: Cardiovascular;  Laterality: N/A;   LYMPH NODE DISSECTION Left 05/13/2022   Procedure: RETROPERITONEAL LYMPH NODE DISSECTIONLYMPH NODE DISSECTION;  Surgeon: Sebastian Ache, MD;  Location:  [facility];  Service: Urology;  Laterality: Left;   ROBOT ASSISTED LAPAROSCOPIC NEPHRECTOMY Left 05/13/2022   Procedure: XI ROBOTIC ASSISTED LAPAROSCOPIC NEPHRECTOMY;  Surgeon: Sebastian Ache, MD;  Location: [facility];  Service: Urology;  Laterality: Left;  3 HRS     Mardella Layman, MD 04/20/23 1513

## 2023-04-25 ENCOUNTER — Telehealth: Payer: Self-pay | Admitting: *Deleted

## 2023-04-25 ENCOUNTER — Ambulatory Visit: Payer: 59 | Admitting: Physical Medicine and Rehabilitation

## 2023-04-25 ENCOUNTER — Other Ambulatory Visit: Payer: Self-pay

## 2023-04-25 ENCOUNTER — Ambulatory Visit: Payer: 59 | Admitting: Family Medicine

## 2023-04-25 VITALS — BP 103/69 | HR 92

## 2023-04-25 DIAGNOSIS — M5416 Radiculopathy, lumbar region: Secondary | ICD-10-CM | POA: Diagnosis not present

## 2023-04-25 MED ORDER — METHYLPREDNISOLONE ACETATE 40 MG/ML IJ SUSP
40.0000 mg | Freq: Once | INTRAMUSCULAR | Status: AC
  Administered 2023-04-25: 40 mg

## 2023-04-25 NOTE — Patient Instructions (Signed)

## 2023-04-25 NOTE — Progress Notes (Signed)
Functional Pain Scale - descriptive words and definitions  Unmanageable (7)  Pain interferes with normal ADL's/nothing seems to help/sleep is very difficult/active distractions are very difficult to concentrate on. Severe range order  Average Pain 10   +Driver, -BT, -Dye Allergies.  Lower back pain in the middle that radiates in the legs

## 2023-04-25 NOTE — Telephone Encounter (Signed)
04/25/23 - NP blocked from future scheduling.

## 2023-05-01 ENCOUNTER — Ambulatory Visit: Payer: 59 | Attending: Physician Assistant | Admitting: Physician Assistant

## 2023-05-01 NOTE — Progress Notes (Signed)
This encounter was created in error - please disregard.

## 2023-05-09 NOTE — Progress Notes (Signed)
Ryan Farmer - 67 y.o. male MRN [id number]  Date of birth: Apr 23, 1956  Office Visit Note: Visit Date: 04/25/2023 PCP: System, Provider Not In Referred by: London Sheer, MD  Subjective: Chief Complaint  Patient presents with   Lower Back - Pain   HPI:  Ryan Farmer is a 67 y.o. male who comes in today at the request of Dr. Willia Craze for planned Midline L2-3 Lumbar Interlaminar epidural steroid injection with fluoroscopic guidance.  The patient has failed conservative care including home exercise, medications, time and activity modification.  This injection will be diagnostic and hopefully therapeutic.  Please see requesting physician notes for further details and justification.   ROS Otherwise per HPI.  Assessment & Plan: Visit Diagnoses:    ICD-10-CM   1. Lumbar radiculopathy  M54.16 XR C-ARM NO REPORT    Epidural Steroid injection    methylPREDNISolone acetate (DEPO-MEDROL) injection 40 mg      Plan: No additional findings.   Meds & Orders:  Meds ordered this encounter  Medications   methylPREDNISolone acetate (DEPO-MEDROL) injection 40 mg    Orders Placed This Encounter  Procedures   XR C-ARM NO REPORT   Epidural Steroid injection    Follow-up: Return for visit to requesting provider as needed.   Procedures: No procedures performed  Lumbar Epidural Steroid Injection - Interlaminar Approach with Fluoroscopic Guidance  Patient: Ryan Farmer      Date of Birth: Sep 03, 1955 MRN: [id number] PCP: System, Provider Not In      Visit Date: 04/25/2023   Universal Protocol:     Consent Given By: the patient  Position: PRONE  Additional Comments: Vital signs were monitored before and after the procedure. Patient was prepped and draped in the usual sterile fashion. The correct patient, procedure, and site was verified.   Injection Procedure Details:   Procedure diagnoses: Lumbar radiculopathy [M54.16]   Meds Administered:  Meds ordered this encounter   Medications   methylPREDNISolone acetate (DEPO-MEDROL) injection 40 mg     Laterality: Midline  Location/Site:  L2-3  Needle: 3.5 in., 20 ga. Tuohy  Needle Placement: Paramedian epidural  Findings:   -Comments: Excellent flow of contrast into the epidural space.  Procedure Details: Using a paramedian approach from the side mentioned above, the region overlying the inferior lamina was localized under fluoroscopic visualization and the soft tissues overlying this structure were infiltrated with 4 ml. of 1% Lidocaine without Epinephrine. The Tuohy needle was inserted into the epidural space using a paramedian approach.   The epidural space was localized using loss of resistance along with counter oblique bi-planar fluoroscopic views.  After negative aspirate for air, blood, and CSF, a 2 ml. volume of Isovue-250 was injected into the epidural space and the flow of contrast was observed. Radiographs were obtained for documentation purposes.    The injectate was administered into the level noted above.   Additional Comments:  The patient tolerated the procedure well Dressing: 2 x 2 sterile gauze and Band-Aid    Post-procedure details: Patient was observed during the procedure. Post-procedure instructions were reviewed.  Patient left the clinic in stable condition.   Clinical History: CLINICAL DATA:  Low back pain that radiates into the buttocks and both legs. Bilateral foot numbness.   EXAM: MRI LUMBAR SPINE WITHOUT CONTRAST   TECHNIQUE: Multiplanar, multisequence MR imaging of the lumbar spine was performed. No intravenous contrast was administered.   COMPARISON:  03/14/2021   FINDINGS: Segmentation:  Standard.   Alignment:  Grade 1 retrolisthesis at L2-3   Vertebrae:  No acute abnormality.  L3-4 PLIF.   Conus medullaris and cauda equina: Conus extends to the L1 level. Conus and cauda equina appear normal.   Paraspinal and other soft tissues: Negative.   Disc  levels:   T12-L1: Normal.   L1-L2: Unchanged small disc bulge. No spinal canal stenosis. No neural foraminal stenosis.   L2-L3: Worsened intermediate sized disc bulge. Worsened moderate spinal canal stenosis. Unchanged mild right neural foraminal stenosis.   L3-L4: Interval PLIF with partial discectomy. Greatly improved patency of the thecal sac. Left lateral recess narrowing without central spinal canal stenosis. Limited visualization of the neural foramina due to susceptibility effects from spinal hardware, but likely mild right neural foraminal stenosis.   L4-L5: Slightly worsened small disc bulge. Narrowing of both lateral recesses without central spinal canal stenosis. No neural foraminal stenosis.   L5-S1: Small central disc protrusion, slightly worsened. No spinal canal stenosis. No neural foraminal stenosis.   Visualized sacrum: Normal.   IMPRESSION: 1. Interval L3-4 PLIF with partial discectomy. Greatly improved patency of the thecal sac at this level. Left lateral recess narrowing and mild right neural foraminal stenosis. 2. Worsened moderate spinal canal stenosis at L2-3 with unchanged mild right neural foraminal stenosis. 3. Slight worsening of L4-5 disc bulge with bilateral lateral recess narrowing. 4. Slight worsening of small central disc protrusion at L5-S1 without spinal canal or neural foraminal stenosis.     Electronically Signed   By: Deatra Robinson M.D.   On: 02/23/2023 17:49     Objective:  VS:  HT:    WT:   BMI:     BP:103/69  HR:92bpm  TEMP: ( )  RESP:  Physical Exam Vitals and nursing note reviewed.  Constitutional:      General: He is not in acute distress.    Appearance: Normal appearance. He is not ill-appearing.  HENT:     Head: Normocephalic and atraumatic.     Right Ear: External ear normal.     Left Ear: External ear normal.     Nose: No congestion.  Eyes:     Extraocular Movements: Extraocular movements intact.   Cardiovascular:     Rate and Rhythm: Normal rate.     Pulses: Normal pulses.  Pulmonary:     Effort: Pulmonary effort is normal. No respiratory distress.  Abdominal:     General: There is no distension.     Palpations: Abdomen is soft.  Musculoskeletal:        General: No tenderness or signs of injury.     Cervical back: Neck supple.     Right lower leg: No edema.     Left lower leg: No edema.     Comments: Patient has good distal strength without clonus.  Skin:    Findings: No erythema or rash.  Neurological:     General: No focal deficit present.     Mental Status: He is alert and oriented to person, place, and time.     Sensory: No sensory deficit.     Motor: No weakness or abnormal muscle tone.     Coordination: Coordination normal.  Psychiatric:        Mood and Affect: Mood normal.        Behavior: Behavior normal.      Imaging: No results found.

## 2023-05-09 NOTE — Procedures (Signed)
Lumbar Epidural Steroid Injection - Interlaminar Approach with Fluoroscopic Guidance  Patient: Ryan Farmer      Date of Birth: 1956-05-29 MRN: [id number] PCP: System, Provider Not In      Visit Date: 04/25/2023   Universal Protocol:     Consent Given By: the patient  Position: PRONE  Additional Comments: Vital signs were monitored before and after the procedure. Patient was prepped and draped in the usual sterile fashion. The correct patient, procedure, and site was verified.   Injection Procedure Details:   Procedure diagnoses: Lumbar radiculopathy [M54.16]   Meds Administered:  Meds ordered this encounter  Medications   methylPREDNISolone acetate (DEPO-MEDROL) injection 40 mg     Laterality: Midline  Location/Site:  L2-3  Needle: 3.5 in., 20 ga. Tuohy  Needle Placement: Paramedian epidural  Findings:   -Comments: Excellent flow of contrast into the epidural space.  Procedure Details: Using a paramedian approach from the side mentioned above, the region overlying the inferior lamina was localized under fluoroscopic visualization and the soft tissues overlying this structure were infiltrated with 4 ml. of 1% Lidocaine without Epinephrine. The Tuohy needle was inserted into the epidural space using a paramedian approach.   The epidural space was localized using loss of resistance along with counter oblique bi-planar fluoroscopic views.  After negative aspirate for air, blood, and CSF, a 2 ml. volume of Isovue-250 was injected into the epidural space and the flow of contrast was observed. Radiographs were obtained for documentation purposes.    The injectate was administered into the level noted above.   Additional Comments:  The patient tolerated the procedure well Dressing: 2 x 2 sterile gauze and Band-Aid    Post-procedure details: Patient was observed during the procedure. Post-procedure instructions were reviewed.  Patient left the clinic in stable  condition.

## 2023-07-04 ENCOUNTER — Other Ambulatory Visit: Payer: Self-pay | Admitting: Internal Medicine

## 2023-07-06 ENCOUNTER — Other Ambulatory Visit: Payer: Self-pay

## 2023-07-06 DIAGNOSIS — E1165 Type 2 diabetes mellitus with hyperglycemia: Secondary | ICD-10-CM

## 2023-07-06 MED ORDER — DAPAGLIFLOZIN PROPANEDIOL 10 MG PO TABS: 10.0000 mg | ORAL_TABLET | Freq: Every day | ORAL | 3 refills | Status: DC

## 2023-07-06 NOTE — Telephone Encounter (Signed)
Strip request refill complete

## 2023-07-27 ENCOUNTER — Ambulatory Visit: Payer: 59 | Admitting: Internal Medicine

## 2023-08-01 ENCOUNTER — Ambulatory Visit (INDEPENDENT_AMBULATORY_CARE_PROVIDER_SITE_OTHER): Payer: 59 | Admitting: Internal Medicine

## 2023-08-01 ENCOUNTER — Encounter: Payer: Self-pay | Admitting: Internal Medicine

## 2023-08-01 VITALS — BP 124/64 | HR 57

## 2023-08-01 DIAGNOSIS — E1165 Type 2 diabetes mellitus with hyperglycemia: Secondary | ICD-10-CM | POA: Diagnosis not present

## 2023-08-01 DIAGNOSIS — Z794 Long term (current) use of insulin: Secondary | ICD-10-CM | POA: Diagnosis not present

## 2023-08-01 LAB — POCT GLYCOSYLATED HEMOGLOBIN (HGB A1C): Hemoglobin A1C: 10.6 % — AB (ref 4.0–5.6)

## 2023-08-01 MED ORDER — LOSARTAN POTASSIUM 25 MG PO TABS: 25.0000 mg | ORAL_TABLET | Freq: Every day | ORAL | 3 refills | Status: DC

## 2023-08-01 MED ORDER — BD PEN NEEDLE NANO 2ND GEN 32G X 4 MM MISC: 1.0000 | Freq: Two times a day (BID) | 3 refills | Status: DC

## 2023-08-01 MED ORDER — OMNIPOD 5 G7 INTRO (GEN 5) KIT
1.0000 | PACK | 0 refills | Status: DC
Start: 2023-08-01 — End: 2023-08-03

## 2023-08-01 MED ORDER — DAPAGLIFLOZIN PROPANEDIOL 10 MG PO TABS: 10.0000 mg | ORAL_TABLET | Freq: Every day | ORAL | 3 refills | Status: DC

## 2023-08-01 MED ORDER — ROSUVASTATIN CALCIUM 40 MG PO TABS: 40.0000 mg | ORAL_TABLET | Freq: Every day | ORAL | 3 refills | Status: DC

## 2023-08-01 MED ORDER — DEXCOM G7 SENSOR MISC: 1.0000 | 3 refills | Status: DC

## 2023-08-01 MED ORDER — OMNIPOD 5 G7 PODS (GEN 5) MISC: 1.0000 | 3 refills | Status: DC

## 2023-08-01 MED ORDER — INSULIN LISPRO PROT & LISPRO (75-25 MIX) 100 UNIT/ML KWIKPEN: 36.0000 [IU] | PEN_INJECTOR | Freq: Two times a day (BID) | SUBCUTANEOUS | 3 refills | Status: DC

## 2023-08-01 MED ORDER — PIOGLITAZONE HCL 30 MG PO TABS: 30.0000 mg | ORAL_TABLET | Freq: Every day | ORAL | 3 refills | Status: DC

## 2023-08-01 NOTE — Patient Instructions (Addendum)
-  Increase Humalog MIX 36 units , before breakfast and before supper -Continue Farxiga 10 mg daily  -Increase Actos 30 mg daily     HOW TO TREAT LOW BLOOD SUGARS (Blood sugar LESS THAN 70 MG/DL) Please follow the RULE OF 15 for the treatment of hypoglycemia treatment (when your (blood sugars are less than 70 mg/dL)   STEP 1: Take 15 grams of carbohydrates when your blood sugar is low, which includes:  3-4 GLUCOSE TABS  OR 3-4 OZ OF JUICE OR REGULAR SODA OR ONE TUBE OF GLUCOSE GEL    STEP 2: RECHECK blood sugar in 15 MINUTES STEP 3: If your blood sugar is still low at the 15 minute recheck --> then, go back to STEP 1 and treat AGAIN with another 15 grams of carbohydrates.

## 2023-08-01 NOTE — Progress Notes (Signed)
Name: Ryan Farmer  Age/ Sex: 68 y.o., male   MRN/ DOB: [id number], 08-26-1955     PCP: System, Provider Not In   Reason for Endocrinology Evaluation: Type 2 Diabetes Mellitus  Initial Endocrine Consultative Visit: 04/15/2019    PATIENT IDENTIFIER: Ryan Farmer is a 68 y.o. male with a past medical history of CAD, T2DM, Hyperlipidemia and polysubstance abuse, s/p left nephrectomy due to [facility] 05/2022. The patient has followed with Endocrinology clinic since 04/15/2019 for consultative assistance with management of his diabetes.  DIABETIC HISTORY:  Ryan Farmer was diagnosed with DM in 2011. He has been on SU in the past, has been on metformin since diagnosis, insulin started in 2018. His hemoglobin A1c has ranged from 6.4% in 2017, peaking at 12.6% in 2020.  On his initial visit to our clinic he was on Lantus and Metformin with an A1c 10.4% . We stopped lantus and started a novolog Mix.    We attempted MDI regimen in 2022 but the patient did not like that regimen as he felt the insulin works were stronger and he was switched back July 2023  Lives with sister, on disability ( can not read or write)    Discontinued metformin 03/2023 based on his request due to skepticism about side effects Started pioglitazone 03/2023  SUBJECTIVE:   During the last visit (03/16/2023): A1c 10.2%    Today (08/01/2023): Ryan Farmer is here for a follow up on diabetes management.  Checks glucose occasionally , no hypoglycemia  Denies nausea or vomiting  No changes in bowel movements NO LE edema    HOME DIABETES REGIMEN:  Humalog mix 30 units twice daily Farxiga 10 mg, 1 tablet daily  Pioglitazone 15 mg daily Rosuvastatin 40 mg daily Losartan 25 mg daily   METER DOWNLOAD SUMMARY: 1/7-1/21/2025  Average Number Tests/Day = 0.2 Overall Mean FS Glucose = 329 Standard Deviation = 79  BG Ranges: Low = 252 High = 409  BG Target % Results: % In target = 0 % Over target = 100 % Under target  = 0  Hypoglycemic Events/30 Days: BG < 50 = 0 Episodes of symptomatic severe hypoglycemia = 0      DIABETIC COMPLICATIONS: Microvascular complications:  Neuropathy  Denies: CKD , retinopathy  Last eye exam: Completed > 1    Macrovascular complications:  CAD Denies: PVD, CVA      HISTORY:  Past Medical History:  Past Medical History:  Diagnosis Date   Arthritis    All over   CAD (coronary artery disease), native coronary artery, minimal by cardiac cath 01/08/19 01/09/2019   Chronic pain syndrome    Diabetes mellitus    History of cocaine use    History of kidney stones    Hypercholesteremia    Hypertension    Low back pain    Lumbar radiculopathy    Right leg pain    Seborrheic dermatitis of scalp    Shingles    Past Surgical History:  Past Surgical History:  Procedure Laterality Date   CIRCUMCISION N/A 03/16/2018   Procedure: CIRCUMCISION ADULT WITH PENILE BLOCK;  Surgeon: Sebastian Ache, MD;  Location: [facility] [place];  Service: Urology;  Laterality: N/A;  45 MINS   KNEE ARTHROSCOPY Bilateral    LEFT HEART CATH AND CORONARY ANGIOGRAPHY N/A 01/08/2019   Procedure: LEFT HEART CATH AND CORONARY ANGIOGRAPHY;  Surgeon: Lennette Bihari, MD;  Location: [facility];  Service: Cardiovascular;  Laterality: N/A;   LYMPH  NODE DISSECTION Left 05/13/2022   Procedure: RETROPERITONEAL LYMPH NODE DISSECTIONLYMPH NODE DISSECTION;  Surgeon: Sebastian Ache, MD;  Location: [facility];  Service: Urology;  Laterality: Left;   ROBOT ASSISTED LAPAROSCOPIC NEPHRECTOMY Left 05/13/2022   Procedure: XI ROBOTIC ASSISTED LAPAROSCOPIC NEPHRECTOMY;  Surgeon: Sebastian Ache, MD;  Location: [facility];  Service: Urology;  Laterality: Left;  3 HRS   Social History:  reports that he has been smoking cigarettes. He has a 50 pack-year smoking history. He has quit using smokeless tobacco.  His smokeless tobacco use included chew. He reports that he does not currently use alcohol. He reports  that he does not currently use drugs after having used the following drugs: Marijuana and Cocaine. Family History:  Family History  Problem Relation Age of Onset   Cancer Mother    Stroke Father    Heart disease Father    Diabetes Father      HOME MEDICATIONS: Allergies as of 08/01/2023   No Known Allergies      Medication List        Accurate as of August 01, 2023  8:23 AM. If you have any questions, ask your nurse or doctor.          Accu-Chek Guide Test test strip Generic drug: glucose blood USE THREE TIMES DAILY AS DIRECTED   acetaminophen 650 MG CR tablet Commonly known as: TYLENOL Take 2-3 tablets by mouth daily as needed for pain.   albuterol 108 (90 Base) MCG/ACT inhaler Commonly known as: VENTOLIN HFA Inhale 2 puffs into the lungs every 6 (six) hours as needed for wheezing or shortness of breath.   amoxicillin-clavulanate 875-125 MG tablet Commonly known as: AUGMENTIN Take 1 tablet by mouth every 12 (twelve) hours.   BD Pen Needle Nano 2nd Gen 32G X 4 MM Misc Generic drug: Insulin Pen Needle Inject 1 Device into the skin in the morning and at bedtime.   dapagliflozin propanediol 10 MG Tabs tablet Commonly known as: Farxiga Take 1 tablet (10 mg total) by mouth daily before breakfast.   diclofenac 50 MG EC tablet Commonly known as: VOLTAREN TAKE 1 TABLET(50 MG) BY MOUTH TWICE DAILY WITH FOOD AS NEEDED   Insulin Lispro Prot & Lispro (75-25) 100 UNIT/ML Kwikpen Commonly known as: HumaLOG Mix 75/25 KwikPen Inject 30 Units into the skin in the morning and at bedtime.   losartan 25 MG tablet Commonly known as: COZAAR Take 1 tablet (25 mg total) by mouth daily.   pantoprazole 40 MG tablet Commonly known as: Protonix Take 1 tablet (40 mg total) by mouth daily.   pioglitazone 15 MG tablet Commonly known as: Actos Take 1 tablet (15 mg total) by mouth daily.   rosuvastatin 40 MG tablet Commonly known as: CRESTOR Take 1 tablet (40 mg total) by  mouth daily.         OBJECTIVE:   Vital Signs: There were no vitals taken for this visit.  Wt Readings from Last 3 Encounters:  04/20/23 210 lb (95.3 kg)  03/16/23 213 lb (96.6 kg)  02/01/23 220 lb (99.8 kg)     Exam: General: Pt appears well and is in NAD  Lungs: Clear with good BS bilat   Heart: RRR   Neuro: MS is good with appropriate affect, pt is alert and Ox3      DM foot exam: 03/16/2023   The skin of the feet is intact without sores or ulcerations. The pedal pulses are 2+ on right and 2+ on left. The sensation is  intact at the right heel to a screening 5.07, 10 gram monofilament    DATA REVIEWED:  Lab Results  Component Value Date   HGBA1C 10.6 (A) 08/01/2023   HGBA1C 10.2 (A) 03/16/2023   HGBA1C 8.6 (H) 04/27/2022    Latest Reference Range & Units 03/16/23 09:22  Sodium 135 - 145 mEq/L 130 (L)  Potassium 3.5 - 5.1 mEq/L 5.3 (H)  Chloride 96 - 112 mEq/L 97  CO2 19 - 32 mEq/L 25  Glucose 70 - 99 mg/dL 295 (HH)  BUN 6 - 23 mg/dL 43 (H)  Creatinine 2.84 - 1.50 mg/dL 1.32 (H)  Calcium 8.4 - 10.5 mg/dL 9.6  GFR >44.01 mL/min 42.82 (L)    Latest Reference Range & Units 03/16/23 09:22  Total CHOL/HDL Ratio  5  Cholesterol 0 - 200 mg/dL 027  HDL Cholesterol >25.36 mg/dL 64.40  Direct LDL mg/dL 347.4  MICROALB/CREAT RATIO 0.0 - 30.0 mg/g 64.8 (H)  Triglycerides 0.0 - 149.0 mg/dL 259.5 Triglyceride is over 400; calculations on Lipids are invalid. (H)    Latest Reference Range & Units 03/16/23 09:22  Creatinine,U mg/dL 63.8  Microalb, Ur 0.0 - 1.9 mg/dL 75.6 (H)  MICROALB/CREAT RATIO 0.0 - 30.0 mg/g 64.8 (H)  (H): Data is abnormally high   In office BG 497 mg/DL  ASSESSMENT / PLAN / RECOMMENDATIONS:   1) Type 2  Diabetes Mellitus, poorly control, With Neuropathy, CKD III  and CAD  complications - Most recent A1c of 10.6 % . Goal A1c < 7.5 %.    -A1c continues to be elevated despite restarting his glycemic agents, he has not been able to verify his  oral glycemic agents, patient states that he takes what ever has been prescribed -We discontinue metformin based on his request -I will increase pioglitazone -I will also increase his insulin as below -I have recommended Dexcom/insulin pump technology, referral to our CDE for OmniPod training was placed and a prescription will be faxed to her DME supplier  MEDICATIONS:  Increase Humalog mix 36 units twice daily Continue Farxiga 10 mg, 1 tablet daily  Increase pioglitazone 30 mg daily  EDUCATION / INSTRUCTIONS: BG monitoring instructions: Patient is instructed to check his blood sugars 2 times a day, fasting and supper . Call [facility] if: BG persistently < 70 I reviewed the Rule of 15 for the treatment of hypoglycemia in detail with the patient. Literature supplied.  2) Diabetic complications:  Eye: Does not have known diabetic retinopathy. Neuro/ Feet: Does  have known diabetic peripheral neuropathy. Renal: Patient does not have known baseline CKD. He is on an ACEI/ARB at present   3) Hypertriglyceridemia:   -I had switched atorvastatin to rosuvastatin with a triglyceride of 720 Mg/DL in 10/3327 -Patient tells me he has not taken antilipid agents in months -We discussed triglycerides being at 720 Mg/DL and his increased risk of pancreatitis -A new referral was placed  Medication   rosuvastatin 40 mg daily    4) CKD III/Microalbuminuria :  -He is s/p nephrectomy due to [facility] 05/2022 -Started ARB 03/2023  Medication Continue losartan 25 mg daily   F/U in 4 months    Signed electronically by: Lyndle Herrlich, MD  [facility] End[facility]  [facility] [address] [place], [place] [address] Phone: [phone] FAX: [phone]   CC: System, Provider Not In No address on file Phone: None  Fax: None  Return to Endocrinology clinic as below: No future appointments.

## 2023-08-03 ENCOUNTER — Telehealth: Payer: Self-pay

## 2023-08-03 ENCOUNTER — Encounter: Payer: Self-pay | Admitting: Internal Medicine

## 2023-08-03 MED ORDER — OMNIPOD 5 G7 PODS (GEN 5) MISC: 1.0000 | 3 refills | Status: DC

## 2023-08-03 MED ORDER — OMNIPOD 5 G7 INTRO (GEN 5) KIT: 1.0000 | PACK | 0 refills | Status: DC

## 2023-08-03 MED ORDER — DEXCOM G7 SENSOR MISC: 1.0000 | 3 refills | Status: DC

## 2023-08-03 NOTE — Telephone Encounter (Signed)
Prescription sent to [facility].

## 2023-09-13 ENCOUNTER — Ambulatory Visit (HOSPITAL_COMMUNITY)
Admission: EM | Admit: 2023-09-13 | Discharge: 2023-09-13 | Disposition: A | Discharge status: Home / Self Care | Attending: Physician Assistant | Admitting: Physician Assistant

## 2023-09-13 ENCOUNTER — Encounter (HOSPITAL_COMMUNITY): Payer: Self-pay

## 2023-09-13 DIAGNOSIS — L089 Local infection of the skin and subcutaneous tissue, unspecified: Secondary | ICD-10-CM

## 2023-09-13 DIAGNOSIS — L729 Follicular cyst of the skin and subcutaneous tissue, unspecified: Secondary | ICD-10-CM

## 2023-09-13 DIAGNOSIS — S53401A Unspecified sprain of right elbow, initial encounter: Secondary | ICD-10-CM

## 2023-09-13 DIAGNOSIS — L02212 Cutaneous abscess of back [any part, except buttock]: Secondary | ICD-10-CM | POA: Diagnosis not present

## 2023-09-13 MED ORDER — CEPHALEXIN 500 MG PO CAPS: 500.0000 mg | ORAL_CAPSULE | Freq: Three times a day (TID) | ORAL | 0 refills | Status: DC

## 2023-09-13 MED ORDER — SULFAMETHOXAZOLE-TRIMETHOPRIM 800-160 MG PO TABS: 1.0000 | ORAL_TABLET | Freq: Two times a day (BID) | ORAL | 0 refills | Status: AC

## 2023-09-13 MED ORDER — HYDROCODONE-ACETAMINOPHEN 5-325 MG PO TABS: 0.5000 | ORAL_TABLET | Freq: Two times a day (BID) | ORAL | 0 refills | Status: AC | PRN

## 2023-09-13 MED ORDER — LIDOCAINE-EPINEPHRINE 1 %-1:100000 IJ SOLN
INTRAMUSCULAR | Status: AC
  Filled 2023-09-13: qty 1

## 2023-09-13 NOTE — ED Provider Notes (Signed)
 [facility]    CSN: [id number] Arrival date & time: 09/13/23  1147      History   Chief Complaint Chief Complaint  Patient presents with   Cyst    HPI Ryan Farmer is a 68 y.o. male.   Patient presents today with several concerns.  His primary concern today is a painful abscess on his back.  He has a history of cysts and this has gradually been enlarging over the past several weeks.  It was initially present 10 years ago.  He reports that the pain is a 9 on a 0-10 pain scale, described as burning/sharp, worse with palpation or manipulation, no alleviating factors notified.  He denies any recent antibiotics.  Denies recent surgical procedure history of MRSA.  He has never had these drained in the past.  He does have a history of diabetes but reports that his blood sugars adequately controlled.  In addition, he reports a 85-month history of right elbow pain.  He reports that this is intermittent without identifiable trigger.  He has not been taking any over-the-counter medication for symptom management.  He is right-handed.  He denies any numbness or paresthesias in his hand.  He does not remember any traumatic incidents or recent falls.    Past Medical History:  Diagnosis Date   Arthritis    All over   CAD (coronary artery disease), native coronary artery, minimal by cardiac cath 01/08/19 01/09/2019   Chronic pain syndrome    Diabetes mellitus    History of cocaine use    History of kidney stones    Hypercholesteremia    Hypertension    Low back pain    Lumbar radiculopathy    Right leg pain    Seborrheic dermatitis of scalp    Shingles     Patient Active Problem List   Diagnosis Date Noted   Long term current use of oral hypoglycemic drug 03/16/2023   Dyslipidemia 03/16/2023   Type 2 diabetes mellitus with stage 3b chronic kidney disease, with long-term current use of insulin (HCC) 03/16/2023   Renal mass 05/13/2022   Spinal stenosis of lumbosacral region  04/21/2021   Spondylolisthesis, lumbar region    Palmar wart 04/05/2021   Acute non-recurrent maxillary sinusitis 04/05/2021   Type 2 diabetes mellitus with hyperglycemia, with long-term current use of insulin (HCC) 08/20/2020   Type 2 diabetes mellitus with microalbuminuria, with long-term current use of insulin (HCC) 10/14/2019   Type 2 diabetes mellitus with diabetic polyneuropathy, with long-term current use of insulin (HCC) 04/15/2019   Hypertriglyceridemia 04/15/2019   Spinal stenosis of lumbar region with neurogenic claudication 03/07/2019   Lumbar radiculopathy 03/07/2019   Chronic bilateral low back pain with bilateral sciatica 03/07/2019   CAD (coronary artery disease), native coronary artery, minimal by cardiac cath 01/08/19 01/09/2019   Syncope and collapse    Spondylosis without myelopathy or radiculopathy, lumbar region 04/26/2018   Cocaine abuse (HCC) 12/19/2016   Shingles outbreak 12/13/2016   Diabetes type 2, uncontrolled 04/19/2016   Metatarsalgia 10/19/2015   Back muscle spasm 11/18/2013   Hyperlipidemia 11/18/2013   HTN (hypertension) 11/22/2012   Chronic pain syndrome 11/22/2012   Tobacco use disorder 11/22/2012   Dental caries 11/22/2012    Past Surgical History:  Procedure Laterality Date   CIRCUMCISION N/A 03/16/2018   Procedure: CIRCUMCISION ADULT WITH PENILE BLOCK;  Surgeon: Sebastian Ache, MD;  Location: [facility];  Service: Urology;  Laterality: N/A;  45 MINS   KNEE  ARTHROSCOPY Bilateral    LEFT HEART CATH AND CORONARY ANGIOGRAPHY N/A 01/08/2019   Procedure: LEFT HEART CATH AND CORONARY ANGIOGRAPHY;  Surgeon: Lennette Bihari, MD;  Location: [facility];  Service: Cardiovascular;  Laterality: N/A;   LYMPH NODE DISSECTION Left 05/13/2022   Procedure: RETROPERITONEAL LYMPH NODE DISSECTIONLYMPH NODE DISSECTION;  Surgeon: Sebastian Ache, MD;  Location: [facility];  Service: Urology;  Laterality: Left;   ROBOT ASSISTED LAPAROSCOPIC NEPHRECTOMY  Left 05/13/2022   Procedure: XI ROBOTIC ASSISTED LAPAROSCOPIC NEPHRECTOMY;  Surgeon: Sebastian Ache, MD;  Location: [facility];  Service: Urology;  Laterality: Left;  3 HRS       Home Medications    Prior to Admission medications   Medication Sig Start Date End Date Taking? Authorizing Provider  acetaminophen (TYLENOL) 650 MG CR tablet Take 2-3 tablets by mouth daily as needed for pain.   Yes [provider]  albuterol (VENTOLIN HFA) 108 (90 Base) MCG/ACT inhaler Inhale 2 puffs into the lungs every 6 (six) hours as needed for wheezing or shortness of breath. 03/06/23  Yes Amyot, Ali Lowe, NP  cephALEXin (KEFLEX) 500 MG capsule Take 1 capsule (500 mg total) by mouth 3 (three) times daily. 09/13/23  Yes Hollee Fate K, PA-C  Continuous Glucose Sensor (DEXCOM G7 SENSOR) MISC 1 Device by Does not apply route as directed. 08/03/23  Yes Shamleffer, Konrad Dolores, MD  dapagliflozin propanediol (FARXIGA) 10 MG TABS tablet Take 1 tablet (10 mg total) by mouth daily before breakfast. 08/01/23  Yes Shamleffer, Konrad Dolores, MD  diclofenac (VOLTAREN) 50 MG EC tablet TAKE 1 TABLET(50 MG) BY MOUTH TWICE DAILY WITH FOOD AS NEEDED 07/25/22  Yes Eldred Manges, MD  glucose blood (ACCU-CHEK GUIDE TEST) test strip USE THREE TIMES DAILY AS DIRECTED 07/06/23  Yes Shamleffer, Konrad Dolores, MD  HYDROcodone-acetaminophen (NORCO/VICODIN) 5-325 MG tablet Take 0.5-1 tablets by mouth 2 (two) times daily as needed for up to 3 days. 09/13/23 09/16/23 Yes Kaydee Magel K, PA-C  Insulin Disposable Pump (OMNIPOD 5 G7 INTRO, GEN 5,) KIT 1 Device by Does not apply route every other day. 08/03/23  Yes Shamleffer, Konrad Dolores, MD  Insulin Disposable Pump (OMNIPOD 5 G7 PODS, GEN 5,) MISC 1 Device by Does not apply route every other day. 08/03/23  Yes Shamleffer, Konrad Dolores, MD  Insulin Lispro Prot & Lispro (HUMALOG MIX 75/25 KWIKPEN) (75-25) 100 UNIT/ML Kwikpen Inject 36 Units into the skin in the morning and at bedtime.  08/01/23  Yes Shamleffer, Konrad Dolores, MD  Insulin Pen Needle (BD PEN NEEDLE NANO 2ND GEN) 32G X 4 MM MISC Inject 1 Device into the skin in the morning and at bedtime. 08/01/23  Yes Shamleffer, Konrad Dolores, MD  losartan (COZAAR) 25 MG tablet Take 1 tablet (25 mg total) by mouth daily. 08/01/23  Yes Shamleffer, Konrad Dolores, MD  pantoprazole (PROTONIX) 40 MG tablet Take 1 tablet (40 mg total) by mouth daily. 04/20/23  Yes Hagler, Arlys John, MD  pioglitazone (ACTOS) 30 MG tablet Take 1 tablet (30 mg total) by mouth daily. 08/01/23  Yes Shamleffer, Konrad Dolores, MD  rosuvastatin (CRESTOR) 40 MG tablet Take 1 tablet (40 mg total) by mouth daily. 08/01/23  Yes Shamleffer, Konrad Dolores, MD  sulfamethoxazole-trimethoprim (BACTRIM DS) 800-160 MG tablet Take 1 tablet by mouth 2 (two) times daily for 7 days. 09/13/23 09/20/23 Yes Thanya Cegielski, Noberto Retort, PA-C    Family History Family History  Problem Relation Age of Onset   Cancer Mother    Stroke Father  Heart disease Father    Diabetes Father     Social History Social History   Tobacco Use   Smoking status: Every Day    Current packs/day: 1.00    Average packs/day: 1 pack/day for 50.0 years (50.0 ttl pk-yrs)    Types: Cigarettes   Smokeless tobacco: Former    Types: Engineer, drilling   Vaping status: Never Used  Substance Use Topics   Alcohol use: Not Currently    Comment: occ   Drug use: Not Currently    Types: Marijuana, Cocaine    Comment: twice a week     Allergies   Patient has no known allergies.   Review of Systems Review of Systems  Constitutional:  Positive for activity change. Negative for appetite change, fatigue and fever.  Respiratory:  Negative for cough and shortness of breath.   Cardiovascular:  Negative for chest pain.  Gastrointestinal:  Negative for abdominal pain, diarrhea, nausea and vomiting.  Musculoskeletal:  Positive for arthralgias. Negative for myalgias.  Skin:  Positive for color change and wound.   Neurological:  Negative for weakness and numbness.     Physical Exam Triage Vital Signs ED Triage Vitals  Encounter Vitals Group     BP 09/13/23 1246 138/78     Systolic BP Percentile --      Diastolic BP Percentile --      Pulse Rate 09/13/23 1246 (!) 56     Resp 09/13/23 1246 16     Temp 09/13/23 1246 97.7 F (36.5 C)     Temp Source 09/13/23 1246 Oral     SpO2 09/13/23 1246 95 %     Weight --      Height --      Head Circumference --      Peak Flow --      Pain Score 09/13/23 1249 7     Pain Loc --      Pain Education --      Exclude from Growth Chart --    No data found.  Updated Vital Signs BP 138/78 (BP Location: Left Arm)   Pulse (!) 56   Temp 97.7 F (36.5 C) (Oral)   Resp 16   SpO2 95%   Visual Acuity Right Eye Distance:   Left Eye Distance:   Bilateral Distance:    Right Eye Near:   Left Eye Near:    Bilateral Near:     Physical Exam Vitals reviewed.  Constitutional:      General: He is awake.     Appearance: Normal appearance. He is well-developed. He is not ill-appearing.     Comments: Very pleasant male appears stated age in no acute distress sitting comfortably in exam room  HENT:     Head: Normocephalic and atraumatic.  Cardiovascular:     Rate and Rhythm: Normal rate and regular rhythm.     Heart sounds: Normal heart sounds, S1 normal and S2 normal. No murmur heard. Pulmonary:     Effort: Pulmonary effort is normal.     Breath sounds: Normal breath sounds. No stridor. No wheezing, rhonchi or rales.     Comments: Clear to auscultation bilaterally Musculoskeletal:     Right elbow: No swelling. Normal range of motion. No tenderness.     Comments: Right elbow: No focal tenderness.  Hand is neurovascularly intact.  Normal active range of motion.  No deformity noted.  Strength 5/5 bilateral upper extremities.  Skin:    Findings: Abscess present.  Neurological:     Mental Status: He is alert.  Psychiatric:        Behavior: Behavior  is cooperative.      UC Treatments / Results  Labs (all labs ordered are listed, but only abnormal results are displayed) Labs Reviewed - No data to display  EKG   Radiology No results found.  Procedures Incision and Drainage  Date/Time: 09/13/2023 1:30 PM  Performed by: Jeani Hawking, PA-C Authorized by: Jeani Hawking, PA-C   Consent:    Consent obtained:  Verbal   Consent given by:  Patient   Risks discussed:  Bleeding, incomplete drainage, pain and damage to other organs   Alternatives discussed:  No treatment Universal protocol:    Procedure explained and questions answered to patient or proxy's satisfaction: yes     Patient identity confirmed:  Verbally with patient Location:    Type:  Abscess   Size:  5 cm x 5 cm   Location:  Trunk   Trunk location:  Back Pre-procedure details:    Skin preparation:  Chlorhexidine with alcohol Sedation:    Sedation type:  None Anesthesia:    Anesthesia method:  Local infiltration   Local anesthetic:  Lidocaine 1% WITH epi Procedure type:    Complexity:  Complex Procedure details:    Incision types:  Single straight   Incision depth:  Subcutaneous   Wound management:  Probed and deloculated, irrigated with saline and extensive cleaning   Drainage:  Purulent   Drainage amount:  Copious   Wound treatment:  Wound left open   Packing materials:  1/4 in iodoform gauze Post-procedure details:    Procedure completion:  Tolerated well, no immediate complications  (including critical care time)  Medications Ordered in UC Medications - No data to display  Initial Impression / Assessment and Plan / UC Course  I have reviewed the triage vital signs and the nursing notes.  Pertinent labs & imaging results that were available during my care of the patient were reviewed by me and considered in my medical decision making (see chart for details).     Patient is well-appearing, afebrile, nontoxic, nontachycardic.  Abscess was  successfully drained in clinic.  Given size packing was placed we discussed that he will need to return in 2 days to have the packing removed and potentially replaced.  He is agreeable and will return.  Will start cephalexin and Bactrim DS given severity of infection.  We discussed that if he develops any rash or oral lesions he is to stop the medication and be seen immediately.  Discussed the importance of follow-up.  Given ongoing pain he was given a few doses of hydrocodone.  Review of [place] controlled substance database shows no inappropriate refills with last refill June 2024.  We discussed that these medications are addictive and sedating and he should limit use is much as possible.  He was given 6 tablets and discussed that we cannot provide any refills.  We discussed that if anything worsens or changes and he has increasing pain, change in drainage, fever, nausea, vomiting he needs to be seen immediately.  Strict return precautions given.  All questions were answered to his satisfaction.  X-ray was deferred as patient denies any recent trauma and has no focal bony tenderness.  Suspect sprain as etiology of symptoms versus osteoarthritis.  He was encouraged to use RICE protocol and was placed in Ace bandage for comfort and support today.  He can use over-the-counter analgesic  such as Tylenol.  Recommended he follow-up with his primary care to consider referral to sports medicine if his symptoms are improving quickly.  If anything worsens he is to return for reevaluation.  All questions were answered to his satisfaction.  Final Clinical Impressions(s) / UC Diagnoses   Final diagnoses:  Infected cyst of skin  Abscess of back  Elbow sprain, right, initial encounter     Discharge Instructions      We drained the abscess today.  Please return in 2 days to have the packing removed and potentially replaced.  Start cephalexin 3 times daily.  Start sulfamethoxazole-trimethoprim twice daily for  7 days.  If you develop any rash or oral lesions stop the medication and be seen immediately.  I have called in a few doses of hydrocodone.  This medication is addictive and sedating and we cannot provide any refills.  Try to limit use is much as possible.  If you have any increasing pain, fever, nausea, vomiting you need to be seen immediately.  Use the Ace bandage for your pain.  Follow-up with primary care to consider referral to sports medicine if your pain is not improving with conservative treatment including elevation and compression.  If anything worsens and you have numbness or tingling in your hand you need to be seen immediately.     ED Prescriptions     Medication Sig Dispense Auth. Provider   HYDROcodone-acetaminophen (NORCO/VICODIN) 5-325 MG tablet Take 0.5-1 tablets by mouth 2 (two) times daily as needed for up to 3 days. 6 tablet Shirlee Whitmire K, PA-C   sulfamethoxazole-trimethoprim (BACTRIM DS) 800-160 MG tablet Take 1 tablet by mouth 2 (two) times daily for 7 days. 14 tablet Raven Harmes K, PA-C   cephALEXin (KEFLEX) 500 MG capsule Take 1 capsule (500 mg total) by mouth 3 (three) times daily. 21 capsule Terica Yogi K, PA-C      I have reviewed the PDMP during this encounter.   Jeani Hawking, PA-C 09/13/23 1332

## 2023-09-13 NOTE — ED Triage Notes (Signed)
 Patient reports red, painful cyst on his back x 10 years. Patient reports some pain and swelling. Denies any fever at this time.

## 2023-09-13 NOTE — Discharge Instructions (Signed)
 We drained the abscess today.  Please return in 2 days to have the packing removed and potentially replaced.  Start cephalexin 3 times daily.  Start sulfamethoxazole-trimethoprim twice daily for 7 days.  If you develop any rash or oral lesions stop the medication and be seen immediately.  I have called in a few doses of hydrocodone.  This medication is addictive and sedating and we cannot provide any refills.  Try to limit use is much as possible.  If you have any increasing pain, fever, nausea, vomiting you need to be seen immediately.  Use the Ace bandage for your pain.  Follow-up with primary care to consider referral to sports medicine if your pain is not improving with conservative treatment including elevation and compression.  If anything worsens and you have numbness or tingling in your hand you need to be seen immediately.

## 2023-09-18 ENCOUNTER — Ambulatory Visit (HOSPITAL_COMMUNITY)
Admission: RE | Admit: 2023-09-18 | Discharge: 2023-09-18 | Disposition: A | Discharge status: Home / Self Care | Source: Ambulatory Visit | Attending: Internal Medicine | Admitting: Internal Medicine

## 2023-09-18 ENCOUNTER — Encounter (HOSPITAL_COMMUNITY): Payer: Self-pay

## 2023-09-18 VITALS — BP 124/69 | HR 66 | Temp 97.4°F | Resp 18

## 2023-09-18 DIAGNOSIS — Z794 Long term (current) use of insulin: Secondary | ICD-10-CM | POA: Diagnosis not present

## 2023-09-18 DIAGNOSIS — E11628 Type 2 diabetes mellitus with other skin complications: Secondary | ICD-10-CM

## 2023-09-18 DIAGNOSIS — L02212 Cutaneous abscess of back [any part, except buttock]: Secondary | ICD-10-CM | POA: Diagnosis not present

## 2023-09-18 DIAGNOSIS — Z5189 Encounter for other specified aftercare: Secondary | ICD-10-CM | POA: Diagnosis not present

## 2023-09-18 MED ORDER — LIDOCAINE-EPINEPHRINE 1 %-1:100000 IJ SOLN
INTRAMUSCULAR | Status: AC
  Filled 2023-09-18: qty 1

## 2023-09-18 MED ORDER — SULFAMETHOXAZOLE-TRIMETHOPRIM 800-160 MG PO TABS: 1.0000 | ORAL_TABLET | Freq: Two times a day (BID) | ORAL | 0 refills | Status: DC

## 2023-09-18 MED ORDER — HYDROCODONE-ACETAMINOPHEN 5-325 MG PO TABS: 1.0000 | ORAL_TABLET | Freq: Four times a day (QID) | ORAL | 0 refills | Status: DC | PRN

## 2023-09-18 NOTE — ED Provider Notes (Signed)
 [facility]    CSN: [id number] Arrival date & time: 09/18/23  4098      History   Chief Complaint Chief Complaint  Patient presents with   Abscess    HPI Ryan Farmer is a 68 y.o. male.   Patient presents to urgent care for reevaluation of infected cyst to the left upper back that has been present for the last 10 years and became inflamed/more tender a few weeks ago.  He had this abscess drained on September 13, 2023 in urgent care and was placed on Bactrim antibiotic.  He has been taking Bactrim as prescribed every 12 hours for the last 5 days and states that the abscess has become less red and swollen, however the abscess is still draining thick purulent fluid and pain has increased again.  Abscess was packed at time of initial drainage 5 days ago and he is here for a wound check to have the packing removed.  History of type 2 diabetes on insulin, denies other recent antibiotic or steroid use.  Has been taking Tylenol and hydrocodone-acetaminophen prescribed by previous provider urgent care for pain with some relief.  States he is not sleeping due to the pain.  Denies recent fevers, chills, body aches, and dizziness.   Abscess   Past Medical History:  Diagnosis Date   Arthritis    All over   CAD (coronary artery disease), native coronary artery, minimal by cardiac cath 01/08/19 01/09/2019   Chronic pain syndrome    Diabetes mellitus    History of cocaine use    History of kidney stones    Hypercholesteremia    Hypertension    Low back pain    Lumbar radiculopathy    Right leg pain    Seborrheic dermatitis of scalp    Shingles     Patient Active Problem List   Diagnosis Date Noted   Long term current use of oral hypoglycemic drug 03/16/2023   Dyslipidemia 03/16/2023   Type 2 diabetes mellitus with stage 3b chronic kidney disease, with long-term current use of insulin (HCC) 03/16/2023   Renal mass 05/13/2022   Spinal stenosis of lumbosacral region 04/21/2021    Spondylolisthesis, lumbar region    Palmar wart 04/05/2021   Acute non-recurrent maxillary sinusitis 04/05/2021   Type 2 diabetes mellitus with hyperglycemia, with long-term current use of insulin (HCC) 08/20/2020   Type 2 diabetes mellitus with microalbuminuria, with long-term current use of insulin (HCC) 10/14/2019   Type 2 diabetes mellitus with diabetic polyneuropathy, with long-term current use of insulin (HCC) 04/15/2019   Hypertriglyceridemia 04/15/2019   Spinal stenosis of lumbar region with neurogenic claudication 03/07/2019   Lumbar radiculopathy 03/07/2019   Chronic bilateral low back pain with bilateral sciatica 03/07/2019   CAD (coronary artery disease), native coronary artery, minimal by cardiac cath 01/08/19 01/09/2019   Syncope and collapse    Spondylosis without myelopathy or radiculopathy, lumbar region 04/26/2018   Cocaine abuse (HCC) 12/19/2016   Shingles outbreak 12/13/2016   Diabetes type 2, uncontrolled 04/19/2016   Metatarsalgia 10/19/2015   Back muscle spasm 11/18/2013   Hyperlipidemia 11/18/2013   HTN (hypertension) 11/22/2012   Chronic pain syndrome 11/22/2012   Tobacco use disorder 11/22/2012   Dental caries 11/22/2012    Past Surgical History:  Procedure Laterality Date   CIRCUMCISION N/A 03/16/2018   Procedure: CIRCUMCISION ADULT WITH PENILE BLOCK;  Surgeon: Sebastian Ache, MD;  Location: [facility];  Service: Urology;  Laterality: N/A;  45 MINS  KNEE ARTHROSCOPY Bilateral    LEFT HEART CATH AND CORONARY ANGIOGRAPHY N/A 01/08/2019   Procedure: LEFT HEART CATH AND CORONARY ANGIOGRAPHY;  Surgeon: Lennette Bihari, MD;  Location: [facility];  Service: Cardiovascular;  Laterality: N/A;   LYMPH NODE DISSECTION Left 05/13/2022   Procedure: RETROPERITONEAL LYMPH NODE DISSECTIONLYMPH NODE DISSECTION;  Surgeon: Sebastian Ache, MD;  Location: [facility];  Service: Urology;  Laterality: Left;   ROBOT ASSISTED LAPAROSCOPIC NEPHRECTOMY Left  05/13/2022   Procedure: XI ROBOTIC ASSISTED LAPAROSCOPIC NEPHRECTOMY;  Surgeon: Sebastian Ache, MD;  Location: [facility];  Service: Urology;  Laterality: Left;  3 HRS       Home Medications    Prior to Admission medications   Medication Sig Start Date End Date Taking? Authorizing Provider  HYDROcodone-acetaminophen (NORCO/VICODIN) 5-325 MG tablet Take 1 tablet by mouth every 6 (six) hours as needed. 09/18/23  Yes Carlisle Beers, FNP  sulfamethoxazole-trimethoprim (BACTRIM DS) 800-160 MG tablet Take 1 tablet by mouth 2 (two) times daily for 3 days. 09/18/23 09/21/23 Yes StanhopeDonavan Burnet, FNP  acetaminophen (TYLENOL) 650 MG CR tablet Take 2-3 tablets by mouth daily as needed for pain.    [provider]  albuterol (VENTOLIN HFA) 108 (90 Base) MCG/ACT inhaler Inhale 2 puffs into the lungs every 6 (six) hours as needed for wheezing or shortness of breath. 03/06/23   Amyot, Ali Lowe, NP  Continuous Glucose Sensor (DEXCOM G7 SENSOR) MISC 1 Device by Does not apply route as directed. 08/03/23   Shamleffer, Konrad Dolores, MD  dapagliflozin propanediol (FARXIGA) 10 MG TABS tablet Take 1 tablet (10 mg total) by mouth daily before breakfast. 08/01/23   Shamleffer, Konrad Dolores, MD  diclofenac (VOLTAREN) 50 MG EC tablet TAKE 1 TABLET(50 MG) BY MOUTH TWICE DAILY WITH FOOD AS NEEDED 07/25/22   Eldred Manges, MD  glucose blood (ACCU-CHEK GUIDE TEST) test strip USE THREE TIMES DAILY AS DIRECTED 07/06/23   Shamleffer, Konrad Dolores, MD  Insulin Disposable Pump (OMNIPOD 5 G7 INTRO, GEN 5,) KIT 1 Device by Does not apply route every other day. 08/03/23   Shamleffer, Konrad Dolores, MD  Insulin Disposable Pump (OMNIPOD 5 G7 PODS, GEN 5,) MISC 1 Device by Does not apply route every other day. 08/03/23   Shamleffer, Konrad Dolores, MD  Insulin Lispro Prot & Lispro (HUMALOG MIX 75/25 KWIKPEN) (75-25) 100 UNIT/ML Kwikpen Inject 36 Units into the skin in the morning and at bedtime. 08/01/23    Shamleffer, Konrad Dolores, MD  Insulin Pen Needle (BD PEN NEEDLE NANO 2ND GEN) 32G X 4 MM MISC Inject 1 Device into the skin in the morning and at bedtime. 08/01/23   Shamleffer, Konrad Dolores, MD  losartan (COZAAR) 25 MG tablet Take 1 tablet (25 mg total) by mouth daily. 08/01/23   Shamleffer, Konrad Dolores, MD  pantoprazole (PROTONIX) 40 MG tablet Take 1 tablet (40 mg total) by mouth daily. 04/20/23   Mardella Layman, MD  pioglitazone (ACTOS) 30 MG tablet Take 1 tablet (30 mg total) by mouth daily. 08/01/23   Shamleffer, Konrad Dolores, MD  rosuvastatin (CRESTOR) 40 MG tablet Take 1 tablet (40 mg total) by mouth daily. 08/01/23   Shamleffer, Konrad Dolores, MD  sulfamethoxazole-trimethoprim (BACTRIM DS) 800-160 MG tablet Take 1 tablet by mouth 2 (two) times daily for 7 days. 09/13/23 09/20/23  Raspet, Noberto Retort, PA-C    Family History Family History  Problem Relation Age of Onset   Cancer Mother    Stroke Father    Heart  disease Father    Diabetes Father     Social History Social History   Tobacco Use   Smoking status: Every Day    Current packs/day: 1.00    Average packs/day: 1 pack/day for 50.0 years (50.0 ttl pk-yrs)    Types: Cigarettes   Smokeless tobacco: Former    Types: Engineer, drilling   Vaping status: Never Used  Substance Use Topics   Alcohol use: Not Currently    Comment: occ   Drug use: Not Currently    Types: Marijuana, Cocaine    Comment: twice a week     Allergies   Patient has no known allergies.   Review of Systems Review of Systems Per HPI  Physical Exam Triage Vital Signs ED Triage Vitals  Encounter Vitals Group     BP 09/18/23 0838 124/69     Systolic BP Percentile --      Diastolic BP Percentile --      Pulse Rate 09/18/23 0838 66     Resp 09/18/23 0838 18     Temp 09/18/23 0838 (!) 97.4 F (36.3 C)     Temp Source 09/18/23 0838 Oral     SpO2 09/18/23 0838 94 %     Weight --      Height --      Head Circumference --      Peak Flow --       Pain Score 09/18/23 0839 0     Pain Loc --      Pain Education --      Exclude from Growth Chart --    No data found.  Updated Vital Signs BP 124/69 (BP Location: Right Arm)   Pulse 66   Temp (!) 97.4 F (36.3 C) (Oral)   Resp 18   SpO2 94%   Visual Acuity Right Eye Distance:   Left Eye Distance:   Bilateral Distance:    Right Eye Near:   Left Eye Near:    Bilateral Near:     Physical Exam Vitals and nursing note reviewed.  Constitutional:      Appearance: He is not ill-appearing or toxic-appearing.  HENT:     Head: Normocephalic and atraumatic.     Right Ear: Hearing and external ear normal.     Left Ear: Hearing and external ear normal.     Nose: Nose normal.     Mouth/Throat:     Lips: Pink.  Eyes:     General: Lids are normal. Vision grossly intact. Gaze aligned appropriately.     Extraocular Movements: Extraocular movements intact.     Conjunctiva/sclera: Conjunctivae normal.  Pulmonary:     Effort: Pulmonary effort is normal.  Musculoskeletal:     Cervical back: Neck supple.  Skin:    General: Skin is warm and dry.     Capillary Refill: Capillary refill takes less than 2 seconds.     Findings: Abscess present. No rash.       Neurological:     General: No focal deficit present.     Mental Status: He is alert and oriented to person, place, and time. Mental status is at baseline.     Cranial Nerves: No dysarthria or facial asymmetry.  Psychiatric:        Mood and Affect: Mood normal.        Speech: Speech normal.        Behavior: Behavior normal.        Thought Content: Thought content normal.  Judgment: Judgment normal.      UC Treatments / Results  Labs (all labs ordered are listed, but only abnormal results are displayed) Labs Reviewed - No data to display  EKG   Radiology No results found.  Procedures Incision and Drainage  Date/Time: 09/18/2023 10:05 AM  Performed by: Carlisle Beers, FNP Authorized by: Carlisle Beers, FNP   Consent:    Consent obtained:  Verbal   Consent given by:  Patient   Risks discussed:  Bleeding, incomplete drainage, pain and damage to other organs   Alternatives discussed:  No treatment Universal protocol:    Procedure explained and questions answered to patient or proxy's satisfaction: yes     Relevant documents present and verified: yes     Test results available : yes     Imaging studies available: yes     Required blood products, implants, devices, and special equipment available: yes     Site/side marked: yes     Immediately prior to procedure, a time out was called: yes     Patient identity confirmed:  Verbally with patient Location:    Type:  Abscess   Size:  5cm by 5cm   Location:  Trunk   Trunk location:  Back (Left upper back) Pre-procedure details:    Skin preparation:  Betadine Sedation:    Sedation type:  None Anesthesia:    Anesthesia method:  Local infiltration   Local anesthetic:  Lidocaine 1% WITH epi Procedure type:    Complexity:  Complex Procedure details:    Incision types:  Single straight   Incision depth:  Subcutaneous   Wound management:  Probed and deloculated, irrigated with saline and extensive cleaning   Drainage:  Purulent and bloody   Drainage amount:  Scant   Packing materials:  1/2 in gauze Post-procedure details:    Procedure completion:  Tolerated well, no immediate complications  (including critical care time)  Medications Ordered in UC Medications - No data to display  Initial Impression / Assessment and Plan / UC Course  I have reviewed the triage vital signs and the nursing notes.  Pertinent labs & imaging results that were available during my care of the patient were reviewed by me and considered in my medical decision making (see chart for details).   1.  Abscess of back, encounter for wound recheck See incision and drainage note above for further detail regarding incision and drainage procedure, patient  tolerated well.   Packing had fallen out prior to wound recheck. Packing replaced after incision and drainage today. Wound cleansed and dressed in clinic with nonstick gauze and tape.  He was prescribed 7 days of Bactrim, I would like to increase this to a 10-day course of Bactrim.  Advised to take this as prescribed every 12 hours.  Advised to return to urgent care for wound recheck in 3 days for packing removal.  No signs of systemic infection today, therefore deferred referral to the emergency room for blood work. ER return precautions, wound care, and follow-up precautions have been discussed.  PDMP reviewed, patient may have 4 more pills of hydrocodone-acetaminophen for pain management at home in the setting of acute pain due to the abscess. Tylenol 650 every 6 hours as needed for mild to moderate pain, hydrocodone just acetaminophen every 6 hours as needed for severe pain.  Drowsiness precautions discussed.  Counseled patient on potential for adverse effects with medications prescribed/recommended today, strict ER and return-to-clinic precautions discussed, patient verbalized understanding.  Final Clinical Impressions(s) / UC Diagnoses   Final diagnoses:  Abscess of back  Encounter for wound re-check     Discharge Instructions      We drained your abscess today in the clinic and left open to continue to drain with packing. Use warm compresses to the surrounding areas of the wound.   Pick up 6 more pills of Bactrim antibiotic to extend your antibiotic dose for 5 more days to make 10 days total.  Take Tylenol as needed for pain.  If your pain is not well-controlled by Tylenol 650 mg every 6 hours as needed, you may take 1 pill of the hydrocodone pain medicine every 6 hours as needed for severe pain.  Do not drive or drink alcohol when taking hydrocodone acetaminophen.  Change your dressings frequently as the wound heals.   If you notice any worsening signs of infection such  as redness, swelling, fever, or worsening drainage, please return to urgent care for reevaluation.      ED Prescriptions     Medication Sig Dispense Auth. Provider   sulfamethoxazole-trimethoprim (BACTRIM DS) 800-160 MG tablet Take 1 tablet by mouth 2 (two) times daily for 3 days. 6 tablet Carlisle Beers, FNP   HYDROcodone-acetaminophen (NORCO/VICODIN) 5-325 MG tablet Take 1 tablet by mouth every 6 (six) hours as needed. 4 tablet Carlisle Beers, FNP      I have reviewed the PDMP during this encounter.   Carlisle Beers, [place] 09/18/23 1009

## 2023-09-18 NOTE — ED Triage Notes (Signed)
 Pt here for wound check. States had a abscess drained and packed to upper back last week and told to return.

## 2023-09-18 NOTE — Discharge Instructions (Addendum)
 We drained your abscess today in the clinic and left open to continue to drain with packing. Use warm compresses to the surrounding areas of the wound.   Pick up 6 more pills of Bactrim antibiotic to extend your antibiotic dose for 5 more days to make 10 days total.  Take Tylenol as needed for pain.  If your pain is not well-controlled by Tylenol 650 mg every 6 hours as needed, you may take 1 pill of the hydrocodone pain medicine every 6 hours as needed for severe pain.  Do not drive or drink alcohol when taking hydrocodone acetaminophen.  Change your dressings frequently as the wound heals.   If you notice any worsening signs of infection such as redness, swelling, fever, or worsening drainage, please return to urgent care for reevaluation.

## 2023-09-21 ENCOUNTER — Encounter (HOSPITAL_COMMUNITY): Payer: Self-pay

## 2023-09-21 ENCOUNTER — Ambulatory Visit (HOSPITAL_COMMUNITY)
Admission: RE | Admit: 2023-09-21 | Discharge: 2023-09-21 | Disposition: A | Discharge status: Home / Self Care | Source: Ambulatory Visit | Attending: Family Medicine | Admitting: Family Medicine

## 2023-09-21 VITALS — BP 132/75 | HR 68 | Temp 97.8°F | Resp 18 | Ht 72.0 in | Wt 220.0 lb

## 2023-09-21 DIAGNOSIS — Z5189 Encounter for other specified aftercare: Secondary | ICD-10-CM | POA: Diagnosis not present

## 2023-09-21 DIAGNOSIS — M7711 Lateral epicondylitis, right elbow: Secondary | ICD-10-CM

## 2023-09-21 NOTE — Discharge Instructions (Signed)
 We repacked your abscess today in the clinic and left open to continue to drain with packing. Use warm compresses to the surrounding areas of the wound.   Return on Monday for reevaluation of your abscess    Take Tylenol 650 mg every 6 hours as needed.  Do not drive or drink alcohol when taking hydrocodone acetaminophen.   Change your dressings twice a day as the wound heals.    If you notice any worsening signs of infection such as redness, swelling, fever, or worsening drainage, please return to urgent care for reevaluation.  You have lateral epicondylitis(tennis elbow) of the right elbow. When you are doing activities you can wear an Ace bandage over the area to spread out tension and help with pain Do the printed exercises every day

## 2023-09-21 NOTE — ED Triage Notes (Signed)
 Patient presenting with a packed wound on the back onset 09/13/2023. Patient here for a follow up on the wound to have the packing removed. Also having pain in the right elbow onset 1-2 weeks ago.  No known falls or injuries.  Prescriptions or OTC medications tried: Yes- tylenol     with little relief

## 2023-09-21 NOTE — ED Provider Notes (Addendum)
 [facility]    CSN: [id number] Arrival date & time: 09/21/23  0820      History   Chief Complaint Chief Complaint  Patient presents with   Wound Check   Elbow Pain   Appointment    HPI Ryan Farmer is a 68 y.o. male.   The patient is following up for repacking of his left-sided back abscess.  His sister has been changing his dressings for him twice a day.  He reports the area of swelling and redness has decreased and there has not been any substantial drainage.  He has not had any fevers, chills, nausea, vomiting, chest pain, shortness of breath. He has also had some chronic pain over the right lateral elbow.  He does not do any repetitive activities that he knows of but the pain is worsened with activity involving the wrist.  There has not been any swelling, warmth, lesions, or bruising.  He denies any numbness or weakness at the wrist or hand  The history is provided by the patient.  Wound Check Pertinent negatives include no chest pain and no shortness of breath.    Past Medical History:  Diagnosis Date   Arthritis    All over   CAD (coronary artery disease), native coronary artery, minimal by cardiac cath 01/08/19 01/09/2019   Chronic pain syndrome    Diabetes mellitus    History of cocaine use    History of kidney stones    Hypercholesteremia    Hypertension    Low back pain    Lumbar radiculopathy    Right leg pain    Seborrheic dermatitis of scalp    Shingles     Patient Active Problem List   Diagnosis Date Noted   Long term current use of oral hypoglycemic drug 03/16/2023   Dyslipidemia 03/16/2023   Type 2 diabetes mellitus with stage 3b chronic kidney disease, with long-term current use of insulin (HCC) 03/16/2023   Renal mass 05/13/2022   Spinal stenosis of lumbosacral region 04/21/2021   Spondylolisthesis, lumbar region    Palmar wart 04/05/2021   Acute non-recurrent maxillary sinusitis 04/05/2021   Type 2 diabetes mellitus with  hyperglycemia, with long-term current use of insulin (HCC) 08/20/2020   Type 2 diabetes mellitus with microalbuminuria, with long-term current use of insulin (HCC) 10/14/2019   Type 2 diabetes mellitus with diabetic polyneuropathy, with long-term current use of insulin (HCC) 04/15/2019   Hypertriglyceridemia 04/15/2019   Spinal stenosis of lumbar region with neurogenic claudication 03/07/2019   Lumbar radiculopathy 03/07/2019   Chronic bilateral low back pain with bilateral sciatica 03/07/2019   CAD (coronary artery disease), native coronary artery, minimal by cardiac cath 01/08/19 01/09/2019   Syncope and collapse    Spondylosis without myelopathy or radiculopathy, lumbar region 04/26/2018   Cocaine abuse (HCC) 12/19/2016   Shingles outbreak 12/13/2016   Diabetes type 2, uncontrolled 04/19/2016   Metatarsalgia 10/19/2015   Back muscle spasm 11/18/2013   Hyperlipidemia 11/18/2013   HTN (hypertension) 11/22/2012   Chronic pain syndrome 11/22/2012   Tobacco use disorder 11/22/2012   Dental caries 11/22/2012    Past Surgical History:  Procedure Laterality Date   CIRCUMCISION N/A 03/16/2018   Procedure: CIRCUMCISION ADULT WITH PENILE BLOCK;  Surgeon: Sebastian Ache, MD;  Location: [facility];  Service: Urology;  Laterality: N/A;  45 MINS   KNEE ARTHROSCOPY Bilateral    LEFT HEART CATH AND CORONARY ANGIOGRAPHY N/A 01/08/2019   Procedure: LEFT HEART CATH AND CORONARY ANGIOGRAPHY;  Surgeon: Lennette Bihari, MD;  Location: [facility] INVASIVE CV LAB;  Service: Cardiovascular;  Laterality: N/A;   LYMPH NODE DISSECTION Left 05/13/2022   Procedure: RETROPERITONEAL LYMPH NODE DISSECTIONLYMPH NODE DISSECTION;  Surgeon: Sebastian Ache, MD;  Location: [facility];  Service: Urology;  Laterality: Left;   ROBOT ASSISTED LAPAROSCOPIC NEPHRECTOMY Left 05/13/2022   Procedure: XI ROBOTIC ASSISTED LAPAROSCOPIC NEPHRECTOMY;  Surgeon: Sebastian Ache, MD;  Location: [facility];  Service: Urology;  Laterality:  Left;  3 HRS       Home Medications    Prior to Admission medications   Medication Sig Start Date End Date Taking? Authorizing Provider  acetaminophen (TYLENOL) 650 MG CR tablet Take 2-3 tablets by mouth daily as needed for pain.   Yes [provider]  Insulin Lispro Prot & Lispro (HUMALOG MIX 75/25 KWIKPEN) (75-25) 100 UNIT/ML Kwikpen Inject 36 Units into the skin in the morning and at bedtime. 08/01/23  Yes Shamleffer, Konrad Dolores, MD  Insulin Pen Needle (BD PEN NEEDLE NANO 2ND GEN) 32G X 4 MM MISC Inject 1 Device into the skin in the morning and at bedtime. 08/01/23  Yes Shamleffer, Konrad Dolores, MD  rosuvastatin (CRESTOR) 40 MG tablet Take 1 tablet (40 mg total) by mouth daily. 08/01/23  Yes Shamleffer, Konrad Dolores, MD  Continuous Glucose Sensor (DEXCOM G7 SENSOR) MISC 1 Device by Does not apply route as directed. 08/03/23   Shamleffer, Konrad Dolores, MD  glucose blood (ACCU-CHEK GUIDE TEST) test strip USE THREE TIMES DAILY AS DIRECTED 07/06/23   Shamleffer, Konrad Dolores, MD  Insulin Disposable Pump (OMNIPOD 5 G7 PODS, GEN 5,) MISC 1 Device by Does not apply route every other day. 08/03/23   Shamleffer, Konrad Dolores, MD  losartan (COZAAR) 25 MG tablet Take 1 tablet (25 mg total) by mouth daily. 08/01/23   Shamleffer, Konrad Dolores, MD  pioglitazone (ACTOS) 30 MG tablet Take 1 tablet (30 mg total) by mouth daily. 08/01/23   Shamleffer, Konrad Dolores, MD    Family History Family History  Problem Relation Age of Onset   Cancer Mother    Stroke Father    Heart disease Father    Diabetes Father     Social History Social History   Tobacco Use   Smoking status: Every Day    Current packs/day: 1.00    Average packs/day: 1 pack/day for 50.0 years (50.0 ttl pk-yrs)    Types: Cigarettes   Smokeless tobacco: Former    Types: Engineer, drilling   Vaping status: Never Used  Substance Use Topics   Alcohol use: Not Currently    Comment: occ   Drug use: Not  Currently    Types: Marijuana, Cocaine    Comment: twice a week     Allergies   Patient has no known allergies.   Review of Systems Review of Systems  Constitutional:  Negative for chills, diaphoresis, fatigue and fever.  Respiratory:  Negative for shortness of breath.   Cardiovascular:  Negative for chest pain and palpitations.  Gastrointestinal:  Negative for nausea and vomiting.  Musculoskeletal:  Negative for back pain and myalgias.       Pain over the lateral right proximal elbow intermittent over the last several months He denies any decreased range of motion, numbness, or weakness distally.  Skin:  Positive for wound. Negative for color change and pallor.  Neurological:  Negative for dizziness.  Psychiatric/Behavioral:  Negative for confusion.      Physical Exam Triage Vital Signs ED Triage Vitals  Encounter Vitals Group     BP 09/21/23 0924 132/75     Systolic BP Percentile --      Diastolic BP Percentile --      Pulse Rate 09/21/23 0924 68     Resp 09/21/23 0924 18     Temp 09/21/23 0924 97.8 F (36.6 C)     Temp Source 09/21/23 0924 Oral     SpO2 09/21/23 0924 95 %     Weight 09/21/23 0923 220 lb (99.8 kg)     Height 09/21/23 0923 6' (1.829 m)     Head Circumference --      Peak Flow --      Pain Score 09/21/23 0921 10     Pain Loc --      Pain Education --      Exclude from Growth Chart --    No data found.  Updated Vital Signs BP 132/75 (BP Location: Right Arm)   Pulse 68   Temp 97.8 F (36.6 C) (Oral)   Resp 18   Ht 6' (1.829 m)   Wt 99.8 kg   SpO2 95%   BMI 29.84 kg/m   Visual Acuity Right Eye Distance:   Left Eye Distance:   Bilateral Distance:    Right Eye Near:   Left Eye Near:    Bilateral Near:     Physical Exam Vitals reviewed.  Constitutional:      General: He is not in acute distress.    Appearance: Normal appearance. He is not ill-appearing, toxic-appearing or diaphoretic.  HENT:     Head: Normocephalic and atraumatic.   Pulmonary:     Effort: Pulmonary effort is normal.  Musculoskeletal:     Comments: Right elbow inspection shows no overlying ecchymosis, edema, erythema Range of motion is full in all directions Strength 5/5 There is tenderness to palpation over the proximal insertion of the extensors over the lateral epicondyle Resisted wrist extension elicits pain at that same point  Skin:         Comments: 3 cm in diameter area of erythema with central, surgical incision and wound packing in place Mild induration of the tissue surrounding the incision but no fluctuation or frank purulent discharge Removal of packing reveals healthy tissue underlying and no further purulence expressed  Neurological:     General: No focal deficit present.     Mental Status: He is alert.     Sensory: No sensory deficit.     Motor: No weakness.      UC Treatments / Results  Labs (all labs ordered are listed, but only abnormal results are displayed) Labs Reviewed - No data to display  EKG   Radiology No results found.  Procedures Wound Care  Date/Time: 09/21/2023 10:20 AM  Performed by: Ivor Messier, MD Authorized by: Ivor Messier, MD   Consent:    Consent obtained:  Verbal   Consent given by:  Patient   Risks, benefits, and alternatives were discussed: yes     Risks discussed:  Bleeding, infection, pain and incomplete drainage   Alternatives discussed:  No treatment, delayed treatment, alternative treatment and observation Universal protocol:    Immediately prior to procedure, a time out was called: yes     Patient identity confirmed:  Verbally with patient Pre-procedure details:    Preparation: Patient was prepped and draped in usual sterile fashion   Sedation:    Sedation type:  None Anesthesia:    Anesthesia method:  None Procedure details:  Indications comment:  Wound abscess   Wound location:  Trunk   Trunk location:  Lower back   Wound age (days):  8   Wound surface  area (sq cm):  3   Debridement performed: No   Skin layer closed with:    Wound care performed:  Nothing Dressing:    Packing/drain action: new packing     Packing material:  Iodoform 1/2 inch   Dressing applied:  Telfa pad   Wrapped with:  Elastic bandage 2 inch Post-procedure details:    Procedure completion:  Tolerated well, no immediate complications  (including critical care time)  Medications Ordered in UC Medications - No data to display  Initial Impression / Assessment and Plan / UC Course  I have reviewed the triage vital signs and the nursing notes.  Pertinent labs & imaging results that were available during my care of the patient were reviewed by me and considered in my medical decision making (see chart for details).     Abscess of back, encounter for wound recheck, improved -The patient was evaluated and repacked showing some further improvement.  See procedure note - There is no longer any frank purulent discharge or underlying fluctuance - I further discussed wound care with dressing changes, and monitoring for further progression of the infection - The patient will follow-up in 4 days for repacking - The patient voiced understanding and agreement to plan  Lateral epicondylitis of the right elbow - I discussed the patient's symptoms and etiology - 2 inch Ace wrap was placed for a compression strap over the lateral epicondyle which relieved the patient's symptoms - We discussed using this when active but not when at rest - Printed material for stretches and strengthening was given for lateral epicondylitis  Final Clinical Impressions(s) / UC Diagnoses   Final diagnoses:  Abscess re-check  Lateral epicondylitis of right elbow     Discharge Instructions      We repacked your abscess today in the clinic and left open to continue to drain with packing. Use warm compresses to the surrounding areas of the wound.   Return on Monday for reevaluation of your  abscess    Take Tylenol 650 mg every 6 hours as needed.  Do not drive or drink alcohol when taking hydrocodone acetaminophen.   Change your dressings twice a day as the wound heals.    If you notice any worsening signs of infection such as redness, swelling, fever, or worsening drainage, please return to urgent care for reevaluation.  You have lateral epicondylitis(tennis elbow) of the right elbow. When you are doing activities you can wear an Ace bandage over the area to spread out tension and help with pain Do the printed exercises every day     ED Prescriptions   None    PDMP not reviewed this encounter.   Ivor Messier, MD 09/21/23 1024    Ivor Messier, MD 09/21/23 1343

## 2023-09-25 ENCOUNTER — Ambulatory Visit (HOSPITAL_COMMUNITY)
Admission: RE | Admit: 2023-09-25 | Discharge: 2023-09-25 | Disposition: A | Discharge status: Home / Self Care | Source: Ambulatory Visit

## 2023-09-25 ENCOUNTER — Encounter (HOSPITAL_COMMUNITY): Payer: Self-pay

## 2023-09-25 VITALS — BP 125/78 | HR 71 | Temp 97.7°F | Resp 18

## 2023-09-25 DIAGNOSIS — Z5189 Encounter for other specified aftercare: Secondary | ICD-10-CM

## 2023-09-25 NOTE — Discharge Instructions (Signed)
 Come back in 4 days for another wound check

## 2023-09-25 NOTE — ED Triage Notes (Signed)
 Pt states he is here to have a wound check and packing removed today. Wound is on pts back.

## 2023-09-25 NOTE — ED Provider Notes (Signed)
 [facility]    CSN: [id number] Arrival date & time: 09/25/23  7829      History   Chief Complaint Chief Complaint  Patient presents with   Wound Check    HPI Ryan Farmer is a 68 y.o. male who presents for wound check and pack change from L back abscess. Has not had fever, chills or sweats.     Past Medical History:  Diagnosis Date   Arthritis    All over   CAD (coronary artery disease), native coronary artery, minimal by cardiac cath 01/08/19 01/09/2019   Chronic pain syndrome    Diabetes mellitus    History of cocaine use    History of kidney stones    Hypercholesteremia    Hypertension    Low back pain    Lumbar radiculopathy    Right leg pain    Seborrheic dermatitis of scalp    Shingles     Patient Active Problem List   Diagnosis Date Noted   Long term current use of oral hypoglycemic drug 03/16/2023   Dyslipidemia 03/16/2023   Type 2 diabetes mellitus with stage 3b chronic kidney disease, with long-term current use of insulin (HCC) 03/16/2023   Renal mass 05/13/2022   Spinal stenosis of lumbosacral region 04/21/2021   Spondylolisthesis, lumbar region    Palmar wart 04/05/2021   Acute non-recurrent maxillary sinusitis 04/05/2021   Type 2 diabetes mellitus with hyperglycemia, with long-term current use of insulin (HCC) 08/20/2020   Type 2 diabetes mellitus with microalbuminuria, with long-term current use of insulin (HCC) 10/14/2019   Type 2 diabetes mellitus with diabetic polyneuropathy, with long-term current use of insulin (HCC) 04/15/2019   Hypertriglyceridemia 04/15/2019   Spinal stenosis of lumbar region with neurogenic claudication 03/07/2019   Lumbar radiculopathy 03/07/2019   Chronic bilateral low back pain with bilateral sciatica 03/07/2019   CAD (coronary artery disease), native coronary artery, minimal by cardiac cath 01/08/19 01/09/2019   Syncope and collapse    Spondylosis without myelopathy or radiculopathy, lumbar region  04/26/2018   Cocaine abuse (HCC) 12/19/2016   Shingles outbreak 12/13/2016   Diabetes type 2, uncontrolled 04/19/2016   Metatarsalgia 10/19/2015   Back muscle spasm 11/18/2013   Hyperlipidemia 11/18/2013   HTN (hypertension) 11/22/2012   Chronic pain syndrome 11/22/2012   Tobacco use disorder 11/22/2012   Dental caries 11/22/2012    Past Surgical History:  Procedure Laterality Date   CIRCUMCISION N/A 03/16/2018   Procedure: CIRCUMCISION ADULT WITH PENILE BLOCK;  Surgeon: Sebastian Ache, MD;  Location: [facility];  Service: Urology;  Laterality: N/A;  45 MINS   KNEE ARTHROSCOPY Bilateral    LEFT HEART CATH AND CORONARY ANGIOGRAPHY N/A 01/08/2019   Procedure: LEFT HEART CATH AND CORONARY ANGIOGRAPHY;  Surgeon: Lennette Bihari, MD;  Location: [facility];  Service: Cardiovascular;  Laterality: N/A;   LYMPH NODE DISSECTION Left 05/13/2022   Procedure: RETROPERITONEAL LYMPH NODE DISSECTIONLYMPH NODE DISSECTION;  Surgeon: Sebastian Ache, MD;  Location: [facility];  Service: Urology;  Laterality: Left;   ROBOT ASSISTED LAPAROSCOPIC NEPHRECTOMY Left 05/13/2022   Procedure: XI ROBOTIC ASSISTED LAPAROSCOPIC NEPHRECTOMY;  Surgeon: Sebastian Ache, MD;  Location: [facility];  Service: Urology;  Laterality: Left;  3 HRS       Home Medications    Prior to Admission medications   Medication Sig Start Date End Date Taking? Authorizing Provider  dapagliflozin propanediol (FARXIGA) 10 MG TABS tablet Take 10 mg by mouth daily.   Yes [provider]  Insulin Lispro Prot & Lispro (HUMALOG MIX 75/25 KWIKPEN) (75-25) 100 UNIT/ML Kwikpen Inject 36 Units into the skin in the morning and at bedtime. 08/01/23  Yes Shamleffer, Konrad Dolores, MD  losartan (COZAAR) 25 MG tablet Take 1 tablet (25 mg total) by mouth daily. 08/01/23  Yes Shamleffer, Konrad Dolores, MD  pioglitazone (ACTOS) 30 MG tablet Take 1 tablet (30 mg total) by mouth daily. 08/01/23  Yes Shamleffer, Konrad Dolores, MD   rosuvastatin (CRESTOR) 40 MG tablet Take 1 tablet (40 mg total) by mouth daily. 08/01/23  Yes Shamleffer, Konrad Dolores, MD  acetaminophen (TYLENOL) 650 MG CR tablet Take 2-3 tablets by mouth daily as needed for pain.    [provider]  Continuous Glucose Sensor (DEXCOM G7 SENSOR) MISC 1 Device by Does not apply route as directed. 08/03/23   Shamleffer, Konrad Dolores, MD  glucose blood (ACCU-CHEK GUIDE TEST) test strip USE THREE TIMES DAILY AS DIRECTED 07/06/23   Shamleffer, Konrad Dolores, MD  Insulin Disposable Pump (OMNIPOD 5 G7 PODS, GEN 5,) MISC 1 Device by Does not apply route every other day. 08/03/23   Shamleffer, Konrad Dolores, MD  Insulin Pen Needle (BD PEN NEEDLE NANO 2ND GEN) 32G X 4 MM MISC Inject 1 Device into the skin in the morning and at bedtime. 08/01/23   Shamleffer, Konrad Dolores, MD    Family History Family History  Problem Relation Age of Onset   Cancer Mother    Stroke Father    Heart disease Father    Diabetes Father     Social History Social History   Tobacco Use   Smoking status: Every Day    Current packs/day: 1.00    Average packs/day: 1 pack/day for 50.0 years (50.0 ttl pk-yrs)    Types: Cigarettes   Smokeless tobacco: Former    Types: Engineer, drilling   Vaping status: Never Used  Substance Use Topics   Alcohol use: Not Currently    Comment: occ   Drug use: Not Currently    Types: Marijuana, Cocaine    Comment: twice a week     Allergies   Patient has no known allergies.   Review of Systems Review of Systems As noted in HPI  Physical Exam Triage Vital Signs ED Triage Vitals  Encounter Vitals Group     BP 09/25/23 0822 125/78     Systolic BP Percentile --      Diastolic BP Percentile --      Pulse Rate 09/25/23 0822 71     Resp 09/25/23 0822 18     Temp 09/25/23 0822 97.7 F (36.5 C)     Temp Source 09/25/23 0822 Oral     SpO2 09/25/23 0822 95 %     Weight --      Height --      Head Circumference --      Peak  Flow --      Pain Score 09/25/23 0820 0     Pain Loc --      Pain Education --      Exclude from Growth Chart --    No data found.  Updated Vital Signs BP 125/78 (BP Location: Left Arm)   Pulse 71   Temp 97.7 F (36.5 C) (Oral)   Resp 18   SpO2 95%   Visual Acuity Right Eye Distance:   Left Eye Distance:   Bilateral Distance:    Right Eye Near:   Left Eye Near:    Bilateral  Near:     Physical Exam Vitals reviewed.  Constitutional:      General: He is not in acute distress.    Appearance: He is not toxic-appearing.  HENT:     Right Ear: External ear normal.     Left Ear: External ear normal.  Eyes:     General: No scleral icterus.    Conjunctiva/sclera: Conjunctivae normal.  Pulmonary:     Effort: Pulmonary effort is normal.  Musculoskeletal:     Cervical back: Neck supple.  Skin:    General: Skin is warm and dry.     Comments: L back abscess with packing in place. Has induration of about 2.5 cm x 2 cm with mild redness on the border of the wound. There is small amount of serous blood with yellow matter on dressing and pack. Pack was removed. Wound irrigated with 10 ml saline. Area dried well, and 1/2 inch sterile packing inserted with dressing. Pt tolerated this fine.   Neurological:     Mental Status: He is alert and oriented to person, place, and time.     Gait: Gait normal.  Psychiatric:        Mood and Affect: Mood normal.        Behavior: Behavior normal.        Thought Content: Thought content normal.        Judgment: Judgment normal.      UC Treatments / Results  Labs (all labs ordered are listed, but only abnormal results are displayed) Labs Reviewed - No data to display  EKG   Radiology No results found.  Procedures Procedures (including critical care time)  Medications Ordered in UC Medications - No data to display  Initial Impression / Assessment and Plan / UC Course  I have reviewed the triage vital signs and the nursing notes.  R  back abscess FU  FU in 4 days for wound check and possible packing change.      Final Clinical Impressions(s) / UC Diagnoses   Final diagnoses:  Abscess re-check     Discharge Instructions      Come back in 4 days for another wound check    ED Prescriptions   None    PDMP not reviewed this encounter.   Garey Ham, [place] 09/25/23 [phone]

## 2023-09-29 ENCOUNTER — Encounter (HOSPITAL_COMMUNITY): Payer: Self-pay

## 2023-09-29 ENCOUNTER — Ambulatory Visit (HOSPITAL_COMMUNITY): Admission: EM | Admit: 2023-09-29 | Discharge: 2023-09-29 | Disposition: A | Discharge status: Home / Self Care

## 2023-09-29 VITALS — BP 122/68 | HR 63 | Temp 97.8°F | Resp 19

## 2023-09-29 DIAGNOSIS — Z5189 Encounter for other specified aftercare: Secondary | ICD-10-CM | POA: Diagnosis not present

## 2023-09-29 NOTE — ED Triage Notes (Signed)
 Pt reports that he is here to get packing removed from his back that he had placed 4 days ago. Denies any pain or issues.

## 2023-09-29 NOTE — ED Provider Notes (Signed)
 [facility]  Note:  This document was prepared using Dragon voice recognition software and may include unintentional dictation errors.  MRN: [id number] DOB: 1956/02/24  Subjective:   Ryan Farmer is a 68 y.o. male presenting for reevaluation and packing removal of abscess on left back.  Packing still in place.  Patient denies any pain or drainage over the last couple of days.  Denies fever, swelling, redness, warmth, pain.  No other medical concerns at this time.  No current facility-administered medications for this encounter.  Current Outpatient Medications:    acetaminophen (TYLENOL) 650 MG CR tablet, Take 2-3 tablets by mouth daily as needed for pain., Disp: , Rfl:    Continuous Glucose Sensor (DEXCOM G7 SENSOR) MISC, 1 Device by Does not apply route as directed., Disp: 9 each, Rfl: 3   dapagliflozin propanediol (FARXIGA) 10 MG TABS tablet, Take 10 mg by mouth daily., Disp: , Rfl:    glucose blood (ACCU-CHEK GUIDE TEST) test strip, USE THREE TIMES DAILY AS DIRECTED, Disp: 300 strip, Rfl: 1   Insulin Disposable Pump (OMNIPOD 5 G7 PODS, GEN 5,) MISC, 1 Device by Does not apply route every other day., Disp: 45 each, Rfl: 3   Insulin Lispro Prot & Lispro (HUMALOG MIX 75/25 KWIKPEN) (75-25) 100 UNIT/ML Kwikpen, Inject 36 Units into the skin in the morning and at bedtime., Disp: 75 mL, Rfl: 3   Insulin Pen Needle (BD PEN NEEDLE NANO 2ND GEN) 32G X 4 MM MISC, Inject 1 Device into the skin in the morning and at bedtime., Disp: 200 each, Rfl: 3   losartan (COZAAR) 25 MG tablet, Take 1 tablet (25 mg total) by mouth daily., Disp: 90 tablet, Rfl: 3   pioglitazone (ACTOS) 30 MG tablet, Take 1 tablet (30 mg total) by mouth daily., Disp: 90 tablet, Rfl: 3   rosuvastatin (CRESTOR) 40 MG tablet, Take 1 tablet (40 mg total) by mouth daily., Disp: 90 tablet, Rfl: 3   No Known Allergies  Past Medical History:  Diagnosis Date   Arthritis    All over   CAD (coronary artery disease),  native coronary artery, minimal by cardiac cath 01/08/19 01/09/2019   Chronic pain syndrome    Diabetes mellitus    History of cocaine use    History of kidney stones    Hypercholesteremia    Hypertension    Low back pain    Lumbar radiculopathy    Right leg pain    Seborrheic dermatitis of scalp    Shingles      Past Surgical History:  Procedure Laterality Date   CIRCUMCISION N/A 03/16/2018   Procedure: CIRCUMCISION ADULT WITH PENILE BLOCK;  Surgeon: Sebastian Ache, MD;  Location[facility];  Service: Urology;  Laterality: N/A;  45 MINS   KNEE ARTHROSCOPY Bilateral    LEFT HEART CATH AND CORONARY ANGIOGRAPHY N/A 01/08/2019   Procedure: LEFT HEART CATH AND CORONARY ANGIOGRAPHY;  Surgeon: Lennette Bihari, MD;  Location: [facility];  Service: Cardiovascular;  Laterality: N/A;   LYMPH NODE DISSECTION Left 05/13/2022   Procedure: RETROPERITONEAL LYMPH NODE DISSECTIONLYMPH NODE DISSECTION;  Surgeon: Sebastian Ache, MD;  Location: [facility];  Service: Urology;  Laterality: Left;   ROBOT ASSISTED LAPAROSCOPIC NEPHRECTOMY Left 05/13/2022   Procedure: XI ROBOTIC ASSISTED LAPAROSCOPIC NEPHRECTOMY;  Surgeon: Sebastian Ache, MD;  Location: [facility];  Service: Urology;  Laterality: Left;  3 HRS    Family History  Problem Relation Age of Onset   Cancer Mother  Stroke Father    Heart disease Father    Diabetes Father     Social History   Tobacco Use   Smoking status: Every Day    Current packs/day: 1.00    Average packs/day: 1 pack/day for 50.0 years (50.0 ttl pk-yrs)    Types: Cigarettes   Smokeless tobacco: Former    Types: Engineer, drilling   Vaping status: Never Used  Substance Use Topics   Alcohol use: Not Currently    Comment: occ   Drug use: Not Currently    Types: Marijuana, Cocaine    Comment: twice a week    ROS Refer to HPI for ROS details.  Objective:   Vitals: BP 122/68 (BP Location: Left Arm)   Pulse 63   Temp 97.8 F (36.6 C) (Oral)    Resp 19   SpO2 93%   Physical Exam Vitals and nursing note reviewed.  Constitutional:      General: He is not in acute distress.    Appearance: Normal appearance. He is well-developed. He is not ill-appearing or toxic-appearing.  HENT:     Head: Normocephalic.  Cardiovascular:     Rate and Rhythm: Normal rate.  Pulmonary:     Effort: Pulmonary effort is normal. No respiratory distress.  Skin:    General: Skin is warm and dry.     Capillary Refill: Capillary refill takes less than 2 seconds.     Findings: Wound present. No abscess, erythema or rash.     Comments: Packing removed, minimal pus noted on bandage, minimal bleeding, irrigated with 10 mL saline, bandage placed over site with bacitracin to prevent repeat infection.  Neurological:     General: No focal deficit present.     Mental Status: He is alert and oriented to person, place, and time.  Psychiatric:        Mood and Affect: Mood normal.        Behavior: Behavior normal.     Procedures  No results found for this or any previous visit (from the past 24 hours).  Assessment and Plan :   PDMP not reviewed this encounter.  1. Wound check, abscess    -Wound packing removed, irrigation with 10 mL saline, bandage placed over site, wound appears to be healing well no secondary infection -Continue to monitor site for any changes severity if there is any escalation of current symptoms or development of new symptoms follow-up for further evaluation and management  Tonny Bollman, Jazalyn Mondor B, NP 09/29/23 [phone]

## 2023-09-29 NOTE — Discharge Instructions (Addendum)
 Wound check, abscess -Wound packing removed, irrigation with 10 mL saline, bandage placed over site, wound appears to be healing well no secondary infection -Continue to monitor site for any changes severity if there is any escalation of current symptoms or development of new symptoms follow-up for further evaluation and management

## 2023-10-23 ENCOUNTER — Telehealth: Payer: Self-pay | Admitting: Nutrition

## 2023-10-23 IMAGING — MR MR PROSTATE WO/W CM
12 series · 48 of 48 positions shown · IV contrast (20 mL Multihance)
Comparison: None.

CLINICAL DATA: Elevated PSA.

EXAM:
MR PROSTATE WITHOUT AND WITH CONTRAST
TECHNIQUE: Multiplanar multisequence MRI images were obtained of the pelvis
centered about the prostate. Pre and post contrast images were
obtained.
CONTRAST:  20mL MULTIHANCE GADOBENATE DIMEGLUMINE 529 MG/ML IV SOLN

[Series 3: T2 · coronal · 3.0mm · 0.56mm/px · 1 of 23 slices shown (1 of 3)]
[im 1/23]
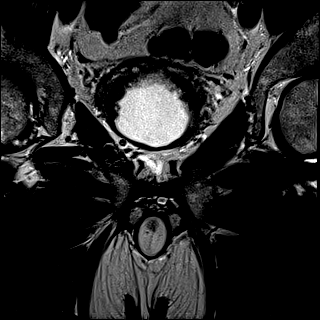

[Series 4: T1 · axial · 5.0mm · 1.25mm/px · 1 of 96 slices shown]
[im 1/96]
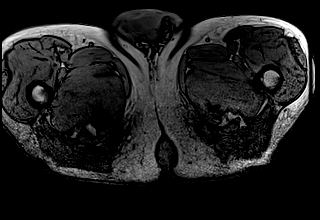

[Series 5: DWI · axial · 3.0mm · 1.75mm/px · 1 of 74 slices shown (1 of 3)]
[im 1/74]
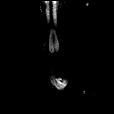

[Series 6: DWI · axial · 3.0mm · 1.75mm/px · 1 of 25 slices shown (2 of 3)]
[im 1/25]
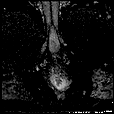

[Series 7: DWI · axial · 3.0mm · 1.75mm/px · 1 of 25 slices shown (3 of 3)]
[im 1/25]
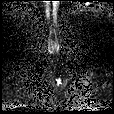

[Series 8: T2 · axial · 3.0mm · 0.56mm/px · 1 of 25 slices shown (2 of 3)]
[im 1/25]
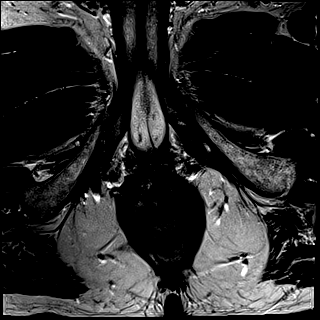

[Series 9: T2 · axial · 1.0mm · 1.04mm/px · z∈[-22,+49]mm · 2 of 72 slices shown (3 of 3)]
[im 1/72]
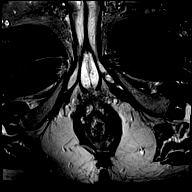
[im 72/72]
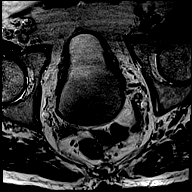

[Series 10: pre t1_twist_tra_dyn · axial · non-contrast · 3.5mm · 0.83mm/px · 1 of 24 slices shown]
[im 1/24]
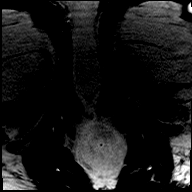

[Series 11: post t1_twist_tra_dyn-copy center · axial · non-contrast · 3.5mm · 0.83mm/px · z∈[-29,+52]mm · 18 of 720 slices shown]
[im 1/720]
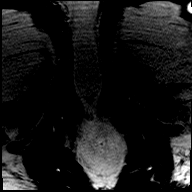
[im 43/720]
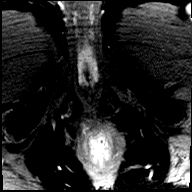
[im 85/720]
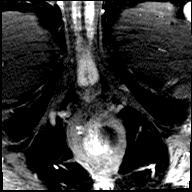
[im 127/720]
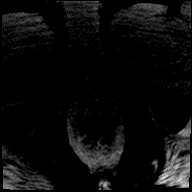
[im 170/720]
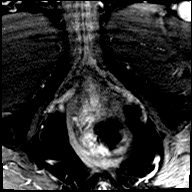
[im 212/720]
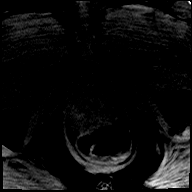
[im 254/720]
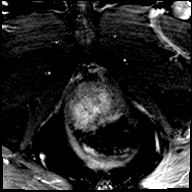
[im 297/720]
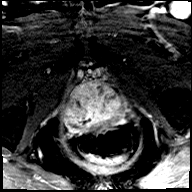
[im 339/720]
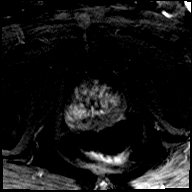
[im 381/720]
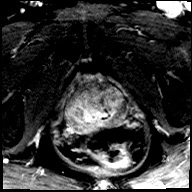
[im 423/720]
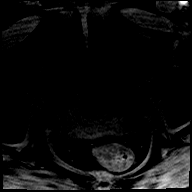
[im 466/720]
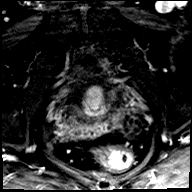
[im 508/720]
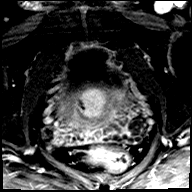
[im 550/720]
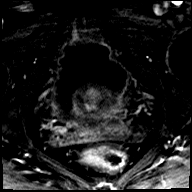
[im 593/720]
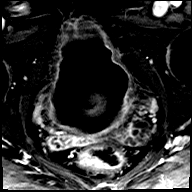
[im 635/720]
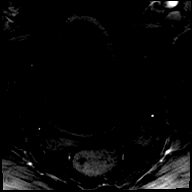
[im 677/720]
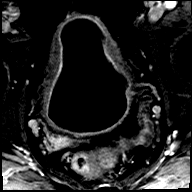
[im 720/720]
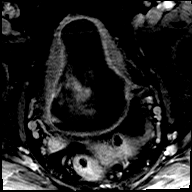

[Series 12: post t1_twist_tra_dyn-copy cent_sub · axial · 3.5mm · 0.83mm/px · z∈[-29,+52]mm · 17 of 696 slices shown]
[im 1/696]
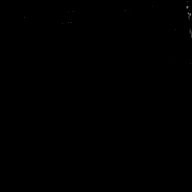
[im 44/696]
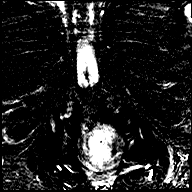
[im 87/696]
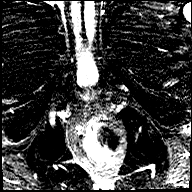
[im 131/696]
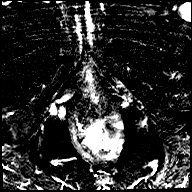
[im 174/696]
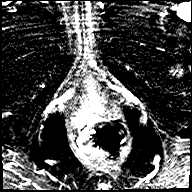
[im 218/696]
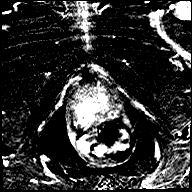
[im 261/696]
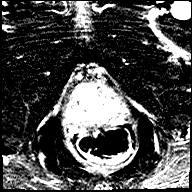
[im 305/696]
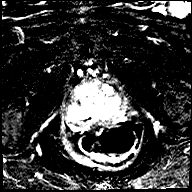
[im 348/696]
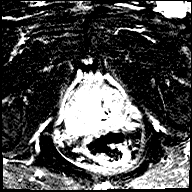
[im 391/696]
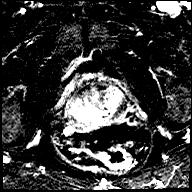
[im 435/696]
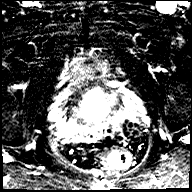
[im 478/696]
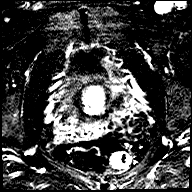
[im 522/696]
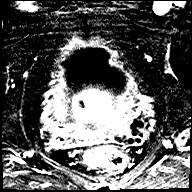
[im 565/696]
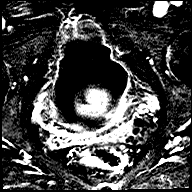
[im 609/696]
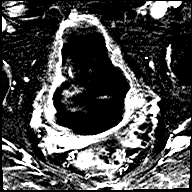
[im 652/696]
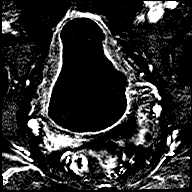
[im 696/696]
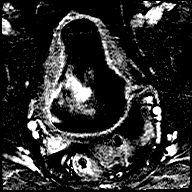

[Series 13: t1_vibe_dixon_tra_f · axial · 2.5mm · 0.91mm/px · z∈[-30,+167]mm · 2 of 80 slices shown]
[im 1/80]
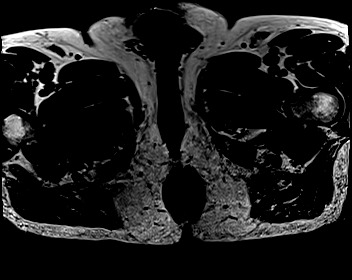
[im 80/80]
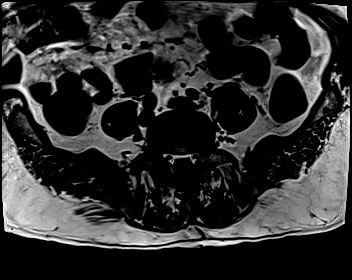

[Series 14: t1_vibe_dixon_tra_w · axial · 2.5mm · 0.91mm/px · z∈[-30,+167]mm · 2 of 80 slices shown]
[im 1/80]
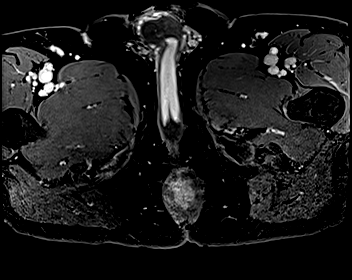
[im 80/80]
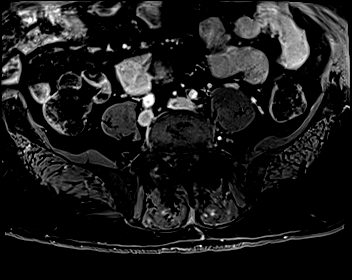

[48 of 48 positions shown; findings below may reference images not displayed]

FINDINGS: Prostate:

-- Peripheral Zone: Linear/wedge shaped hypointensities are noted on
ADC; however, no focal ADC hypointense or high b-value DWI
hyperintense nodules are identified.

-- Transition/Central Zone: Mildly enlarged with encapsulated BPH
nodules. No suspicious nodules identified on T2-weighted or
diffusion sequences.

-- Measurements/Volume:  5.3 by 4.1 x 5.3 cm (volume = 60 cm^3)

Transcapsular spread:  Absent

Seminal vesicle involvement:  Absent

Neurovascular bundle involvement:  Absent

Pelvic adenopathy: None visualized

Bone metastasis: None visualized

Other: Diffuse bladder wall thickening, consistent with chronic
bladder outlet obstruction. Sigmoid diverticulosis noted, without
evidence of diverticulitis.
IMPRESSION: No radiographic evidence of high-grade prostate carcinoma. PI-RADS 2
(v.2.1): Low (clinically significant cancer unlikely)

## 2023-10-23 NOTE — Telephone Encounter (Signed)
 Patient reports that he has his pump, but does not have insulin and sensors.  Please order these from his pharmacy

## 2023-10-26 ENCOUNTER — Telehealth: Payer: Self-pay

## 2023-10-26 NOTE — Telephone Encounter (Signed)
 Left vm for patient to callback regarding lab error.

## 2023-10-31 MED ORDER — DEXCOM G7 SENSOR MISC: 1.0000 | 3 refills | Status: DC

## 2023-10-31 MED ORDER — INSULIN LISPRO 100 UNIT/ML IJ SOLN: INTRAMUSCULAR | 3 refills | Status: DC

## 2023-10-31 NOTE — Telephone Encounter (Signed)
 Insulin  needed for pump training

## 2023-10-31 NOTE — Addendum Note (Signed)
 Addended by: Hall Levering on: 10/31/2023 02:33 PM   Modules accepted: Orders

## 2023-10-31 NOTE — Telephone Encounter (Signed)
 yes

## 2023-10-31 NOTE — Telephone Encounter (Signed)
 Is patient doing training with you ?

## 2023-10-31 NOTE — Addendum Note (Signed)
 Addended by: Valentina Gasman on: 10/31/2023 01:33 PM   Modules accepted: Orders

## 2023-11-02 ENCOUNTER — Telehealth: Payer: Self-pay | Admitting: Dietician

## 2023-11-02 NOTE — Telephone Encounter (Signed)
 Patient called and states that he needs to cancel his MD and pump training appointment for next week as he will not have money for gas.  Pump training appointment changed to 11/15/2023 and will message medical team to call him to change his MD appointment.  Cydne Doyne, RD, LDN, CDCES, DipACLM

## 2023-11-07 ENCOUNTER — Ambulatory Visit: Admitting: Nutrition

## 2023-11-07 ENCOUNTER — Ambulatory Visit: Payer: 59 | Admitting: Internal Medicine

## 2023-11-14 ENCOUNTER — Telehealth: Payer: Self-pay | Admitting: Nutrition

## 2023-11-14 NOTE — Telephone Encounter (Signed)
 LVM of pump training tomorrow.  Pt. To charge PDM before coming, bring starter kit box, Humalog  insulin , sensors, also to take no insulin  tomorrow morning

## 2023-11-15 ENCOUNTER — Encounter: Attending: Internal Medicine | Admitting: Nutrition

## 2023-11-15 ENCOUNTER — Telehealth: Payer: Self-pay | Admitting: Nutrition

## 2023-11-15 DIAGNOSIS — Z794 Long term (current) use of insulin: Secondary | ICD-10-CM | POA: Insufficient documentation

## 2023-11-15 DIAGNOSIS — E1165 Type 2 diabetes mellitus with hyperglycemia: Secondary | ICD-10-CM | POA: Insufficient documentation

## 2023-11-15 DIAGNOSIS — E1122 Type 2 diabetes mellitus with diabetic chronic kidney disease: Secondary | ICD-10-CM | POA: Insufficient documentation

## 2023-11-15 DIAGNOSIS — N1832 Chronic kidney disease, stage 3b: Secondary | ICD-10-CM | POA: Insufficient documentation

## 2023-11-15 NOTE — Patient Instructions (Signed)
 Give insulin  before all meals:  Put 6 into the carb box  Put blood sugar reading into the BG box Press add to calculator, [employer] start

## 2023-11-15 NOTE — Telephone Encounter (Signed)
 I phoned patient to see if he had given his bolus before lunch today.  He said he ate, but did not give the insulin .  His blood sugar was 320, 15 minutes pcL.  He put his sister on the phone for me.  She says the PDM says "no active pod".   I explained the he will need to go back on the 70/30 injections until he comes in to see me.  Appt. Scheduled for next Monday morning.

## 2023-11-15 NOTE — Progress Notes (Signed)
 Patient was identified by site and DOB.  He is here today to start his insulin  pump.  He did not take his 70/30 insulin  this morning.  He brought in insulin  from the drug store he picked up and it was 70/30 insulin .  The drug store was called and they admitted they made a mistake.  He then went to the drug store and came back with Humalog  insulin .  He is illiterate and can not read or write, except for numbers.  His phone would not support the Searchlight 2Plus or Dexcom G6 or G7 apps.  He will then use this pump as a dash system.   He was shown how to apply the sensor and the readings are going to a reader given.  The reader was set with date/time and the sensor was started and linked to the reader.  His blood sugar was 320 at 10:30AM  He was told to bring the reader with him at every office visit. The PDM was set per orders from Dr. Rosalea Collin:  Basal rate: 1.0u/hr.  I/C: 1, with written instructions to give 6grams at every meal.  He was told to give 1 gram at HS for snack.  His ISF: 30, timing: 4 hours, max basal: 2.0u/hr, max bolus: 20.   He was shown how to fill a pod, link it to the pdm and and apply this to his abdomen.  He did this with some assistance from me. We also discussed how to give a bolus, by putting in "6" into the carb box, putting in blood sugar reading from his reader into the bg box, and pressing "add to calculator." confirm, and start.  The steps were written for his sister who can read,and he was show from the pictures in the starter booklet how to do this.  We also discussed how to do correction doses whenever his blood sugars go over 250.   By the time we got his pod on, his blood sugars had drop to 287.  He did a correction dose with little assistance from me.  We reviewed again how to give a bolus and he was able to do this with some assistance from me.  Says his sister lives with him and can help him with this. We discussed high blood sugar protocols and the need to take only the humalog   insulin .  He reported good understanding of this.

## 2023-11-20 ENCOUNTER — Encounter: Admitting: Nutrition

## 2023-11-20 DIAGNOSIS — E1165 Type 2 diabetes mellitus with hyperglycemia: Secondary | ICD-10-CM

## 2023-11-21 ENCOUNTER — Telehealth: Payer: Self-pay | Admitting: Nutrition

## 2023-11-21 NOTE — Telephone Encounter (Signed)
 Sister was talked through how to give a bolus, while patient watched beside her.  I heard the confirmation beep of the bolus delivery, and patient was then going to eat breakfast.  Sister said she understood completely how to give to bolus and had no questions for me at this time.  I also talked her through the steps to change the pod in 2 days.  She said she saw this on you tub and will review it again.

## 2023-11-21 NOTE — Progress Notes (Signed)
 Patient is here today to restart his insulin  pump.  He says that he does not know why his pump said no active pod.  He says that he does not remember doing anything to the PDM.  The patient filled a pod with insulin  with very little assistance from me.  The pump was restarted and we reviewed X3 how to give a bolus, by putting 6 into the carb box, putting his blood sugar reading from the reader into the BG box and pressing add to calculator, confirm and start.  He re demonstrated this X3 correctly with no assistance from me.  Before leaving, he asked if I would talk to his sister tomorrow about how to do this as well.  I told him that I will call them tomorrow at 10AM to discuss this.  He had no final questions. WE reviewed again the need to stop his long acting insulin  and to how to change out his pod.  He was shown in the starter booklet the page for this and it was marked with a green post it note.  He wanted to change out his Dexcom 2 days earlier, and we did this as well.  He did this on his own, and put in the correct pairing code numbers and started his sensor.  He was given the help line number for Dexcom as well as OmniPod if questions develop.

## 2023-11-21 NOTE — Patient Instructions (Signed)
 Change pod every 3 days Change sensor every 10 days Give bolus-6u  for all meals and 1u for all snacks, making sure to put in blood sugar readings the the BG box, pressing add to calculator, confirm and start.

## 2023-11-28 ENCOUNTER — Telehealth: Payer: Self-pay | Admitting: Nutrition

## 2023-11-28 NOTE — Telephone Encounter (Signed)
 Patient reports that he only has 3 bottles of insulin  left.  I told him that this should last him about another month.  He seemed surprised.  His sister is not home, and he says that his blood sugar went high last night because he overate, and when he gives more insulin , it never comes down.  Said he has to take insulin  by a 'Shot" to get it to come down.  He will come in tomorrow for me to review his PDM and see how much insulin  he is taking.

## 2023-11-29 ENCOUNTER — Telehealth: Payer: Self-pay | Admitting: Nutrition

## 2023-11-29 ENCOUNTER — Encounter (HOSPITAL_BASED_OUTPATIENT_CLINIC_OR_DEPARTMENT_OTHER): Admitting: Nutrition

## 2023-11-29 DIAGNOSIS — N1832 Chronic kidney disease, stage 3b: Secondary | ICD-10-CM | POA: Diagnosis not present

## 2023-11-29 DIAGNOSIS — E1122 Type 2 diabetes mellitus with diabetic chronic kidney disease: Secondary | ICD-10-CM

## 2023-11-29 DIAGNOSIS — Z794 Long term (current) use of insulin: Secondary | ICD-10-CM

## 2023-11-29 NOTE — Telephone Encounter (Signed)
 Sister asked if her brother could try Ozempic.  Says it has helped her control her blood sugar with a lot less insulin .  He was told that he has an appointment next month with Dr. Rosalea Collin, and to discuss this with her then.  But patient asked if I could forward the request now.

## 2023-11-29 NOTE — Progress Notes (Signed)
 Patient reports that he is needing to give insulin  by injections because he is afraid of not enough pods, and blood sugars not coming down.  His download was not possible, so we had to go to OmniPod help line and put in into [employer] again with new log in and password:  UN: [email or web address],  PW: [email or web address].  NOt able to show up for one hour.  We reviewed again how to bolus.  He says that his sister helps him with this.  History shows he is bolusing before meals, but not doing correction doses when blood sugars are high.  He was again shown how to do this and he reported that he understood how to do this and when to do this.   His sister gave him an empty box of Ozempic and wanted to know if he could use this to help his blood sugar readings.  Not sent to DR. Shamleffer.  History shows that he is using about 60-80 units of insulin  per day.

## 2023-11-29 NOTE — Patient Instructions (Signed)
 Always do a correction dose any time your blood sugar goes over 250.  To do this, simply press the insulin  vial, put in blood sugar reading from your reader, and press "add to calculator", "confirm' and "start"

## 2023-11-30 MED ORDER — SEMAGLUTIDE(0.25 OR 0.5MG/DOS) 2 MG/3ML ~~LOC~~ SOPN: 0.2500 mg | PEN_INJECTOR | SUBCUTANEOUS | 2 refills | Status: DC

## 2023-11-30 NOTE — Telephone Encounter (Signed)
 LMTCB

## 2023-12-01 NOTE — Telephone Encounter (Signed)
 Patient notified and will pick up medication

## 2024-01-01 ENCOUNTER — Encounter: Payer: Self-pay | Admitting: Internal Medicine

## 2024-01-01 ENCOUNTER — Telehealth: Payer: Self-pay

## 2024-01-01 ENCOUNTER — Ambulatory Visit (INDEPENDENT_AMBULATORY_CARE_PROVIDER_SITE_OTHER): Admitting: Internal Medicine

## 2024-01-01 VITALS — BP 130/72 | HR 85 | Ht 72.0 in | Wt 233.0 lb

## 2024-01-01 DIAGNOSIS — E785 Hyperlipidemia, unspecified: Secondary | ICD-10-CM | POA: Diagnosis not present

## 2024-01-01 DIAGNOSIS — E1129 Type 2 diabetes mellitus with other diabetic kidney complication: Secondary | ICD-10-CM | POA: Diagnosis not present

## 2024-01-01 DIAGNOSIS — R809 Proteinuria, unspecified: Secondary | ICD-10-CM

## 2024-01-01 DIAGNOSIS — E1165 Type 2 diabetes mellitus with hyperglycemia: Secondary | ICD-10-CM | POA: Diagnosis not present

## 2024-01-01 DIAGNOSIS — Z794 Long term (current) use of insulin: Secondary | ICD-10-CM | POA: Diagnosis not present

## 2024-01-01 DIAGNOSIS — E1142 Type 2 diabetes mellitus with diabetic polyneuropathy: Secondary | ICD-10-CM

## 2024-01-01 LAB — POCT GLYCOSYLATED HEMOGLOBIN (HGB A1C): Hemoglobin A1C: 8.5 % — AB (ref 4.0–5.6)

## 2024-01-01 MED ORDER — ROSUVASTATIN CALCIUM 40 MG PO TABS
40.0000 mg | ORAL_TABLET | Freq: Every day | ORAL | 3 refills | Status: AC
  Filled 2024-04-01: qty 90, 90d supply, fill #0

## 2024-01-01 MED ORDER — LOSARTAN POTASSIUM 25 MG PO TABS: 25.0000 mg | ORAL_TABLET | Freq: Every day | ORAL | 3 refills | Status: DC

## 2024-01-01 MED ORDER — LOSARTAN POTASSIUM 25 MG PO TABS: 25.0000 mg | ORAL_TABLET | Freq: Every day | ORAL | 3 refills | Status: AC

## 2024-01-01 MED ORDER — DEXCOM G7 SENSOR MISC: 1.0000 | 3 refills | Status: DC

## 2024-01-01 MED ORDER — SEMAGLUTIDE(0.25 OR 0.5MG/DOS) 2 MG/3ML ~~LOC~~ SOPN: 0.5000 mg | PEN_INJECTOR | SUBCUTANEOUS | 2 refills | Status: DC

## 2024-01-01 MED ORDER — PIOGLITAZONE HCL 30 MG PO TABS: 30.0000 mg | ORAL_TABLET | Freq: Every day | ORAL | 3 refills | Status: DC

## 2024-01-01 MED ORDER — DAPAGLIFLOZIN PROPANEDIOL 10 MG PO TABS: 10.0000 mg | ORAL_TABLET | Freq: Every day | ORAL | 3 refills | Status: AC

## 2024-01-01 MED ORDER — INSULIN LISPRO 100 UNIT/ML IJ SOLN: INTRAMUSCULAR | 3 refills | Status: DC

## 2024-01-01 NOTE — Telephone Encounter (Signed)
 Spoke with [facility] regarding refills  Losartan  ready for pick up Actos  was picked up on 12/02/23 for 90 tabs Rosuvastatin - processing for pick up Losartan  ready for pick up

## 2024-01-01 NOTE — Progress Notes (Unsigned)
 Name: Ryan Farmer  Age/ Sex: 68 y.o., male   MRN/ DOB: [id number], Nov 29, 1955     PCP: System, Provider Not In   Reason for Endocrinology Evaluation: Type 2 Diabetes Mellitus  Initial Endocrine Consultative Visit: 04/15/2019    PATIENT IDENTIFIER: Ryan Farmer is a 68 y.o. male with a past medical history of CAD, T2DM, Hyperlipidemia and polysubstance abuse, s/p left nephrectomy due to [facility] 05/2022. The patient has followed with Endocrinology clinic since 04/15/2019 for consultative assistance with management of his diabetes.  DIABETIC HISTORY:  Ryan Farmer was diagnosed with DM in 2011. He has been on SU in the past, has been on metformin  since diagnosis, insulin  started in 2018. His hemoglobin A1c has ranged from 6.4% in 2017, peaking at 12.6% in 2020.  On his initial visit to our clinic he was on Lantus  and Metformin  with an A1c 10.4% . We stopped lantus  and started a novolog  Mix.    We attempted MDI regimen in 2022 but the patient did not like that regimen as he felt the insulin  works were stronger and he was switched back July 2023  Lives with sister, on disability ( can not read or write)    Discontinued metformin  03/2023 based on his request due to skepticism about side effects Started pioglitazone  03/2023   He was started on the OmniPod 11/2023  Started Ozempic  11/2023  SUBJECTIVE:   During the last visit (08/01/2023): A1c 10.6%  Today (01/01/2024): Ryan Farmer is here for a follow up on diabetes management.  Checks glucose occasionally , no hypoglycemia   He was treated for back abscess in March, 2025   He met with our CDE for pump training 11/2023  He ran out insulin  and stopped using the pump on 6/14   Has been using humalog  flex pens 40 units multiple times a day ??? Has had rare hypoglycemia    Patient discontinued all his medications because his sister with that they cause heart issues?    Denies nausea or vomiting  No changes in bowel movements NO  LE edema    This patient with type 2 diabetes is treated with OmniPod (insulin  pump). During the visit the pump basal and bolus doses were reviewed including carb/insulin  rations and supplemental doses. The clinical list was updated. The glucose meter download was reviewed in detail to determine if the current pump settings are providing the best glycemic control without excessive hypoglycemia.  Pump and meter download:    Pump   OmniPod Settings   Insulin  type   Humalog    Basal rate       0000               I:C ratio       0000                   Sensitivity       0000        Goal       0000  110             Type & Model of Pump: omnipod Insulin  Type: Currently using Humalog .  There is no height or weight on file to calculate BMI.  PUMP STATISTICS: No data   HOME DIABETES REGIMEN:  Humalog   Farxiga  10 mg, 1 tablet daily  Pioglitazone  30 mg daily Ozempic  0.5 mg weekly Rosuvastatin  40 mg daily Losartan  25 mg daily  CONTINUOUS GLUCOSE MONITORING RECORD INTERPRETATION    Dates of Recording: 6/10 -  01/01/2024  Sensor description: Dexcom  Results statistics:   CGM use % of time 95  Average and SD 236/65  Time in range    19    %  % Time Above 180 41  % Time above 250 40  % Time Below target 0   Glycemic patterns summary: Hyperglycemia has been noted throughout the day and night  Hyperglycemic episodes all day and night  Hypoglycemic episodes occurred N/A  Overnight periods: High    DIABETIC COMPLICATIONS: Microvascular complications:  Neuropathy  Denies: CKD , retinopathy  Last eye exam: Completed > 1    Macrovascular complications:  CAD Denies: PVD, CVA      HISTORY:  Past Medical History:  Past Medical History:  Diagnosis Date   Arthritis    All over   CAD (coronary artery disease), native coronary artery, minimal by cardiac cath 01/08/19 01/09/2019   Chronic pain syndrome    Diabetes mellitus    History of cocaine  use     History of kidney stones    Hypercholesteremia    Hypertension    Low back pain    Lumbar radiculopathy    Right leg pain    Seborrheic dermatitis of scalp    Shingles    Past Surgical History:  Past Surgical History:  Procedure Laterality Date   CIRCUMCISION N/A 03/16/2018   Procedure: CIRCUMCISION ADULT WITH PENILE BLOCK;  Surgeon: Alvaro Hummer, MD;  Location: [facility];  Service: Urology;  Laterality: N/A;  45 MINS   KNEE ARTHROSCOPY Bilateral    LEFT HEART CATH AND CORONARY ANGIOGRAPHY N/A 01/08/2019   Procedure: LEFT HEART CATH AND CORONARY ANGIOGRAPHY;  Surgeon: Burnard Debby LABOR, MD;  Location: [facility];  Service: Cardiovascular;  Laterality: N/A;   LYMPH NODE DISSECTION Left 05/13/2022   Procedure: RETROPERITONEAL LYMPH NODE DISSECTIONLYMPH NODE DISSECTION;  Surgeon: Alvaro Hummer, MD;  Location: [facility];  Service: Urology;  Laterality: Left;   ROBOT ASSISTED LAPAROSCOPIC NEPHRECTOMY Left 05/13/2022   Procedure: XI ROBOTIC ASSISTED LAPAROSCOPIC NEPHRECTOMY;  Surgeon: Alvaro Hummer, MD;  Location: [facility];  Service: Urology;  Laterality: Left;  3 HRS   Social History:  reports that he has been smoking cigarettes. He has a 50 pack-year smoking history. He has quit using smokeless tobacco.  His smokeless tobacco use included chew. He reports that he does not currently use alcohol. He reports that he does not currently use drugs after having used the following drugs: Marijuana and Cocaine . Family History:  Family History  Problem Relation Age of Onset   Cancer Mother    Stroke Father    Heart disease Father    Diabetes Father      HOME MEDICATIONS: Allergies as of 01/01/2024   No Known Allergies      Medication List        Accurate as of January 01, 2024  8:45 AM. If you have any questions, ask your nurse or doctor.          Accu-Chek Guide Test test strip Generic drug: glucose blood USE THREE TIMES DAILY AS DIRECTED   acetaminophen  650 MG  CR tablet Commonly known as: TYLENOL  Take 2-3 tablets by mouth daily as needed for pain.   BD Pen Needle Nano 2nd Gen 32G X 4 MM Misc Generic drug: Insulin  Pen Needle Inject 1 Device into the skin in the morning and at bedtime.   Dexcom G7 Sensor Misc 1 Device by Does not apply route as directed.  Farxiga  10 MG Tabs tablet Generic drug: dapagliflozin  propanediol Take 10 mg by mouth daily.   insulin  lispro 100 UNIT/ML injection Commonly known as: HumaLOG  Max daily 60 units   losartan  25 MG tablet Commonly known as: COZAAR  Take 1 tablet (25 mg total) by mouth daily.   Omnipod 5 G7 Pods (Gen 5) Misc 1 Device by Does not apply route every other day.   pioglitazone  30 MG tablet Commonly known as: ACTOS  Take 1 tablet (30 mg total) by mouth daily.   rosuvastatin  40 MG tablet Commonly known as: CRESTOR  Take 1 tablet (40 mg total) by mouth daily.   Semaglutide (0.25 or 0.5MG /DOS) 2 MG/3ML Sopn Inject 0.25 mg into the skin once a week.         OBJECTIVE:   Vital Signs: There were no vitals taken for this visit.  Wt Readings from Last 3 Encounters:  09/21/23 220 lb (99.8 kg)  04/20/23 210 lb (95.3 kg)  03/16/23 213 lb (96.6 kg)     Exam: General: Pt appears well and is in NAD  Lungs: Clear with good BS bilat   Heart: RRR   Neuro: MS is good with appropriate affect, pt is alert and Ox3      DM foot exam: 03/16/2023   The skin of the feet is intact without sores or ulcerations. The pedal pulses are 2+ on right and 2+ on left. The sensation is intact at the right heel to a screening 5.07, 10 gram monofilament    DATA REVIEWED:  Lab Results  Component Value Date   HGBA1C 10.6 (A) 08/01/2023   HGBA1C 10.2 (A) 03/16/2023   HGBA1C 8.6 (H) 04/27/2022    Latest Reference Range & Units 03/16/23 09:22  Sodium 135 - 145 mEq/L 130 (L)  Potassium 3.5 - 5.1 mEq/L 5.3 (H)  Chloride 96 - 112 mEq/L 97  CO2 19 - 32 mEq/L 25  Glucose 70 - 99 mg/dL 451 (HH)  BUN 6 - 23  mg/dL 43 (H)  Creatinine 9.59 - 1.50 mg/dL 8.33 (H)  Calcium  8.4 - 10.5 mg/dL 9.6  GFR >39.99 mL/min 42.82 (L)    Latest Reference Range & Units 03/16/23 09:22  Total CHOL/HDL Ratio  5  Cholesterol 0 - 200 mg/dL 802  HDL Cholesterol >60.99 mg/dL 57.99  Direct LDL mg/dL 882.9  MICROALB/CREAT RATIO 0.0 - 30.0 mg/g 64.8 (H)  Triglycerides 0.0 - 149.0 mg/dL 279.9 Triglyceride is over 400; calculations on Lipids are invalid. (H)    Latest Reference Range & Units 03/16/23 09:22  Creatinine,U mg/dL 72.4  Microalb, Ur 0.0 - 1.9 mg/dL 82.1 (H)  MICROALB/CREAT RATIO 0.0 - 30.0 mg/g 64.8 (H)  (H): Data is abnormally high   In office BG 497 mg/DL  ASSESSMENT / PLAN / RECOMMENDATIONS:   1) Type 2  Diabetes Mellitus, poorly control, With Neuropathy, CKD III  and CAD  complications - Most recent A1c of 8.5 % . Goal A1c < 7.5 %.    -A1c down from 10.6% to 8.5% -He has not been using the pump since June, 14, 2025 as he ran out of insulin , I will send a new prescription with a higher dose of insulin  -We discontinue metformin  based on his request - He was unable to verify any of his medications today, barriers to diabetes self-care is illiteracy  - Patient states he has been using Humalog  FlexPen's 4-5 times a day?  Patient has no hypoglycemia but rather persistent hyperglycemia I am not clear if his Humalog  FlexPen is  a plain Humalog  versus an insulin  mix based on his hyperglycemia it appears that this is plain Humalog  - Patient has stopped oral glycemic agents due to sister reading about cardiovascular side effects, he also tells me he has no refills, but I did refill all his medications up until January, 2026 - I will also increase his Ozempic     MEDICATIONS:  Humalog  per insulin  pump Take Farxiga  10 mg, 1 tablet daily  Take pioglitazone  30 mg daily Increase Ozempic  0.5 mg weekly  EDUCATION / INSTRUCTIONS: BG monitoring instructions: Patient is instructed to check his blood sugars 2 times  a day, fasting and supper . Call [facility] if: BG persistently < 70 I reviewed the Rule of 15 for the treatment of hypoglycemia in detail with the patient. Literature supplied.  2) Diabetic complications:  Eye: Does not have known diabetic retinopathy. Neuro/ Feet: Does  have known diabetic peripheral neuropathy. Renal: Patient does not have known baseline CKD. He is on an ACEI/ARB at present   3) Hypertriglyceridemia:   -I had switched atorvastatin  to rosuvastatin  with a triglyceride of 720 Mg/DL in 0/7975 - He continues with imperfect adherence to statin therapy -We again discussed cardiovascular benefits of statin therapy    Medication  Take rosuvastatin  40 mg daily    4) CKD III/Microalbuminuria :  -He is s/p nephrectomy due to [facility] 05/2022 -Started ARB 03/2023  Medication Take losartan  25 mg daily   F/U in 4 months    Signed electronically by: Stefano Redgie Butts, MD  [facility] End[facility]  [facility] [address] [place] [place], [place] [address] Phone: [phone] FAX: [phone]   CC: System, Provider Not In No address on file Phone: None  Fax: None  Return to Endocrinology clinic as below: Future Appointments  Date Time Provider Department Center  01/01/2024  8:50 AM [place] Non[place], Donell Redgie, MD [id number] None

## 2024-01-01 NOTE — Patient Instructions (Addendum)
-   Humalog   - Increase Ozempic  0.5mg  weekly  - Restart Farxiga  10 mg daily  - Restart Actos  30 mg daily   HOW TO TREAT LOW BLOOD SUGARS (Blood sugar LESS THAN 70 MG/DL) Please follow the RULE OF 15 for the treatment of hypoglycemia treatment (when your (blood sugars are less than 70 mg/dL)   STEP 1: Take 15 grams of carbohydrates when your blood sugar is low, which includes:  3-4 GLUCOSE TABS  OR 3-4 OZ OF JUICE OR REGULAR SODA OR ONE TUBE OF GLUCOSE GEL    STEP 2: RECHECK blood sugar in 15 MINUTES STEP 3: If your blood sugar is still low at the 15 minute recheck --> then, go back to STEP 1 and treat AGAIN with another 15 grams of carbohydrates.

## 2024-01-02 ENCOUNTER — Ambulatory Visit: Payer: Self-pay | Admitting: Internal Medicine

## 2024-01-02 ENCOUNTER — Encounter: Payer: Self-pay | Admitting: Internal Medicine

## 2024-01-02 LAB — BASIC METABOLIC PANEL WITH GFR
BUN/Creatinine Ratio: 22 (calc) (ref 6–22)
BUN: 40 mg/dL — ABNORMAL HIGH (ref 7–25)
CO2: 24 mmol/L (ref 20–32)
Calcium: 9.2 mg/dL (ref 8.6–10.3)
Chloride: 104 mmol/L (ref 98–110)
Creat: 1.84 mg/dL — ABNORMAL HIGH (ref 0.70–1.35)
Glucose, Bld: 198 mg/dL — ABNORMAL HIGH (ref 65–99)
Potassium: 4.8 mmol/L (ref 3.5–5.3)
Sodium: 137 mmol/L (ref 135–146)
eGFR: 40 mL/min/{1.73_m2} — ABNORMAL LOW (ref >=60)

## 2024-01-02 LAB — MICROALBUMIN / CREATININE URINE RATIO
Creatinine, Urine: 114 mg/dL (ref 20–320)
Microalb Creat Ratio: 301 mg/g{creat} — ABNORMAL HIGH (ref <30)
Microalb, Ur: 34.3 mg/dL

## 2024-01-02 LAB — TSH: TSH: 2.06 m[IU]/L (ref 0.40–4.50)

## 2024-01-02 NOTE — Telephone Encounter (Signed)
 Please let the patient know that he continues to have chronic kidney disease and his kidney function is worsening in nature.   Please let the patient know that he needs to be on Farxiga , losartan , as they both protect his kidneys and help improve his kidney function   He also needs to improve his glucose control, as high sugar continues to damage his kidneys     Thanks

## 2024-01-05 ENCOUNTER — Other Ambulatory Visit: Payer: Self-pay

## 2024-01-05 ENCOUNTER — Emergency Department (HOSPITAL_COMMUNITY)

## 2024-01-05 ENCOUNTER — Encounter (HOSPITAL_COMMUNITY): Payer: Self-pay

## 2024-01-05 ENCOUNTER — Emergency Department (HOSPITAL_COMMUNITY)
Admission: EM | Admit: 2024-01-05 | Discharge: 2024-01-05 | Disposition: A | Discharge status: Home / Self Care | Attending: Emergency Medicine | Admitting: Emergency Medicine

## 2024-01-05 DIAGNOSIS — Z720 Tobacco use: Secondary | ICD-10-CM | POA: Insufficient documentation

## 2024-01-05 DIAGNOSIS — R0789 Other chest pain: Secondary | ICD-10-CM | POA: Diagnosis present

## 2024-01-05 DIAGNOSIS — E119 Type 2 diabetes mellitus without complications: Secondary | ICD-10-CM | POA: Diagnosis not present

## 2024-01-05 DIAGNOSIS — I10 Essential (primary) hypertension: Secondary | ICD-10-CM | POA: Insufficient documentation

## 2024-01-05 DIAGNOSIS — I2510 Atherosclerotic heart disease of native coronary artery without angina pectoris: Secondary | ICD-10-CM | POA: Insufficient documentation

## 2024-01-05 DIAGNOSIS — Z79899 Other long term (current) drug therapy: Secondary | ICD-10-CM | POA: Insufficient documentation

## 2024-01-05 DIAGNOSIS — Z794 Long term (current) use of insulin: Secondary | ICD-10-CM | POA: Diagnosis not present

## 2024-01-05 LAB — BASIC METABOLIC PANEL WITH GFR
Anion gap: 13 (ref 5–15)
BUN: 42 mg/dL — ABNORMAL HIGH (ref 8–23)
CO2: 22 mmol/L (ref 22–32)
Calcium: 9.7 mg/dL (ref 8.9–10.3)
Chloride: 103 mmol/L (ref 98–111)
Creatinine, Ser: 2.03 mg/dL — ABNORMAL HIGH (ref 0.61–1.24)
GFR, Estimated: 35 mL/min — ABNORMAL LOW (ref >60)
Glucose, Bld: 139 mg/dL — ABNORMAL HIGH (ref 70–99)
Potassium: 4.4 mmol/L (ref 3.5–5.1)
Sodium: 138 mmol/L (ref 135–145)

## 2024-01-05 LAB — CBC
HCT: 55 % — ABNORMAL HIGH (ref 39.0–52.0)
Hemoglobin: 17.5 g/dL — ABNORMAL HIGH (ref 13.0–17.0)
MCH: 28.7 pg (ref 26.0–34.0)
MCHC: 31.8 g/dL (ref 30.0–36.0)
MCV: 90.2 fL (ref 80.0–100.0)
Platelets: 178 10*3/uL (ref 150–400)
RBC: 6.1 MIL/uL — ABNORMAL HIGH (ref 4.22–5.81)
RDW: 13.7 % (ref 11.5–15.5)
WBC: 9.2 10*3/uL (ref 4.0–10.5)
nRBC: 0 % (ref 0.0–0.2)

## 2024-01-05 LAB — TROPONIN I (HIGH SENSITIVITY)
Troponin I (High Sensitivity): 7 ng/L (ref <18)
Troponin I (High Sensitivity): 8 ng/L (ref <18)

## 2024-01-05 MED ORDER — DEXAMETHASONE SODIUM PHOSPHATE 10 MG/ML IJ SOLN
10.0000 mg | Freq: Once | INTRAMUSCULAR | Status: AC
  Administered 2024-01-05: 10 mg via INTRAMUSCULAR
  Filled 2024-01-05: qty 1

## 2024-01-05 NOTE — Discharge Instructions (Addendum)
 You labs and imaging are reassuring. I have I sent a referral to your cardiology team to get you in to see your heart doctor for reevaluation of your chest pains.  As discussed, your blood sugar can increase due to the steroid shot we gave you in the ED today. Monitor your blood sugar closely.  We do not have sleeves for elbows or knees in the ED, but you can pick them up from any pharmacy.  Get help right away if: You have pain in your chest, neck, arm, jaw, or back, and the pain: Happens more often. Lasts more than a few minutes. Goes away and comes back. Does not get better after you take medicine under your tongue. You're dizzy or light-headed all of a sudden. You faint. You have any combination of these problems: Cold sweats. Heartburn or upset stomach. Trouble breathing. Feeling like you may throw up, or you throw up. Feeling very tired or weak. Feeling worried or nervous.

## 2024-01-05 NOTE — ED Triage Notes (Signed)
 Pt is coming in for chest pain that has been an on-going issue x 2 weeks, it is a dull achy pain in the left side of chest. He does of a lot of co morbidities but he is otherwise pleasant in triage with no accompanying shortness of breath.

## 2024-01-05 NOTE — ED Provider Notes (Signed)
 [facility] AT [facility] Provider Note   CSN: [id number] Arrival date & time: 01/05/24  9853     Patient presents with: Chest Pain   Ryan Farmer is a 68 y.o. male with a history of CAD, hypertension, diabetes mellitus, and chronic pain syndrome who presents the ED today for chest pain.  Patient reports left-sided chest pain intermittently for the past several weeks.  States that symptoms occur and resolve on their own, lasting only couple minutes.  Denies any associated shortness of breath or back pain.  Pain is not worse to touch, with deep breathing, or movements. States that he does not believe he is having a heart attack but just wants to get checked out. Patient also complains of chronic back and knee pain. States that he has gotten a pain shot here before with improvement of symptoms and is requesting one today as well.    Prior to Admission medications   Medication Sig Start Date End Date Taking? Authorizing Provider  acetaminophen  (TYLENOL ) 650 MG CR tablet Take 2-3 tablets by mouth daily as needed for pain. Patient not taking: Reported on 01/01/2024    [provider]  Continuous Glucose Sensor (DEXCOM G7 SENSOR) MISC 1 Device by Does not apply route as directed. 01/01/24   Shamleffer, Ibtehal Jaralla, MD  dapagliflozin  propanediol (FARXIGA ) 10 MG TABS tablet Take 1 tablet (10 mg total) by mouth daily. 01/01/24   Shamleffer, Ibtehal Jaralla, MD  glucose blood (ACCU-CHEK GUIDE TEST) test strip USE THREE TIMES DAILY AS DIRECTED 07/06/23   Shamleffer, Donell Cardinal, MD  Insulin  Disposable Pump (OMNIPOD 5 G7 PODS, GEN 5,) MISC 1 Device by Does not apply route every other day. Patient not taking: Reported on 01/01/2024 08/03/23   Shamleffer, Ibtehal Jaralla, MD  insulin  lispro (HUMALOG ) 100 UNIT/ML injection Max daily 100 units 01/01/24   Shamleffer, Ibtehal Jaralla, MD  Insulin  Pen Needle (BD PEN NEEDLE NANO 2ND GEN) 32G X 4 MM MISC Inject 1  Device into the skin in the morning and at bedtime. 08/01/23   Shamleffer, Ibtehal Jaralla, MD  losartan  (COZAAR ) 25 MG tablet Take 1 tablet (25 mg total) by mouth daily. 01/01/24   Shamleffer, Ibtehal Jaralla, MD  pioglitazone  (ACTOS ) 30 MG tablet Take 1 tablet (30 mg total) by mouth daily. 01/01/24   Shamleffer, Ibtehal Jaralla, MD  rosuvastatin  (CRESTOR ) 40 MG tablet Take 1 tablet (40 mg total) by mouth daily. 01/01/24   Shamleffer, Ibtehal Jaralla, MD  Semaglutide ,0.25 or 0.5MG /DOS, 2 MG/3ML SOPN Inject 0.5 mg into the skin once a week. 01/01/24   Shamleffer, Ibtehal Jaralla, MD    Allergies: Patient has no known allergies.    Review of Systems  Cardiovascular:  Positive for chest pain.  All other systems reviewed and are negative.   Updated Vital Signs BP 129/71   Pulse 73   Temp 97.9 F (36.6 C)   Resp 16   SpO2 98%   Physical Exam Vitals and nursing note reviewed.  Constitutional:      General: He is not in acute distress.    Appearance: Normal appearance.  HENT:     Head: Normocephalic and atraumatic.     Mouth/Throat:     Mouth: Mucous membranes are moist.   Eyes:     Conjunctiva/sclera: Conjunctivae normal.     Pupils: Pupils are equal, round, and reactive to light.    Cardiovascular:     Rate and Rhythm: Normal rate and regular rhythm.  Pulses: Normal pulses.     Heart sounds: Normal heart sounds.  Pulmonary:     Effort: Pulmonary effort is normal.     Breath sounds: Normal breath sounds.  Chest:     Chest wall: No tenderness.  Abdominal:     Palpations: Abdomen is soft.     Tenderness: There is no abdominal tenderness.   Musculoskeletal:        General: Normal range of motion.     Cervical back: Normal range of motion.   Skin:    General: Skin is warm and dry.     Findings: No rash.   Neurological:     General: No focal deficit present.     Mental Status: He is alert.   Psychiatric:        Mood and Affect: Mood normal.        Behavior:  Behavior normal.    (all labs ordered are listed, but only abnormal results are displayed) Labs Reviewed  BASIC METABOLIC PANEL WITH GFR - Abnormal; Notable for the following components:      Result Value   Glucose, Bld 139 (*)    BUN 42 (*)    Creatinine, Ser 2.03 (*)    GFR, Estimated 35 (*)    All other components within normal limits  CBC - Abnormal; Notable for the following components:   RBC 6.10 (*)    Hemoglobin 17.5 (*)    HCT 55.0 (*)    All other components within normal limits  TROPONIN I (HIGH SENSITIVITY)  TROPONIN I (HIGH SENSITIVITY)    EKG: None  Radiology: DG Chest 2 View Result Date: 01/05/2024 CLINICAL DATA:  Chest pains. EXAM: CHEST - 2 VIEW COMPARISON:  PA Lat chest 04/20/2023, chest CT 09/27/2023 FINDINGS: The lungs are mildly emphysematous. There are mild chronic interstitial changes. No focal pneumonia is seen. The sulci are sharp. The cardiomediastinal silhouette is normal with calcification in the transverse aorta. Multilevel thoracic spine bridging enthesopathy is present. The bones are mildly demineralized. IMPRESSION: 1. No evidence of acute chest disease. 2. Emphysema and chronic interstitial changes.  Stable COPD chest. 3. Aortic atherosclerosis. Electronically Signed   By: Francis Quam M.D.   On: 01/05/2024 03:05     Procedures   Medications Ordered in the ED  dexamethasone  (DECADRON ) injection 10 mg (10 mg Intramuscular Given 01/05/24 0656)                                    Medical Decision Making Amount and/or Complexity of Data Reviewed Labs: ordered. Radiology: ordered.   This patient presents to the ED for concern of chest pain, this involves an extensive number of treatment options, and is a complaint that carries with it a high risk of complications and morbidity.   Differential diagnosis includes: ACS, costochondritis, pleurisy, muscle strain, etc. Low suspicion for PE - no tachycardia, tachypnea, or recents  surgeries/hospitalizations/long travels.   Comorbidities  See HPI above   Additional History  Additional history obtained from prior records   Cardiac Monitoring / EKG  The patient was maintained on a cardiac monitor.  I personally viewed and interpreted the cardiac monitored which showed: sinus rhythm with a heart rate of 70 bpm.   Lab Tests  I ordered and personally interpreted labs.  The pertinent results include:   Initial troponin of 7, delta troponin of 8 BMP and CBC are within normal limits for  patient   Imaging Studies  I ordered imaging studies including CXR  I independently visualized and interpreted imaging which showed:  No evidence of acute chest disease Emphysema and chronic interstitial changes - stable COPD chest. I agree with the radiologist interpretation   Problem List / ED Course / Critical Interventions / Medication Management  Patient reports intermittent left sided chest pain for the past several weeks that comes and goes on its own. It is not reproducible in nature.  Denies chest discomfort at the time of my evaluation. No associated shortness of breath. No recent surgeries, hospitalizations, or long travels. No history of coagulopathy - low suspicion for for pulmonary embolism. No ripping or tearing pain, no back pain or current chest pain - low suspicion for aortic dissection. He also has chronic back pain and knee pain. Reports that he has had a pain shot here in the past but doesn't know what it was specifically, thinks it was a steroid. Discussed with patient that steroids can increase his blood sugar and that he would need to monitor his levels closely. He states that he has a glucometer and will monitor his levels.  I ordered medications including: Decadron  for back pain given prior to discharge Patient was seeing Dr. Burnard with [facility], but has not followed up in several years. Referral sent so patient can follow up outpatient for his  intermittent chest pain.    Social Determinants of Health  Tobacco use   Test / Admission - Considered  Discussed findings with patient. All questions answered. He is stable and safe for discharge home. Return precautions provided.    Final diagnoses:  Atypical chest pain    ED Discharge Orders          Ordered    Ambulatory referral to Cardiology       Comments: If you have not heard from the Cardiology office within the next 72 hours please call [phone].   01/05/24 0700               Waddell Sluder, PA-C 01/05/24 9281    Bernard Drivers, MD 01/05/24 1109

## 2024-01-10 ENCOUNTER — Telehealth: Payer: Self-pay | Admitting: Physical Medicine and Rehabilitation

## 2024-01-10 NOTE — Telephone Encounter (Signed)
 Pt request an appointment for another back injection

## 2024-01-19 ENCOUNTER — Telehealth: Payer: Self-pay | Admitting: Physical Medicine and Rehabilitation

## 2024-01-19 ENCOUNTER — Telehealth: Payer: Self-pay

## 2024-01-19 DIAGNOSIS — Z981 Arthrodesis status: Secondary | ICD-10-CM

## 2024-01-19 DIAGNOSIS — M5416 Radiculopathy, lumbar region: Secondary | ICD-10-CM

## 2024-01-19 NOTE — Telephone Encounter (Signed)
 Ryan Farmer

## 2024-01-19 NOTE — Telephone Encounter (Signed)
 Pt came in office stating he thought he had an upcoming appt with [facility]. Please call pt after lunch about this matter. Pt phone number is [phone].

## 2024-01-29 ENCOUNTER — Encounter: Payer: Self-pay | Admitting: Internal Medicine

## 2024-01-29 ENCOUNTER — Other Ambulatory Visit (HOSPITAL_COMMUNITY): Payer: Self-pay

## 2024-01-29 ENCOUNTER — Telehealth: Payer: Self-pay | Admitting: Nutrition

## 2024-01-29 ENCOUNTER — Other Ambulatory Visit: Payer: Self-pay

## 2024-01-29 MED ORDER — DEXCOM G7 SENSOR MISC
3 refills | Status: DC
  Filled 2024-01-29 – 2024-02-05 (×5): qty 9, 90d supply, fill #0

## 2024-01-29 MED ORDER — OMNIPOD 5 G7 PODS (GEN 5) MISC
1.0000 | 3 refills | Status: DC
  Filled 2024-01-29 – 2024-01-30 (×3): qty 45, 90d supply, fill #0

## 2024-01-29 NOTE — Telephone Encounter (Signed)
 Patient called asking that his prescription for pods and dexcom sensors be sent to [facility] pharmacy so that he can get them mailed to him to save him gas money.   Also patient saying his blood sugars are always in the 200s and 300s and that he doesn't have an appointment with Dr. Sam until 03/26/24.  He is using U 100 Humalog , but occasionally uses the U-200 Humalog  pens when he is out of vials-before his prescription can be given to him.  He denies any lows at that time. [employer] and put on Dr. Kris desk

## 2024-01-30 ENCOUNTER — Other Ambulatory Visit (HOSPITAL_COMMUNITY): Payer: Self-pay

## 2024-01-30 ENCOUNTER — Telehealth: Payer: Self-pay | Admitting: Nutrition

## 2024-01-30 ENCOUNTER — Other Ambulatory Visit: Payer: Self-pay

## 2024-01-30 MED ORDER — INSULIN LISPRO 100 UNIT/ML IJ SOLN
100.0000 [IU] | Freq: Every day | INTRAMUSCULAR | 3 refills | Status: DC
  Filled 2024-01-30: qty 100, 100d supply, fill #0

## 2024-01-30 NOTE — Telephone Encounter (Signed)
 Called patient to see if sister can come over to help him make the changes to his PDM.  He reports that she is in chemo until 11 AM today. At 12PM patient called saying that his sister is too sick to come today, but hat he can drive if he has enough gas later today.  I called him back and left a message that I only work until noon but that I can see him tomorrow if his sister can come over, or if he can come here.  Times given for when he can come her or she can call to make the changes.

## 2024-01-30 NOTE — Telephone Encounter (Signed)
 LVM to call me to see if we can meet today,or if his sister is feeling better to come to his house to make changes to his PDM.

## 2024-01-30 NOTE — Telephone Encounter (Signed)
 See phone conversation.  Have not been able to talk with sister to help him make the changes to his PDM, and patient was not able to come here to make the changes to his PDM.  Will try today to contact him about coming here or talking to his sister.

## 2024-01-31 NOTE — Telephone Encounter (Signed)
 Phoned patient and he said he came to office Monday afternoon, but because I was with patients until 5:30 PM, I did not see the message, and he left office before then.  He reports that he is watching his diet now and blood sugars have come down and he is doing much better.  I told him that if his sister comes to see him tomorrow to call here and Leita can speak with her and tell her how to make the changes.  He said no, that he does not need any changes now.  He will wait until his appointment with Dr. Sam

## 2024-01-31 NOTE — Telephone Encounter (Signed)
 See above note

## 2024-02-02 ENCOUNTER — Encounter (HOSPITAL_COMMUNITY): Payer: Self-pay | Admitting: Emergency Medicine

## 2024-02-02 ENCOUNTER — Ambulatory Visit (HOSPITAL_COMMUNITY)
Admission: EM | Admit: 2024-02-02 | Discharge: 2024-02-02 | Disposition: A | Discharge status: Home / Self Care | Attending: Physician Assistant | Admitting: Physician Assistant

## 2024-02-02 DIAGNOSIS — L729 Follicular cyst of the skin and subcutaneous tissue, unspecified: Secondary | ICD-10-CM | POA: Diagnosis not present

## 2024-02-02 DIAGNOSIS — N3001 Acute cystitis with hematuria: Secondary | ICD-10-CM | POA: Diagnosis present

## 2024-02-02 DIAGNOSIS — M545 Low back pain, unspecified: Secondary | ICD-10-CM | POA: Diagnosis present

## 2024-02-02 LAB — POCT URINALYSIS DIP (MANUAL ENTRY)
Bilirubin, UA: NEGATIVE
Glucose, UA: >=1000 mg/dL — AB
Ketones, POC UA: NEGATIVE mg/dL
Nitrite, UA: NEGATIVE
Protein Ur, POC: 100 mg/dL — AB
Spec Grav, UA: 1.015 (ref 1.010–1.025)
Urobilinogen, UA: 0.2 U/dL
pH, UA: 5 (ref 5.0–8.0)

## 2024-02-02 MED ORDER — LIDOCAINE 5 % EX PTCH
1.0000 | MEDICATED_PATCH | CUTANEOUS | 0 refills | Status: DC
Start: 2024-02-02 — End: 2024-03-04

## 2024-02-02 MED ORDER — CEPHALEXIN 500 MG PO CAPS
500.0000 mg | ORAL_CAPSULE | Freq: Three times a day (TID) | ORAL | 0 refills | Status: DC
Start: 2024-02-02 — End: 2024-03-04

## 2024-02-02 NOTE — ED Triage Notes (Signed)
 Pt reports had intermittent right flank pain for 3 weeks. Denies any injury, lifting or urinary problems. Reports kept him up all night.

## 2024-02-02 NOTE — ED Provider Notes (Signed)
 [facility]    CSN: [id number] Arrival date & time: 02/02/24  1104      History   Chief Complaint Chief Complaint  Patient presents with   Flank Pain    HPI Ryan Farmer is a 68 y.o. male.   Patient presents today with a 3-week history of intermittent right lower back pain.  He denies any known injury or increase in activity prior to symptom onset.  Denies any recent trauma including car accident or fall.  He does have a history of lower back pain and underwent surgical decompression several years ago.  He is concerned that it could be related to his kidney as he has a history of a single kidney.  He denies any frequency, urgency, hematuria, abdominal pain, fever, nausea, vomiting.  The pain is rated 10 on a 0-10 pain scale, described as aching, no aggravating alleviating factors identified.  He is scheduled to see his pain management provider for intra facet injections next month but wants to make sure there was nothing else going on.    Past Medical History:  Diagnosis Date   Arthritis    All over   CAD (coronary artery disease), native coronary artery, minimal by cardiac cath 01/08/19 01/09/2019   Chronic pain syndrome    Diabetes mellitus    History of cocaine  use    History of kidney stones    Hypercholesteremia    Hypertension    Low back pain    Lumbar radiculopathy    Right leg pain    Seborrheic dermatitis of scalp    Shingles     Patient Active Problem List   Diagnosis Date Noted   Long term current use of oral hypoglycemic drug 03/16/2023   Dyslipidemia 03/16/2023   Type 2 diabetes mellitus with stage 3b chronic kidney disease, with long-term current use of insulin  (HCC) 03/16/2023   Renal mass 05/13/2022   Spinal stenosis of lumbosacral region 04/21/2021   Spondylolisthesis, lumbar region    Palmar wart 04/05/2021   Acute non-recurrent maxillary sinusitis 04/05/2021   Type 2 diabetes mellitus with hyperglycemia, with long-term current use of  insulin  (HCC) 08/20/2020   Type 2 diabetes mellitus with microalbuminuria, with long-term current use of insulin  (HCC) 10/14/2019   Type 2 diabetes mellitus with diabetic polyneuropathy, with long-term current use of insulin  (HCC) 04/15/2019   Hypertriglyceridemia 04/15/2019   Spinal stenosis of lumbar region with neurogenic claudication 03/07/2019   Lumbar radiculopathy 03/07/2019   Chronic bilateral low back pain with bilateral sciatica 03/07/2019   CAD (coronary artery disease), native coronary artery, minimal by cardiac cath 01/08/19 01/09/2019   Syncope and collapse    Spondylosis without myelopathy or radiculopathy, lumbar region 04/26/2018   Cocaine  abuse (HCC) 12/19/2016   Shingles outbreak 12/13/2016   Diabetes type 2, uncontrolled 04/19/2016   Metatarsalgia 10/19/2015   Back muscle spasm 11/18/2013   Hyperlipidemia 11/18/2013   HTN (hypertension) 11/22/2012   Chronic pain syndrome 11/22/2012   Tobacco use disorder 11/22/2012   Dental caries 11/22/2012    Past Surgical History:  Procedure Laterality Date   CIRCUMCISION N/A 03/16/2018   Procedure: CIRCUMCISION ADULT WITH PENILE BLOCK;  Surgeon: Alvaro Hummer, MD;  Location: [facility];  Service: Urology;  Laterality: N/A;  45 MINS   KNEE ARTHROSCOPY Bilateral    LEFT HEART CATH AND CORONARY ANGIOGRAPHY N/A 01/08/2019   Procedure: LEFT HEART CATH AND CORONARY ANGIOGRAPHY;  Surgeon: Burnard Debby LABOR, MD;  Location: [facility];  Service: Cardiovascular;  Laterality: N/A;   LYMPH NODE DISSECTION Left 05/13/2022   Procedure: RETROPERITONEAL LYMPH NODE DISSECTIONLYMPH NODE DISSECTION;  Surgeon: Alvaro Hummer, MD;  Location: [facility];  Service: Urology;  Laterality: Left;   ROBOT ASSISTED LAPAROSCOPIC NEPHRECTOMY Left 05/13/2022   Procedure: XI ROBOTIC ASSISTED LAPAROSCOPIC NEPHRECTOMY;  Surgeon: Alvaro Hummer, MD;  Location: [facility];  Service: Urology;  Laterality: Left;  3 HRS       Home Medications     Prior to Admission medications   Medication Sig Start Date End Date Taking? Authorizing Provider  cephALEXin  (KEFLEX ) 500 MG capsule Take 1 capsule (500 mg total) by mouth 3 (three) times daily. 02/02/24  Yes Nesa Distel K, PA-C  lidocaine  (LIDODERM ) 5 % Place 1 patch onto the skin daily. Remove & Discard patch within 12 hours or as directed by MD 02/02/24  Yes Gimena Buick, Rocky K, PA-C  acetaminophen  (TYLENOL ) 650 MG CR tablet Take 2-3 tablets by mouth daily as needed for pain. Patient not taking: Reported on 01/01/2024    [provider]  Continuous Glucose Sensor (DEXCOM G7 SENSOR) MISC Change sensor every 10 days 01/29/24   Shamleffer, Ibtehal Jaralla, MD  dapagliflozin  propanediol (FARXIGA ) 10 MG TABS tablet Take 1 tablet (10 mg total) by mouth daily. 01/01/24   Shamleffer, Ibtehal Jaralla, MD  glucose blood (ACCU-CHEK GUIDE TEST) test strip USE THREE TIMES DAILY AS DIRECTED 07/06/23   Shamleffer, Donell Cardinal, MD  Insulin  Disposable Pump (OMNIPOD 5 G7 PODS, GEN 5,) MISC Apply as directed every other day. 01/29/24   Shamleffer, Ibtehal Jaralla, MD  insulin  lispro (HUMALOG ) 100 UNIT/ML injection Max daily 100 units 01/01/24   Shamleffer, Ibtehal Jaralla, MD  insulin  lispro (HUMALOG ) 100 UNIT/ML injection Inject up to 1 mL (100 Units total) into the skin daily. 01/02/24   Shamleffer, Donell Cardinal, MD  Insulin  Pen Needle (BD PEN NEEDLE NANO 2ND GEN) 32G X 4 MM MISC Inject 1 Device into the skin in the morning and at bedtime. 08/01/23   Shamleffer, Ibtehal Jaralla, MD  losartan  (COZAAR ) 25 MG tablet Take 1 tablet (25 mg total) by mouth daily. 01/01/24   Shamleffer, Ibtehal Jaralla, MD  pioglitazone  (ACTOS ) 30 MG tablet Take 1 tablet (30 mg total) by mouth daily. 01/01/24   Shamleffer, Ibtehal Jaralla, MD  rosuvastatin  (CRESTOR ) 40 MG tablet Take 1 tablet (40 mg total) by mouth daily. 01/01/24   Shamleffer, Ibtehal Jaralla, MD  Semaglutide ,0.25 or 0.5MG /DOS, 2 MG/3ML SOPN Inject 0.5 mg into the  skin once a week. 01/01/24   Shamleffer, Donell Cardinal, MD    Family History Family History  Problem Relation Age of Onset   Cancer Mother    Stroke Father    Heart disease Father    Diabetes Father     Social History Social History   Tobacco Use   Smoking status: Every Day    Current packs/day: 1.00    Average packs/day: 1 pack/day for 50.0 years (50.0 ttl pk-yrs)    Types: Cigarettes   Smokeless tobacco: Former    Types: Engineer, drilling   Vaping status: Never Used  Substance Use Topics   Alcohol use: Not Currently    Comment: occ   Drug use: Not Currently    Types: Marijuana, Cocaine     Comment: twice a week     Allergies   Patient has no known allergies.   Review of Systems Review of Systems  Constitutional:  Negative for activity change, appetite change, fatigue and fever.  Gastrointestinal:  Negative for abdominal pain, diarrhea, nausea and vomiting.  Genitourinary:  Negative for difficulty urinating, dysuria, enuresis, flank pain, frequency, hematuria and urgency.  Musculoskeletal:  Positive for back pain. Negative for arthralgias and myalgias.     Physical Exam Triage Vital Signs ED Triage Vitals [02/02/24 1146]  Encounter Vitals Group     BP 136/87     Girls Systolic BP Percentile      Girls Diastolic BP Percentile      Boys Systolic BP Percentile      Boys Diastolic BP Percentile      Pulse Rate 85     Resp 19     Temp 97.7 F (36.5 C)     Temp Source Oral     SpO2 94 %     Weight      Height      Head Circumference      Peak Flow      Pain Score 10     Pain Loc      Pain Education      Exclude from Growth Chart    No data found.  Updated Vital Signs BP 136/87 (BP Location: Right Arm)   Pulse 85   Temp 97.7 F (36.5 C) (Oral)   Resp 19   SpO2 94%   Visual Acuity Right Eye Distance:   Left Eye Distance:   Bilateral Distance:    Right Eye Near:   Left Eye Near:    Bilateral Near:     Physical Exam Vitals reviewed.   Constitutional:      General: He is awake.     Appearance: Normal appearance. He is well-developed. He is not ill-appearing.     Comments: Very pleasant male appears stated age in no acute distress sitting comfortably in exam room  HENT:     Head: Normocephalic and atraumatic.     Mouth/Throat:     Pharynx: No oropharyngeal exudate, posterior oropharyngeal erythema or uvula swelling.  Cardiovascular:     Rate and Rhythm: Normal rate and regular rhythm.     Heart sounds: Normal heart sounds, S1 normal and S2 normal. No murmur heard. Pulmonary:     Effort: Pulmonary effort is normal.     Breath sounds: Normal breath sounds. No stridor. No wheezing, rhonchi or rales.     Comments: Clear to auscultation bilaterally Abdominal:     General: Bowel sounds are normal.     Palpations: Abdomen is soft.     Tenderness: There is no abdominal tenderness. There is no right CVA tenderness, left CVA tenderness, guarding or rebound.     Comments: Benign abdominal exam  Musculoskeletal:     Cervical back: No tenderness or bony tenderness.     Thoracic back: No tenderness or bony tenderness.     Lumbar back: Tenderness present. No bony tenderness. Negative right straight leg raise test and negative left straight leg raise test.     Comments: No pain percussion of vertebrae.  Mild tender to palpation of right lumbar paraspinal muscles.  Negative straight leg raise.  Strength 5/5 bilateral lower extremities.  Skin:        Comments: Multiple skin cyst noted back.  No evidence of abscess or significant erythema.  Neurological:     Mental Status: He is alert.  Psychiatric:        Behavior: Behavior is cooperative.      UC Treatments / Results  Labs (all labs ordered are listed, but only abnormal results are  displayed) Labs Reviewed  POCT URINALYSIS DIP (MANUAL ENTRY) - Abnormal; Notable for the following components:      Result Value   Clarity, UA cloudy (*)    Glucose, UA >=1,000 (*)    Blood,  UA small (*)    Protein Ur, POC =100 (*)    Leukocytes, UA Trace (*)    All other components within normal limits  URINE CULTURE    EKG   Radiology No results found.  Procedures Procedures (including critical care time)  Medications Ordered in UC Medications - No data to display  Initial Impression / Assessment and Plan / UC Course  I have reviewed the triage vital signs and the nursing notes.  Pertinent labs & imaging results that were available during my care of the patient were reviewed by me and considered in my medical decision making (see chart for details).     Patient is well-appearing, afebrile, nontoxic, nontachycardic.  Vital signs and physical exam are reassuring with no indication for emergent evaluation or imaging.  UA did show glucosuria consistent with SGLT2 use, however, he did have leukocytes and some blood.  Will treat for UTI with cephalexin  3 times daily for 1 week.  No indication for dose adjustment based on metabolic panel from 01/05/2024 with creatinine of 2.03 and calculated creatinine clearance of 53.51 mL/min.  We will send this for culture and contact him if we need to change or stop his antibiotics based on culture results.  We discussed symptoms could also be musculoskeletal in nature and so he was given lidocaine  patch to apply during the day and then remove this at night with instruction to use only 1 patch per 24 hours.  He can use Tylenol  as well as heat and gentle stretch for additional symptom relief.  He is established with pain management and encouraged to follow-up with them to consider intra facet injections if his symptoms persist.  We discussed that if he has any worsening or changing symptoms including fever, nausea, vomiting, worsening pain, difficulty urinating, hematuria needs to be seen emergently.  Strict return precautions given.  All questions answered to patient satisfaction.  Multiple skin cyst were noted on exam.  Patient requested that  we drain these but they did not appear to be infected/abscessed on exam and so discussed that I recommended that he consider seeing a dermatologist for cyst removal.  He was given the contact information for local provider with instruction to call to schedule an appointment.  We discussed that if he has any signs of infection he should return for reevaluation including redness, pain, swelling, enlarging lesions.  Final Clinical Impressions(s) / UC Diagnoses   Final diagnoses:  Acute right-sided low back pain without sciatica  Acute cystitis with hematuria  Cyst of skin     Discharge Instructions      Dr. Lyda number is [phone]  We are treating you for urinary tract infection.  Please start cephalexin  3 times daily.  Make sure you drink plenty of fluid.  It is also possible that your pain is related to muscle injury.  Apply lidocaine  patch during the day and then remove this at night; use only 1 patch per 24 hours.  You can use Tylenol  for additional pain relief as well as heat and gentle stretch.  We will contact you if need to change or stop your antibiotics based on your culture results.  Follow-up with your primary care soon as possible; you will need to have a repeat urine  in 2 to 4 weeks to ensure that the blood we noted today goes away with treating the infection.  If anything worsens you have increasing pain, weakness in your legs, going to the bathroom and stop without noticing it, fever, nausea, vomiting you need to go to the emergency room.  Call and schedule an appointment with dermatology about skin cyst.     ED Prescriptions     Medication Sig Dispense Auth. Provider   cephALEXin  (KEFLEX ) 500 MG capsule Take 1 capsule (500 mg total) by mouth 3 (three) times daily. 21 capsule Cyrah Mclamb K, PA-C   lidocaine  (LIDODERM ) 5 % Place 1 patch onto the skin daily. Remove & Discard patch within 12 hours or as directed by MD 7 patch Umeka Wrench K, PA-C      PDMP not  reviewed this encounter.   Sherrell Rocky POUR, PA-C 02/02/24 1231

## 2024-02-02 NOTE — Discharge Instructions (Addendum)
 Dr. Lyda number is [phone]  We are treating you for urinary tract infection.  Please start cephalexin  3 times daily.  Make sure you drink plenty of fluid.  It is also possible that your pain is related to muscle injury.  Apply lidocaine  patch during the day and then remove this at night; use only 1 patch per 24 hours.  You can use Tylenol  for additional pain relief as well as heat and gentle stretch.  We will contact you if need to change or stop your antibiotics based on your culture results.  Follow-up with your primary care soon as possible; you will need to have a repeat urine in 2 to 4 weeks to ensure that the blood we noted today goes away with treating the infection.  If anything worsens you have increasing pain, weakness in your legs, going to the bathroom and stop without noticing it, fever, nausea, vomiting you need to go to the emergency room.  Call and schedule an appointment with dermatology about skin cyst.

## 2024-02-04 LAB — URINE CULTURE: Culture: >=100000 — AB

## 2024-02-05 ENCOUNTER — Ambulatory Visit (HOSPITAL_COMMUNITY): Payer: Self-pay

## 2024-02-05 ENCOUNTER — Other Ambulatory Visit: Payer: Self-pay

## 2024-02-19 ENCOUNTER — Ambulatory Visit: Admitting: Physical Medicine and Rehabilitation

## 2024-02-19 ENCOUNTER — Other Ambulatory Visit: Payer: Self-pay

## 2024-02-19 VITALS — BP 146/95 | HR 132

## 2024-02-19 DIAGNOSIS — Z981 Arthrodesis status: Secondary | ICD-10-CM

## 2024-02-19 DIAGNOSIS — M5416 Radiculopathy, lumbar region: Secondary | ICD-10-CM

## 2024-02-19 MED ORDER — METHYLPREDNISOLONE ACETATE 40 MG/ML IJ SUSP
40.0000 mg | Freq: Once | INTRAMUSCULAR | Status: AC
  Administered 2024-02-19: 40 mg

## 2024-02-19 NOTE — Patient Instructions (Signed)

## 2024-02-19 NOTE — Progress Notes (Signed)
 Pain Scale   Average Pain 10 Patient advising he has chronic lower back pain radiating to both legs, without relief.        +Driver, -BT, -Dye Allergies.

## 2024-02-25 NOTE — Procedures (Signed)
 Lumbar Epidural Steroid Injection - Interlaminar Approach with Fluoroscopic Guidance  Patient: Ryan Farmer      Date of Birth: [birth year] MRN: [id number] PCP: System, Provider Not In      Visit Date: 02/19/2024   Universal Protocol:     Consent Given By: the patient  Position: PRONE  Additional Comments: Vital signs were monitored before and after the procedure. Patient was prepped and draped in the usual sterile fashion. The correct patient, procedure, and site was verified.   Injection Procedure Details:   Procedure diagnoses: Lumbar radiculopathy [M54.16]   Meds Administered:  Meds ordered this encounter  Medications   methylPREDNISolone  acetate (DEPO-MEDROL ) injection 40 mg     Laterality: Right  Location/Site:  L2-3  Needle: 3.5 in., 20 ga. Tuohy  Needle Placement: Paramedian epidural  Findings:   -Comments: Excellent flow of contrast into the epidural space.  Procedure Details: Using a paramedian approach from the side mentioned above, the region overlying the inferior lamina was localized under fluoroscopic visualization and the soft tissues overlying this structure were infiltrated with 4 ml. of 1% Lidocaine  without Epinephrine . The Tuohy needle was inserted into the epidural space using a paramedian approach.   The epidural space was localized using loss of resistance along with counter oblique bi-planar fluoroscopic views.  After negative aspirate for air, blood, and CSF, a 2 ml. volume of Isovue -250 was injected into the epidural space and the flow of contrast was observed. Radiographs were obtained for documentation purposes.    The injectate was administered into the level noted above.   Additional Comments:  The patient tolerated the procedure well Dressing: 2 x 2 sterile gauze and Band-Aid    Post-procedure details: Patient was observed during the procedure. Post-procedure instructions were reviewed.  Patient left the clinic in stable  condition.

## 2024-02-25 NOTE — Progress Notes (Signed)
 Ryan Farmer - 68 y.o. male MRN [id number]  Date of birth: 18-Mar-1956  Office Visit Note: Visit Date: 02/19/2024 PCP: System, Provider Not In Referred by: Eldonna Novel, MD  Subjective: Chief Complaint  Patient presents with   Lower Back - Pain   HPI:  Ryan Farmer is a 68 y.o. male who comes in today for planned repeat Right L2-3  Lumbar Interlaminar epidural steroid injection with fluoroscopic guidance.  The patient has failed conservative care including home exercise, medications, time and activity modification.  This injection will be diagnostic and hopefully therapeutic.  Please see requesting physician notes for further details and justification. Patient received more than 50% pain relief from prior injection. HgA1C 10.8.  Referring: Dr. Ozell Ada   ROS Otherwise per HPI.  Assessment & Plan: Visit Diagnoses:    ICD-10-CM   1. Lumbar radiculopathy  M54.16 XR C-ARM NO REPORT    Epidural Steroid injection    methylPREDNISolone  acetate (DEPO-MEDROL ) injection 40 mg    2. S/P lumbar fusion  Z98.1 XR C-ARM NO REPORT    Epidural Steroid injection    methylPREDNISolone  acetate (DEPO-MEDROL ) injection 40 mg      Plan: No additional findings.   Meds & Orders:  Meds ordered this encounter  Medications   methylPREDNISolone  acetate (DEPO-MEDROL ) injection 40 mg    Orders Placed This Encounter  Procedures   XR C-ARM NO REPORT   Epidural Steroid injection    Follow-up: Return for visit to requesting provider as needed.   Procedures: No procedures performed  Lumbar Epidural Steroid Injection - Interlaminar Approach with Fluoroscopic Guidance  Patient: Ryan Farmer      Date of Birth: 27-Jul-1955 MRN: [id number] PCP: System, Provider Not In      Visit Date: 02/19/2024   Universal Protocol:     Consent Given By: the patient  Position: PRONE  Additional Comments: Vital signs were monitored before and after the procedure. Patient was prepped and draped in  the usual sterile fashion. The correct patient, procedure, and site was verified.   Injection Procedure Details:   Procedure diagnoses: Lumbar radiculopathy [M54.16]   Meds Administered:  Meds ordered this encounter  Medications   methylPREDNISolone  acetate (DEPO-MEDROL ) injection 40 mg     Laterality: Right  Location/Site:  L2-3  Needle: 3.5 in., 20 ga. Tuohy  Needle Placement: Paramedian epidural  Findings:   -Comments: Excellent flow of contrast into the epidural space.  Procedure Details: Using a paramedian approach from the side mentioned above, the region overlying the inferior lamina was localized under fluoroscopic visualization and the soft tissues overlying this structure were infiltrated with 4 ml. of 1% Lidocaine  without Epinephrine . The Tuohy needle was inserted into the epidural space using a paramedian approach.   The epidural space was localized using loss of resistance along with counter oblique bi-planar fluoroscopic views.  After negative aspirate for air, blood, and CSF, a 2 ml. volume of Isovue -250 was injected into the epidural space and the flow of contrast was observed. Radiographs were obtained for documentation purposes.    The injectate was administered into the level noted above.   Additional Comments:  The patient tolerated the procedure well Dressing: 2 x 2 sterile gauze and Band-Aid    Post-procedure details: Patient was observed during the procedure. Post-procedure instructions were reviewed.  Patient left the clinic in stable condition.   Clinical History: CLINICAL DATA:  Low back pain that radiates into the buttocks and both legs. Bilateral foot numbness.  EXAM: MRI LUMBAR SPINE WITHOUT CONTRAST   TECHNIQUE: Multiplanar, multisequence MR imaging of the lumbar spine was performed. No intravenous contrast was administered.   COMPARISON:  03/14/2021   FINDINGS: Segmentation:  Standard.   Alignment:  Grade 1 retrolisthesis at  L2-3   Vertebrae:  No acute abnormality.  L3-4 PLIF.   Conus medullaris and cauda equina: Conus extends to the L1 level. Conus and cauda equina appear normal.   Paraspinal and other soft tissues: Negative.   Disc levels:   T12-L1: Normal.   L1-L2: Unchanged small disc bulge. No spinal canal stenosis. No neural foraminal stenosis.   L2-L3: Worsened intermediate sized disc bulge. Worsened moderate spinal canal stenosis. Unchanged mild right neural foraminal stenosis.   L3-L4: Interval PLIF with partial discectomy. Greatly improved patency of the thecal sac. Left lateral recess narrowing without central spinal canal stenosis. Limited visualization of the neural foramina due to susceptibility effects from spinal hardware, but likely mild right neural foraminal stenosis.   L4-L5: Slightly worsened small disc bulge. Narrowing of both lateral recesses without central spinal canal stenosis. No neural foraminal stenosis.   L5-S1: Small central disc protrusion, slightly worsened. No spinal canal stenosis. No neural foraminal stenosis.   Visualized sacrum: Normal.   IMPRESSION: 1. Interval L3-4 PLIF with partial discectomy. Greatly improved patency of the thecal sac at this level. Left lateral recess narrowing and mild right neural foraminal stenosis. 2. Worsened moderate spinal canal stenosis at L2-3 with unchanged mild right neural foraminal stenosis. 3. Slight worsening of L4-5 disc bulge with bilateral lateral recess narrowing. 4. Slight worsening of small central disc protrusion at L5-S1 without spinal canal or neural foraminal stenosis.     Electronically Signed   By: Franky Stanford M.D.   On: 02/23/2023 17:49     Objective:  VS:  HT:    WT:   BMI:     BP:(!) 146/95  HR:(!) 132bpm  TEMP: ( )  RESP:  Physical Exam Vitals and nursing note reviewed.  Constitutional:      General: He is not in acute distress.    Appearance: Normal appearance. He is not  ill-appearing.  HENT:     Head: Normocephalic and atraumatic.     Right Ear: External ear normal.     Left Ear: External ear normal.     Nose: No congestion.  Eyes:     Extraocular Movements: Extraocular movements intact.  Cardiovascular:     Rate and Rhythm: Normal rate.     Pulses: Normal pulses.  Pulmonary:     Effort: Pulmonary effort is normal. No respiratory distress.  Abdominal:     General: There is no distension.     Palpations: Abdomen is soft.  Musculoskeletal:        General: No tenderness or signs of injury.     Cervical back: Neck supple.     Right lower leg: No edema.     Left lower leg: No edema.     Comments: Patient has good distal strength without clonus.  Skin:    Findings: No erythema or rash.  Neurological:     General: No focal deficit present.     Mental Status: He is alert and oriented to person, place, and time.     Sensory: No sensory deficit.     Motor: No weakness or abnormal muscle tone.     Coordination: Coordination normal.  Psychiatric:        Mood and Affect: Mood normal.  Behavior: Behavior normal.      Imaging: No results found.

## 2024-03-04 ENCOUNTER — Ambulatory Visit (INDEPENDENT_AMBULATORY_CARE_PROVIDER_SITE_OTHER)

## 2024-03-04 ENCOUNTER — Encounter (HOSPITAL_COMMUNITY): Payer: Self-pay

## 2024-03-04 ENCOUNTER — Ambulatory Visit (HOSPITAL_COMMUNITY)
Admission: EM | Admit: 2024-03-04 | Discharge: 2024-03-04 | Disposition: A | Discharge status: Home / Self Care | Attending: Family Medicine | Admitting: Family Medicine

## 2024-03-04 DIAGNOSIS — S322XXA Fracture of coccyx, initial encounter for closed fracture: Secondary | ICD-10-CM | POA: Diagnosis not present

## 2024-03-04 DIAGNOSIS — S3210XA Unspecified fracture of sacrum, initial encounter for closed fracture: Secondary | ICD-10-CM

## 2024-03-04 DIAGNOSIS — M533 Sacrococcygeal disorders, not elsewhere classified: Secondary | ICD-10-CM | POA: Diagnosis not present

## 2024-03-04 MED ORDER — HYDROCODONE-ACETAMINOPHEN 5-325 MG PO TABS: 2.0000 | ORAL_TABLET | Freq: Four times a day (QID) | ORAL | 0 refills | Status: DC | PRN

## 2024-03-04 MED ORDER — LIDOCAINE 5 % EX PTCH: 1.0000 | MEDICATED_PATCH | CUTANEOUS | 0 refills | Status: AC

## 2024-03-04 NOTE — ED Provider Notes (Signed)
 [facility]    CSN: [id number] Arrival date & time: 03/04/24  9176      History   Chief Complaint Chief Complaint  Patient presents with   Fall    HPI AIVAN FILLINGIM is a 68 y.o. male.    Fall  Patient is here for a fall.  He was sitting on a wheeled walker.  It rolled back, and he fell out and landed on a root outside.  Having tailbone pain, esp with sitting.  He is walking okay.  No pain down his legs or numbness/tingling.  He has used otc meds, icing it, but not helping.  He is requesting something for pain.  Only has 1 kidney, elevated CR.        Past Medical History:  Diagnosis Date   Arthritis    All over   CAD (coronary artery disease), native coronary artery, minimal by cardiac cath 01/08/19 01/09/2019   Chronic pain syndrome    Diabetes mellitus    History of cocaine  use    History of kidney stones    Hypercholesteremia    Hypertension    Low back pain    Lumbar radiculopathy    Right leg pain    Seborrheic dermatitis of scalp    Shingles     Patient Active Problem List   Diagnosis Date Noted   Long term current use of oral hypoglycemic drug 03/16/2023   Dyslipidemia 03/16/2023   Type 2 diabetes mellitus with stage 3b chronic kidney disease, with long-term current use of insulin  (HCC) 03/16/2023   Renal mass 05/13/2022   Spinal stenosis of lumbosacral region 04/21/2021   Spondylolisthesis, lumbar region    Palmar wart 04/05/2021   Acute non-recurrent maxillary sinusitis 04/05/2021   Type 2 diabetes mellitus with hyperglycemia, with long-term current use of insulin  (HCC) 08/20/2020   Type 2 diabetes mellitus with microalbuminuria, with long-term current use of insulin  (HCC) 10/14/2019   Type 2 diabetes mellitus with diabetic polyneuropathy, with long-term current use of insulin  (HCC) 04/15/2019   Hypertriglyceridemia 04/15/2019   Spinal stenosis of lumbar region with neurogenic claudication 03/07/2019   Lumbar radiculopathy  03/07/2019   Chronic bilateral low back pain with bilateral sciatica 03/07/2019   CAD (coronary artery disease), native coronary artery, minimal by cardiac cath 01/08/19 01/09/2019   Syncope and collapse    Spondylosis without myelopathy or radiculopathy, lumbar region 04/26/2018   Cocaine  abuse (HCC) 12/19/2016   Shingles outbreak 12/13/2016   Diabetes type 2, uncontrolled 04/19/2016   Metatarsalgia 10/19/2015   Back muscle spasm 11/18/2013   Hyperlipidemia 11/18/2013   HTN (hypertension) 11/22/2012   Chronic pain syndrome 11/22/2012   Tobacco use disorder 11/22/2012   Dental caries 11/22/2012    Past Surgical History:  Procedure Laterality Date   CIRCUMCISION N/A 03/16/2018   Procedure: CIRCUMCISION ADULT WITH PENILE BLOCK;  Surgeon: Alvaro Hummer, MD;  Location: [facility];  Service: Urology;  Laterality: N/A;  45 MINS   KNEE ARTHROSCOPY Bilateral    LEFT HEART CATH AND CORONARY ANGIOGRAPHY N/A 01/08/2019   Procedure: LEFT HEART CATH AND CORONARY ANGIOGRAPHY;  Surgeon: Burnard Debby LABOR, MD;  Location: [facility];  Service: Cardiovascular;  Laterality: N/A;   LYMPH NODE DISSECTION Left 05/13/2022   Procedure: RETROPERITONEAL LYMPH NODE DISSECTIONLYMPH NODE DISSECTION;  Surgeon: Alvaro Hummer, MD;  Location: [facility];  Service: Urology;  Laterality: Left;   ROBOT ASSISTED LAPAROSCOPIC NEPHRECTOMY Left 05/13/2022   Procedure: XI ROBOTIC ASSISTED LAPAROSCOPIC NEPHRECTOMY;  Surgeon:  Alvaro Hummer, MD;  Location: [facility];  Service: Urology;  Laterality: Left;  3 HRS       Home Medications    Prior to Admission medications   Medication Sig Start Date End Date Taking? Authorizing Provider  acetaminophen  (TYLENOL ) 650 MG CR tablet Take 2-3 tablets by mouth daily as needed for pain.    [provider]  Continuous Glucose Sensor (DEXCOM G7 SENSOR) MISC Change sensor every 10 days 01/29/24   Shamleffer, Ibtehal Jaralla, MD  dapagliflozin  propanediol (FARXIGA )  10 MG TABS tablet Take 1 tablet (10 mg total) by mouth daily. 01/01/24   Shamleffer, Ibtehal Jaralla, MD  glucose blood (ACCU-CHEK GUIDE TEST) test strip USE THREE TIMES DAILY AS DIRECTED 07/06/23   Shamleffer, Donell Cardinal, MD  Insulin  Disposable Pump (OMNIPOD 5 G7 PODS, GEN 5,) MISC Apply as directed every other day. 01/29/24   Shamleffer, Ibtehal Jaralla, MD  insulin  lispro (HUMALOG ) 100 UNIT/ML injection Max daily 100 units 01/01/24   Shamleffer, Donell Cardinal, MD  insulin  lispro (HUMALOG ) 100 UNIT/ML injection Inject up to 1 mL (100 Units total) into the skin daily. 01/02/24   Shamleffer, Donell Cardinal, MD  Insulin  Pen Needle (BD PEN NEEDLE NANO 2ND GEN) 32G X 4 MM MISC Inject 1 Device into the skin in the morning and at bedtime. 08/01/23   Shamleffer, Ibtehal Jaralla, MD  lidocaine  (LIDODERM ) 5 % Place 1 patch onto the skin daily. Remove & Discard patch within 12 hours or as directed by MD 02/02/24   Raspet, Rocky POUR, PA-C  losartan  (COZAAR ) 25 MG tablet Take 1 tablet (25 mg total) by mouth daily. 01/01/24   Shamleffer, Ibtehal Jaralla, MD  pioglitazone  (ACTOS ) 30 MG tablet Take 1 tablet (30 mg total) by mouth daily. 01/01/24   Shamleffer, Ibtehal Jaralla, MD  rosuvastatin  (CRESTOR ) 40 MG tablet Take 1 tablet (40 mg total) by mouth daily. 01/01/24   Shamleffer, Ibtehal Jaralla, MD  Semaglutide ,0.25 or 0.5MG /DOS, 2 MG/3ML SOPN Inject 0.5 mg into the skin once a week. 01/01/24   Shamleffer, Donell Cardinal, MD    Family History Family History  Problem Relation Age of Onset   Cancer Mother    Stroke Father    Heart disease Father    Diabetes Father     Social History Social History   Tobacco Use   Smoking status: Every Day    Current packs/day: 1.00    Average packs/day: 1 pack/day for 50.0 years (50.0 ttl pk-yrs)    Types: Cigarettes   Smokeless tobacco: Former    Types: Engineer, drilling   Vaping status: Never Used  Substance Use Topics   Alcohol use: Not Currently    Comment: occ    Drug use: Not Currently    Types: Marijuana, Cocaine     Comment: twice a week     Allergies   Patient has no known allergies.   Review of Systems Review of Systems  Constitutional: Negative.   HENT: Negative.    Respiratory: Negative.    Cardiovascular: Negative.   Musculoskeletal:  Positive for back pain.     Physical Exam Triage Vital Signs ED Triage Vitals  Encounter Vitals Group     BP 03/04/24 0849 (!) 142/88     Girls Systolic BP Percentile --      Girls Diastolic BP Percentile --      Boys Systolic BP Percentile --      Boys Diastolic BP Percentile --      Pulse Rate 03/04/24 0849  87     Resp 03/04/24 0849 16     Temp 03/04/24 0849 97.6 F (36.4 C)     Temp Source 03/04/24 0849 Oral     SpO2 03/04/24 0849 96 %     Weight --      Height --      Head Circumference --      Peak Flow --      Pain Score 03/04/24 0850 10     Pain Loc --      Pain Education --      Exclude from Growth Chart --    No data found.  Updated Vital Signs BP (!) 142/88 (BP Location: Right Arm)   Pulse 87   Temp 97.6 F (36.4 C) (Oral)   Resp 16   SpO2 96%   Visual Acuity Right Eye Distance:   Left Eye Distance:   Bilateral Distance:    Right Eye Near:   Left Eye Near:    Bilateral Near:     Physical Exam Constitutional:      Appearance: Normal appearance. He is normal weight.  Musculoskeletal:     Comments: No obvious ecchymosis noted;  Very TTP to the lower back/coccyx area.  Appears uncomfortable sitting on the table  Neurological:     Mental Status: He is alert.      UC Treatments / Results  Labs (all labs ordered are listed, but only abnormal results are displayed) Labs Reviewed - No data to display  EKG   Radiology No results found.  Procedures Procedures (including critical care time)  Medications Ordered in UC Medications - No data to display  Initial Impression / Assessment and Plan / UC Course  I have reviewed the triage vital signs and  the nursing notes.  Pertinent labs & imaging results that were available during my care of the patient were reviewed by me and considered in my medical decision making (see chart for details).    Final Clinical Impressions(s) / UC Diagnoses   Final diagnoses:  Tail bone pain  Closed fracture of sacrum and coccyx, initial encounter [facility])     Discharge Instructions      You were seen today after a fall.  I think you may have broken your tailbone.  As discussed, there is nothing we can do for this, other than control pain as best we can.  I have sent out lidocaine  patches, and norco for several days.  You should get a donut pillow to help with discomfort.  Please follow up with your primary care provider if you have long term discomfort.      ED Prescriptions     Medication Sig Dispense Auth. Provider   lidocaine  (LIDODERM ) 5 % Place 1 patch onto the skin daily. Remove & Discard patch within 12 hours or as directed by MD 15 patch Welda Azzarello, MD   HYDROcodone -acetaminophen  (NORCO/VICODIN) 5-325 MG tablet Take 2 tablets by mouth every 6 (six) hours as needed. 10 tablet Darral Longs, MD      PDMP not reviewed this encounter.   Darral Longs, MD 03/04/24 1002

## 2024-03-04 NOTE — ED Triage Notes (Signed)
 Patient here today with c/o tailbone pain X 4 days from falling out of a chair. Patient states that his tailbone landed on a root in the ground. He has tried taking Tylenol  and lidocaine  patches with no relief. He has also been using ice and heat with little relief.

## 2024-03-04 NOTE — Discharge Instructions (Addendum)
 You were seen today after a fall.  I think you may have broken your tailbone.  As discussed, there is nothing we can do for this, other than control pain as best we can.  I have sent out lidocaine  patches, and norco for several days.  You should get a donut pillow to help with discomfort.  Please follow up with your primary care provider if you have long term discomfort.

## 2024-03-15 ENCOUNTER — Encounter (HOSPITAL_COMMUNITY): Payer: Self-pay

## 2024-03-15 ENCOUNTER — Ambulatory Visit (HOSPITAL_COMMUNITY)
Admission: EM | Admit: 2024-03-15 | Discharge: 2024-03-15 | Disposition: A | Discharge status: Home / Self Care | Attending: Emergency Medicine | Admitting: Emergency Medicine

## 2024-03-15 DIAGNOSIS — S3992XD Unspecified injury of lower back, subsequent encounter: Secondary | ICD-10-CM | POA: Diagnosis not present

## 2024-03-15 DIAGNOSIS — L02212 Cutaneous abscess of back [any part, except buttock]: Secondary | ICD-10-CM

## 2024-03-15 DIAGNOSIS — H9201 Otalgia, right ear: Secondary | ICD-10-CM

## 2024-03-15 MED ORDER — HYDROCODONE-ACETAMINOPHEN 5-325 MG PO TABS: 1.0000 | ORAL_TABLET | Freq: Four times a day (QID) | ORAL | 0 refills | Status: AC | PRN

## 2024-03-15 MED ORDER — FLUTICASONE PROPIONATE 50 MCG/ACT NA SUSP: 2.0000 | Freq: Every day | NASAL | 0 refills | Status: AC

## 2024-03-15 NOTE — ED Triage Notes (Signed)
 Also, patient c/o of right ear fullness.

## 2024-03-15 NOTE — Discharge Instructions (Signed)
 Radiology over read of your imaging did not show fractures or bony abnormalities of the coccyx and sacrum.  I suspect you may be having exacerbation of your chronic back pain.  Take the Norco sparingly.  Please follow-up with your orthopedic regarding pain management, it may be beneficial to follow-up with pain management as well.  Take the Norco sparingly.  Take the Flonase  to see if this helps with the popping in your ears.  Make sure the scab to your back stays clean, you can rinse it in the shower with soap and warm water .  Applying a triple antibiotic can help prevent infection.  Follow-up with your orthopedic for any further pain or back concerns.  Return to clinic for new or urgent symptoms.

## 2024-03-15 NOTE — ED Triage Notes (Signed)
 Patient is here today for tailbone pain after failing out the chair x 1 week ago. Patient states his tailbone is crack. Patient is taking Tylenol  for pain.
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
## 2024-03-16 ENCOUNTER — Emergency Department (HOSPITAL_COMMUNITY)

## 2024-03-16 ENCOUNTER — Emergency Department (HOSPITAL_COMMUNITY)
Admission: EM | Admit: 2024-03-16 | Discharge: 2024-03-16 | Disposition: A | Discharge status: Home / Self Care | Attending: Emergency Medicine | Admitting: Emergency Medicine

## 2024-03-16 DIAGNOSIS — W14XXXA Fall from tree, initial encounter: Secondary | ICD-10-CM | POA: Insufficient documentation

## 2024-03-16 DIAGNOSIS — M62838 Other muscle spasm: Secondary | ICD-10-CM | POA: Diagnosis not present

## 2024-03-16 DIAGNOSIS — E119 Type 2 diabetes mellitus without complications: Secondary | ICD-10-CM | POA: Diagnosis not present

## 2024-03-16 DIAGNOSIS — Z794 Long term (current) use of insulin: Secondary | ICD-10-CM | POA: Diagnosis not present

## 2024-03-16 DIAGNOSIS — M79651 Pain in right thigh: Secondary | ICD-10-CM | POA: Diagnosis not present

## 2024-03-16 DIAGNOSIS — S300XXA Contusion of lower back and pelvis, initial encounter: Secondary | ICD-10-CM | POA: Insufficient documentation

## 2024-03-16 DIAGNOSIS — Z79899 Other long term (current) drug therapy: Secondary | ICD-10-CM | POA: Diagnosis not present

## 2024-03-16 DIAGNOSIS — N390 Urinary tract infection, site not specified: Secondary | ICD-10-CM | POA: Diagnosis not present

## 2024-03-16 DIAGNOSIS — M79652 Pain in left thigh: Secondary | ICD-10-CM | POA: Diagnosis not present

## 2024-03-16 DIAGNOSIS — M545 Low back pain, unspecified: Secondary | ICD-10-CM | POA: Diagnosis present

## 2024-03-16 DIAGNOSIS — I10 Essential (primary) hypertension: Secondary | ICD-10-CM | POA: Diagnosis not present

## 2024-03-16 DIAGNOSIS — M7918 Myalgia, other site: Secondary | ICD-10-CM

## 2024-03-16 DIAGNOSIS — I2510 Atherosclerotic heart disease of native coronary artery without angina pectoris: Secondary | ICD-10-CM | POA: Diagnosis not present

## 2024-03-16 LAB — CBC WITH DIFFERENTIAL/PLATELET
Abs Immature Granulocytes: 0.02 K/uL (ref 0.00–0.07)
Basophils Absolute: 0 K/uL (ref 0.0–0.1)
Basophils Relative: 1 %
Eosinophils Absolute: 0.2 K/uL (ref 0.0–0.5)
Eosinophils Relative: 3 %
HCT: 52.5 % — ABNORMAL HIGH (ref 39.0–52.0)
Hemoglobin: 16 g/dL (ref 13.0–17.0)
Immature Granulocytes: 0 %
Lymphocytes Relative: 27 %
Lymphs Abs: 1.6 K/uL (ref 0.7–4.0)
MCH: 27.4 pg (ref 26.0–34.0)
MCHC: 30.5 g/dL (ref 30.0–36.0)
MCV: 89.7 fL (ref 80.0–100.0)
Monocytes Absolute: 0.5 K/uL (ref 0.1–1.0)
Monocytes Relative: 8 %
Neutro Abs: 3.7 K/uL (ref 1.7–7.7)
Neutrophils Relative %: 61 %
Platelets: 153 K/uL (ref 150–400)
RBC: 5.85 MIL/uL — ABNORMAL HIGH (ref 4.22–5.81)
RDW: 13.7 % (ref 11.5–15.5)
WBC: 6.1 K/uL (ref 4.0–10.5)
nRBC: 0 % (ref 0.0–0.2)

## 2024-03-16 LAB — URINALYSIS, W/ REFLEX TO CULTURE (INFECTION SUSPECTED)
Bilirubin Urine: NEGATIVE
Glucose, UA: >=500 mg/dL — AB
Ketones, ur: NEGATIVE mg/dL
Nitrite: NEGATIVE
Protein, ur: 30 mg/dL — AB
Specific Gravity, Urine: 1.018 (ref 1.005–1.030)
WBC, UA: >50 WBC/hpf (ref 0–5)
pH: 5 (ref 5.0–8.0)

## 2024-03-16 LAB — CK: Total CK: 491 U/L — ABNORMAL HIGH (ref 49–397)

## 2024-03-16 LAB — COMPREHENSIVE METABOLIC PANEL WITH GFR
ALT: 62 U/L — ABNORMAL HIGH (ref 0–44)
AST: 43 U/L — ABNORMAL HIGH (ref 15–41)
Albumin: 4.2 g/dL (ref 3.5–5.0)
Alkaline Phosphatase: 88 U/L (ref 38–126)
Anion gap: 12 (ref 5–15)
BUN: 37 mg/dL — ABNORMAL HIGH (ref 8–23)
CO2: 18 mmol/L — ABNORMAL LOW (ref 22–32)
Calcium: 9.1 mg/dL (ref 8.9–10.3)
Chloride: 107 mmol/L (ref 98–111)
Creatinine, Ser: 1.81 mg/dL — ABNORMAL HIGH (ref 0.61–1.24)
GFR, Estimated: 40 mL/min — ABNORMAL LOW (ref >60)
Glucose, Bld: 188 mg/dL — ABNORMAL HIGH (ref 70–99)
Potassium: 5.3 mmol/L — ABNORMAL HIGH (ref 3.5–5.1)
Sodium: 137 mmol/L (ref 135–145)
Total Bilirubin: 0.3 mg/dL (ref 0.0–1.2)
Total Protein: 6.7 g/dL (ref 6.5–8.1)

## 2024-03-16 MED ORDER — LIDOCAINE 5 % EX PTCH: 1.0000 | MEDICATED_PATCH | CUTANEOUS | 0 refills | Status: AC

## 2024-03-16 MED ORDER — CEPHALEXIN 500 MG PO CAPS: 500.0000 mg | ORAL_CAPSULE | Freq: Four times a day (QID) | ORAL | 0 refills | Status: AC

## 2024-03-16 MED ORDER — OXYCODONE HCL 5 MG PO TABS: 5.0000 mg | ORAL_TABLET | ORAL | 0 refills | Status: AC | PRN

## 2024-03-16 MED ORDER — CYCLOBENZAPRINE HCL 10 MG PO TABS: 10.0000 mg | ORAL_TABLET | Freq: Two times a day (BID) | ORAL | 0 refills | Status: AC | PRN

## 2024-03-16 MED ORDER — HYDROMORPHONE HCL 1 MG/ML IJ SOLN
1.0000 mg | Freq: Once | INTRAMUSCULAR | Status: AC
  Administered 2024-03-16: 1 mg via INTRAVENOUS
  Filled 2024-03-16: qty 1

## 2024-03-16 NOTE — Discharge Instructions (Addendum)
 Your history, exam, workup today shows a possible contusion versus small nondisplaced fracture of your sacrum or tailbone but also shows evidence of urinary tract infection.  You had spasm and soreness on exam and we feel comfortable giving you prescription for some pain medicine, muscle relaxant, numbing patches, and the antibiotics for the UTI.  Please consider follow-up with a pain specialist and I included the number for one of the pain doctors with [facility].  Please rest and stay hydrated and take your home medicines.  Please follow-up with your primary doctor as well.  If any symptoms change or worsen acutely, please return to the nearest emergency department.

## 2024-03-16 NOTE — ED Provider Notes (Signed)
 [place] [facility] DEPARTMENT AT [facility] Provider Note   CSN: [id number] Arrival date & time: 03/16/24  9280     Patient presents with: Tailbone Pain   Ryan Farmer is a 68 y.o. male.   The history is provided by medical records and the patient. No language interpreter was used.  Back Pain Location:  Sacro-iliac joint and lumbar spine Quality:  Aching Radiates to:  R posterior upper leg, L posterior upper leg, R thigh and L thigh Pain severity:  Severe Pain is:  Same all the time Onset quality:  Sudden Duration:  2 weeks Timing:  Constant Progression:  Worsening Chronicity:  New Context: falling   Relieved by:  Nothing Worsened by:  Movement and palpation Ineffective treatments:  Narcotics Associated symptoms: bowel incontinence and weakness   Associated symptoms: no abdominal pain, no abdominal swelling, no chest pain, no dysuria, no fever, no headaches, no leg pain and no numbness        Prior to Admission medications   Medication Sig Start Date End Date Taking? Authorizing Provider  acetaminophen  (TYLENOL ) 650 MG CR tablet Take 2-3 tablets by mouth daily as needed for pain.    [provider]  Continuous Glucose Sensor (DEXCOM G7 SENSOR) MISC Change sensor every 10 days 01/29/24   Shamleffer, Ibtehal Jaralla, MD  dapagliflozin  propanediol (FARXIGA ) 10 MG TABS tablet Take 1 tablet (10 mg total) by mouth daily. 01/01/24   Shamleffer, Ibtehal Jaralla, MD  fluticasone  (FLONASE ) 50 MCG/ACT nasal spray Place 2 sprays into both nostrils daily. 03/15/24   Dreama, [place]  N, FNP  glucose blood (ACCU-CHEK GUIDE TEST) test strip USE THREE TIMES DAILY AS DIRECTED 07/06/23   Shamleffer, Ibtehal Jaralla, MD  HYDROcodone -acetaminophen  (NORCO/VICODIN) 5-325 MG tablet Take 1-2 tablets by mouth every 6 (six) hours as needed. 03/15/24   Dreama, [place]  N, FNP  Insulin  Disposable Pump (OMNIPOD 5 G7 PODS, GEN 5,) MISC Apply as directed every other day. 01/29/24    Shamleffer, Ibtehal Jaralla, MD  insulin  lispro (HUMALOG ) 100 UNIT/ML injection Max daily 100 units 01/01/24   Shamleffer, Ibtehal Jaralla, MD  insulin  lispro (HUMALOG ) 100 UNIT/ML injection Inject up to 1 mL (100 Units total) into the skin daily. 01/02/24   Shamleffer, Ibtehal Jaralla, MD  Insulin  Pen Needle (BD PEN NEEDLE NANO 2ND GEN) 32G X 4 MM MISC Inject 1 Device into the skin in the morning and at bedtime. 08/01/23   Shamleffer, Ibtehal Jaralla, MD  lidocaine  (LIDODERM ) 5 % Place 1 patch onto the skin daily. Remove & Discard patch within 12 hours or as directed by MD 03/04/24   Darral Longs, MD  losartan  (COZAAR ) 25 MG tablet Take 1 tablet (25 mg total) by mouth daily. 01/01/24   Shamleffer, Ibtehal Jaralla, MD  pioglitazone  (ACTOS ) 30 MG tablet Take 1 tablet (30 mg total) by mouth daily. 01/01/24   Shamleffer, Ibtehal Jaralla, MD  rosuvastatin  (CRESTOR ) 40 MG tablet Take 1 tablet (40 mg total) by mouth daily. 01/01/24   Shamleffer, Ibtehal Jaralla, MD  Semaglutide ,0.25 or 0.5MG /DOS, 2 MG/3ML SOPN Inject 0.5 mg into the skin once a week. 01/01/24   Shamleffer, Ibtehal Jaralla, MD    Allergies: Patient has no known allergies.    Review of Systems  Constitutional:  Negative for chills, fatigue and fever.  HENT:  Negative for congestion.   Respiratory:  Negative for cough, chest tightness, shortness of breath and wheezing.   Cardiovascular:  Negative for chest pain and palpitations.  Gastrointestinal:  Positive for bowel  incontinence. Negative for abdominal pain, constipation, diarrhea, nausea and vomiting.  Genitourinary:  Positive for difficulty urinating and urgency. Negative for dysuria, flank pain and frequency.  Musculoskeletal:  Positive for back pain. Negative for neck pain and neck stiffness.  Skin:  Negative for rash and wound.  Neurological:  Positive for weakness. Negative for light-headedness, numbness and headaches.  Psychiatric/Behavioral:  Negative for agitation.   All other  systems reviewed and are negative.   Updated Vital Signs BP (!) 152/90 (BP Location: Right Wrist)   Pulse 80   Temp 98 F (36.7 C) (Oral)   Resp 18   SpO2 95%   Physical Exam Vitals and nursing note reviewed.  Constitutional:      General: He is not in acute distress.    Appearance: He is well-developed. He is not ill-appearing, toxic-appearing or diaphoretic.  HENT:     Head: Normocephalic and atraumatic.     Nose: No congestion or rhinorrhea.     Mouth/Throat:     Mouth: Mucous membranes are moist.     Pharynx: No oropharyngeal exudate or posterior oropharyngeal erythema.  Eyes:     Extraocular Movements: Extraocular movements intact.     Conjunctiva/sclera: Conjunctivae normal.     Pupils: Pupils are equal, round, and reactive to light.  Cardiovascular:     Rate and Rhythm: Normal rate and regular rhythm.     Pulses: Normal pulses.     Heart sounds: No murmur heard. Pulmonary:     Effort: Pulmonary effort is normal. No respiratory distress.     Breath sounds: Normal breath sounds. No wheezing, rhonchi or rales.  Chest:     Chest wall: No tenderness.  Abdominal:     General: Abdomen is flat.     Palpations: Abdomen is soft.     Tenderness: There is no abdominal tenderness. There is no right CVA tenderness, left CVA tenderness, guarding or rebound.  Musculoskeletal:        General: Tenderness present. No swelling.     Cervical back: Neck supple.  Skin:    General: Skin is warm and dry.     Capillary Refill: Capillary refill takes less than 2 seconds.     Findings: No erythema.  Neurological:     General: No focal deficit present.     Mental Status: He is alert.     Sensory: No sensory deficit.     Motor: No weakness (subjectively weak per pt but symmetric strength in legs on y exam).  Psychiatric:        Mood and Affect: Mood normal.     (all labs ordered are listed, but only abnormal results are displayed) Labs Reviewed  CBC WITH DIFFERENTIAL/PLATELET -  Abnormal; Notable for the following components:      Result Value   RBC 5.85 (*)    HCT 52.5 (*)    All other components within normal limits  COMPREHENSIVE METABOLIC PANEL WITH GFR - Abnormal; Notable for the following components:   Potassium 5.3 (*)    CO2 18 (*)    Glucose, Bld 188 (*)    BUN 37 (*)    Creatinine, Ser 1.81 (*)    AST 43 (*)    ALT 62 (*)    GFR, Estimated 40 (*)    All other components within normal limits  URINALYSIS, W/ REFLEX TO CULTURE (INFECTION SUSPECTED) - Abnormal; Notable for the following components:   APPearance HAZY (*)    Glucose, UA >=500 (*)  Hgb urine dipstick MODERATE (*)    Protein, ur 30 (*)    Leukocytes,Ua MODERATE (*)    Bacteria, UA RARE (*)    All other components within normal limits  CK - Abnormal; Notable for the following components:   Total CK 491 (*)    All other components within normal limits  URINE CULTURE    EKG: None  Radiology: MR SACRUM SI JOINTS WO CONTRAST Result Date: 03/16/2024 EXAM: MR Sacrum without intravenous contrast. 03/16/2024 10:53:00 AM TECHNIQUE: Multiplanar multi-sequence MR imaging of the sacrum was performed. No intravenous contrast was administered. COMPARISON: Plain radiographs of the sacrum and coccyx dated 03/04/2024. CLINICAL HISTORY: Polytrauma, blunt; Fall with trauma, severe low back pain with some weakness in the legs and difficulty with urination and stooling on self. History of previous back surgery. X-ray of sacrum normal today, rule out occult fractures. FINDINGS: URINARY TRACT: No abnormality visualized. BOWEL: Unremarkable visualized pelvic bowel loops. VASCULAR AND LYMPHATIC: No pathologically enlarged lymph nodes. No significant vascular abnormality seen. REPRODUCTIVE: No mass or other significant abnormality OTHER: Musculoskeletal: There is soft tissue edema present within the coccyx and the adjacent soft tissues compatible with contusion versus nondisplaced fracture. IMPRESSION: 1. Soft  tissue edema within the coccyx and adjacent soft tissues, compatible with contusion versus nondisplaced fracture. Electronically signed by: Evalene Coho MD 03/16/2024 11:09 AM EDT RP Workstation: [id number]   MR LUMBAR SPINE WO CONTRAST Result Date: 03/16/2024 EXAM: MRI LUMBAR SPINE 03/16/2024 10:49:21 AM TECHNIQUE: Multiplanar multisequence MRI of the lumbar spine was performed without the administration of intravenous contrast. COMPARISON: None available. CLINICAL HISTORY: Lumbar radiculopathy, trauma. Lumbar radiculopathy, trauma hx. Of L-Spn surg. FINDINGS: BONES AND ALIGNMENT: At T12-L1, there is slight degenerative retrolisthesis. The patient is status post L3-4 interbody fusion, left laminectomy and bilateral posterolateral spinal fixation. The left kidney is absent. SPINAL CORD: The conus medullaris terminates at T12-L1. SOFT TISSUES: No paraspinal mass. T12-L1: There is disc space narrowing, slight degenerative retrolisthesis and mild central spinal canal stenosis. L1-L2: There is mild diffuse disc bulging with mild central spinal canal stenosis and bilateral lateral recess stenosis. No apparent nerve root impingement. L2-L3: There is broad-based disc bulging and bilateral facet hypertrophy, causing moderate central spinal canal stenosis and moderate bilateral lateral recess stenosis, with apparent impingement of the L3 nerves in the lateral recesses bilaterally. L3-L4: The patient is status post interbody fusion, left laminectomy and bilateral posterolateral spinal fixation. There is mild bilateral lateral recess stenosis. L4-L5: There is diffuse disc bulging with mild central spinal canal stenosis, moderate right lateral recess and mild-to-moderate left lateral recess stenosis. There is questionable impingement of the right L5 nerve in the lateral recess. L5-S1: There is a left paracentral protrusion with mild left-sided central spinal canal stenosis. The neural foramina are widely patent.  IMPRESSION: 1. Moderate central spinal canal stenosis and moderate bilateral lateral recess stenosis at L2-3, with apparent impingement of the L3 nerves in the lateral recesses bilaterally. 2. Mild central spinal canal stenosis at L1-2 and L4-5, with moderate right lateral recess and mild-to-moderate left lateral recess stenosis at L4-5. Questionable impingement of the right L5 nerve in the lateral recess at L4-5. 3. Status post interbody fusion, left laminectomy, and bilateral posterolateral spinal fixation at L3-4. Electronically signed by: Evalene Coho MD 03/16/2024 11:03 AM EDT RP Workstation: [id number]     Procedures   Medications Ordered in the ED  HYDROmorphone  (DILAUDID ) injection 1 mg (1 mg Intravenous Given 03/16/24 0832)  Medical Decision Making Amount and/or Complexity of Data Reviewed Labs: ordered. Radiology: ordered.  Risk Prescription drug management.    Ryan Farmer is a 68 y.o. male with a past medical history significant for hypertension, dyslipidemia, diabetes, previous renal mass status post left nephrectomy, hypercholesterolemia, CAD, previous back surgeries, and chronic pain who presents with worsened low back pain after a fall with intermittent bilateral leg weakness, urgency but difficulty with urination, and some intermittent loss of stool control.  According to patient, he had a fall about 2 weeks ago where a walker collapsed on him and he fell onto a root and a tree outside.  He initially was told he likely had a sacral fracture and was given conservative management with pain medicine and numbing patches.  He reports that over the last 2 weeks his symptoms have worsened with now 10 out of 10 pain in his low back and sacral area and he is having difficult time with ambulation.  He says that he has had weakness in both legs that feels worse than he has had in the past but denies numbness.  He reports he is having urgency like he  is to urinate but is unable to do so well.  He reports he has had some times where he has lost control of his stool and had a bowel movement on himself without meaning to.  He reports no anterior abdominal pain or groin pain.  Denies any chest pain or upper back pain.  Denies headache or neck pain.  Denies fevers, chills, congestion, cough, nausea, or vomiting.  Reports he has been taking the pain medicine as directed but he has not been able to afford a donut pillow and has not followed up with orthopedics or back doctor.  He went to urgent care yesterday and had an x-ray of his sacrum that did not show acute fracture but the pain worsened overnight so he presents here.  On exam, he has tenderness in his paraspinal and mid low back and sacral area.  He does have surgical scars that are not focally tender initially.  Lungs are clear chest nontender.  Abdomen nontender.  He does describe intermittent anterior hernia from the scar where he had his kidney removed but he is having no tenderness there.  Good bowel sounds.  Distally she had intact sensation and pulses but he subjectively feels that he has weakness with raising his legs.  It does cause his back to hurt when he raises them.  Intact sensation strength and pulses in upper extremities.  No other focal deficits.  Patient does have a small wound that is scabbed over and well-appearing where he had an abscess lanced by a friend recently.  No evidence of acute infection at this time.  Had a shared decision-making conversation with the patient about workup.  We discussed that since he had a negative x-ray yesterday but still having worsened pain we agreed to escalate to an advanced imaging to rule out fracture.  With his weakness urine and stool changes we agreed to get MRI to rule out cauda equina so we will get the MRI of the lumbar spine and sacrum to look for occult fracture as well.  Will get urinalysis given his urinary symptoms and some basic labs.  He  does report some soreness in his thighs on the anterior side so we will get a CK.  I told him I suspect he may be ambulating different than he is used to and using different  muscles more than he is used to causing so muscle soreness.  Will give some IV pain medicine initially to await workup results.  Anticipate reassessment to determine disposition after workup.  12:33 PM Patient was able to ambulate around the room and void without difficulty.  He is feeling better after medications.  His MRI showed evidence of some stenosis and nerve impingement but no large cord compression.  His urinalysis does show UTI which makes sense given his urinary symptoms.  He also had contusion versus nondisplaced fracture of the sacrum.  We went through all the findings gather and he would like to go home.  He wanted number for a pain doctor which I provided.  Will give him prescription for pain medicine, muscle accident, numbing patches, and antibiotics for the UTI.  He is driving home so will not give more IV pain medicine and patient agrees.  He understands return precautions and follow-up instructions and we also encouraged and follow with a back doctor.  He had no other questions or concerns and was discharged in good condition.      Final diagnoses:  Contusion of coccyx, initial encounter  Buttock pain  Acute low back pain, unspecified back pain laterality, unspecified whether sciatica present  Urinary tract infection without hematuria, site unspecified  Muscle spasm    ED Discharge Orders          Ordered    cephALEXin  (KEFLEX ) 500 MG capsule  4 times daily        03/16/24 1232    oxyCODONE  (ROXICODONE ) 5 MG immediate release tablet  Every 4 hours PRN        03/16/24 1232    cyclobenzaprine  (FLEXERIL ) 10 MG tablet  2 times daily PRN        03/16/24 1232    lidocaine  (LIDODERM ) 5 %  Every 24 hours        03/16/24 1232            Clinical Impression: 1. Contusion of coccyx, initial  encounter   2. Buttock pain   3. Acute low back pain, unspecified back pain laterality, unspecified whether sciatica present   4. Urinary tract infection without hematuria, site unspecified   5. Muscle spasm     Disposition: Discharge  Condition: Good  I have discussed the results, Dx and Tx plan with the pt(& family if present). He/she/they expressed understanding and agree(s) with the plan. Discharge instructions discussed at great length. Strict return precautions discussed and pt &/or family have verbalized understanding of the instructions. No further questions at time of discharge.    New Prescriptions   CEPHALEXIN  (KEFLEX ) 500 MG CAPSULE    Take 1 capsule (500 mg total) by mouth 4 (four) times daily for 10 days.   CYCLOBENZAPRINE  (FLEXERIL ) 10 MG TABLET    Take 1 tablet (10 mg total) by mouth 2 (two) times daily as needed for muscle spasms.   LIDOCAINE  (LIDODERM ) 5 %    Place 1 patch onto the skin daily. Remove & Discard patch within 12 hours or as directed by MD   OXYCODONE  (ROXICODONE ) 5 MG IMMEDIATE RELEASE TABLET    Take 1 tablet (5 mg total) by mouth every 4 (four) hours as needed for severe pain (pain score 7-10).    Follow Up: Catherene Toribio Pac, MD [address] [place] [address] [phone]   with pain management with [facility] [address] [place]   [id number] [phone] Schedule an appointment as soon as possible for a visit       Cova Knieriem, Lonni PARAS, MD 03/16/24 1235

## 2024-03-16 NOTE — ED Triage Notes (Signed)
 Patient in today reporting tailbone pain from fall 2 weeks ago out of chair. Oxycodone  with no relief.

## 2024-03-18 LAB — URINE CULTURE: Culture: >=100000 — AB

## 2024-03-19 ENCOUNTER — Telehealth (HOSPITAL_BASED_OUTPATIENT_CLINIC_OR_DEPARTMENT_OTHER): Payer: Self-pay

## 2024-03-19 NOTE — Telephone Encounter (Signed)
 Post ED Visit - Positive Culture Follow-up  Culture report reviewed by antimicrobial stewardship pharmacist: [facility] []  Rankin Dee, Pharm.D. []  Venetia Gully, Pharm.D., BCPS AQ-ID []  Garrel Crews, Pharm.D., BCPS []  Almarie Lunger, Pharm.D., BCPS []  [place], [address].D., BCPS, AAHIVP []  Rosaline Bihari, Pharm.D., BCPS, AAHIVP []  Vernell Meier, PharmD, BCPS []  Latanya Hint, PharmD, BCPS []  Donald Medley, PharmD, BCPS []  Rocky Bold, PharmD []  Dorothyann Alert, PharmD, BCPS []  Morene Babe, PharmD  [facility] [x]  Dionicia Hotter, PharmD PGY 1 []  Romona Bliss, PharmD []  Dolphus Roller, PharmD []  Veva Seip, Rph []  Vernell Daunt) Leonce, PharmD []  Eva Allis, PharmD []  Rosaline Millet, PharmD []  Iantha Batch, PharmD []  Arvin Gauss, PharmD []  Wanda Hasting, PharmD []  Ronal Rav, PharmD []  Rocky Slade, PharmD []  Bard Jeans, PharmD   Positive urine culture Treated with Cephalexin , organism sensitive to the same and no further patient follow-up is required at this time.  Ryan Farmer 03/19/2024, 10:37 AM

## 2024-03-26 ENCOUNTER — Ambulatory Visit (INDEPENDENT_AMBULATORY_CARE_PROVIDER_SITE_OTHER): Admitting: Internal Medicine

## 2024-03-26 ENCOUNTER — Other Ambulatory Visit (HOSPITAL_COMMUNITY): Payer: Self-pay

## 2024-03-26 ENCOUNTER — Encounter: Payer: Self-pay | Admitting: Internal Medicine

## 2024-03-26 ENCOUNTER — Telehealth: Payer: Self-pay

## 2024-03-26 ENCOUNTER — Other Ambulatory Visit: Payer: Self-pay

## 2024-03-26 VITALS — BP 130/80 | HR 88 | Ht 72.0 in | Wt 223.0 lb

## 2024-03-26 DIAGNOSIS — E1142 Type 2 diabetes mellitus with diabetic polyneuropathy: Secondary | ICD-10-CM

## 2024-03-26 DIAGNOSIS — E785 Hyperlipidemia, unspecified: Secondary | ICD-10-CM | POA: Diagnosis not present

## 2024-03-26 DIAGNOSIS — Z794 Long term (current) use of insulin: Secondary | ICD-10-CM

## 2024-03-26 DIAGNOSIS — E1165 Type 2 diabetes mellitus with hyperglycemia: Secondary | ICD-10-CM | POA: Diagnosis not present

## 2024-03-26 DIAGNOSIS — N1832 Chronic kidney disease, stage 3b: Secondary | ICD-10-CM

## 2024-03-26 DIAGNOSIS — E1129 Type 2 diabetes mellitus with other diabetic kidney complication: Secondary | ICD-10-CM | POA: Diagnosis not present

## 2024-03-26 DIAGNOSIS — R809 Proteinuria, unspecified: Secondary | ICD-10-CM

## 2024-03-26 DIAGNOSIS — E1122 Type 2 diabetes mellitus with diabetic chronic kidney disease: Secondary | ICD-10-CM

## 2024-03-26 LAB — POCT GLYCOSYLATED HEMOGLOBIN (HGB A1C): Hemoglobin A1C: 8.2 % — AB (ref 4.0–5.6)

## 2024-03-26 MED ORDER — INSULIN LISPRO 100 UNIT/ML IJ SOLN
100.0000 [IU] | Freq: Every day | INTRAMUSCULAR | 3 refills | Status: DC
  Filled 2024-03-26: qty 100, 100d supply, fill #0

## 2024-03-26 MED ORDER — DEXCOM G7 SENSOR MISC
1.0000 | 3 refills | Status: AC
  Filled 2024-03-26: qty 9, fill #0

## 2024-03-26 MED ORDER — OMNIPOD 5 G7 PODS (GEN 5) MISC
1.0000 | 3 refills | Status: AC
  Filled 2024-03-26 – 2024-06-18 (×3): qty 45, 90d supply, fill #0

## 2024-03-26 NOTE — Telephone Encounter (Signed)
 LVMTCB to provide information below

## 2024-03-26 NOTE — Progress Notes (Signed)
 Name: Ryan Farmer  Age/ Sex: 68 y.o., male   MRN/ DOB: [id number], 09-Feb-1956     PCP: System, Provider Not In   Reason for Endocrinology Evaluation: Type 2 Diabetes Mellitus  Initial Endocrine Consultative Visit: 04/15/2019    PATIENT IDENTIFIER: [name] is a 68 y.o. male with a past medical history of CAD, T2DM, Hyperlipidemia and polysubstance abuse, s/p left nephrectomy due to [facility] 05/2022. The patient has followed with Endocrinology clinic since 04/15/2019 for consultative assistance with management of his diabetes.  DIABETIC HISTORY:  [name] was diagnosed with DM in 2011. He has been on SU in the past, has been on metformin  since diagnosis, insulin  started in 2018. His hemoglobin A1c has ranged from 6.4% in 2017, peaking at 12.6% in 2020.  On his initial visit to our clinic he was on Lantus  and Metformin  with an A1c 10.4% . We stopped lantus  and started a novolog  Mix.    We attempted MDI regimen in 2022 but the patient did not like that regimen as he felt the insulin  works were stronger and he was switched back July 2023  Lives with sister, on disability ( can not read or write)    Discontinued metformin  03/2023 based on his request due to skepticism about side effects Started pioglitazone  03/2023   He was started on the OmniPod 11/2023  Started Ozempic  11/2023  SUBJECTIVE:   During the last visit (01/01/2024): A1c 8.5%    Today (03/26/2024): Mr. [name] is here for a follow up on diabetes management.  Checks glucose occasionally , no hypoglycemia  Patient has had multiple ED visits for low back pain  No nausea or vomiting  No constipation or diarrhea     This patient with type 2 diabetes is treated with OmniPod (insulin  pump). During the visit the pump basal and bolus doses were reviewed including carb/insulin  rations and supplemental doses. The clinical list was updated. The glucose meter download was reviewed in detail to determine if the current pump  settings are providing the best glycemic control without excessive hypoglycemia.  Pump and meter download:    Pump   OmniPod Settings   Insulin  type   Humalog    Basal rate       0000 1.0              I:C ratio       0000 1:1                  Sensitivity       0000  30      Goal       0000  130            Type & Model of Pump: omnipod Insulin  Type: Currently using Humalog .  Body mass index is 30.24 kg/m.  PUMP STATISTICS: No data   HOME DIABETES REGIMEN:  Humalog   Farxiga  10 mg, 1 tablet daily  Pioglitazone  30 mg daily Ozempic  0.5 mg weekly Rosuvastatin  40 mg daily Losartan  25 mg daily  CONTINUOUS GLUCOSE MONITORING RECORD INTERPRETATION    Dates of Recording: 9/3-9/16/2025  Sensor description: Dexcom  Results statistics:   CGM use % of time 92  Average and SD 219/50  Time in range 22 %  % Time Above 180 55  % Time above 250 23  % Time Below target 0   Glycemic patterns summary: Bg's are elevated during the day and night   Hyperglycemic episodes all day and night  Hypoglycemic episodes  occurred once during the day   Overnight periods: High    DIABETIC COMPLICATIONS: Microvascular complications:  Neuropathy  Denies: CKD , retinopathy  Last eye exam: Completed > 1    Macrovascular complications:  CAD Denies: PVD, CVA      HISTORY:  Past Medical History:  Past Medical History:  Diagnosis Date   Arthritis    All over   CAD (coronary artery disease), native coronary artery, minimal by cardiac cath 01/08/19 01/09/2019   Chronic pain syndrome    Diabetes mellitus    History of cocaine  use    History of kidney stones    Hypercholesteremia    Hypertension    Low back pain    Lumbar radiculopathy    Right leg pain    Seborrheic dermatitis of scalp    Shingles    Past Surgical History:  Past Surgical History:  Procedure Laterality Date   CIRCUMCISION N/A 03/16/2018   Procedure: CIRCUMCISION ADULT WITH PENILE BLOCK;   Surgeon: Alvaro Hummer, MD;  Location: [facility];  Service: Urology;  Laterality: N/A;  45 MINS   KNEE ARTHROSCOPY Bilateral    LEFT HEART CATH AND CORONARY ANGIOGRAPHY N/A 01/08/2019   Procedure: LEFT HEART CATH AND CORONARY ANGIOGRAPHY;  Surgeon: Burnard Debby LABOR, MD;  Location: [facility];  Service: Cardiovascular;  Laterality: N/A;   LYMPH NODE DISSECTION Left 05/13/2022   Procedure: RETROPERITONEAL LYMPH NODE DISSECTIONLYMPH NODE DISSECTION;  Surgeon: Alvaro Hummer, MD;  Location: [facility];  Service: Urology;  Laterality: Left;   ROBOT ASSISTED LAPAROSCOPIC NEPHRECTOMY Left 05/13/2022   Procedure: XI ROBOTIC ASSISTED LAPAROSCOPIC NEPHRECTOMY;  Surgeon: Alvaro Hummer, MD;  Location: [facility];  Service: Urology;  Laterality: Left;  3 HRS   Social History:  reports that he has been smoking cigarettes. He has a 50 pack-year smoking history. He has quit using smokeless tobacco.  His smokeless tobacco use included chew. He reports that he does not currently use alcohol. He reports that he does not currently use drugs after having used the following drugs: Marijuana and Cocaine . Family History:  Family History  Problem Relation Age of Onset   Cancer Mother    Stroke Father    Heart disease Father    Diabetes Father      HOME MEDICATIONS: Allergies as of 03/26/2024   No Known Allergies      Medication List        Accurate as of March 26, 2024  9:07 AM. If you have any questions, ask your nurse or doctor.          STOP taking these medications    BD Pen Needle Nano 2nd Gen 32G X 4 MM Misc Generic drug: Insulin  Pen Needle Stopped by: Frisco Cordts J Juleah Paradise   pioglitazone  30 MG tablet Commonly known as: ACTOS  Stopped by: Johncarlos Holtsclaw J Krizia Flight       TAKE these medications    Accu-Chek Guide Test test strip Generic drug: glucose blood USE THREE TIMES DAILY AS DIRECTED   acetaminophen  650 MG CR tablet Commonly known as: TYLENOL  Take 2-3 tablets  by mouth daily as needed for pain.   cephALEXin  500 MG capsule Commonly known as: KEFLEX  Take 1 capsule (500 mg total) by mouth 4 (four) times daily for 10 days.   cyclobenzaprine  10 MG tablet Commonly known as: FLEXERIL  Take 1 tablet (10 mg total) by mouth 2 (two) times daily as needed for muscle spasms.   dapagliflozin  propanediol 10 MG Tabs tablet Commonly known as:  Farxiga  Take 1 tablet (10 mg total) by mouth daily.   Dexcom G7 Sensor Misc 1 Device by Other route as directed. Change sensor every 10 days What changed:  how much to take how to take this when to take this Changed by: Donell PARAS Yasseen Salls   fluticasone  50 MCG/ACT nasal spray Commonly known as: FLONASE  Place 2 sprays into both nostrils daily.   HYDROcodone -acetaminophen  5-325 MG tablet Commonly known as: NORCO/VICODIN Take 1-2 tablets by mouth every 6 (six) hours as needed.   insulin  lispro 100 UNIT/ML injection Commonly known as: HumaLOG  Inject up to 1 mL (100 Units total) into the skin daily. What changed: Another medication with the same name was removed. Continue taking this medication, and follow the directions you see here. Changed by: Donell PARAS Shauna Bodkins   lidocaine  5 % Commonly known as: Lidoderm  Place 1 patch onto the skin daily. Remove & Discard patch within 12 hours or as directed by MD   lidocaine  5 % Commonly known as: Lidoderm  Place 1 patch onto the skin daily. Remove & Discard patch within 12 hours or as directed by MD   losartan  25 MG tablet Commonly known as: COZAAR  Take 1 tablet (25 mg total) by mouth daily.   Omnipod 5 G7 Pods (Gen 5) Misc Apply as directed every other day.   oxyCODONE  5 MG immediate release tablet Commonly known as: Roxicodone  Take 1 tablet (5 mg total) by mouth every 4 (four) hours as needed for severe pain (pain score 7-10).   rosuvastatin  40 MG tablet Commonly known as: CRESTOR  Take 1 tablet (40 mg total) by mouth daily.   Semaglutide (0.25 or  0.5MG /DOS) 2 MG/3ML Sopn Inject 0.5 mg into the skin once a week.         OBJECTIVE:   Vital Signs: BP 130/80 (BP Location: Left Arm, Patient Position: Sitting, Cuff Size: Normal)   Pulse 88   Ht 6' (1.829 m)   Wt 223 lb (101.2 kg)   SpO2 96%   BMI 30.24 kg/m   Wt Readings from Last 3 Encounters:  03/26/24 223 lb (101.2 kg)  01/01/24 233 lb (105.7 kg)  09/21/23 220 lb (99.8 kg)     Exam: General: Pt appears well and is in NAD  Lungs: Clear with good BS bilat   Heart: RRR   Neuro: MS is good with appropriate affect, pt is alert and Ox3      DM foot exam: 03/26/2024   The skin of the feet is without sores or ulcerations. The pedal pulses are 1+ on right and 1+ on left. The sensation is decreased  at the left heel to a screening 5.07, 10 gram monofilament    DATA REVIEWED:  Lab Results  Component Value Date   HGBA1C 8.2 (A) 03/26/2024   HGBA1C 8.5 (A) 01/01/2024   HGBA1C 10.6 (A) 08/01/2023     Latest Reference Range & Units 03/16/24 08:37  Sodium 135 - 145 mmol/L 137  Potassium 3.5 - 5.1 mmol/L 5.3 (H)  Chloride 98 - 111 mmol/L 107  CO2 22 - 32 mmol/L 18 (L)  Glucose 70 - 99 mg/dL 811 (H)  BUN 8 - 23 mg/dL 37 (H)  Creatinine 9.38 - 1.24 mg/dL 8.18 (H)  Calcium  8.9 - 10.3 mg/dL 9.1  Anion gap 5 - 15  12  Alkaline Phosphatase 38 - 126 U/L 88  Albumin 3.5 - 5.0 g/dL 4.2  AST 15 - 41 U/L 43 (H)  ALT 0 - 44 U/L 62 (H)  Total Protein 6.5 - 8.1 g/dL 6.7  Total Bilirubin 0.0 - 1.2 mg/dL 0.3  GFR, Estimated >39 mL/min 40 (L)  (H): Data is abnormally high (L): Data is abnormally low   ASSESSMENT / PLAN / RECOMMENDATIONS:   1) Type 2  Diabetes Mellitus, poorly control, With Neuropathy, CKD III  and CAD  complications - Most recent A1c of 8.2 % . Goal A1c < 7.5 %.    -A1c continues to trend down - Omnipod is in manual mode as his phone is not compatible with dexcom app use.  - Bg's continue to be elevated  - Pt will increase CHO per meal and will increase  basal rate as below  - I have also changed his SF from 30 to 25 - Pt was provided with a prescription for diabetic shoes  - He doesn't want to be on pioglitazone  due to cardiac side effects   MEDICATIONS:  Humalog  per insulin  pump Take Farxiga  10 mg, 1 tablet daily  Continue Ozempic  0.5 mg weekly    Pump   OmniPod Settings   Insulin  type   Humalog    Basal rate       0000 1.15              I:C ratio       0000 1:1    Enter #10 g with each meal               Sensitivity       0000  25      Goal       0000  130         EDUCATION / INSTRUCTIONS: BG monitoring instructions: Patient is instructed to check his blood sugars 2 times a day, fasting and supper . Call [facility] clinic if: BG persistently < 70 I reviewed the Rule of 15 for the treatment of hypoglycemia in detail with the patient. Literature supplied.  2) Diabetic complications:  Eye: Does not have known diabetic retinopathy. Neuro/ Feet: Does  have known diabetic peripheral neuropathy. Renal: Patient does not have known baseline CKD. He is on an ACEI/ARB at present   3) Hypertriglyceridemia:   -I had switched atorvastatin  to rosuvastatin  with a triglyceride of 720 Mg/DL in 0/7975 - He is doing better with statin intake    Medication  Take rosuvastatin  40 mg daily    4) CKD III/Microalbuminuria :  -He is s/p nephrectomy due to [facility] 05/2022 -Started ARB 03/2023   Medication Take losartan  25 mg daily   F/U in 4 months    Signed electronically by: Stefano Redgie Butts, MD  [facility] Endocrinology  [facility] [address] [place] [place], [place] [address] Phone: [phone] FAX: [phone]   CC: System, Provider Not In No address on file Phone: None  Fax: None  Return to Endocrinology clinic as below: No future appointments.

## 2024-03-26 NOTE — Telephone Encounter (Signed)
 Patient went to [facility] and they no longer accept MCR/MCD. He needs to know where else he can go to get the diabetic shoes.   Please advise, thank you!

## 2024-03-26 NOTE — Patient Instructions (Addendum)
  [facility] :  [address] [place], [place] [address] Hours:  Open ? Closes 5?PM Phone: [phone]    HOW TO TREAT LOW BLOOD SUGARS (Blood sugar LESS THAN 70 MG/DL) Please follow the RULE OF 15 for the treatment of hypoglycemia treatment (when your (blood sugars are less than 70 mg/dL)   STEP 1: Take 15 grams of carbohydrates when your blood sugar is low, which includes:  3-4 GLUCOSE TABS  OR 3-4 OZ OF JUICE OR REGULAR SODA OR ONE TUBE OF GLUCOSE GEL    STEP 2: RECHECK blood sugar in 15 MINUTES STEP 3: If your blood sugar is still low at the 15 minute recheck --> then, go back to STEP 1 and treat AGAIN with another 15 grams of carbohydrates.

## 2024-03-26 NOTE — Telephone Encounter (Signed)
 [facility] [address] Open ? Closes 5?PM  [phone]   [facility] Open ? Closes 4:30?PM  [phone]

## 2024-03-28 ENCOUNTER — Other Ambulatory Visit (HOSPITAL_COMMUNITY): Payer: Self-pay

## 2024-03-28 NOTE — Telephone Encounter (Signed)
 LMTCB

## 2024-03-29 ENCOUNTER — Other Ambulatory Visit: Payer: Self-pay

## 2024-03-29 ENCOUNTER — Other Ambulatory Visit (HOSPITAL_COMMUNITY): Payer: Self-pay

## 2024-03-29 MED ORDER — INSULIN PEN NEEDLE 32G X 4 MM MISC: 1.0000 | Freq: Two times a day (BID) | 3 refills | Status: DC

## 2024-03-29 MED ORDER — OZEMPIC (0.25 OR 0.5 MG/DOSE) 2 MG/3ML ~~LOC~~ SOPN
0.2500 mg | PEN_INJECTOR | SUBCUTANEOUS | 1 refills | Status: DC
  Filled 2024-04-01: qty 3, 28d supply, fill #0
  Filled 2024-05-07: qty 3, 28d supply, fill #1

## 2024-03-29 MED ORDER — DEXCOM G7 SENSOR MISC
1.0000 | 3 refills | Status: AC
  Filled 2024-04-17: qty 9, 90d supply, fill #0
  Filled 2024-06-18 – 2024-06-24 (×2): qty 9, 90d supply, fill #1

## 2024-03-29 MED ORDER — DAPAGLIFLOZIN PROPANEDIOL 10 MG PO TABS: 10.0000 mg | ORAL_TABLET | Freq: Every day | ORAL | 3 refills | Status: DC

## 2024-03-29 MED ORDER — INSULIN LISPRO 100 UNIT/ML IJ SOLN
60.0000 [IU] | Freq: Every day | INTRAMUSCULAR | 3 refills | Status: DC
  Filled 2024-06-18: qty 60, 100d supply, fill #0

## 2024-03-29 MED ORDER — PIOGLITAZONE HCL 30 MG PO TABS
30.0000 mg | ORAL_TABLET | Freq: Every day | ORAL | 3 refills | Status: DC
  Filled 2024-04-01: qty 90, 90d supply, fill #0

## 2024-03-29 MED ORDER — LOSARTAN POTASSIUM 25 MG PO TABS
25.0000 mg | ORAL_TABLET | Freq: Every day | ORAL | 3 refills | Status: AC
  Filled 2024-04-01: qty 90, 90d supply, fill #0

## 2024-03-29 MED ORDER — INSULIN LISPRO PROT & LISPRO (75-25 MIX) 100 UNIT/ML KWIKPEN: 36.0000 [IU] | PEN_INJECTOR | Freq: Two times a day (BID) | SUBCUTANEOUS | 3 refills | Status: DC

## 2024-04-01 ENCOUNTER — Other Ambulatory Visit (HOSPITAL_BASED_OUTPATIENT_CLINIC_OR_DEPARTMENT_OTHER): Payer: Self-pay

## 2024-04-01 ENCOUNTER — Other Ambulatory Visit: Payer: Self-pay

## 2024-04-01 ENCOUNTER — Other Ambulatory Visit (HOSPITAL_COMMUNITY): Payer: Self-pay

## 2024-04-02 ENCOUNTER — Other Ambulatory Visit (HOSPITAL_COMMUNITY): Payer: Self-pay

## 2024-04-17 ENCOUNTER — Other Ambulatory Visit: Payer: Self-pay

## 2024-04-30 ENCOUNTER — Encounter (INDEPENDENT_AMBULATORY_CARE_PROVIDER_SITE_OTHER): Admitting: Ophthalmology

## 2024-04-30 DIAGNOSIS — H353112 Nonexudative age-related macular degeneration, right eye, intermediate dry stage: Secondary | ICD-10-CM | POA: Diagnosis not present

## 2024-04-30 DIAGNOSIS — H35372 Puckering of macula, left eye: Secondary | ICD-10-CM | POA: Diagnosis not present

## 2024-04-30 DIAGNOSIS — H33302 Unspecified retinal break, left eye: Secondary | ICD-10-CM | POA: Diagnosis not present

## 2024-04-30 DIAGNOSIS — H43813 Vitreous degeneration, bilateral: Secondary | ICD-10-CM

## 2024-05-07 ENCOUNTER — Other Ambulatory Visit (HOSPITAL_COMMUNITY): Payer: Self-pay

## 2024-05-09 ENCOUNTER — Other Ambulatory Visit (HOSPITAL_COMMUNITY): Payer: Self-pay

## 2024-05-14 ENCOUNTER — Encounter (INDEPENDENT_AMBULATORY_CARE_PROVIDER_SITE_OTHER): Admitting: Ophthalmology

## 2024-05-14 DIAGNOSIS — H43813 Vitreous degeneration, bilateral: Secondary | ICD-10-CM

## 2024-05-14 DIAGNOSIS — H33302 Unspecified retinal break, left eye: Secondary | ICD-10-CM | POA: Diagnosis not present

## 2024-05-14 DIAGNOSIS — H35372 Puckering of macula, left eye: Secondary | ICD-10-CM | POA: Diagnosis not present

## 2024-05-14 DIAGNOSIS — H353112 Nonexudative age-related macular degeneration, right eye, intermediate dry stage: Secondary | ICD-10-CM

## 2024-05-23 ENCOUNTER — Other Ambulatory Visit: Payer: Self-pay

## 2024-05-23 ENCOUNTER — Encounter: Admitting: Dietician

## 2024-05-23 DIAGNOSIS — E1129 Type 2 diabetes mellitus with other diabetic kidney complication: Secondary | ICD-10-CM

## 2024-05-23 MED ORDER — INSULIN PEN NEEDLE 32G X 4 MM MISC: 1.0000 | Freq: Two times a day (BID) | 3 refills | Status: DC

## 2024-05-23 MED ORDER — INSULIN LISPRO 100 UNIT/ML IJ SOLN: 100.0000 [IU] | Freq: Every day | INTRAMUSCULAR | 3 refills | Status: DC

## 2024-05-23 NOTE — Telephone Encounter (Signed)
 Patient seen in office today with trouble with his sensors and pump.  Patient was assisted by Leita.  Refills sent since he had to waste some product.

## 2024-05-23 NOTE — Progress Notes (Signed)
 Patient arrive this am to [facility] endocrinology for assistance with his Omnipod insulin  pump.   Patient was seen by myself as a walk in from [phone]. He states that he is unable to read or write. He has filled 3 PODs with insulin  but none have beeped and they will not pair.  He states that he has used the Omnipod for the past year and has not had this problem in the past.  He states that he fills each POD with 200 units of insulin . I was able to successfully place a fill, place, and pair a POD to his PDM.  He runs his pump in manual mode as he uses a Armed forces logistics/support/administrative officer. Patient verbalized how to bolus. I called Omnipod tech support who took the information for each POD.  They will send patient replacements. CMA was asked to send a prescription for insulin  syringes and be sure he has enough Humalog  due to increased waste due to POD errors today.  This has been sent in.  Patient with no further questions at this time.  Leita Constable, RD, LDN, CDCES, DipACLM

## 2024-05-30 ENCOUNTER — Other Ambulatory Visit: Payer: Self-pay | Admitting: Physician Assistant

## 2024-05-30 DIAGNOSIS — Z122 Encounter for screening for malignant neoplasm of respiratory organs: Secondary | ICD-10-CM

## 2024-06-01 ENCOUNTER — Other Ambulatory Visit: Payer: Self-pay | Admitting: Internal Medicine

## 2024-06-01 ENCOUNTER — Other Ambulatory Visit (HOSPITAL_COMMUNITY): Payer: Self-pay

## 2024-06-03 ENCOUNTER — Other Ambulatory Visit (HOSPITAL_COMMUNITY): Payer: Self-pay

## 2024-06-03 ENCOUNTER — Other Ambulatory Visit: Payer: Self-pay

## 2024-06-03 MED FILL — Semaglutide Soln Pen-inj 0.25 or 0.5 MG/DOSE (2 MG/3ML): SUBCUTANEOUS | 56 days supply | Qty: 3 | Fill #0 | Status: AC

## 2024-06-14 ENCOUNTER — Other Ambulatory Visit

## 2024-06-18 ENCOUNTER — Other Ambulatory Visit (HOSPITAL_BASED_OUTPATIENT_CLINIC_OR_DEPARTMENT_OTHER): Payer: Self-pay

## 2024-06-18 ENCOUNTER — Other Ambulatory Visit (HOSPITAL_COMMUNITY): Payer: Self-pay

## 2024-06-18 ENCOUNTER — Other Ambulatory Visit: Payer: Self-pay

## 2024-07-04 ENCOUNTER — Other Ambulatory Visit: Payer: Self-pay

## 2024-07-04 ENCOUNTER — Inpatient Hospital Stay (HOSPITAL_COMMUNITY)
Admission: EM | Admit: 2024-07-04 | Discharge: 2024-07-06 | Discharge status: Against Medical Advice | Attending: Internal Medicine | Admitting: Internal Medicine

## 2024-07-04 ENCOUNTER — Emergency Department (HOSPITAL_COMMUNITY)

## 2024-07-04 DIAGNOSIS — Z8249 Family history of ischemic heart disease and other diseases of the circulatory system: Secondary | ICD-10-CM | POA: Diagnosis not present

## 2024-07-04 DIAGNOSIS — Z7984 Long term (current) use of oral hypoglycemic drugs: Secondary | ICD-10-CM

## 2024-07-04 DIAGNOSIS — F17210 Nicotine dependence, cigarettes, uncomplicated: Secondary | ICD-10-CM | POA: Diagnosis present

## 2024-07-04 DIAGNOSIS — I2510 Atherosclerotic heart disease of native coronary artery without angina pectoris: Secondary | ICD-10-CM | POA: Diagnosis present

## 2024-07-04 DIAGNOSIS — N1831 Chronic kidney disease, stage 3a: Secondary | ICD-10-CM | POA: Diagnosis present

## 2024-07-04 DIAGNOSIS — J101 Influenza due to other identified influenza virus with other respiratory manifestations: Secondary | ICD-10-CM | POA: Diagnosis not present

## 2024-07-04 DIAGNOSIS — J1008 Influenza due to other identified influenza virus with other specified pneumonia: Secondary | ICD-10-CM | POA: Diagnosis present

## 2024-07-04 DIAGNOSIS — Z79899 Other long term (current) drug therapy: Secondary | ICD-10-CM | POA: Diagnosis not present

## 2024-07-04 DIAGNOSIS — I129 Hypertensive chronic kidney disease with stage 1 through stage 4 chronic kidney disease, or unspecified chronic kidney disease: Secondary | ICD-10-CM | POA: Diagnosis present

## 2024-07-04 DIAGNOSIS — Z87442 Personal history of urinary calculi: Secondary | ICD-10-CM | POA: Diagnosis not present

## 2024-07-04 DIAGNOSIS — Z85528 Personal history of other malignant neoplasm of kidney: Secondary | ICD-10-CM

## 2024-07-04 DIAGNOSIS — Z9641 Presence of insulin pump (external) (internal): Secondary | ICD-10-CM | POA: Diagnosis present

## 2024-07-04 DIAGNOSIS — G894 Chronic pain syndrome: Secondary | ICD-10-CM | POA: Diagnosis present

## 2024-07-04 DIAGNOSIS — J111 Influenza due to unidentified influenza virus with other respiratory manifestations: Secondary | ICD-10-CM | POA: Diagnosis present

## 2024-07-04 DIAGNOSIS — E1122 Type 2 diabetes mellitus with diabetic chronic kidney disease: Secondary | ICD-10-CM | POA: Diagnosis present

## 2024-07-04 DIAGNOSIS — Z8619 Personal history of other infectious and parasitic diseases: Secondary | ICD-10-CM

## 2024-07-04 DIAGNOSIS — Z833 Family history of diabetes mellitus: Secondary | ICD-10-CM | POA: Diagnosis not present

## 2024-07-04 DIAGNOSIS — E7800 Pure hypercholesterolemia, unspecified: Secondary | ICD-10-CM | POA: Diagnosis present

## 2024-07-04 DIAGNOSIS — R0902 Hypoxemia: Secondary | ICD-10-CM

## 2024-07-04 DIAGNOSIS — R911 Solitary pulmonary nodule: Secondary | ICD-10-CM | POA: Diagnosis present

## 2024-07-04 DIAGNOSIS — Z794 Long term (current) use of insulin: Secondary | ICD-10-CM

## 2024-07-04 DIAGNOSIS — J129 Viral pneumonia, unspecified: Secondary | ICD-10-CM | POA: Diagnosis present

## 2024-07-04 DIAGNOSIS — J9601 Acute respiratory failure with hypoxia: Secondary | ICD-10-CM | POA: Diagnosis present

## 2024-07-04 DIAGNOSIS — Z7985 Long-term (current) use of injectable non-insulin antidiabetic drugs: Secondary | ICD-10-CM

## 2024-07-04 DIAGNOSIS — R0602 Shortness of breath: Secondary | ICD-10-CM | POA: Diagnosis present

## 2024-07-04 DIAGNOSIS — Z905 Acquired absence of kidney: Secondary | ICD-10-CM | POA: Diagnosis not present

## 2024-07-04 DIAGNOSIS — M1990 Unspecified osteoarthritis, unspecified site: Secondary | ICD-10-CM | POA: Diagnosis present

## 2024-07-04 DIAGNOSIS — R0682 Tachypnea, not elsewhere classified: Secondary | ICD-10-CM

## 2024-07-04 DIAGNOSIS — R590 Localized enlarged lymph nodes: Secondary | ICD-10-CM | POA: Diagnosis present

## 2024-07-04 DIAGNOSIS — E871 Hypo-osmolality and hyponatremia: Secondary | ICD-10-CM | POA: Diagnosis present

## 2024-07-04 LAB — COMPREHENSIVE METABOLIC PANEL WITH GFR
ALT: 73 U/L — ABNORMAL HIGH (ref 0–44)
AST: 29 U/L (ref 15–41)
Albumin: 4.6 g/dL (ref 3.5–5.0)
Alkaline Phosphatase: 83 U/L (ref 38–126)
Anion gap: 12 (ref 5–15)
BUN: 27 mg/dL — ABNORMAL HIGH (ref 8–23)
CO2: 25 mmol/L (ref 22–32)
Calcium: 9.7 mg/dL (ref 8.9–10.3)
Chloride: 98 mmol/L (ref 98–111)
Creatinine, Ser: 1.72 mg/dL — ABNORMAL HIGH (ref 0.61–1.24)
GFR, Estimated: 43 mL/min — ABNORMAL LOW
Glucose, Bld: 176 mg/dL — ABNORMAL HIGH (ref 70–99)
Potassium: 4.8 mmol/L (ref 3.5–5.1)
Sodium: 134 mmol/L — ABNORMAL LOW (ref 135–145)
Total Bilirubin: 0.6 mg/dL (ref 0.0–1.2)
Total Protein: 8.1 g/dL (ref 6.5–8.1)

## 2024-07-04 LAB — RESP PANEL BY RT-PCR (RSV, FLU A&B, COVID)  RVPGX2
Influenza A by PCR: POSITIVE — AB
Influenza B by PCR: NEGATIVE
Resp Syncytial Virus by PCR: NEGATIVE
SARS Coronavirus 2 by RT PCR: NEGATIVE

## 2024-07-04 LAB — CBC WITH DIFFERENTIAL/PLATELET
Abs Immature Granulocytes: 0.06 K/uL (ref 0.00–0.07)
Basophils Absolute: 0 K/uL (ref 0.0–0.1)
Basophils Relative: 0 %
Eosinophils Absolute: 0 K/uL (ref 0.0–0.5)
Eosinophils Relative: 0 %
HCT: 51.7 % (ref 39.0–52.0)
Hemoglobin: 16.4 g/dL (ref 13.0–17.0)
Immature Granulocytes: 1 %
Lymphocytes Relative: 6 %
Lymphs Abs: 0.6 K/uL — ABNORMAL LOW (ref 0.7–4.0)
MCH: 28.3 pg (ref 26.0–34.0)
MCHC: 31.7 g/dL (ref 30.0–36.0)
MCV: 89.1 fL (ref 80.0–100.0)
Monocytes Absolute: 0.7 K/uL (ref 0.1–1.0)
Monocytes Relative: 7 %
Neutro Abs: 8.6 K/uL — ABNORMAL HIGH (ref 1.7–7.7)
Neutrophils Relative %: 86 %
Platelets: 173 K/uL (ref 150–400)
RBC: 5.8 MIL/uL (ref 4.22–5.81)
RDW: 14.2 % (ref 11.5–15.5)
WBC: 10 K/uL (ref 4.0–10.5)
nRBC: 0 % (ref 0.0–0.2)

## 2024-07-04 LAB — PRO BRAIN NATRIURETIC PEPTIDE: Pro Brain Natriuretic Peptide: 92.4 pg/mL

## 2024-07-04 LAB — TROPONIN T, HIGH SENSITIVITY
Troponin T High Sensitivity: 25 ng/L — ABNORMAL HIGH (ref 0–19)
Troponin T High Sensitivity: 26 ng/L — ABNORMAL HIGH (ref 0–19)

## 2024-07-04 MED ORDER — ACETAMINOPHEN 325 MG PO TABS: 650.0000 mg | ORAL_TABLET | Freq: Four times a day (QID) | ORAL | Status: DC | PRN

## 2024-07-04 MED ORDER — ACETAMINOPHEN 650 MG RE SUPP: 650.0000 mg | Freq: Four times a day (QID) | RECTAL | Status: DC | PRN

## 2024-07-04 MED ORDER — ENOXAPARIN SODIUM 40 MG/0.4ML IJ SOSY
40.0000 mg | PREFILLED_SYRINGE | INTRAMUSCULAR | Status: DC
  Administered 2024-07-04 – 2024-07-05 (×2): 40 mg via SUBCUTANEOUS
  Filled 2024-07-04 (×2): qty 0.4

## 2024-07-04 MED ORDER — IPRATROPIUM-ALBUTEROL 0.5-2.5 (3) MG/3ML IN SOLN
3.0000 mL | Freq: Once | RESPIRATORY_TRACT | Status: AC
  Administered 2024-07-04: 3 mL via RESPIRATORY_TRACT
  Filled 2024-07-04: qty 3

## 2024-07-04 MED ORDER — LOSARTAN POTASSIUM 25 MG PO TABS
25.0000 mg | ORAL_TABLET | Freq: Every day | ORAL | Status: DC
  Administered 2024-07-04 – 2024-07-05 (×2): 25 mg via ORAL
  Filled 2024-07-04 (×2): qty 1

## 2024-07-04 MED ORDER — ROSUVASTATIN CALCIUM 10 MG PO TABS
40.0000 mg | ORAL_TABLET | Freq: Every day | ORAL | Status: DC
  Administered 2024-07-04 – 2024-07-05 (×2): 40 mg via ORAL
  Filled 2024-07-04: qty 2
  Filled 2024-07-04: qty 4

## 2024-07-04 MED ORDER — IOHEXOL 350 MG/ML SOLN
60.0000 mL | Freq: Once | INTRAVENOUS | Status: AC | PRN
  Administered 2024-07-04: 60 mL via INTRAVENOUS

## 2024-07-04 MED ORDER — OSELTAMIVIR PHOSPHATE 30 MG PO CAPS
30.0000 mg | ORAL_CAPSULE | Freq: Two times a day (BID) | ORAL | Status: DC
  Administered 2024-07-04 – 2024-07-05 (×3): 30 mg via ORAL
  Filled 2024-07-04 (×4): qty 1

## 2024-07-04 MED ORDER — HYDROCODONE-ACETAMINOPHEN 5-325 MG PO TABS
1.0000 | ORAL_TABLET | Freq: Four times a day (QID) | ORAL | Status: DC | PRN
  Administered 2024-07-05: 2 via ORAL
  Filled 2024-07-04: qty 2

## 2024-07-04 MED ORDER — LOSARTAN POTASSIUM 25 MG PO TABS: 25.0000 mg | ORAL_TABLET | Freq: Every day | ORAL | Status: DC

## 2024-07-04 MED ORDER — SODIUM CHLORIDE 0.9 % IV BOLUS
1000.0000 mL | Freq: Once | INTRAVENOUS | Status: AC
  Administered 2024-07-04: 1000 mL via INTRAVENOUS

## 2024-07-04 NOTE — ED Provider Notes (Signed)
 " [place] EMERGENCY DEPARTMENT AT [facility] Provider Note   CSN: [id number] Arrival date & time: 07/04/24  1701     Patient presents with: URI   Ryan Farmer is a 68 y.o. male.  Patient is a 68 year old male with a history of hypertension, cocaine  abuse, chronic pain syndrome, type 2 diabetes, and CAD who presents to the ED for increasing fluid symptoms for the past week and a half.  He notes he has had subjective fever/chills, cough, congestion, and bodyaches.  He has been taking Tylenol  with minimal relief.  Notes symptoms have anything to get worse.  He does not believe he has been around anyone else that has been sick but he is unsure.  Notes some intermittent shortness of breath as well.  Denies headache, dizziness, syncope, chest pain, abdominal pain, nausea/vomiting/diarrhea.    URI Presenting symptoms: congestion, cough and fever   Associated symptoms: myalgias   Associated symptoms: no headaches        Prior to Admission medications  Medication Sig Start Date End Date Taking? Authorizing Provider  acetaminophen  (TYLENOL ) 650 MG CR tablet Take 2-3 tablets by mouth daily as needed for pain.    [provider]  Continuous Glucose Sensor (DEXCOM G7 SENSOR) MISC Use as directed. Change sensor every 10 days 03/26/24   Shamleffer, Donell Cardinal, MD  Continuous Glucose Sensor (DEXCOM G7 SENSOR) MISC Use as directed. 01/11/24     cyclobenzaprine  (FLEXERIL ) 10 MG tablet Take 1 tablet (10 mg total) by mouth 2 (two) times daily as needed for muscle spasms. 03/16/24   Tegeler, Lonni PARAS, MD  dapagliflozin  propanediol (FARXIGA ) 10 MG TABS tablet Take 1 tablet (10 mg total) by mouth daily. 01/01/24   Shamleffer, Ibtehal Jaralla, MD  dapagliflozin  propanediol (FARXIGA ) 10 MG TABS tablet Take 1 tablet (10 mg total) by mouth daily. 01/01/24     fluticasone  (FLONASE ) 50 MCG/ACT nasal spray Place 2 sprays into both nostrils daily. 03/15/24   Dreama, [place]  N, FNP   glucose blood (ACCU-CHEK GUIDE TEST) test strip USE THREE TIMES DAILY AS DIRECTED 07/06/23   Shamleffer, Ibtehal Jaralla, MD  HYDROcodone -acetaminophen  (NORCO/VICODIN) 5-325 MG tablet Take 1-2 tablets by mouth every 6 (six) hours as needed. 03/15/24   Dreama, [place]  N, FNP  Insulin  Disposable Pump (OMNIPOD 5 G7 PODS, GEN 5,) MISC Apply as directed every other day. 03/26/24   Shamleffer, Ibtehal Jaralla, MD  insulin  lispro (HUMALOG ) 100 UNIT/ML injection Inject a maximum of 60 Units total into the skin per day. 10/31/23   Shamleffer, Ibtehal Jaralla, MD  insulin  lispro (HUMALOG ) 100 UNIT/ML injection Inject up to 1 mL (100 Units total) into the skin daily. 05/23/24   Shamleffer, Ibtehal Jaralla, MD  Insulin  Lispro Prot & Lispro (HUMALOG  75/25 MIX) (75-25) 100 UNIT/ML Kwikpen Inject 36 Units into the skin in the morning and at bedtime. 08/19/23   Shamleffer, Ibtehal Jaralla, MD  Insulin  Pen Needle 32G X 4 MM MISC Use to inject into the skin 2 (two) times daily. 05/23/24   Shamleffer, Donell Cardinal, MD  lidocaine  (LIDODERM ) 5 % Place 1 patch onto the skin daily. Remove & Discard patch within 12 hours or as directed by MD 03/04/24   Darral Longs, MD  lidocaine  (LIDODERM ) 5 % Place 1 patch onto the skin daily. Remove & Discard patch within 12 hours or as directed by MD 03/16/24   Tegeler, Lonni PARAS, MD  losartan  (COZAAR ) 25 MG tablet Take 1 tablet (25 mg total) by mouth daily. 01/01/24  Shamleffer, Ibtehal Jaralla, MD  losartan  (COZAAR ) 25 MG tablet Take 1 tablet (25 mg total) by mouth daily. 01/01/24     oxyCODONE  (ROXICODONE ) 5 MG immediate release tablet Take 1 tablet (5 mg total) by mouth every 4 (four) hours as needed for severe pain (pain score 7-10). 03/16/24   Tegeler, Lonni PARAS, MD  Semaglutide ,0.25 or 0.5MG /DOS, (OZEMPIC , 0.25 OR 0.5 MG/DOSE,) 2 MG/3ML SOPN Inject 0.25 mg into the skin once a week. 06/03/24   Shamleffer, Ibtehal Jaralla, MD  pioglitazone  (ACTOS ) 30 MG tablet Take 1 tablet (30  mg total) by mouth daily. 01/01/24     rosuvastatin  (CRESTOR ) 40 MG tablet Take 1 tablet (40 mg total) by mouth daily. 01/01/24   Shamleffer, Ibtehal Jaralla, MD  Semaglutide ,0.25 or 0.5MG /DOS, 2 MG/3ML SOPN Inject 0.5 mg into the skin once a week. 01/01/24   Shamleffer, Ibtehal Jaralla, MD    Allergies: Patient has no known allergies.    Review of Systems  Constitutional:  Positive for chills and fever.  HENT:  Positive for congestion.   Respiratory:  Positive for cough and shortness of breath.   Cardiovascular:  Negative for chest pain.  Gastrointestinal:  Negative for abdominal pain, diarrhea, nausea and vomiting.  Musculoskeletal:  Positive for myalgias.  Neurological:  Negative for dizziness and headaches.  All other systems reviewed and are negative.   Updated Vital Signs BP (!) 179/122   Pulse (!) 112   Temp 98.7 F (37.1 C)   Resp (!) 35   SpO2 97%   Physical Exam Constitutional:      Appearance: Normal appearance.  HENT:     Head: Normocephalic and atraumatic.     Nose: Congestion and rhinorrhea present.  Cardiovascular:     Rate and Rhythm: Tachycardia present.  Pulmonary:     Effort: Pulmonary effort is normal.     Breath sounds: Normal breath sounds.  Skin:    General: Skin is warm and dry.  Neurological:     Mental Status: He is alert and oriented to person, place, and time.  Psychiatric:        Mood and Affect: Mood normal.        Behavior: Behavior normal.     (all labs ordered are listed, but only abnormal results are displayed) Labs Reviewed  RESP PANEL BY RT-PCR (RSV, FLU A&B, COVID)  RVPGX2 - Abnormal; Notable for the following components:      Result Value   Influenza A by PCR POSITIVE (*)    All other components within normal limits  COMPREHENSIVE METABOLIC PANEL WITH GFR - Abnormal; Notable for the following components:   Sodium 134 (*)    Glucose, Bld 176 (*)    BUN 27 (*)    Creatinine, Ser 1.72 (*)    ALT 73 (*)    GFR, Estimated 43 (*)     All other components within normal limits  CBC WITH DIFFERENTIAL/PLATELET - Abnormal; Notable for the following components:   Neutro Abs 8.6 (*)    Lymphs Abs 0.6 (*)    All other components within normal limits  TROPONIN T, HIGH SENSITIVITY - Abnormal; Notable for the following components:   Troponin T High Sensitivity 25 (*)    All other components within normal limits  TROPONIN T, HIGH SENSITIVITY - Abnormal; Notable for the following components:   Troponin T High Sensitivity 26 (*)    All other components within normal limits  PRO BRAIN NATRIURETIC PEPTIDE    EKG: None  Radiology:  CT Angio Chest PE W and/or Wo Contrast Result Date: 07/04/2024 EXAM: CTA CHEST 07/04/2024 07:54:43 PM TECHNIQUE: CTA of the chest was performed without and with the administration of 60 mL of iohexol  (OMNIPAQUE ) 350 MG/ML injection. Multiplanar reformatted images are provided for review. MIP images are provided for review. Automated exposure control, iterative reconstruction, and/or weight based adjustment of the mA/kV was utilized to reduce the radiation dose to as low as reasonably achievable. Motion degraded study. COMPARISON: 09/27/2023 CLINICAL HISTORY: Pulmonary embolism (PE) suspected, high prob. FINDINGS: PULMONARY ARTERIES: Pulmonary arteries are adequately opacified for evaluation. No acute pulmonary embolus. Main pulmonary artery is normal in caliber. MEDIASTINUM: The heart and pericardium demonstrate no acute abnormality. There is no acute abnormality of the thoracic aorta. LYMPH NODES: Enlargement of the left hilar and AP window lymph nodes. For example, the AP window lymph node measures 2.2 x 2.5 cm (axial 59). Left suprahilar lymph node measures 1.6 x 2 cm (axial 66). There is also a prominent right infrahilar lymph node measuring 1 cm. No axillary lymphadenopathy. LUNGS AND PLEURA: Redemonstrated left upper lobe nodule abutting fissure measuring approximately 7 x 10 mm (axial 58). No focal  consolidation or pulmonary edema. No evidence of pleural effusion or pneumothorax. UPPER ABDOMEN: Limited images of the upper abdomen are unremarkable. SOFT TISSUES AND BONES: Thoracic disc with multilevel degenerative disc disease of the visualized spine. No acute soft tissue abnormality. IMPRESSION: 1. No pulmonary embolism, pneumonia, pulmonary edema, or pleural effusion. 2. Interval development of AP window and left hilar lymphadenopathy. The AP window lymph node is the largest, measuring 2.2x2.5 cm. A prominent right infrahilar lymph node is also present. While these could be reactive, they are worrisome for developing metastatic disease. A nonemergent PET CT should be considered for further characterization. 3. Posterior left upper lobe nodule present measuring 7 x 10 mm . This should also be further evaluated on the aforementioned PET CT to exclude metastatic disease. Electronically signed by: Rogelia Myers MD 07/04/2024 08:41 PM EST RP Workstation: [id number]   DG Chest 2 View Result Date: 07/04/2024 CLINICAL DATA:  Fever, cough and feeling unwell x3 days. EXAM: CHEST - 2 VIEW COMPARISON:  January 05, 2024 FINDINGS: The heart size and mediastinal contours are within normal limits. There is mild peribronchial cuffing with mildly increased suprahilar and infrahilar lung markings noted bilaterally. No focal consolidation, pleural effusion or pneumothorax is identified. The visualized skeletal structures are unremarkable. IMPRESSION: Findings suggestive of viral bronchitis versus reactive airway disease. Electronically Signed   By: Suzen Dials M.D.   On: 07/04/2024 18:05      Medications Ordered in the ED  sodium chloride  0.9 % bolus 1,000 mL (0 mLs Intravenous Stopped 07/04/24 2006)  iohexol  (OMNIPAQUE ) 350 MG/ML injection 60 mL (60 mLs Intravenous Contrast Given 07/04/24 1942)  ipratropium-albuterol  (DUONEB) 0.5-2.5 (3) MG/3ML nebulizer solution 3 mL (3 mLs Nebulization Given 07/04/24 2037)     Clinical Course as of 07/04/24 2137  Thu Jul 04, 2024  1817 Influenza A By PCR(!): POSITIVE [AY]  1830 Creatinine(!): 1.72 Similar to baseline [AY]    Clinical Course User Index [AY] Neysa Thersia RAMAN, PA-C                                Medical Decision Making Patient is a 68 year old male who presents to the ED for flulike symptoms for the past week and a half that have increased over the past 3 days.  Also notes associated shortness of breath.  Please see detailed HPI above.  On initial exam patient is alert and in no acute distress.  Physical exam as noted above.  Lungs were clear to auscultation.  He was mildly tachycardic but otherwise vital signs were stable.  On reevaluation, patient was found to be hypoxic at 83% on room air as well as tachypneic at around 35.  He was placed on 4 L nasal cannula.  He was given a DuoNeb treatment with minimal relief.  Differential includes acute viral illness, bronchitis, pneumonia, ACS.  Lab workup significant for positive influenza A.  Initial troponin 25, repeat 26.  Most likely elevated secondary to demand.  Creatinine similar to baseline.  Mild hyponatremia 134, he was given a liter of fluids as well.  Heart rate has remained around 110.  EKG shows sinus rhythm.  CT PE was obtained due to hypoxia and tachycardia.  There is no acute PE noted today but he does have multiple lung nodules that will need further evaluation.  Patient does have chronic tobacco use to suspect some underlying COPD that is exacerbating symptoms as well.  In the setting of patient's tachypnea and hypoxia with influenza infection, he will need further evaluation and management.  Patient is currently on 4 L nasal cannula.  Hospitalist service has been consulted and are agreeable to admit patient for further evaluation.  Patient stable while in ED.  I discussed this case with my attending physician who cosigned this note including patient's presenting symptoms, physical exam, and  planned diagnostics and interventions. Attending physician stated agreement with plan or made changes to plan which were implemented.   Attending physician assessed patient at bedside.    Amount and/or Complexity of Data Reviewed Labs: ordered. Decision-making details documented in ED Course. Radiology: ordered.  Risk Prescription drug management. Decision regarding hospitalization.     CRITICAL CARE Performed by: Thersia GORMAN Salt Total critical care time: 31 minutes Critical care time was exclusive of separately billable procedures and treating other patients. Critical care was necessary to treat or prevent imminent or life-threatening deterioration. Critical care was time spent personally by me on the following activities: development of treatment plan with patient and/or surrogate as well as nursing, discussions with consultants, evaluation of patient's response to treatment, examination of patient, obtaining history from patient or surrogate, ordering and performing treatments and interventions, ordering and review of laboratory studies, ordering and review of radiographic studies, pulse oximetry and re-evaluation of patient's condition.   Final diagnoses:  Influenza A  Hypoxia  Tachypnea    ED Discharge Orders     None          Salt Thersia GORMAN DEVONNA 07/04/24 2137    Mannie Pac T, DO 07/07/24 1522  "

## 2024-07-04 NOTE — ED Notes (Signed)
 Patient was heavy smoker until two weeks ago.  O2 sat dropped to 87-88 and placed on 2L [place].  Patient is a mouth breather and had to educate him to importance of breathing in through nose and out through mouth.  O2 92% on 2L.  Will continue to monitor.

## 2024-07-04 NOTE — ED Triage Notes (Signed)
 Fever, cough, feeling unwell x 3 days

## 2024-07-04 NOTE — H&P (Signed)
 " History and Physical    Ryan Farmer FMW:[id number] DOB: [birth year] DOA: 07/04/2024  PCP: System, Provider Not In   Chief Complaint: uri  HPI: ULYSEE Farmer is a 68 y.o. male with medical history significant of hypertension, type 2 diabetes, CAD who presents emergency department with upper respiratory symptoms including fever chill cough and congestion.  Patient taken Tylenol  at home without relief.  Has been having progressively worsening shortness of breath so he presented to the ER for further assessment.  On arrival he was afebrile and hemodynamically stable.  Labs were obtained which show flu positive, creatinine 1.7, sodium 134, WBC 10.0, hemoglobin 16.4, troponin 25, 26.  Patient underwent CT pulmonary embolism study which showed no evidence of PE however hilar adenopathy.  Chest x-ray showed possible viral bronchitis.  Patient was found to be flu positive and negative further workup.   Review of Systems: Review of Systems  Constitutional:  Positive for chills and fever.  Cardiovascular:  Positive for orthopnea.  All other systems reviewed and are negative.    As per HPI otherwise 10 point review of systems negative.   Allergies[1]  Past Medical History:  Diagnosis Date   Arthritis    All over   CAD (coronary artery disease), native coronary artery, minimal by cardiac cath 01/08/19 01/09/2019   Chronic pain syndrome    Diabetes mellitus    History of cocaine  use    History of kidney stones    Hypercholesteremia    Hypertension    Low back pain    Lumbar radiculopathy    Right leg pain    Seborrheic dermatitis of scalp    Shingles     Past Surgical History:  Procedure Laterality Date   CIRCUMCISION N/A 03/16/2018   Procedure: CIRCUMCISION ADULT WITH PENILE BLOCK;  Surgeon: Alvaro Hummer, MD;  Location: [facility];  Service: Urology;  Laterality: N/A;  45 MINS   KNEE ARTHROSCOPY Bilateral    LEFT HEART CATH AND CORONARY ANGIOGRAPHY N/A 01/08/2019    Procedure: LEFT HEART CATH AND CORONARY ANGIOGRAPHY;  Surgeon: Burnard Debby LABOR, MD;  Location: [facility];  Service: Cardiovascular;  Laterality: N/A;   LYMPH NODE DISSECTION Left 05/13/2022   Procedure: RETROPERITONEAL LYMPH NODE DISSECTIONLYMPH NODE DISSECTION;  Surgeon: Alvaro Hummer, MD;  Location: [facility];  Service: Urology;  Laterality: Left;   ROBOT ASSISTED LAPAROSCOPIC NEPHRECTOMY Left 05/13/2022   Procedure: XI ROBOTIC ASSISTED LAPAROSCOPIC NEPHRECTOMY;  Surgeon: Alvaro Hummer, MD;  Location: [facility];  Service: Urology;  Laterality: Left;  3 HRS     reports that he has been smoking cigarettes. He has a 50 pack-year smoking history. He has quit using smokeless tobacco.  His smokeless tobacco use included chew. He reports that he does not currently use alcohol. He reports that he does not currently use drugs after having used the following drugs: Marijuana and Cocaine .  Family History  Problem Relation Age of Onset   Cancer Mother    Stroke Father    Heart disease Father    Diabetes Father     Prior to Admission medications  Medication Sig Start Date End Date Taking? Authorizing Provider  acetaminophen  (TYLENOL ) 650 MG CR tablet Take 2-3 tablets by mouth daily as needed for pain.    [provider]  Continuous Glucose Sensor (DEXCOM G7 SENSOR) MISC Use as directed. Change sensor every 10 days 03/26/24   Shamleffer, Donell Cardinal, MD  Continuous Glucose Sensor (DEXCOM G7 SENSOR) MISC Use as directed.  01/11/24     cyclobenzaprine  (FLEXERIL ) 10 MG tablet Take 1 tablet (10 mg total) by mouth 2 (two) times daily as needed for muscle spasms. 03/16/24   Tegeler, Lonni PARAS, MD  dapagliflozin  propanediol (FARXIGA ) 10 MG TABS tablet Take 1 tablet (10 mg total) by mouth daily. 01/01/24   Shamleffer, Ibtehal Jaralla, MD  dapagliflozin  propanediol (FARXIGA ) 10 MG TABS tablet Take 1 tablet (10 mg total) by mouth daily. 01/01/24     fluticasone  (FLONASE ) 50 MCG/ACT nasal spray Place  2 sprays into both nostrils daily. 03/15/24   Dreama, [place]  N, FNP  glucose blood (ACCU-CHEK GUIDE TEST) test strip USE THREE TIMES DAILY AS DIRECTED 07/06/23   Shamleffer, Ibtehal Jaralla, MD  HYDROcodone -acetaminophen  (NORCO/VICODIN) 5-325 MG tablet Take 1-2 tablets by mouth every 6 (six) hours as needed. 03/15/24   Dreama, [place]  N, FNP  Insulin  Disposable Pump (OMNIPOD 5 G7 PODS, GEN 5,) MISC Apply as directed every other day. 03/26/24   Shamleffer, Ibtehal Jaralla, MD  insulin  lispro (HUMALOG ) 100 UNIT/ML injection Inject a maximum of 60 Units total into the skin per day. 10/31/23   Shamleffer, Ibtehal Jaralla, MD  insulin  lispro (HUMALOG ) 100 UNIT/ML injection Inject up to 1 mL (100 Units total) into the skin daily. 05/23/24   Shamleffer, Ibtehal Jaralla, MD  Insulin  Lispro Prot & Lispro (HUMALOG  75/25 MIX) (75-25) 100 UNIT/ML Kwikpen Inject 36 Units into the skin in the morning and at bedtime. 08/19/23   Shamleffer, Ibtehal Jaralla, MD  Insulin  Pen Needle 32G X 4 MM MISC Use to inject into the skin 2 (two) times daily. 05/23/24   Shamleffer, Ibtehal Jaralla, MD  lidocaine  (LIDODERM ) 5 % Place 1 patch onto the skin daily. Remove & Discard patch within 12 hours or as directed by MD 03/04/24   Darral Longs, MD  lidocaine  (LIDODERM ) 5 % Place 1 patch onto the skin daily. Remove & Discard patch within 12 hours or as directed by MD 03/16/24   Tegeler, Lonni PARAS, MD  losartan  (COZAAR ) 25 MG tablet Take 1 tablet (25 mg total) by mouth daily. 01/01/24   Shamleffer, Ibtehal Jaralla, MD  losartan  (COZAAR ) 25 MG tablet Take 1 tablet (25 mg total) by mouth daily. 01/01/24     oxyCODONE  (ROXICODONE ) 5 MG immediate release tablet Take 1 tablet (5 mg total) by mouth every 4 (four) hours as needed for severe pain (pain score 7-10). 03/16/24   Tegeler, Lonni PARAS, MD  Semaglutide ,0.25 or 0.5MG /DOS, (OZEMPIC , 0.25 OR 0.5 MG/DOSE,) 2 MG/3ML SOPN Inject 0.25 mg into the skin once a week. 06/03/24   Shamleffer,  Ibtehal Jaralla, MD  pioglitazone  (ACTOS ) 30 MG tablet Take 1 tablet (30 mg total) by mouth daily. 01/01/24     rosuvastatin  (CRESTOR ) 40 MG tablet Take 1 tablet (40 mg total) by mouth daily. 01/01/24   Shamleffer, Ibtehal Jaralla, MD  Semaglutide ,0.25 or 0.5MG /DOS, 2 MG/3ML SOPN Inject 0.5 mg into the skin once a week. 01/01/24   Shamleffer, Donell Cardinal, MD    Physical Exam: Vitals:   07/04/24 1715 07/04/24 1745 07/04/24 1800 07/04/24 1915  BP: (!) 173/83 (!) 183/89 (!) 183/102 (!) 179/122  Pulse: (!) 107 (!) 108 (!) 111 (!) 112  Resp: 19 (!) 35 (!) 36 (!) 35  Temp: 98.8 F (37.1 C)   98.7 F (37.1 C)  TempSrc: Oral     SpO2: 93% 95% 97% 97%   Physical Exam Vitals reviewed.  Constitutional:      Appearance: He is normal weight.  HENT:  Head: Normocephalic.     Nose: Nose normal.     Mouth/Throat:     Mouth: Mucous membranes are moist.     Pharynx: Oropharynx is clear.  Eyes:     Extraocular Movements: Extraocular movements intact.     Conjunctiva/sclera: Conjunctivae normal.     Pupils: Pupils are equal, round, and reactive to light.  Cardiovascular:     Rate and Rhythm: Normal rate and regular rhythm.     Pulses: Normal pulses.  Pulmonary:     Effort: Pulmonary effort is normal.  Abdominal:     General: Abdomen is flat.     Palpations: Abdomen is soft.  Musculoskeletal:        General: Normal range of motion.  Skin:    General: Skin is warm.     Capillary Refill: Capillary refill takes less than 2 seconds.  Neurological:     General: No focal deficit present.     Mental Status: He is alert.       Labs on Admission: I have personally reviewed the patients's labs and imaging studies.  Assessment/Plan Principal Problem:   Influenza   # Acute hypoxic respiratory failure most likely secondary to influenza - Patient tachycardic febrile - Requiring 4 L nasal cannula  Plan: Start Tamiflu  Resp eval Wean oxygen as able  #HTN- continue losartan   #  chronic pain- nrco  #hld- crestor    Admission status: Inpatient Med-Surg  Certification: The appropriate patient status for this patient is INPATIENT. Inpatient status is judged to be reasonable and necessary in order to provide the required intensity of service to ensure the patient's safety. The patient's presenting symptoms, physical exam findings, and initial radiographic and laboratory data in the context of their chronic comorbidities is felt to place them at high risk for further clinical deterioration. Furthermore, it is not anticipated that the patient will be medically stable for discharge from the hospital within 2 midnights of admission.   * I certify that at the point of admission it is my clinical judgment that the patient will require inpatient hospital care spanning beyond 2 midnights from the point of admission due to high intensity of service, high risk for further deterioration and high frequency of surveillance required.DEWAINE Lamar Dess MD [facility] If 7PM-7AM, please contact night-coverage [email or web address]  07/04/2024, 9:51 PM        [1] No Known Allergies  "

## 2024-07-04 NOTE — ED Notes (Addendum)
 Pt urinated in his pants and advised ED staff of same. ED staff attempted to help the pt change into clean clothes, but the pt refused. Pt was asked multiple time but the pt adamantly refused. Urinal left at bedside

## 2024-07-05 ENCOUNTER — Encounter (HOSPITAL_COMMUNITY): Payer: Self-pay | Admitting: Internal Medicine

## 2024-07-05 DIAGNOSIS — J111 Influenza due to unidentified influenza virus with other respiratory manifestations: Secondary | ICD-10-CM | POA: Diagnosis not present

## 2024-07-05 LAB — COMPREHENSIVE METABOLIC PANEL WITH GFR
ALT: 56 U/L — ABNORMAL HIGH (ref 0–44)
AST: 26 U/L (ref 15–41)
Albumin: 4.3 g/dL (ref 3.5–5.0)
Alkaline Phosphatase: 71 U/L (ref 38–126)
Anion gap: 13 (ref 5–15)
BUN: 24 mg/dL — ABNORMAL HIGH (ref 8–23)
CO2: 25 mmol/L (ref 22–32)
Calcium: 9.5 mg/dL (ref 8.9–10.3)
Chloride: 98 mmol/L (ref 98–111)
Creatinine, Ser: 1.77 mg/dL — ABNORMAL HIGH (ref 0.61–1.24)
GFR, Estimated: 42 mL/min — ABNORMAL LOW
Glucose, Bld: 188 mg/dL — ABNORMAL HIGH (ref 70–99)
Potassium: 4.8 mmol/L (ref 3.5–5.1)
Sodium: 135 mmol/L (ref 135–145)
Total Bilirubin: 0.6 mg/dL (ref 0.0–1.2)
Total Protein: 7.3 g/dL (ref 6.5–8.1)

## 2024-07-05 LAB — GLUCOSE, CAPILLARY
Glucose-Capillary: 167 mg/dL — ABNORMAL HIGH (ref 70–99)
Glucose-Capillary: 173 mg/dL — ABNORMAL HIGH (ref 70–99)
Glucose-Capillary: 171 mg/dL — ABNORMAL HIGH (ref 70–99)
Glucose-Capillary: 193 mg/dL — ABNORMAL HIGH (ref 70–99)

## 2024-07-05 LAB — CBC
HCT: 48 % (ref 39.0–52.0)
Hemoglobin: 15.1 g/dL (ref 13.0–17.0)
MCH: 28.2 pg (ref 26.0–34.0)
MCHC: 31.5 g/dL (ref 30.0–36.0)
MCV: 89.7 fL (ref 80.0–100.0)
Platelets: 152 K/uL (ref 150–400)
RBC: 5.35 MIL/uL (ref 4.22–5.81)
RDW: 14.3 % (ref 11.5–15.5)
WBC: 9.8 K/uL (ref 4.0–10.5)
nRBC: 0 % (ref 0.0–0.2)

## 2024-07-05 LAB — PROTIME-INR
INR: 1 (ref 0.8–1.2)
Prothrombin Time: 13.5 s (ref 11.4–15.2)

## 2024-07-05 LAB — HEMOGLOBIN A1C
Hgb A1c MFr Bld: 8.5 % — ABNORMAL HIGH (ref 4.8–5.6)
Mean Plasma Glucose: 197.25 mg/dL

## 2024-07-05 MED ORDER — IPRATROPIUM-ALBUTEROL 0.5-2.5 (3) MG/3ML IN SOLN: 3.0000 mL | RESPIRATORY_TRACT | Status: DC | PRN

## 2024-07-05 MED ORDER — ONDANSETRON HCL 4 MG/2ML IJ SOLN: 4.0000 mg | Freq: Four times a day (QID) | INTRAMUSCULAR | Status: DC | PRN

## 2024-07-05 MED ORDER — INSULIN ASPART 100 UNIT/ML IJ SOLN
0.0000 [IU] | Freq: Three times a day (TID) | INTRAMUSCULAR | Status: DC
  Administered 2024-07-05 (×2): 2 [IU] via SUBCUTANEOUS
  Filled 2024-07-05 (×2): qty 2

## 2024-07-05 MED ORDER — GUAIFENESIN-DM 100-10 MG/5ML PO SYRP: 10.0000 mL | ORAL_SOLUTION | ORAL | Status: DC | PRN

## 2024-07-05 MED ORDER — HYDRALAZINE HCL 25 MG PO TABS: 25.0000 mg | ORAL_TABLET | Freq: Four times a day (QID) | ORAL | Status: DC | PRN

## 2024-07-05 MED ORDER — INSULIN ASPART 100 UNIT/ML IJ SOLN: 0.0000 [IU] | Freq: Every day | INTRAMUSCULAR | Status: DC

## 2024-07-05 NOTE — Progress Notes (Signed)
 " PROGRESS NOTE    Ryan Farmer  FMW:[id number] DOB: 1956-05-14 DOA: 07/04/2024 PCP: System, Provider Not In    Brief Narrative:   Ryan Farmer is a 68 y.o. male with past medical history significant for HTN, CAD, DM2, CKD stage IIIa, history of tobacco use who presented to [facility] ED on 07/04/2024 with shortness of breath, fever, cough and generalized unwell feeling over the last 3 days.  Has been taking Tylenol  at home without relief.  In the ED, temperature 98.8 F, HR 108, RR 35, BP 183/89, SpO2 93% on 2 L nasal cannula.  WBC 10.0, hemoglobin 16.4, platelet count 173.  Sodium 134, potassium 4.8, chloride 98, CO2 25, glucose 176, BUN 27, creat 1.72.  AST 29, ALT 73, total Ruben 0.6.  High sensitivity troponin 25 followed by 26, BNP 92.4.  Influenza A PCR positive.  Influenza B, RSV PCR, Cova-19 PCR negative.  Chest x-ray with findings suggestive of viral bronchitis versus reactive airway disease.  TRH consulted for admission for further evaluation and management of acute hypoxic respiratory failure secondary to influenza A viral pneumonia.  Assessment & Plan:   Acute hypoxic respiratory failure, POA Influenza A viral pneumonia Presenting with 3-day history of progressive shortness of breath, fever cough and generalized weakness/unwell feeling.  Patient was tachycardic, tachypneic and notably requiring oxygen at time of ED presentation.  WC count 10.0.  Influenza A PCR positive.  Checks x-ray consistent with viral bronchitis versus reactive airway disease.  CT angiogram chest negative for PE. -- Tamiflu  30 mg p.o. twice daily -- DuoNeb every 4 hours as needed wheezing/shortness of breath -- Incentive spirometry/flutter valve -- Continue supplemental oxygen, maintain SpO2 greater than 88% -- Droplet precautions  Pulmonary nodule/hilar adenopathy CT angiogram chest negative for PE, noted posterior left upper lobe nodule measuring 7 x 10 mm, left hilar lymphadenopathy 2 x 2.5  cm. -- Will need repeat imaging outpatient following resolution of infection, consideration for CT PET scan to rule out malignancy  HTN -- Losartan  20 mg p.o. daily -- Hydralazine  25 g p.o. every 6 hours as needed SBP greater than 165  CAD -- Crestor  40 m p.o. daily  DM2 Follows with endocrinology outpatient, Dr. Callie.  Currently on insulin  pump, Farxiga  and Ozempic .  Hemoglobin A1c 8.5%. -- Diabetic educator consult -- Sensitive SSI for coverage -- CBG before every meal/at bedtime  Hx renal cell carcinoma s/p left nephrectomy CKD stage IIIa Baseline creatinine 1.8-2.0. -- Cr 1.72>1.77; stable  Tobacco use disorder Counseled on need for complete cessation   DVT prophylaxis: enoxaparin  (LOVENOX ) injection 40 mg Start: 07/04/24 2200 SCDs Start: 07/04/24 2148    Code Status: Full Code Family Communication: No family present at bedside  Disposition Plan:  Level of care: Med-Surg Status is: Inpatient Remains inpatient appropriate because: Needs weaning from supplemental oxygen    Consultants:  None  Procedures:  None  Antimicrobials:  None   Subjective: Patient seen examined bedside, lying in bed.  Continues with dyspnea, remains on 4 L nasal cannula.  Additionally endorses cough and generalized weakness.  No family present.  No other questions or concerns at this time.  Denies headache, no dizziness, no chest pain, no palpitations, no fever/chills/night sweats, no nausea/vomiting/diarrhea, no focal weakness, no fatigue, no paresthesia.  No acute events overnight per nursing staff.  Objective: Vitals:   07/05/24 0443 07/05/24 0445 07/05/24 0954 07/05/24 1157  BP: (!) 148/77 (!) 148/77 121/72 (!) 183/70  Pulse: (!) 111 (!) 110 ROLLEN)  108 (!) 110  Resp: (!) 28 (!) 28 20   Temp: 99.6 F (37.6 C) 99.6 F (37.6 C) 99.9 F (37.7 C) 98.4 F (36.9 C)  TempSrc: Oral Oral Oral   SpO2: 98% 98% 96% 95%  Weight: 100.2 kg 100.2 kg    Height: 6' (1.829 m) 6' (1.829 m)       Intake/Output Summary (Last 24 hours) at 07/05/2024 1335 Last data filed at 07/05/2024 1300 Gross per 24 hour  Intake 780 ml  Output --  Net 780 ml   Filed Weights   07/04/24 2100 07/05/24 0443 07/05/24 0445  Weight: 101.2 kg 100.2 kg 100.2 kg    Examination:  Physical Exam: GEN: NAD, alert and oriented x 3, ill in appearance, appears older than stated age HEENT: NCAT, PERRL, EOMI, sclera clear, MMM PULM: Coarse breath sounds bilaterally, slightly diminished bilateral bases, normal respiratory effort without accessory muscle use, on 4 L nasal cannula with SpO2 95% at rest. CV: Tachycardic, regular rhythm w/o M/G/R GI: abd soft, NTND, + BS MSK: no peripheral edema, moves all extremities independently NEURO: No focal neurological deficit PSYCH: normal mood/affect Integumentary: No concerning rashes/skin/wounds noted exposed skin surfaces    Data Reviewed: I have personally reviewed following labs and imaging studies  CBC: Recent Labs  Lab 07/04/24 1753 07/05/24 0411  WBC 10.0 9.8  NEUTROABS 8.6*  --   HGB 16.4 15.1  HCT 51.7 48.0  MCV 89.1 89.7  PLT 173 152   Basic Metabolic Panel: Recent Labs  Lab 07/04/24 1753 07/05/24 0411  NA 134* 135  K 4.8 4.8  CL 98 98  CO2 25 25  GLUCOSE 176* 188*  BUN 27* 24*  CREATININE 1.72* 1.77*  CALCIUM  9.7 9.5   GFR: Estimated Creatinine Clearance: 49.6 mL/min (A) (by C-G formula based on SCr of 1.77 mg/dL (H)). Liver Function Tests: Recent Labs  Lab 07/04/24 1753 07/05/24 0411  AST 29 26  ALT 73* 56*  ALKPHOS 83 71  BILITOT 0.6 0.6  PROT 8.1 7.3  ALBUMIN 4.6 4.3   No results for input(s): LIPASE, AMYLASE in the last 168 hours. No results for input(s): AMMONIA in the last 168 hours. Coagulation Profile: Recent Labs  Lab 07/05/24 0411  INR 1.0   Cardiac Enzymes: No results for input(s): CKTOTAL, CKMB, CKMBINDEX, TROPONINI in the last 168 hours. BNP (last 3 results) Recent Labs     07/04/24 1753  PROBNP 92.4   HbA1C: Recent Labs    07/05/24 0411  HGBA1C 8.5*   CBG: Recent Labs  Lab 07/05/24 0746 07/05/24 1159  GLUCAP 167* 173*   Lipid Profile: No results for input(s): CHOL, HDL, LDLCALC, TRIG, CHOLHDL, LDLDIRECT in the last 72 hours. Thyroid  Function Tests: No results for input(s): TSH, T4TOTAL, FREET4, T3FREE, THYROIDAB in the last 72 hours. Anemia Panel: No results for input(s): VITAMINB12, FOLATE, FERRITIN, TIBC, IRON, RETICCTPCT in the last 72 hours. Sepsis Labs: No results for input(s): PROCALCITON, LATICACIDVEN in the last 168 hours.  Recent Results (from the past 240 hours)  Resp panel by RT-PCR (RSV, Flu A&B, Covid) Anterior Nasal Swab     Status: Abnormal   Collection Time: 07/04/24  5:11 PM   Specimen: Anterior Nasal Swab  Result Value Ref Range Status   SARS Coronavirus 2 by RT PCR NEGATIVE NEGATIVE Final    Comment: (NOTE) SARS-CoV-2 target nucleic acids are NOT DETECTED.  The SARS-CoV-2 RNA is generally detectable in upper respiratory specimens during the acute phase of infection. The lowest  concentration of SARS-CoV-2 viral copies this assay can detect is 138 copies/mL. A negative result does not preclude SARS-Cov-2 infection and should not be used as the sole basis for treatment or other patient management decisions. A negative result may occur with  improper specimen collection/handling, submission of specimen other than nasopharyngeal swab, presence of viral mutation(s) within the areas targeted by this assay, and inadequate number of viral copies(<138 copies/mL). A negative result must be combined with clinical observations, patient history, and epidemiological information. The expected result is Negative.  Fact Sheet for Patients:  bloggercourse.com  Fact Sheet for Healthcare Providers:  seriousbroker.it  This test is no t yet approved  or cleared by the [place]  FDA and  has been authorized for detection and/or diagnosis of SARS-CoV-2 by FDA under an Emergency Use Authorization (EUA). This EUA will remain  in effect (meaning this test can be used) for the duration of the COVID-19 declaration under Section 564(b)(1) of the Act, 21 U.S.C.section 360bbb-3(b)(1), unless the authorization is terminated  or revoked sooner.       Influenza A by PCR POSITIVE (A) NEGATIVE Final   Influenza B by PCR NEGATIVE NEGATIVE Final    Comment: (NOTE) The Xpert Xpress SARS-CoV-2/FLU/RSV plus assay is intended as an aid in the diagnosis of influenza from Nasopharyngeal swab specimens and should not be used as a sole basis for treatment. Nasal washings and aspirates are unacceptable for Xpert Xpress SARS-CoV-2/FLU/RSV testing.  Fact Sheet for Patients: bloggercourse.com  Fact Sheet for Healthcare Providers: seriousbroker.it  This test is not yet approved or cleared by the [place]  FDA and has been authorized for detection and/or diagnosis of SARS-CoV-2 by FDA under an Emergency Use Authorization (EUA). This EUA will remain in effect (meaning this test can be used) for the duration of the COVID-19 declaration under Section 564(b)(1) of the Act, 21 U.S.C. section 360bbb-3(b)(1), unless the authorization is terminated or revoked.     Resp Syncytial Virus by PCR NEGATIVE NEGATIVE Final    Comment: (NOTE) Fact Sheet for Patients: bloggercourse.com  Fact Sheet for Healthcare Providers: seriousbroker.it  This test is not yet approved or cleared by the [place]  FDA and has been authorized for detection and/or diagnosis of SARS-CoV-2 by FDA under an Emergency Use Authorization (EUA). This EUA will remain in effect (meaning this test can be used) for the duration of the COVID-19 declaration under Section 564(b)(1) of the  Act, 21 U.S.C. section 360bbb-3(b)(1), unless the authorization is terminated or revoked.  Performed at [facility], [address] [place], [place] [address]          Radiology Studies: CT Angio Chest PE W and/or Wo Contrast Result Date: 07/04/2024 EXAM: CTA CHEST 07/04/2024 07:54:43 PM TECHNIQUE: CTA of the chest was performed without and with the administration of 60 mL of iohexol  (OMNIPAQUE ) 350 MG/ML injection. Multiplanar reformatted images are provided for review. MIP images are provided for review. Automated exposure control, iterative reconstruction, and/or weight based adjustment of the mA/kV was utilized to reduce the radiation dose to as low as reasonably achievable. Motion degraded study. COMPARISON: 09/27/2023 CLINICAL HISTORY: Pulmonary embolism (PE) suspected, high prob. FINDINGS: PULMONARY ARTERIES: Pulmonary arteries are adequately opacified for evaluation. No acute pulmonary embolus. Main pulmonary artery is normal in caliber. MEDIASTINUM: The heart and pericardium demonstrate no acute abnormality. There is no acute abnormality of the thoracic aorta. LYMPH NODES: Enlargement of the left hilar and AP window lymph nodes. For example, the AP window lymph node measures  2.2 x 2.5 cm (axial 59). Left suprahilar lymph node measures 1.6 x 2 cm (axial 66). There is also a prominent right infrahilar lymph node measuring 1 cm. No axillary lymphadenopathy. LUNGS AND PLEURA: Redemonstrated left upper lobe nodule abutting fissure measuring approximately 7 x 10 mm (axial 58). No focal consolidation or pulmonary edema. No evidence of pleural effusion or pneumothorax. UPPER ABDOMEN: Limited images of the upper abdomen are unremarkable. SOFT TISSUES AND BONES: Thoracic disc with multilevel degenerative disc disease of the visualized spine. No acute soft tissue abnormality. IMPRESSION: 1. No pulmonary embolism, pneumonia, pulmonary edema, or pleural effusion. 2. Interval  development of AP window and left hilar lymphadenopathy. The AP window lymph node is the largest, measuring 2.2x2.5 cm. A prominent right infrahilar lymph node is also present. While these could be reactive, they are worrisome for developing metastatic disease. A nonemergent PET CT should be considered for further characterization. 3. Posterior left upper lobe nodule present measuring 7 x 10 mm . This should also be further evaluated on the aforementioned PET CT to exclude metastatic disease. Electronically signed by: Rogelia Myers MD 07/04/2024 08:41 PM EST RP Workstation: [id number]   DG Chest 2 View Result Date: 07/04/2024 CLINICAL DATA:  Fever, cough and feeling unwell x3 days. EXAM: CHEST - 2 VIEW COMPARISON:  January 05, 2024 FINDINGS: The heart size and mediastinal contours are within normal limits. There is mild peribronchial cuffing with mildly increased suprahilar and infrahilar lung markings noted bilaterally. No focal consolidation, pleural effusion or pneumothorax is identified. The visualized skeletal structures are unremarkable. IMPRESSION: Findings suggestive of viral bronchitis versus reactive airway disease. Electronically Signed   By: Suzen Dials M.D.   On: 07/04/2024 18:05        Scheduled Meds:  enoxaparin  (LOVENOX ) injection  40 mg Subcutaneous Q24H   insulin  aspart  0-5 Units Subcutaneous QHS   insulin  aspart  0-9 Units Subcutaneous TID WC   losartan   25 mg Oral Daily   oseltamivir   30 mg Oral BID   rosuvastatin   40 mg Oral Daily   Continuous Infusions:   LOS: 1 day    Time spent: 52 minutes spent on 07/05/2024 caring for this patient face-to-face including chart review, ordering labs/tests, documenting, discussion with nursing staff, consultants, updating family and interview/physical exam    Camellia PARAS Candy Leverett, DO [facility] Available via Epic secure chat 7am-7pm After these hours, please refer to coverage provider listed on [email or web address] 07/05/2024, 1:35  PM   "

## 2024-07-05 NOTE — Plan of Care (Signed)
  Problem: Education: Goal: Knowledge of General Education information will improve Description: Including pain rating scale, medication(s)/side effects and non-pharmacologic comfort measures Outcome: Progressing   Problem: Safety: Goal: Ability to remain free from injury will improve Outcome: Progressing   Problem: Pain Managment: Goal: General experience of comfort will improve and/or be controlled Outcome: Progressing

## 2024-07-05 NOTE — Inpatient Diabetes Management (Signed)
 Inpatient Diabetes Program Recommendations  AACE/ADA: New Consensus Statement on Inpatient Glycemic Control (2015)  Target Ranges:  Prepandial:   less than 140 mg/dL      Peak postprandial:   less than 180 mg/dL (1-2 hours)      Critically ill patients:  140 - 180 mg/dL   Lab Results  Component Value Date   GLUCAP 173 (H) 07/05/2024   HGBA1C 8.5 (H) 07/05/2024    Review of Glycemic Control  Diabetes history: DM2 Outpatient Diabetes medications: OmniPod with Dexcom G7, Ozempic  0.5 units weekly, Farxiga  10 mg daily Current orders for Inpatient glycemic control: Novolog  0-9 TID with meals and 0-5 HS  HgbA1C 8.5%  Insulin  pump:  Basal   1.15/H  Bolus:  I:C ratio 1:1 (10 grams with each meal) Sensitivity 25 Goal 130 mg/dL  Inpatient Diabetes Program Recommendations:    Consider:  Semglee  25 units QHS  Novolog  0-9 TID with meals  Novolog  6 units TID with meals if eating > 50%  Spoke with pt at bedside regarding his diabetes and insulin  pump. States OmniPod controller is at home, pulled out pod from arm as it was beeping which means low reservoir.  Therefore he will need both basal and bolus insulin  while inpatient.  Pt kept saying that he was ready to go home, he's very restless and wants to know if he can go today. RN and this Coordinator explained that he needs to stay another day or two d/t his oxygen needs.  Will continue to follow.  Thank you. Shona Brandy, RD, LDN, CDCES Inpatient Diabetes Coordinator [phone]

## 2024-07-05 NOTE — Plan of Care (Signed)
" °  Problem: Education: Goal: Knowledge of General Education information will improve Description: Including pain rating scale, medication(s)/side effects and non-pharmacologic comfort measures Outcome: Progressing   Problem: Health Behavior/Discharge Planning: Goal: Ability to manage health-related needs will improve Outcome: Progressing   Problem: Clinical Measurements: Goal: Ability to maintain clinical measurements within normal limits will improve Outcome: Progressing Goal: Will remain free from infection Outcome: Progressing Goal: Diagnostic test results will improve Outcome: Progressing Goal: Respiratory complications will improve Outcome: Progressing Goal: Cardiovascular complication will be avoided Outcome: Progressing   Problem: Activity: Goal: Risk for activity intolerance will decrease Outcome: Progressing   Problem: Nutrition: Goal: Adequate nutrition will be maintained Outcome: Progressing   Problem: Coping: Goal: Level of anxiety will decrease Outcome: Progressing   Problem: Elimination: Goal: Will not experience complications related to bowel motility Outcome: Progressing Goal: Will not experience complications related to urinary retention Outcome: Progressing   Problem: Pain Managment: Goal: General experience of comfort will improve and/or be controlled Outcome: Progressing   Problem: Safety: Goal: Ability to remain free from injury will improve Outcome: Progressing   Problem: Skin Integrity: Goal: Risk for impaired skin integrity will decrease Outcome: Progressing   Problem: Nutrition: Goal: Adequate nutrition will be maintained Outcome: Progressing   Problem: Pain Managment: Goal: General experience of comfort will improve and/or be controlled Outcome: Progressing   Problem: Education: Goal: Ability to describe self-care measures that may prevent or decrease complications (Diabetes Survival Skills Education) will improve Outcome:  Progressing Goal: Individualized Educational Video(s) Outcome: Progressing   Problem: Coping: Goal: Ability to adjust to condition or change in health will improve Outcome: Progressing   Problem: Fluid Volume: Goal: Ability to maintain a balanced intake and output will improve Outcome: Progressing   Problem: Health Behavior/Discharge Planning: Goal: Ability to identify and utilize available resources and services will improve Outcome: Progressing Goal: Ability to manage health-related needs will improve Outcome: Progressing   Problem: Metabolic: Goal: Ability to maintain appropriate glucose levels will improve Outcome: Progressing   Problem: Nutritional: Goal: Maintenance of adequate nutrition will improve Outcome: Progressing Goal: Progress toward achieving an optimal weight will improve Outcome: Progressing   Problem: Skin Integrity: Goal: Risk for impaired skin integrity will decrease Outcome: Progressing   Problem: Tissue Perfusion: Goal: Adequacy of tissue perfusion will improve Outcome: Progressing   "

## 2024-07-06 ENCOUNTER — Ambulatory Visit (HOSPITAL_COMMUNITY)
Admission: EM | Admit: 2024-07-06 | Discharge: 2024-07-06 | Disposition: A | Discharge status: Home / Self Care | Attending: Emergency Medicine | Admitting: Emergency Medicine

## 2024-07-06 DIAGNOSIS — J101 Influenza due to other identified influenza virus with other respiratory manifestations: Secondary | ICD-10-CM | POA: Diagnosis not present

## 2024-07-06 DIAGNOSIS — J111 Influenza due to unidentified influenza virus with other respiratory manifestations: Secondary | ICD-10-CM | POA: Diagnosis not present

## 2024-07-06 MED ORDER — OSELTAMIVIR PHOSPHATE 30 MG PO CAPS: 30.0000 mg | ORAL_CAPSULE | Freq: Two times a day (BID) | ORAL | 0 refills | Status: AC

## 2024-07-06 NOTE — Progress Notes (Signed)
" ° ° °  Against Medical Advice  Notified by bedside RN that the patient has eloped.   Per RN staff, he was last seen A/O at 11 PM.  He was not found in his assigned hospital room during routine rounding at midnight. An IV catheter was found left behind in the room. No significant personal belongings found.  Nursing staff attempted to call the patient, however no answer.  RN reports patient gave no direct notification of his intention to leave hospital care.   RN reported to me that hospital security confirms patient left hospital grounds.    Given the circumstances and absence of communication, it is presumed that the patient eloped and left hospital care AGAINST MEDICAL ADVICE.   Amous Crewe, DNP, ACNPC- AG [facility] [place]     "

## 2024-07-06 NOTE — ED Provider Notes (Signed)
 " [facility]    CSN: [id number] Arrival date & time: 07/06/24  1644      History   Chief Complaint Chief Complaint  Patient presents with   Cough   Shortness of Breath    HPI Ryan Farmer is a 68 y.o. male.   Patient presents to urgent care requesting Tamiflu .  Patient was admitted to the hospital and tested positive for flu while he was admitted.  Patient reports that he did receive 2 tablets of Tamiflu  while he was in the hospital already.  Patient did elope and leave AMA during his admission.    Upon asking patient why he left he stated that he felt fine to manage his flu symptoms at home.  While he was admitted it was noted that he was hypoxic.  While he was here his SpO2 was 97% and he does not appear to be short of breath or in any acute distress at this time.  He also had a chest CT while he was admitted which did reveal some concerning lung nodules.  It was recommended that patient have repeat imaging of this once he is feeling better.  Patient reports that he does have a primary care provider.  Patient reports that he feels overall fine at this time.  Patient states that he does feel mildly short of breath but not as bad as he did a few days ago. Of note patient does have a history of diabetes and while he was admitted they were concerned about some elevated readings, these did remain below 200 while he was in the hospital.  The history is provided by the patient and medical records.  Cough Associated symptoms: shortness of breath   Shortness of Breath Associated symptoms: cough     Past Medical History:  Diagnosis Date   Arthritis    All over   CAD (coronary artery disease), native coronary artery, minimal by cardiac cath 01/08/19 01/09/2019   Chronic pain syndrome    Diabetes mellitus    History of cocaine  use    History of kidney stones    Hypercholesteremia    Hypertension    Low back pain    Lumbar radiculopathy    Right leg pain    Seborrheic  dermatitis of scalp    Shingles     Patient Active Problem List   Diagnosis Date Noted   Influenza 07/04/2024   Long term current use of oral hypoglycemic drug 03/16/2023   Dyslipidemia 03/16/2023   Type 2 diabetes mellitus with stage 3b chronic kidney disease, with long-term current use of insulin  (HCC) 03/16/2023   Renal mass 05/13/2022   Spinal stenosis of lumbosacral region 04/21/2021   Spondylolisthesis, lumbar region    Palmar wart 04/05/2021   Acute non-recurrent maxillary sinusitis 04/05/2021   Type 2 diabetes mellitus with hyperglycemia, with long-term current use of insulin  (HCC) 08/20/2020   Type 2 diabetes mellitus with microalbuminuria, with long-term current use of insulin  (HCC) 10/14/2019   Type 2 diabetes mellitus with diabetic polyneuropathy, with long-term current use of insulin  (HCC) 04/15/2019   Hypertriglyceridemia 04/15/2019   Spinal stenosis of lumbar region with neurogenic claudication 03/07/2019   Lumbar radiculopathy 03/07/2019   Chronic bilateral low back pain with bilateral sciatica 03/07/2019   CAD (coronary artery disease), native coronary artery, minimal by cardiac cath 01/08/19 01/09/2019   Syncope and collapse    Spondylosis without myelopathy or radiculopathy, lumbar region 04/26/2018   Cocaine  abuse (HCC) 12/19/2016   Shingles outbreak  12/13/2016   Diabetes type 2, uncontrolled 04/19/2016   Metatarsalgia 10/19/2015   Back muscle spasm 11/18/2013   Hyperlipidemia 11/18/2013   HTN (hypertension) 11/22/2012   Chronic pain syndrome 11/22/2012   Tobacco use disorder 11/22/2012   Dental caries 11/22/2012    Past Surgical History:  Procedure Laterality Date   CIRCUMCISION N/A 03/16/2018   Procedure: CIRCUMCISION ADULT WITH PENILE BLOCK;  Surgeon: Alvaro Hummer, MD;  Location: [facility];  Service: Urology;  Laterality: N/A;  45 MINS   KNEE ARTHROSCOPY Bilateral    LEFT HEART CATH AND CORONARY ANGIOGRAPHY N/A 01/08/2019   Procedure:  LEFT HEART CATH AND CORONARY ANGIOGRAPHY;  Surgeon: Burnard Debby LABOR, MD;  Location: [facility];  Service: Cardiovascular;  Laterality: N/A;   LYMPH NODE DISSECTION Left 05/13/2022   Procedure: RETROPERITONEAL LYMPH NODE DISSECTIONLYMPH NODE DISSECTION;  Surgeon: Alvaro Hummer, MD;  Location: [facility];  Service: Urology;  Laterality: Left;   ROBOT ASSISTED LAPAROSCOPIC NEPHRECTOMY Left 05/13/2022   Procedure: XI ROBOTIC ASSISTED LAPAROSCOPIC NEPHRECTOMY;  Surgeon: Alvaro Hummer, MD;  Location: [facility];  Service: Urology;  Laterality: Left;  3 HRS       Home Medications    Prior to Admission medications  Medication Sig Start Date End Date Taking? Authorizing Provider  oseltamivir  (TAMIFLU ) 30 MG capsule Take 1 capsule (30 mg total) by mouth 2 (two) times daily for 4 days. 07/06/24 07/10/24 Yes Khaylee Mcevoy A, NP  acetaminophen  (TYLENOL ) 650 MG CR tablet Take 2-3 tablets by mouth daily as needed for pain.    [provider]  benzonatate  (TESSALON ) 100 MG capsule Take 100 mg by mouth 3 (three) times daily as needed. 05/31/24   [provider]  ciprofloxacin (CIPRO) 500 MG tablet Take 500 mg by mouth 2 (two) times daily. 06/18/24   [provider]  Continuous Glucose Sensor (DEXCOM G7 SENSOR) MISC Use as directed. Change sensor every 10 days 03/26/24   Shamleffer, Donell Cardinal, MD  Continuous Glucose Sensor (DEXCOM G7 SENSOR) MISC Use as directed. 01/11/24     cyclobenzaprine  (FLEXERIL ) 10 MG tablet Take 1 tablet (10 mg total) by mouth 2 (two) times daily as needed for muscle spasms. 03/16/24   Tegeler, Lonni PARAS, MD  dapagliflozin  propanediol (FARXIGA ) 10 MG TABS tablet Take 1 tablet (10 mg total) by mouth daily. 01/01/24   Shamleffer, Ibtehal Jaralla, MD  dapagliflozin  propanediol (FARXIGA ) 10 MG TABS tablet Take 1 tablet (10 mg total) by mouth daily. Patient not taking: Reported on 07/05/2024 01/01/24     fluticasone  (FLONASE ) 50 MCG/ACT nasal spray Place 2  sprays into both nostrils daily. 03/15/24   Dreama, [place]  N, FNP  glucose blood (ACCU-CHEK GUIDE TEST) test strip USE THREE TIMES DAILY AS DIRECTED 07/06/23   Shamleffer, Ibtehal Jaralla, MD  HYDROcodone -acetaminophen  (NORCO/VICODIN) 5-325 MG tablet Take 1-2 tablets by mouth every 6 (six) hours as needed. 03/15/24   Dreama, [place]  N, FNP  Insulin  Disposable Pump (OMNIPOD 5 G7 PODS, GEN 5,) MISC Apply as directed every other day. 03/26/24   Shamleffer, Ibtehal Jaralla, MD  insulin  lispro (HUMALOG ) 100 UNIT/ML injection Inject a maximum of 60 Units total into the skin per day. 10/31/23   Shamleffer, Ibtehal Jaralla, MD  insulin  lispro (HUMALOG ) 100 UNIT/ML injection Inject up to 1 mL (100 Units total) into the skin daily. 05/23/24   Shamleffer, Ibtehal Jaralla, MD  Insulin  Lispro Prot & Lispro (HUMALOG  75/25 MIX) (75-25) 100 UNIT/ML Kwikpen Inject 36 Units into the skin in the morning  and at bedtime. 08/19/23   Shamleffer, Donell Cardinal, MD  Insulin  Pen Needle 32G X 4 MM MISC Use to inject into the skin 2 (two) times daily. 05/23/24   Shamleffer, Ibtehal Jaralla, MD  ketoconazole  (NIZORAL ) 2 % shampoo Apply 1 Application topically 2 (two) times a week. 05/30/24   [provider]  lidocaine  (LIDODERM ) 5 % Place 1 patch onto the skin daily. Remove & Discard patch within 12 hours or as directed by MD 03/04/24   Darral Longs, MD  lidocaine  (LIDODERM ) 5 % Place 1 patch onto the skin daily. Remove & Discard patch within 12 hours or as directed by MD 03/16/24   Tegeler, Lonni PARAS, MD  losartan  (COZAAR ) 25 MG tablet Take 1 tablet (25 mg total) by mouth daily. 01/01/24   Shamleffer, Ibtehal Jaralla, MD  losartan  (COZAAR ) 25 MG tablet Take 1 tablet (25 mg total) by mouth daily. Patient not taking: Reported on 07/05/2024 01/01/24     oxyCODONE  (ROXICODONE ) 5 MG immediate release tablet Take 1 tablet (5 mg total) by mouth every 4 (four) hours as needed for severe pain (pain score 7-10). 03/16/24   Tegeler,  Lonni PARAS, MD  Oxycodone  HCl 10 MG TABS Take 10 mg by mouth 3 (three) times daily as needed. 06/22/24   [provider]  pioglitazone  (ACTOS ) 30 MG tablet Take 1 tablet (30 mg total) by mouth daily. 01/01/24     Polyethyl Glycol-Propyl Glycol (SYSTANE ULTRA OP) Place 1 drop into both eyes in the morning, at noon, in the evening, and at bedtime.    [provider]  rosuvastatin  (CRESTOR ) 40 MG tablet Take 1 tablet (40 mg total) by mouth daily. 01/01/24   Shamleffer, Ibtehal Jaralla, MD  Semaglutide ,0.25 or 0.5MG /DOS, (OZEMPIC , 0.25 OR 0.5 MG/DOSE,) 2 MG/3ML SOPN Inject 0.25 mg into the skin once a week. 06/03/24   Shamleffer, Donell Cardinal, MD    Family History Family History  Problem Relation Age of Onset   Cancer Mother    Stroke Father    Heart disease Father    Diabetes Father     Social History Social History[1]   Allergies   Patient has no known allergies.   Review of Systems Review of Systems  Respiratory:  Positive for cough and shortness of breath.    Per HPI  Physical Exam Triage Vital Signs ED Triage Vitals  Encounter Vitals Group     BP 07/06/24 1655 111/63     Girls Systolic BP Percentile --      Girls Diastolic BP Percentile --      Boys Systolic BP Percentile --      Boys Diastolic BP Percentile --      Pulse Rate 07/06/24 1655 95     Resp 07/06/24 1655 18     Temp 07/06/24 1655 98.1 F (36.7 C)     Temp Source 07/06/24 1655 Oral     SpO2 07/06/24 1655 97 %     Weight --      Height --      Head Circumference --      Peak Flow --      Pain Score 07/06/24 1657 0     Pain Loc --      Pain Education --      Exclude from Growth Chart --    No data found.  Updated Vital Signs BP 111/63 (BP Location: Left Arm)   Pulse 95   Temp 98.1 F (36.7 C) (Oral)   Resp 18  SpO2 97%   Visual Acuity Right Eye Distance:   Left Eye Distance:   Bilateral Distance:    Right Eye Near:   Left Eye Near:    Bilateral Near:      Physical Exam Vitals and nursing note reviewed.  Constitutional:      General: He is awake. He is not in acute distress.    Appearance: Normal appearance. He is well-developed and well-groomed. He is not ill-appearing, toxic-appearing or diaphoretic.  HENT:     Right Ear: Tympanic membrane, ear canal and external ear normal.     Left Ear: Tympanic membrane, ear canal and external ear normal.     Nose: Congestion and rhinorrhea present.     Mouth/Throat:     Mouth: Mucous membranes are moist.     Pharynx: Posterior oropharyngeal erythema present. No oropharyngeal exudate.  Cardiovascular:     Rate and Rhythm: Normal rate and regular rhythm.  Pulmonary:     Effort: Pulmonary effort is normal.     Breath sounds: Normal breath sounds.  Skin:    General: Skin is warm and dry.  Neurological:     General: No focal deficit present.     Mental Status: He is alert and oriented to person, place, and time. Mental status is at baseline.  Psychiatric:        Behavior: Behavior is cooperative.      UC Treatments / Results  Labs (all labs ordered are listed, but only abnormal results are displayed) Labs Reviewed - No data to display  EKG   Radiology CT Angio Chest PE W and/or Wo Contrast Result Date: 07/04/2024 EXAM: CTA CHEST 07/04/2024 07:54:43 PM TECHNIQUE: CTA of the chest was performed without and with the administration of 60 mL of iohexol  (OMNIPAQUE ) 350 MG/ML injection. Multiplanar reformatted images are provided for review. MIP images are provided for review. Automated exposure control, iterative reconstruction, and/or weight based adjustment of the mA/kV was utilized to reduce the radiation dose to as low as reasonably achievable. Motion degraded study. COMPARISON: 09/27/2023 CLINICAL HISTORY: Pulmonary embolism (PE) suspected, high prob. FINDINGS: PULMONARY ARTERIES: Pulmonary arteries are adequately opacified for evaluation. No acute pulmonary embolus. Main pulmonary artery is  normal in caliber. MEDIASTINUM: The heart and pericardium demonstrate no acute abnormality. There is no acute abnormality of the thoracic aorta. LYMPH NODES: Enlargement of the left hilar and AP window lymph nodes. For example, the AP window lymph node measures 2.2 x 2.5 cm (axial 59). Left suprahilar lymph node measures 1.6 x 2 cm (axial 66). There is also a prominent right infrahilar lymph node measuring 1 cm. No axillary lymphadenopathy. LUNGS AND PLEURA: Redemonstrated left upper lobe nodule abutting fissure measuring approximately 7 x 10 mm (axial 58). No focal consolidation or pulmonary edema. No evidence of pleural effusion or pneumothorax. UPPER ABDOMEN: Limited images of the upper abdomen are unremarkable. SOFT TISSUES AND BONES: Thoracic disc with multilevel degenerative disc disease of the visualized spine. No acute soft tissue abnormality. IMPRESSION: 1. No pulmonary embolism, pneumonia, pulmonary edema, or pleural effusion. 2. Interval development of AP window and left hilar lymphadenopathy. The AP window lymph node is the largest, measuring 2.2x2.5 cm. A prominent right infrahilar lymph node is also present. While these could be reactive, they are worrisome for developing metastatic disease. A nonemergent PET CT should be considered for further characterization. 3. Posterior left upper lobe nodule present measuring 7 x 10 mm . This should also be further evaluated on the aforementioned PET CT to  exclude metastatic disease. Electronically signed by: Rogelia Myers MD 07/04/2024 08:41 PM EST RP Workstation: [id number]   DG Chest 2 View Result Date: 07/04/2024 CLINICAL DATA:  Fever, cough and feeling unwell x3 days. EXAM: CHEST - 2 VIEW COMPARISON:  January 05, 2024 FINDINGS: The heart size and mediastinal contours are within normal limits. There is mild peribronchial cuffing with mildly increased suprahilar and infrahilar lung markings noted bilaterally. No focal consolidation, pleural effusion or  pneumothorax is identified. The visualized skeletal structures are unremarkable. IMPRESSION: Findings suggestive of viral bronchitis versus reactive airway disease. Electronically Signed   By: Suzen Dials M.D.   On: 07/04/2024 18:05    Procedures Procedures (including critical care time)  Medications Ordered in UC Medications - No data to display  Initial Impression / Assessment and Plan / UC Course  I have reviewed the triage vital signs and the nursing notes.  Pertinent labs & imaging results that were available during my care of the patient were reviewed by me and considered in my medical decision making (see chart for details).     Patient is overall well-appearing.  Vitals are stable.  SpO2 is 97% on room air.  Heart lung sounds normal.  Patient does not appear to be in any acute distress at this time.  Prescribed the 4 remaining days of Tamiflu  as patient requested.  Discussed importance of following up with PCP for repeat imaging based on CT results.  Discussed follow-up, return, and strict ER precautions. Final Clinical Impressions(s) / UC Diagnoses   Final diagnoses:  Influenza A     Discharge Instructions      Start taking Tamiflu  twice daily for 4 days for continued treatment of the flu. Please follow-up with your primary care provider for repeat imaging based on your CT results done in the hospital. If you develop worsening shortness of breath, severe chest pain, persistent fevers, persistently high blood sugar readings, severe weakness, or passing out please seek immediate medical treatment in the emergency department.    ED Prescriptions     Medication Sig Dispense Auth. Provider   oseltamivir  (TAMIFLU ) 30 MG capsule Take 1 capsule (30 mg total) by mouth 2 (two) times daily for 4 days. 8 capsule Johnie Flaming A, NP      PDMP not reviewed this encounter.    [1]  Social History Tobacco Use   Smoking status: Every Day    Current packs/day: 1.00     Average packs/day: 1 pack/day for 50.0 years (50.0 ttl pk-yrs)    Types: Cigarettes   Smokeless tobacco: Former    Types: Engineer, Drilling   Vaping status: Never Used  Substance Use Topics   Alcohol use: Not Currently    Comment: occ   Drug use: Not Currently    Types: Marijuana, Cocaine     Comment: twice a week     Johnie Flaming LABOR, NP 07/06/24 1735  "

## 2024-07-06 NOTE — Discharge Summary (Signed)
 " [facility] Physician Discharge Summary  Ryan Farmer FMW:[id number] DOB: 1956/05/26 DOA: 07/04/2024  PCP: System, Provider Not In  Admit date: 07/04/2024 Discharge date: 07/06/2024  Admitted From: Home Disposition: AMA/eloped   History of present illness:  Ryan Farmer is a 68 y.o. male with past medical history significant for HTN, CAD, DM2, CKD stage IIIa, history of tobacco use who presented to [facility] ED on 07/04/2024 with shortness of breath, fever, cough and generalized unwell feeling over the last 3 days.  Has been taking Tylenol  at home without relief.   In the ED, temperature 98.8 F, HR 108, RR 35, BP 183/89, SpO2 93% on 2 L nasal cannula.  WBC 10.0, hemoglobin 16.4, platelet count 173.  Sodium 134, potassium 4.8, chloride 98, CO2 25, glucose 176, BUN 27, creat 1.72.  AST 29, ALT 73, total Ruben 0.6.  High sensitivity troponin 25 followed by 26, BNP 92.4.  Influenza A PCR positive.  Influenza B, RSV PCR, Cova-19 PCR negative.  Chest x-ray with findings suggestive of viral bronchitis versus reactive airway disease.  TRH consulted for admission for further evaluation and management of acute hypoxic respiratory failure secondary to influenza A viral pneumonia.  Hospital course:  Acute hypoxic respiratory failure, POA Influenza A viral pneumonia Presenting with 3-day history of progressive shortness of breath, fever cough and generalized weakness/unwell feeling.  Patient was tachycardic, tachypneic and notably requiring oxygen at time of ED presentation.  WC count 10.0.  Influenza A PCR positive.  Checks x-ray consistent with viral bronchitis versus reactive airway disease.  CT angiogram chest negative for PE.  Patient was started on Tamiflu  and neb treatments as needed.   Unfortunately patient eloped not notifying any staff members that he was leaving.  IV catheter was found behind in the room with no significant personal belongings found.  Per nursing staff, laboratory tech  saw patient and was asked how to exit the building around 0010 on 07/06/2024.    Pulmonary nodule/hilar adenopathy CT angiogram chest negative for PE, noted posterior left upper lobe nodule measuring 7 x 10 mm, left hilar lymphadenopathy 2 x 2.5 cm.  Recommend repeat imaging outpatient following resolution of infection, consideration for CT PET scan to rule out malignancy   HTN Continue losartan    CAD Can you Crestor    DM2 Follows with endocrinology outpatient, Dr. Callie.  Currently on insulin  pump, Farxiga  and Ozempic .  Hemoglobin A1c 8.5%.   Hx renal cell carcinoma s/p left nephrectomy CKD stage IIIa Baseline creatinine 1.8-2.0. Cr 1.72>1.77; stable   Tobacco use disorder Counseled on need for complete cessation  Discharge Diagnoses:  Principal Problem:   Influenza    Discharge Instructions   Allergies as of 07/06/2024   No Known Allergies      Medication List     ASK your doctor about these medications    Accu-Chek Guide Test test strip Generic drug: glucose blood USE THREE TIMES DAILY AS DIRECTED   acetaminophen  650 MG CR tablet Commonly known as: TYLENOL  Take 2-3 tablets by mouth daily as needed for pain.   benzonatate  100 MG capsule Commonly known as: TESSALON  Take 100 mg by mouth 3 (three) times daily as needed.   ciprofloxacin 500 MG tablet Commonly known as: CIPRO Take 500 mg by mouth 2 (two) times daily.   cyclobenzaprine  10 MG tablet Commonly known as: FLEXERIL  Take 1 tablet (10 mg total) by mouth 2 (two) times daily as needed for muscle spasms.   dapagliflozin  propanediol 10 MG Tabs  tablet Commonly known as: Farxiga  Take 1 tablet (10 mg total) by mouth daily.   dapagliflozin  propanediol 10 MG Tabs tablet Commonly known as: FARXIGA  Take 1 tablet (10 mg total) by mouth daily.   Dexcom G7 Sensor Misc Use as directed.   Dexcom G7 Sensor Misc Use as directed. Change sensor every 10 days   fluticasone  50 MCG/ACT nasal  spray Commonly known as: FLONASE  Place 2 sprays into both nostrils daily.   HYDROcodone -acetaminophen  5-325 MG tablet Commonly known as: NORCO/VICODIN Take 1-2 tablets by mouth every 6 (six) hours as needed.   insulin  lispro 100 UNIT/ML injection Commonly known as: HUMALOG  Inject a maximum of 60 Units total into the skin per day.   insulin  lispro 100 UNIT/ML injection Commonly known as: HumaLOG  Inject up to 1 mL (100 Units total) into the skin daily.   Insulin  Lispro Prot & Lispro (75-25) 100 UNIT/ML Kwikpen Commonly known as: HUMALOG  75/25 MIX Inject 36 Units into the skin in the morning and at bedtime.   Insulin  Pen Needle 32G X 4 MM Misc Use to inject into the skin 2 (two) times daily.   ketoconazole  2 % shampoo Commonly known as: NIZORAL  Apply 1 Application topically 2 (two) times a week.   lidocaine  5 % Commonly known as: Lidoderm  Place 1 patch onto the skin daily. Remove & Discard patch within 12 hours or as directed by MD   lidocaine  5 % Commonly known as: Lidoderm  Place 1 patch onto the skin daily. Remove & Discard patch within 12 hours or as directed by MD   losartan  25 MG tablet Commonly known as: COZAAR  Take 1 tablet (25 mg total) by mouth daily.   losartan  25 MG tablet Commonly known as: COZAAR  Take 1 tablet (25 mg total) by mouth daily.   Omnipod 5 DexG7G6 Pods Gen 5 Misc Apply as directed every other day.   oxyCODONE  5 MG immediate release tablet Commonly known as: Roxicodone  Take 1 tablet (5 mg total) by mouth every 4 (four) hours as needed for severe pain (pain score 7-10).   Oxycodone  HCl 10 MG Tabs Take 10 mg by mouth 3 (three) times daily as needed.   Ozempic  (0.25 or 0.5 MG/DOSE) 2 MG/3ML Sopn Generic drug: Semaglutide (0.25 or 0.5MG /DOS) Inject 0.25 mg into the skin once a week. Ask about: Which instructions should I use?   pioglitazone  30 MG tablet Commonly known as: ACTOS  Take 1 tablet (30 mg total) by mouth daily.   rosuvastatin  40  MG tablet Commonly known as: CRESTOR  Take 1 tablet (40 mg total) by mouth daily.   SYSTANE ULTRA OP Place 1 drop into both eyes in the morning, at noon, in the evening, and at bedtime.        Allergies[1]  Consultations: None   Procedures/Studies: CT Angio Chest PE W and/or Wo Contrast Result Date: 07/04/2024 EXAM: CTA CHEST 07/04/2024 07:54:43 PM TECHNIQUE: CTA of the chest was performed without and with the administration of 60 mL of iohexol  (OMNIPAQUE ) 350 MG/ML injection. Multiplanar reformatted images are provided for review. MIP images are provided for review. Automated exposure control, iterative reconstruction, and/or weight based adjustment of the mA/kV was utilized to reduce the radiation dose to as low as reasonably achievable. Motion degraded study. COMPARISON: 09/27/2023 CLINICAL HISTORY: Pulmonary embolism (PE) suspected, high prob. FINDINGS: PULMONARY ARTERIES: Pulmonary arteries are adequately opacified for evaluation. No acute pulmonary embolus. Main pulmonary artery is normal in caliber. MEDIASTINUM: The heart and pericardium demonstrate no acute abnormality. There is no acute  abnormality of the thoracic aorta. LYMPH NODES: Enlargement of the left hilar and AP window lymph nodes. For example, the AP window lymph node measures 2.2 x 2.5 cm (axial 59). Left suprahilar lymph node measures 1.6 x 2 cm (axial 66). There is also a prominent right infrahilar lymph node measuring 1 cm. No axillary lymphadenopathy. LUNGS AND PLEURA: Redemonstrated left upper lobe nodule abutting fissure measuring approximately 7 x 10 mm (axial 58). No focal consolidation or pulmonary edema. No evidence of pleural effusion or pneumothorax. UPPER ABDOMEN: Limited images of the upper abdomen are unremarkable. SOFT TISSUES AND BONES: Thoracic disc with multilevel degenerative disc disease of the visualized spine. No acute soft tissue abnormality. IMPRESSION: 1. No pulmonary embolism, pneumonia, pulmonary  edema, or pleural effusion. 2. Interval development of AP window and left hilar lymphadenopathy. The AP window lymph node is the largest, measuring 2.2x2.5 cm. A prominent right infrahilar lymph node is also present. While these could be reactive, they are worrisome for developing metastatic disease. A nonemergent PET CT should be considered for further characterization. 3. Posterior left upper lobe nodule present measuring 7 x 10 mm . This should also be further evaluated on the aforementioned PET CT to exclude metastatic disease. Electronically signed by: Rogelia Myers MD 07/04/2024 08:41 PM EST RP Workstation: [id number]   DG Chest 2 View Result Date: 07/04/2024 CLINICAL DATA:  Fever, cough and feeling unwell x3 days. EXAM: CHEST - 2 VIEW COMPARISON:  January 05, 2024 FINDINGS: The heart size and mediastinal contours are within normal limits. There is mild peribronchial cuffing with mildly increased suprahilar and infrahilar lung markings noted bilaterally. No focal consolidation, pleural effusion or pneumothorax is identified. The visualized skeletal structures are unremarkable. IMPRESSION: Findings suggestive of viral bronchitis versus reactive airway disease. Electronically Signed   By: Suzen Dials M.D.   On: 07/04/2024 18:05     Subjective: Patient eloped/left AMA  Discharge Exam: Vitals:   07/05/24 1739 07/05/24 2125  BP: (!) 121/57 120/61  Pulse: (!) 103 (!) 103  Resp: 20 19  Temp: 98.4 F (36.9 C) 98.4 F (36.9 C)  SpO2: 97% 91%   Vitals:   07/05/24 1157 07/05/24 1536 07/05/24 1739 07/05/24 2125  BP: (!) 183/70 (!) 132/59 (!) 121/57 120/61  Pulse: (!) 110 (!) 103 (!) 103 (!) 103  Resp:  20 20 19   Temp: 98.4 F (36.9 C) 99.7 F (37.6 C) 98.4 F (36.9 C) 98.4 F (36.9 C)  TempSrc:      SpO2: 95% 97% 97% 91%  Weight:      Height:         The results of significant diagnostics from this hospitalization (including imaging, microbiology, ancillary and laboratory) are  listed below for reference.     Microbiology: Recent Results (from the past 240 hours)  Resp panel by RT-PCR (RSV, Flu A&B, Covid) Anterior Nasal Swab     Status: Abnormal   Collection Time: 07/04/24  5:11 PM   Specimen: Anterior Nasal Swab  Result Value Ref Range Status   SARS Coronavirus 2 by RT PCR NEGATIVE NEGATIVE Final    Comment: (NOTE) SARS-CoV-2 target nucleic acids are NOT DETECTED.  The SARS-CoV-2 RNA is generally detectable in upper respiratory specimens during the acute phase of infection. The lowest concentration of SARS-CoV-2 viral copies this assay can detect is 138 copies/mL. A negative result does not preclude SARS-Cov-2 infection and should not be used as the sole basis for treatment or other patient management decisions. A negative result may  occur with  improper specimen collection/handling, submission of specimen other than nasopharyngeal swab, presence of viral mutation(s) within the areas targeted by this assay, and inadequate number of viral copies(<138 copies/mL). A negative result must be combined with clinical observations, patient history, and epidemiological information. The expected result is Negative.  Fact Sheet for Patients:  bloggercourse.com  Fact Sheet for Healthcare Providers:  seriousbroker.it  This test is no t yet approved or cleared by the [place]  FDA and  has been authorized for detection and/or diagnosis of SARS-CoV-2 by FDA under an Emergency Use Authorization (EUA). This EUA will remain  in effect (meaning this test can be used) for the duration of the COVID-19 declaration under Section 564(b)(1) of the Act, 21 U.S.C.section 360bbb-3(b)(1), unless the authorization is terminated  or revoked sooner.       Influenza A by PCR POSITIVE (A) NEGATIVE Final   Influenza B by PCR NEGATIVE NEGATIVE Final    Comment: (NOTE) The Xpert Xpress SARS-CoV-2/FLU/RSV plus assay is intended  as an aid in the diagnosis of influenza from Nasopharyngeal swab specimens and should not be used as a sole basis for treatment. Nasal washings and aspirates are unacceptable for Xpert Xpress SARS-CoV-2/FLU/RSV testing.  Fact Sheet for Patients: bloggercourse.com  Fact Sheet for Healthcare Providers: seriousbroker.it  This test is not yet approved or cleared by the [place]  FDA and has been authorized for detection and/or diagnosis of SARS-CoV-2 by FDA under an Emergency Use Authorization (EUA). This EUA will remain in effect (meaning this test can be used) for the duration of the COVID-19 declaration under Section 564(b)(1) of the Act, 21 U.S.C. section 360bbb-3(b)(1), unless the authorization is terminated or revoked.     Resp Syncytial Virus by PCR NEGATIVE NEGATIVE Final    Comment: (NOTE) Fact Sheet for Patients: bloggercourse.com  Fact Sheet for Healthcare Providers: seriousbroker.it  This test is not yet approved or cleared by the [place]  FDA and has been authorized for detection and/or diagnosis of SARS-CoV-2 by FDA under an Emergency Use Authorization (EUA). This EUA will remain in effect (meaning this test can be used) for the duration of the COVID-19 declaration under Section 564(b)(1) of the Act, 21 U.S.C. section 360bbb-3(b)(1), unless the authorization is terminated or revoked.  Performed at [facility], [address] [place], [place] [address]      Labs: BNP (last 3 results) No results for input(s): BNP in the last 8760 hours. Basic Metabolic Panel: Recent Labs  Lab 07/04/24 1753 07/05/24 0411  NA 134* 135  K 4.8 4.8  CL 98 98  CO2 25 25  GLUCOSE 176* 188*  BUN 27* 24*  CREATININE 1.72* 1.77*  CALCIUM  9.7 9.5   Liver Function Tests: Recent Labs  Lab 07/04/24 1753 07/05/24 0411  AST 29 26  ALT 73* 56*   ALKPHOS 83 71  BILITOT 0.6 0.6  PROT 8.1 7.3  ALBUMIN 4.6 4.3   No results for input(s): LIPASE, AMYLASE in the last 168 hours. No results for input(s): AMMONIA in the last 168 hours. CBC: Recent Labs  Lab 07/04/24 1753 07/05/24 0411  WBC 10.0 9.8  NEUTROABS 8.6*  --   HGB 16.4 15.1  HCT 51.7 48.0  MCV 89.1 89.7  PLT 173 152   Cardiac Enzymes: No results for input(s): CKTOTAL, CKMB, CKMBINDEX, TROPONINI in the last 168 hours. BNP: Invalid input(s): POCBNP CBG: Recent Labs  Lab 07/05/24 0746 07/05/24 1159 07/05/24 1734 07/05/24 2126  GLUCAP 167* 173* 171* 193*  D-Dimer No results for input(s): DDIMER in the last 72 hours. Hgb A1c Recent Labs    07/05/24 0411  HGBA1C 8.5*   Lipid Profile No results for input(s): CHOL, HDL, LDLCALC, TRIG, CHOLHDL, LDLDIRECT in the last 72 hours. Thyroid  function studies No results for input(s): TSH, T4TOTAL, T3FREE, THYROIDAB in the last 72 hours.  Invalid input(s): FREET3 Anemia work up No results for input(s): VITAMINB12, FOLATE, FERRITIN, TIBC, IRON, RETICCTPCT in the last 72 hours. Urinalysis    Component Value Date/Time   COLORURINE YELLOW 03/16/2024 0819   APPEARANCEUR HAZY (A) 03/16/2024 0819   LABSPEC 1.018 03/16/2024 0819   PHURINE 5.0 03/16/2024 0819   GLUCOSEU >=500 (A) 03/16/2024 0819   HGBUR MODERATE (A) 03/16/2024 0819   BILIRUBINUR NEGATIVE 03/16/2024 0819   BILIRUBINUR negative 02/02/2024 1206   BILIRUBINUR neg 08/31/2015 1055   KETONESUR NEGATIVE 03/16/2024 0819   PROTEINUR 30 (A) 03/16/2024 0819   UROBILINOGEN 0.2 02/02/2024 1206   UROBILINOGEN 0.2 09/01/2013 0845   NITRITE NEGATIVE 03/16/2024 0819   LEUKOCYTESUR MODERATE (A) 03/16/2024 0819   Sepsis Labs Recent Labs  Lab 07/04/24 1753 07/05/24 0411  WBC 10.0 9.8   Microbiology Recent Results (from the past 240 hours)  Resp panel by RT-PCR (RSV, Flu A&B, Covid) Anterior Nasal Swab      Status: Abnormal   Collection Time: 07/04/24  5:11 PM   Specimen: Anterior Nasal Swab  Result Value Ref Range Status   SARS Coronavirus 2 by RT PCR NEGATIVE NEGATIVE Final    Comment: (NOTE) SARS-CoV-2 target nucleic acids are NOT DETECTED.  The SARS-CoV-2 RNA is generally detectable in upper respiratory specimens during the acute phase of infection. The lowest concentration of SARS-CoV-2 viral copies this assay can detect is 138 copies/mL. A negative result does not preclude SARS-Cov-2 infection and should not be used as the sole basis for treatment or other patient management decisions. A negative result may occur with  improper specimen collection/handling, submission of specimen other than nasopharyngeal swab, presence of viral mutation(s) within the areas targeted by this assay, and inadequate number of viral copies(<138 copies/mL). A negative result must be combined with clinical observations, patient history, and epidemiological information. The expected result is Negative.  Fact Sheet for Patients:  bloggercourse.com  Fact Sheet for Healthcare Providers:  seriousbroker.it  This test is no t yet approved or cleared by the [place]  FDA and  has been authorized for detection and/or diagnosis of SARS-CoV-2 by FDA under an Emergency Use Authorization (EUA). This EUA will remain  in effect (meaning this test can be used) for the duration of the COVID-19 declaration under Section 564(b)(1) of the Act, 21 U.S.C.section 360bbb-3(b)(1), unless the authorization is terminated  or revoked sooner.       Influenza A by PCR POSITIVE (A) NEGATIVE Final   Influenza B by PCR NEGATIVE NEGATIVE Final    Comment: (NOTE) The Xpert Xpress SARS-CoV-2/FLU/RSV plus assay is intended as an aid in the diagnosis of influenza from Nasopharyngeal swab specimens and should not be used as a sole basis for treatment. Nasal washings and aspirates  are unacceptable for Xpert Xpress SARS-CoV-2/FLU/RSV testing.  Fact Sheet for Patients: bloggercourse.com  Fact Sheet for Healthcare Providers: seriousbroker.it  This test is not yet approved or cleared by the [place]  FDA and has been authorized for detection and/or diagnosis of SARS-CoV-2 by FDA under an Emergency Use Authorization (EUA). This EUA will remain in effect (meaning this test can be used) for the duration of the COVID-19  declaration under Section 564(b)(1) of the Act, 21 U.S.C. section 360bbb-3(b)(1), unless the authorization is terminated or revoked.     Resp Syncytial Virus by PCR NEGATIVE NEGATIVE Final    Comment: (NOTE) Fact Sheet for Patients: bloggercourse.com  Fact Sheet for Healthcare Providers: seriousbroker.it  This test is not yet approved or cleared by the [place]  FDA and has been authorized for detection and/or diagnosis of SARS-CoV-2 by FDA under an Emergency Use Authorization (EUA). This EUA will remain in effect (meaning this test can be used) for the duration of the COVID-19 declaration under Section 564(b)(1) of the Act, 21 U.S.C. section 360bbb-3(b)(1), unless the authorization is terminated or revoked.  Performed at [facility], [address], [place], [place] [address]      Time coordinating discharge: Over 30 minutes  SIGNED:   Camellia PARAS Lyne Khurana, DO  [facility] 07/06/2024, 11:06 AM     [1] No Known Allergies  "

## 2024-07-06 NOTE — ED Triage Notes (Signed)
 Patient states he is here for pills for the flu. He states he left the hospital AMA today.

## 2024-07-06 NOTE — Progress Notes (Signed)
 NT notified CN that pt was not in his room when she attempted to take pt's vital signs. CN searched all pt rooms and pt not on the unit. Primary nurse stated that pt was last seen about 12/26 @ 2300. CN notified AC, security, and spoke to NP on call and provided an update of pt's status. NT attempted to contact pt via phone but pt did not answer. NT attempted to contact pt's sister on contact list and left a voicemail message. Security stated that laboratory tech saw pt and he asked how to exit the building about 12/27 @ 0010. CN will discharge pt as AMA.

## 2024-07-06 NOTE — Discharge Instructions (Addendum)
 Start taking Tamiflu  twice daily for 4 days for continued treatment of the flu. Please follow-up with your primary care provider for repeat imaging based on your CT results done in the hospital. If you develop worsening shortness of breath, severe chest pain, persistent fevers, persistently high blood sugar readings, severe weakness, or passing out please seek immediate medical treatment in the emergency department.

## 2024-07-06 NOTE — Progress Notes (Signed)
 Patient left without notifying staff on floor and no discharge order; observed IV out at table bedside-no prior distress on previous rounds-patient talked to NT and mentioned he was going home in the morning. Notified security, [facility] and provider-RN will attempt to notify family member.

## 2024-07-15 ENCOUNTER — Other Ambulatory Visit (HOSPITAL_COMMUNITY): Payer: Self-pay | Admitting: Physician Assistant

## 2024-07-15 DIAGNOSIS — R590 Localized enlarged lymph nodes: Secondary | ICD-10-CM

## 2024-07-15 DIAGNOSIS — R911 Solitary pulmonary nodule: Secondary | ICD-10-CM

## 2024-07-17 ENCOUNTER — Other Ambulatory Visit (HOSPITAL_COMMUNITY): Payer: Self-pay

## 2024-07-17 MED FILL — Semaglutide Soln Pen-inj 0.25 or 0.5 MG/DOSE (2 MG/3ML): SUBCUTANEOUS | 56 days supply | Qty: 3 | Fill #1 | Status: CN

## 2024-07-18 ENCOUNTER — Other Ambulatory Visit (HOSPITAL_BASED_OUTPATIENT_CLINIC_OR_DEPARTMENT_OTHER): Payer: Self-pay

## 2024-07-18 ENCOUNTER — Other Ambulatory Visit: Payer: Self-pay

## 2024-07-23 ENCOUNTER — Other Ambulatory Visit: Payer: Self-pay

## 2024-07-24 ENCOUNTER — Other Ambulatory Visit: Payer: Self-pay

## 2024-07-24 ENCOUNTER — Other Ambulatory Visit (HOSPITAL_COMMUNITY): Payer: Self-pay

## 2024-07-24 MED FILL — Semaglutide Soln Pen-inj 0.25 or 0.5 MG/DOSE (2 MG/3ML): SUBCUTANEOUS | 56 days supply | Qty: 3 | Fill #1 | Status: AC

## 2024-07-25 NOTE — Progress Notes (Signed)
 "    Name: Ryan Farmer  Age/ Sex: 69 y.o., male   MRN/ DOB: [id number], 03/24/1956     PCP: System, Provider Not In   Reason for Endocrinology Evaluation: Type 2 Diabetes Mellitus  Initial Endocrine Consultative Visit: 04/15/2019    PATIENT IDENTIFIER: [name] is a 69 y.o. male with a past medical history of CAD, T2DM, Hyperlipidemia and polysubstance abuse, s/p left nephrectomy due to [facility] 05/2022. The patient has followed with Endocrinology clinic since 04/15/2019 for consultative assistance with management of his diabetes.  DIABETIC HISTORY:  Ryan Farmer was diagnosed with DM in 2011. He has been on SU in the past, has been on metformin  since diagnosis, insulin  started in 2018. His hemoglobin A1c has ranged from 6.4% in 2017, peaking at 12.6% in 2020.  On his initial visit to our clinic he was on Lantus  and Metformin  with an A1c 10.4% . We stopped lantus  and started a novolog  Mix.    We attempted MDI regimen in 2022 but the patient did not like that regimen as he felt the insulin  works were stronger and he was switched back July 2023  Lives with sister, on disability ( can not read or write)    Discontinued metformin  03/2023 based on his request due to skepticism about side effects Started pioglitazone  03/2023   He was started on the OmniPod 11/2023  Started Ozempic  11/2023  SUBJECTIVE:   During the last visit (03/26/2024): A1c 8.2%    Today (07/26/2024): Ryan Farmer is here for a follow up on diabetes management.  Checks glucose occasionally , no hypoglycemia  Patient was treated for influenza in December, 2025 He has recovered well, has a follow up with [facility] on the 20th for further evaluation   No cough  Has minimal SOB  No recent  No nausea  No constipation    This patient with type 2 diabetes is treated with OmniPod (insulin  pump). During the visit the pump basal and bolus doses were reviewed including carb/insulin  rations and supplemental doses.  The clinical list was updated. The glucose meter download was reviewed in detail to determine if the current pump settings are providing the best glycemic control without excessive hypoglycemia.  Pump and meter download:     Pump   OmniPod Settings   Insulin  type   Humalog    Basal rate       0000 1.15              I:C ratio       0000 1:1    Enter #10 g with each meal               Sensitivity       0000  25      Goal       0000  130           Type & Model of Pump: omnipod Insulin  Type: Currently using Humalog .  Body mass index is 32.14 kg/m.  PUMP STATISTICS:                HOME DIABETES REGIMEN:  Humalog   Farxiga  10 mg, 1 tablet daily  Ozempic  0.5 mg weekly Rosuvastatin  40 mg daily Losartan  25 mg daily  CONTINUOUS GLUCOSE MONITORING RECORD INTERPRETATION              DIABETIC COMPLICATIONS: Microvascular complications:  Neuropathy  Denies: CKD , retinopathy  Last eye exam: Completed > 1    Macrovascular complications:  CAD  Denies: PVD, CVA      HISTORY:  Past Medical History:  Past Medical History:  Diagnosis Date   Arthritis    All over   CAD (coronary artery disease), native coronary artery, minimal by cardiac cath 01/08/19 01/09/2019   Chronic pain syndrome    Diabetes mellitus    History of cocaine  use    History of kidney stones    Hypercholesteremia    Hypertension    Low back pain    Lumbar radiculopathy    Right leg pain    Seborrheic dermatitis of scalp    Shingles    Past Surgical History:  Past Surgical History:  Procedure Laterality Date   CIRCUMCISION N/A 03/16/2018   Procedure: CIRCUMCISION ADULT WITH PENILE BLOCK;  Surgeon: Alvaro Hummer, MD;  Location: [facility];  Service: Urology;  Laterality: N/A;  45 MINS   KNEE ARTHROSCOPY Bilateral    LEFT HEART CATH AND CORONARY ANGIOGRAPHY N/A 01/08/2019   Procedure: LEFT HEART CATH AND CORONARY ANGIOGRAPHY;  Surgeon: Burnard Debby LABOR, MD;  Location: [facility];  Service: Cardiovascular;  Laterality: N/A;   LYMPH NODE DISSECTION Left 05/13/2022   Procedure: RETROPERITONEAL LYMPH NODE DISSECTIONLYMPH NODE DISSECTION;  Surgeon: Alvaro Hummer, MD;  Location: [facility];  Service: Urology;  Laterality: Left;   ROBOT ASSISTED LAPAROSCOPIC NEPHRECTOMY Left 05/13/2022   Procedure: XI ROBOTIC ASSISTED LAPAROSCOPIC NEPHRECTOMY;  Surgeon: Alvaro Hummer, MD;  Location: [facility];  Service: Urology;  Laterality: Left;  3 HRS   Social History:  reports that he has been smoking cigarettes. He has a 50 pack-year smoking history. He has quit using smokeless tobacco.  His smokeless tobacco use included chew. He reports that he does not currently use alcohol. He reports that he does not currently use drugs after having used the following drugs: Marijuana and Cocaine . Family History:  Family History  Problem Relation Age of Onset   Cancer Mother    Stroke Father    Heart disease Father    Diabetes Father      HOME MEDICATIONS: Allergies as of 07/26/2024   No Known Allergies      Medication List        Accurate as of July 26, 2024  8:17 AM. If you have any questions, ask your nurse or doctor.          Accu-Chek Guide Test test strip Generic drug: glucose blood USE THREE TIMES DAILY AS DIRECTED   acetaminophen  650 MG CR tablet Commonly known as: TYLENOL  Take 2-3 tablets by mouth daily as needed for pain.   benzonatate  100 MG capsule Commonly known as: TESSALON  Take 100 mg by mouth 3 (three) times daily as needed.   ciprofloxacin 500 MG tablet Commonly known as: CIPRO Take 500 mg by mouth 2 (two) times daily.   cyclobenzaprine  10 MG tablet Commonly known as: FLEXERIL  Take 1 tablet (10 mg total) by mouth 2 (two) times daily as needed for muscle spasms.   dapagliflozin  propanediol 10 MG Tabs tablet Commonly known as: Farxiga  Take 1 tablet (10 mg total) by mouth daily.   dapagliflozin  propanediol 10 MG Tabs  tablet Commonly known as: FARXIGA  Take 1 tablet (10 mg total) by mouth daily.   Dexcom G7 Sensor Misc Use as directed.   Dexcom G7 Sensor Misc Use as directed. Change sensor every 10 days   fluticasone  50 MCG/ACT nasal spray Commonly known as: FLONASE  Place 2 sprays into both nostrils daily.   HYDROcodone -acetaminophen  5-325 MG tablet  Commonly known as: NORCO/VICODIN Take 1-2 tablets by mouth every 6 (six) hours as needed.   insulin  lispro 100 UNIT/ML injection Commonly known as: HUMALOG  Inject a maximum of 60 Units total into the skin per day.   insulin  lispro 100 UNIT/ML injection Commonly known as: HumaLOG  Inject up to 1 mL (100 Units total) into the skin daily.   Insulin  Lispro Prot & Lispro (75-25) 100 UNIT/ML Kwikpen Commonly known as: HUMALOG  75/25 MIX Inject 36 Units into the skin in the morning and at bedtime.   Insulin  Pen Needle 32G X 4 MM Misc Use to inject into the skin 2 (two) times daily.   ketoconazole  2 % shampoo Commonly known as: NIZORAL  Apply 1 Application topically 2 (two) times a week.   lidocaine  5 % Commonly known as: Lidoderm  Place 1 patch onto the skin daily. Remove & Discard patch within 12 hours or as directed by MD   lidocaine  5 % Commonly known as: Lidoderm  Place 1 patch onto the skin daily. Remove & Discard patch within 12 hours or as directed by MD   losartan  25 MG tablet Commonly known as: COZAAR  Take 1 tablet (25 mg total) by mouth daily.   losartan  25 MG tablet Commonly known as: COZAAR  Take 1 tablet (25 mg total) by mouth daily.   Omnipod 5 DexG7G6 Pods Gen 5 Misc Apply as directed every other day.   oxyCODONE  5 MG immediate release tablet Commonly known as: Roxicodone  Take 1 tablet (5 mg total) by mouth every 4 (four) hours as needed for severe pain (pain score 7-10).   Oxycodone  HCl 10 MG Tabs Take 10 mg by mouth 3 (three) times daily as needed.   Ozempic  (0.25 or 0.5 MG/DOSE) 2 MG/3ML Sopn Generic drug:  Semaglutide (0.25 or 0.5MG /DOS) Inject 0.25 mg into the skin once a week.   pioglitazone  30 MG tablet Commonly known as: ACTOS  Take 1 tablet (30 mg total) by mouth daily.   rosuvastatin  40 MG tablet Commonly known as: CRESTOR  Take 1 tablet (40 mg total) by mouth daily.   SYSTANE ULTRA OP Place 1 drop into both eyes in the morning, at noon, in the evening, and at bedtime.         OBJECTIVE:   Vital Signs: BP (!) 144/80   Ht 6' (1.829 m)   Wt 237 lb (107.5 kg)   BMI 32.14 kg/m   Wt Readings from Last 3 Encounters:  07/26/24 237 lb (107.5 kg)  07/05/24 220 lb 14.4 oz (100.2 kg)  03/26/24 223 lb (101.2 kg)     Exam: General: Pt appears well and is in NAD  Lungs: Clear with good BS bilat   Heart: RRR   Neuro: MS is good with appropriate affect, pt is alert and Ox3      DM foot exam: 03/26/2024   The skin of the feet is without sores or ulcerations. The pedal pulses are 1+ on right and 1+ on left. The sensation is decreased  at the left heel to a screening 5.07, 10 gram monofilament    DATA REVIEWED:  Lab Results  Component Value Date   HGBA1C 8.5 (H) 07/05/2024   HGBA1C 8.2 (A) 03/26/2024   HGBA1C 8.5 (A) 01/01/2024    Latest Reference Range & Units 07/05/24 04:11  Sodium 135 - 145 mmol/L 135  Potassium 3.5 - 5.1 mmol/L 4.8  Chloride 98 - 111 mmol/L 98  CO2 22 - 32 mmol/L 25  Glucose 70 - 99 mg/dL 811 (H)  Mean Plasma  Glucose mg/dL 802.74  BUN 8 - 23 mg/dL 24 (H)  Creatinine 9.38 - 1.24 mg/dL 8.22 (H)  Calcium  8.9 - 10.3 mg/dL 9.5  Anion gap 5 - 15  13  Alkaline Phosphatase 38 - 126 U/L 71  Albumin 3.5 - 5.0 g/dL 4.3  AST 15 - 41 U/L 26  ALT 0 - 44 U/L 56 (H)  Total Protein 6.5 - 8.1 g/dL 7.3  Total Bilirubin 0.0 - 1.2 mg/dL 0.6  GFR, Estimated >39 mL/min 42 (L)    ASSESSMENT / PLAN / RECOMMENDATIONS:   1) Type 2  Diabetes Mellitus, poorly control, With Neuropathy, CKD III  and CAD  complications - Most recent A1c of 8.5% . Goal A1c < 7.5 %.     -A1c remains above goal but stable - Omnipod is in manual mode as his phone is not compatible with dexcom app use, nor does he want to add any apps to his phone.  Patient continues to enter glucose data from CGM into the pump - Pt will increase CHO per meal and will increase basal rate as below  - He doesn't want to be on pioglitazone  due to cardiac side effects   MEDICATIONS:  Humalog  per insulin  pump Take Farxiga  10 mg, 1 tablet daily  Continue Ozempic  0.5 mg weekly    Pump   OmniPod Settings   Insulin  type   Humalog    Basal rate       0000 1.30              I:C ratio       0000 1:1    Enter #10 g with each meal               Sensitivity       0000  25      Goal       0000  130         EDUCATION / INSTRUCTIONS: BG monitoring instructions: Patient is instructed to check his blood sugars 2 times a day, fasting and supper . Call [facility] if: BG persistently < 70 I reviewed the Rule of 15 for the treatment of hypoglycemia in detail with the patient. Literature supplied.  2) Diabetic complications:  Eye: Does not have known diabetic retinopathy. Neuro/ Feet: Does  have known diabetic peripheral neuropathy. Renal: Patient does not have known baseline CKD. He is on an ACEI/ARB at present   3) Hypertriglyceridemia:   -I had switched atorvastatin  to rosuvastatin  with a triglyceride of 720 Mg/DL in 0/7975 - Will continue rosuvastatin   Medication  Take rosuvastatin  40 mg daily    4) CKD III/Microalbuminuria :  -He is s/p nephrectomy due to [facility] 05/2022 -Started ARB 03/2023   Medication Take losartan  25 mg daily   5) Abnormal CT scan of the chest:  - I did recommend referral to pulmonary based on his most recent CT imaging, patient declined a referral to pulmonary at this time he would like to follow-up with his new PCP through [facility] - I did review CT scan of the chest with lymphadenopathy and the need to investigate this  further - Patient states he will contact my office if he is unable to follow-up with his PCP.  Patient also states if this is a malignancy he would not wish to treated and will make arrangements as necessary as he has outlived his father.   F/U in 4 months    Signed electronically by: Stefano Redgie Butts, MD  [facility]  [facility] [address] [place], [place] [address] Phone: [phone] FAX: [phone]   CC: System, Provider Not In No address on file Phone: None  Fax: None  Return to Endocrinology clinic as below: Future Appointments  Date Time Provider Department Center  09/11/2024  8:10 AM Alvia Norleen BIRCH, MD [facility] None       "

## 2024-07-26 ENCOUNTER — Encounter: Payer: Self-pay | Admitting: Internal Medicine

## 2024-07-26 ENCOUNTER — Ambulatory Visit: Admitting: Internal Medicine

## 2024-07-26 VITALS — BP 144/80 | Ht 72.0 in | Wt 237.0 lb

## 2024-07-26 DIAGNOSIS — E1165 Type 2 diabetes mellitus with hyperglycemia: Secondary | ICD-10-CM

## 2024-07-26 DIAGNOSIS — E1142 Type 2 diabetes mellitus with diabetic polyneuropathy: Secondary | ICD-10-CM

## 2024-07-26 DIAGNOSIS — Z794 Long term (current) use of insulin: Secondary | ICD-10-CM

## 2024-07-26 DIAGNOSIS — E785 Hyperlipidemia, unspecified: Secondary | ICD-10-CM

## 2024-07-26 DIAGNOSIS — N1832 Chronic kidney disease, stage 3b: Secondary | ICD-10-CM

## 2024-07-26 DIAGNOSIS — E1122 Type 2 diabetes mellitus with diabetic chronic kidney disease: Secondary | ICD-10-CM | POA: Diagnosis not present

## 2024-07-26 DIAGNOSIS — R809 Proteinuria, unspecified: Secondary | ICD-10-CM

## 2024-07-26 DIAGNOSIS — E1129 Type 2 diabetes mellitus with other diabetic kidney complication: Secondary | ICD-10-CM | POA: Diagnosis not present

## 2024-07-26 MED ORDER — INSULIN LISPRO 100 UNIT/ML IJ SOLN: 150.0000 [IU] | Freq: Every day | INTRAMUSCULAR | 3 refills | Status: AC

## 2024-07-26 MED ORDER — OZEMPIC (0.25 OR 0.5 MG/DOSE) 2 MG/3ML ~~LOC~~ SOPN: 0.2500 mg | PEN_INJECTOR | SUBCUTANEOUS | 1 refills | Status: AC

## 2024-07-26 NOTE — Patient Instructions (Signed)

## 2024-09-11 ENCOUNTER — Encounter (INDEPENDENT_AMBULATORY_CARE_PROVIDER_SITE_OTHER): Admitting: Ophthalmology

## 2024-11-22 ENCOUNTER — Ambulatory Visit: Admitting: Internal Medicine
